# Patient Record
Sex: Female | Born: 1975
Health system: Southern US, Community
[De-identification: ages and names within clinical notes are randomized; demographics above are authoritative.]

## PROBLEM LIST (undated history)

## (undated) ENCOUNTER — Ambulatory Visit: Admission: EM | Payer: POS

## (undated) DIAGNOSIS — N870 Mild cervical dysplasia: Secondary | ICD-10-CM

## (undated) DIAGNOSIS — N051 Unspecified nephritic syndrome with focal and segmental glomerular lesions: Secondary | ICD-10-CM

## (undated) DIAGNOSIS — Z9889 Other specified postprocedural states: Secondary | ICD-10-CM

## (undated) DIAGNOSIS — N2581 Secondary hyperparathyroidism of renal origin: Secondary | ICD-10-CM

## (undated) DIAGNOSIS — N189 Chronic kidney disease, unspecified: Secondary | ICD-10-CM

## (undated) DIAGNOSIS — B20 Human immunodeficiency virus [HIV] disease: Secondary | ICD-10-CM

## (undated) DIAGNOSIS — G500 Trigeminal neuralgia: Secondary | ICD-10-CM

## (undated) DIAGNOSIS — Z21 Asymptomatic human immunodeficiency virus [HIV] infection status: Secondary | ICD-10-CM

## (undated) DIAGNOSIS — D631 Anemia in chronic kidney disease: Secondary | ICD-10-CM

## (undated) DIAGNOSIS — I10 Essential (primary) hypertension: Secondary | ICD-10-CM

## (undated) DIAGNOSIS — N261 Atrophy of kidney (terminal): Secondary | ICD-10-CM

## (undated) DIAGNOSIS — N901 Moderate vulvar dysplasia: Secondary | ICD-10-CM

## (undated) DIAGNOSIS — R002 Palpitations: Secondary | ICD-10-CM

## (undated) DIAGNOSIS — B370 Candidal stomatitis: Secondary | ICD-10-CM

## (undated) DIAGNOSIS — R000 Tachycardia, unspecified: Secondary | ICD-10-CM

## (undated) DIAGNOSIS — N186 End stage renal disease: Secondary | ICD-10-CM

## (undated) DIAGNOSIS — Z87412 Personal history of vulvar dysplasia: Secondary | ICD-10-CM

## (undated) HISTORY — PX: ANKLE FRACTURE SURGERY: SHX122

## (undated) HISTORY — PX: CERVIX SURGERY: SHX593

## (undated) HISTORY — DX: Chronic kidney disease, unspecified: N18.9

## (undated) HISTORY — DX: Trigeminal neuralgia: G50.0

## (undated) HISTORY — DX: Candidal stomatitis: B37.0

---

## 2003-08-07 HISTORY — PX: CERVICAL BIOPSY  W/ LOOP ELECTRODE EXCISION: SUR135

## 2010-08-06 HISTORY — PX: ORIF ANKLE FRACTURE: SUR919

## 2011-08-07 HISTORY — PX: VULVA SURGERY: SHX837

## 2018-09-21 ENCOUNTER — Emergency Department (HOSPITAL_COMMUNITY)
Admission: EM | Admit: 2018-09-21 | Discharge: 2018-09-21 | Disposition: A | Discharge status: Home / Self Care | Payer: Medicare Other | Attending: Emergency Medicine | Admitting: Emergency Medicine

## 2018-09-21 ENCOUNTER — Encounter (HOSPITAL_COMMUNITY): Payer: Self-pay | Admitting: Emergency Medicine

## 2018-09-21 ENCOUNTER — Other Ambulatory Visit: Payer: Self-pay

## 2018-09-21 DIAGNOSIS — G500 Trigeminal neuralgia: Secondary | ICD-10-CM | POA: Insufficient documentation

## 2018-09-21 DIAGNOSIS — R51 Headache: Secondary | ICD-10-CM | POA: Diagnosis present

## 2018-09-21 MED ORDER — CARBAMAZEPINE 200 MG PO TABS
200.0000 mg | ORAL_TABLET | Freq: Once | ORAL | Status: AC
  Administered 2018-09-21: 200 mg via ORAL
  Filled 2018-09-21: qty 1

## 2018-09-21 MED ORDER — CARBAMAZEPINE ER 100 MG PO TB12: 100.0000 mg | ORAL_TABLET | Freq: Two times a day (BID) | ORAL | 0 refills | Status: DC

## 2018-09-21 MED ORDER — OXYCODONE-ACETAMINOPHEN 5-325 MG PO TABS
1.0000 | ORAL_TABLET | Freq: Once | ORAL | Status: AC
  Administered 2018-09-21: 1 via ORAL
  Filled 2018-09-21: qty 1

## 2018-09-21 MED ORDER — IBUPROFEN 800 MG PO TABS: 800.0000 mg | ORAL_TABLET | Freq: Four times a day (QID) | ORAL | 0 refills | Status: DC | PRN

## 2018-09-21 NOTE — ED Provider Notes (Signed)
[facility] EMERGENCY DEPARTMENT Provider Note   CSN: [id number] Arrival date & time: 09/21/18  0131     History   Chief Complaint Chief Complaint  Patient presents with  . Facial Pain  . Otalgia    HPI Brooke Baldwin is a 43 y.o. female.  Patient presents to the emergency department for evaluation of right-sided facial pain.  Patient reports intermittent pain.  She states that she broke her jaw 10 years ago and has had intermittent pains on the right side of her face since then, but over the last couple of days she has been having severe pain.  Pain comes and goes.  When it happens it is like an electric shock that shoots down her face.  When pain is present it is 10 out of 10.  She has not identified anything that causes the pain, although applying Chapstick earlier caused the pain to happen.  Pain goes up into the right ear and down the right side of her face.  She does not have any teeth, no dental problems.  No hearing change.  No visual change.     History reviewed. No pertinent past medical history.  There are no active problems to display for this patient.   History reviewed. No pertinent surgical history.   OB History   No obstetric history on file.      Home Medications    Prior to Admission medications   Medication Sig Start Date End Date Taking? Authorizing Provider  carbamazepine (TEGRETOL-XR) 100 MG 12 hr tablet Take 1 tablet (100 mg total) by mouth 2 (two) times daily. 09/21/18   Orpah Greek, MD  ibuprofen (ADVIL,MOTRIN) 800 MG tablet Take 1 tablet (800 mg total) by mouth every 6 (six) hours as needed for moderate pain. 09/21/18   Orpah Greek, MD    Family History No family history on file.  Social History Social History   Tobacco Use  . Smoking status: Never Smoker  . Smokeless tobacco: Never Used  Substance Use Topics  . Alcohol use: Not Currently  . Drug use: Not Currently     Allergies     Patient has no allergy information on record.   Review of Systems Review of Systems  HENT: Positive for ear pain. Negative for facial swelling.   All other systems reviewed and are negative.    Physical Exam Updated Vital Signs BP (!) 188/112 (BP Location: Left Arm)   Pulse (!) 104   Temp 98.6 F (37 C) (Oral)   Resp 18   SpO2 93%   Physical Exam Vitals signs and nursing note reviewed.  Constitutional:      General: She is not in acute distress.    Appearance: Normal appearance. She is well-developed.  HENT:     Head: Normocephalic and atraumatic.     Right Ear: Hearing, tympanic membrane and ear canal normal.     Left Ear: Hearing normal.     Nose: Nose normal.  Eyes:     Conjunctiva/sclera: Conjunctivae normal.     Pupils: Pupils are equal, round, and reactive to light.  Neck:     Musculoskeletal: Normal range of motion and neck supple.  Cardiovascular:     Rate and Rhythm: Regular rhythm.     Heart sounds: S1 normal and S2 normal. No murmur. No friction rub. No gallop.   Pulmonary:     Effort: Pulmonary effort is normal. No respiratory distress.     Breath sounds: Normal  breath sounds.  Chest:     Chest wall: No tenderness.  Abdominal:     General: Bowel sounds are normal.     Palpations: Abdomen is soft.     Tenderness: There is no abdominal tenderness. There is no guarding or rebound. Negative signs include Murphy's sign and McBurney's sign.     Hernia: No hernia is present.  Musculoskeletal: Normal range of motion.  Skin:    General: Skin is warm and dry.     Findings: No rash.  Neurological:     Mental Status: She is alert and oriented to person, place, and time.     GCS: GCS eye subscore is 4. GCS verbal subscore is 5. GCS motor subscore is 6.     Cranial Nerves: No cranial nerve deficit.     Sensory: No sensory deficit.     Coordination: Coordination normal.  Psychiatric:        Speech: Speech normal.        Behavior: Behavior normal.         Thought Content: Thought content normal.      ED Treatments / Results  Labs (all labs ordered are listed, but only abnormal results are displayed) Labs Reviewed - No data to display  EKG None  Radiology No results found.  Procedures Procedures (including critical care time)  Medications Ordered in ED Medications  carbamazepine (TEGRETOL) tablet 200 mg (has no administration in time range)  oxyCODONE-acetaminophen (PERCOCET/ROXICET) 5-325 MG per tablet 1 tablet (has no administration in time range)     Initial Impression / Assessment and Plan / ED Course  I have reviewed the triage vital signs and the nursing notes.  Pertinent labs & imaging results that were available during my care of the patient were reviewed by me and considered in my medical decision making (see chart for details).     Patient presents to the emergency department for evaluation of facial pain.  Patient is experiencing intermittent sharp, stabbing lancinating pain.  There is no associated facial swelling.  She does not have any overlying skin changes or rash.  She does report ear pain but TM and canal are normal, no vesicles noted on exam.  No evidence of dental etiology.  No lymphadenopathy.  Symptoms are most consistent with a trigeminal neuralgia.  Treat as neuralgia, follow-up with neurology.  Final Clinical Impressions(s) / ED Diagnoses   Final diagnoses:  Trigeminal neuralgia of right side of face    ED Discharge Orders         Ordered    carbamazepine (TEGRETOL-XR) 100 MG 12 hr tablet  2 times daily     09/21/18 0332    ibuprofen (ADVIL,MOTRIN) 800 MG tablet  Every 6 hours PRN     09/21/18 0332           Orpah Greek, MD 09/21/18 [phone]

## 2018-09-21 NOTE — Discharge Instructions (Addendum)
Schedule follow-up with 1 of the listed neurology groups for further testing and treatment.

## 2018-09-21 NOTE — ED Notes (Signed)
Pt is tearful, states she broke her jaw 10 years ago and has had episodes of this pain since that time however never this severe.

## 2018-09-21 NOTE — ED Triage Notes (Signed)
C/o intermittently "shocking pain" to R side of face and R ear x 2-3 hours.

## 2019-04-02 ENCOUNTER — Encounter (HOSPITAL_COMMUNITY): Payer: Self-pay

## 2019-04-02 ENCOUNTER — Ambulatory Visit (HOSPITAL_COMMUNITY)
Admission: EM | Admit: 2019-04-02 | Discharge: 2019-04-02 | Disposition: A | Discharge status: Home / Self Care | Payer: Medicare Other | Attending: Internal Medicine | Admitting: Internal Medicine

## 2019-04-02 ENCOUNTER — Other Ambulatory Visit: Payer: Self-pay

## 2019-04-02 ENCOUNTER — Ambulatory Visit (INDEPENDENT_AMBULATORY_CARE_PROVIDER_SITE_OTHER): Payer: Medicare Other

## 2019-04-02 DIAGNOSIS — R030 Elevated blood-pressure reading, without diagnosis of hypertension: Secondary | ICD-10-CM

## 2019-04-02 DIAGNOSIS — J00 Acute nasopharyngitis [common cold]: Secondary | ICD-10-CM

## 2019-04-02 DIAGNOSIS — Z20828 Contact with and (suspected) exposure to other viral communicable diseases: Secondary | ICD-10-CM | POA: Diagnosis not present

## 2019-04-02 DIAGNOSIS — J209 Acute bronchitis, unspecified: Secondary | ICD-10-CM

## 2019-04-02 MED ORDER — BENZONATATE 100 MG PO CAPS: 100.0000 mg | ORAL_CAPSULE | Freq: Three times a day (TID) | ORAL | 0 refills | Status: DC | PRN

## 2019-04-02 NOTE — ED Triage Notes (Signed)
Pt here for COVID testing, states she had a cold 2 weeks ago and just started a new job, wants to make sure it is not COVID before she starts.

## 2019-04-02 NOTE — ED Provider Notes (Addendum)
[facility]    CSN: [id number] Arrival date & time: 04/02/19  1909      History   Chief Complaint Chief Complaint  Patient presents with  . COVID Test    HPI Brooke Baldwin is a 43 y.o. female with no past medical history comes to urgent care requesting covid 19 testing.  Patient has had runny nose, cough and discolored sputum over the past couple of weeks.  She denies any shortness of breath, wheezing, fever or chills.  No sick contacts.  She has a slight cough.  No nausea or vomiting.  No increasing fatigue, loss of taste or smell.  Patient recently started a new job and wants to be tested.   HPI  History reviewed. No pertinent past medical history.  There are no active problems to display for this patient.   History reviewed. No pertinent surgical history.  OB History   No obstetric history on file.      Home Medications    Prior to Admission medications   Medication Sig Start Date End Date Taking? Authorizing Provider  benzonatate (TESSALON) 100 MG capsule Take 1 capsule (100 mg total) by mouth 3 (three) times daily as needed for cough. 04/02/19   Madelyn Tlatelpa, Myrene Galas, MD  ibuprofen (ADVIL,MOTRIN) 800 MG tablet Take 1 tablet (800 mg total) by mouth every 6 (six) hours as needed for moderate pain. 09/21/18   Orpah Greek, MD  carbamazepine (TEGRETOL-XR) 100 MG 12 hr tablet Take 1 tablet (100 mg total) by mouth 2 (two) times daily. 09/21/18 04/02/19  Orpah Greek, MD    Family History Family History  Problem Relation Age of Onset  . Diabetes Mother   . Healthy Father     Social History Social History   Tobacco Use  . Smoking status: Never Smoker  . Smokeless tobacco: Never Used  Substance Use Topics  . Alcohol use: Not Currently  . Drug use: Not Currently     Allergies   Penicillins   Review of Systems Review of Systems  Constitutional: Negative for activity change, chills, fatigue and fever.  HENT: Negative.    Eyes: Negative.   Respiratory: Positive for cough. Negative for chest tightness, shortness of breath and wheezing.   Gastrointestinal: Negative for abdominal distention, diarrhea, nausea and vomiting.  Endocrine: Negative.   Genitourinary: Negative for dysuria, flank pain and genital sores.  Musculoskeletal: Negative for arthralgias, gait problem and neck stiffness.  Skin: Negative.      Physical Exam Triage Vital Signs ED Triage Vitals  Enc Vitals Group     BP 04/02/19 1939 (!) 151/101     Pulse Rate 04/02/19 1939 91     Resp 04/02/19 1939 17     Temp 04/02/19 1939 98.8 F (37.1 C)     Temp Source 04/02/19 1939 Oral     SpO2 04/02/19 1939 98 %     Weight --      Height --      Head Circumference --      Peak Flow --      Pain Score 04/02/19 1936 0     Pain Loc --      Pain Edu? --      Excl. in [place]? --    No data found.  Updated Vital Signs BP (!) 151/101 (BP Location: Left Arm)   Pulse 91   Temp 98.8 F (37.1 C) (Oral)   Resp 17   LMP 04/02/2015 (Approximate)   SpO2 98%  Visual Acuity Right Eye Distance:   Left Eye Distance:   Bilateral Distance:    Right Eye Near:   Left Eye Near:    Bilateral Near:     Physical Exam Constitutional:      Appearance: She is not ill-appearing or toxic-appearing.  Cardiovascular:     Rate and Rhythm: Normal rate and regular rhythm.     Pulses: Normal pulses.     Heart sounds: Normal heart sounds.  Pulmonary:     Effort: Pulmonary effort is normal. No respiratory distress.     Breath sounds: Rhonchi present. No rales.  Abdominal:     General: Bowel sounds are normal. There is no distension.     Palpations: Abdomen is soft.     Tenderness: There is no abdominal tenderness. There is no rebound.  Musculoskeletal: Normal range of motion.  Skin:    General: Skin is warm.     Capillary Refill: Capillary refill takes less than 2 seconds.     Coloration: Skin is not jaundiced.     Findings: No bruising.  Neurological:      General: No focal deficit present.     Mental Status: She is alert and oriented to person, place, and time.      UC Treatments / Results  Labs (all labs ordered are listed, but only abnormal results are displayed) Labs Reviewed  NOVEL CORONAVIRUS, NAA (HOSP ORDER, SEND-OUT TO REF LAB; TAT 18-24 HRS)    EKG   Radiology No results found.  Procedures Procedures (including critical care time)  Medications Ordered in UC Medications - No data to display  Initial Impression / Assessment and Plan / UC Course  I have reviewed the triage vital signs and the nursing notes.  Pertinent labs & imaging results that were available during my care of the patient were reviewed by me and considered in my medical decision making (see chart for details).     1.  Acute bronchitis likely viral: Chest x-ray is negative for acute lung infiltrate COVID-19 testing Patient advised to self isolate until the COVID-19 test results available. Tessalon Perles as needed for cough If patient symptoms worsens she is advised to come to the urgent care to be reevaluated.  2.  Elevated blood pressure without diagnosis of hypertension: Patient is advised to monitor blood pressure at home on a regular basis. If blood pressure remains elevated over the course of the next several weeks she may benefit from antihypertensive agents.  At this time antihypertensive treatments have been prescribed.  Patient is advised to increase physical activity in trying to lose some weight as well as decreased oral salt intake.  Final Clinical Impressions(s) / UC Diagnoses   Final diagnoses:  Common cold   Discharge Instructions   None    ED Prescriptions    Medication Sig Dispense Auth. Provider   benzonatate (TESSALON) 100 MG capsule Take 1 capsule (100 mg total) by mouth 3 (three) times daily as needed for cough. 40 capsule Jacklynn Dehaas, Myrene Galas, MD     Controlled Substance Prescriptions [place] Controlled Substance  Registry consulted? No   Chase Picket, MD 04/06/19 3254    Chase Picket, MD 04/06/19 [phone]

## 2019-04-04 LAB — NOVEL CORONAVIRUS, NAA (HOSP ORDER, SEND-OUT TO REF LAB; TAT 18-24 HRS): SARS-CoV-2, NAA: NOT DETECTED

## 2019-04-06 ENCOUNTER — Encounter (HOSPITAL_COMMUNITY): Payer: Self-pay

## 2019-10-03 ENCOUNTER — Ambulatory Visit (HOSPITAL_COMMUNITY)
Admission: EM | Admit: 2019-10-03 | Discharge: 2019-10-03 | Disposition: A | Discharge status: Home / Self Care | Payer: Medicare Other | Attending: Physician Assistant | Admitting: Physician Assistant

## 2019-10-03 ENCOUNTER — Other Ambulatory Visit: Payer: Self-pay

## 2019-10-03 ENCOUNTER — Encounter (HOSPITAL_COMMUNITY): Payer: Self-pay

## 2019-10-03 DIAGNOSIS — G500 Trigeminal neuralgia: Secondary | ICD-10-CM

## 2019-10-03 MED ORDER — CARBAMAZEPINE ER 100 MG PO TB12: 100.0000 mg | ORAL_TABLET | Freq: Two times a day (BID) | ORAL | 1 refills | Status: DC

## 2019-10-03 NOTE — Discharge Instructions (Signed)
Send in the prescription for carbamazepine would like you to start taking this 1 tablet 2 times a day.  Lease follow-up with your primary care for refills and continued maintenance.  If pain worsens and you do not have improvement please request earlier follow-up with your primary care or return this clinic

## 2019-10-03 NOTE — ED Provider Notes (Signed)
[facility]    CSN: [id number] Arrival date & time: 10/03/19  1605      History   Chief Complaint Chief Complaint  Patient presents with  . Medication Refill    HPI Brooke Baldwin is a 44 y.o. female.   Patient with a past medical history of trigeminal neuralgia presents to urgent care with acute flare of right-sided facial pain.  She reports being out of her carbamazepine for some time.  She reports this is due to moving from [place] and having to switch primary care providers to local however Covid had prevented her from being able to get seen and refilled.  She reports her symptoms are very consistent with her previous flares.  She reports shooting pain down the right side of her face starting just behind her ear.  She reports the pain is significant enough to cause tears and is worse with certain movements of her jaw and certain words.  Denies numbness or tingling in the face.  She denies other symptoms.     History reviewed. No pertinent past medical history.  There are no problems to display for this patient.   History reviewed. No pertinent surgical history.  OB History   No obstetric history on file.      Home Medications    Prior to Admission medications   Medication Sig Start Date End Date Taking? Authorizing Provider  benzonatate (TESSALON) 100 MG capsule Take 1 capsule (100 mg total) by mouth 3 (three) times daily as needed for cough. 04/02/19   Chase Picket, MD  carbamazepine (TEGRETOL-XR) 100 MG 12 hr tablet Take 1 tablet (100 mg total) by mouth 2 (two) times daily. 10/03/19 12/02/19  Nicholous Girgenti, Marguerita Beards, PA-C  ibuprofen (ADVIL,MOTRIN) 800 MG tablet Take 1 tablet (800 mg total) by mouth every 6 (six) hours as needed for moderate pain. 09/21/18   Orpah Greek, MD    Family History Family History  Problem Relation Age of Onset  . Diabetes Mother   . Healthy Father     Social History Social History   Tobacco Use  . Smoking  status: Never Smoker  . Smokeless tobacco: Never Used  Substance Use Topics  . Alcohol use: Not Currently  . Drug use: Not Currently     Allergies   Penicillins   Review of Systems Review of Systems  Constitutional: Negative for chills and fever.  Eyes: Negative for photophobia, pain and visual disturbance.  Musculoskeletal:       Facial pain  Skin: Negative for color change and rash.  Neurological: Positive for speech difficulty. Negative for dizziness, seizures, facial asymmetry, weakness, numbness and headaches.  All other systems reviewed and are negative.    Physical Exam Triage Vital Signs ED Triage Vitals  Enc Vitals Group     BP 10/03/19 1644 (!) 167/126     Pulse Rate 10/03/19 1644 93     Resp 10/03/19 1644 18     Temp 10/03/19 1644 99 F (37.2 C)     Temp Source 10/03/19 1644 Oral     SpO2 10/03/19 1644 100 %     Weight --      Height --      Head Circumference --      Peak Flow --      Pain Score 10/03/19 1643 8     Pain Loc --      Pain Edu? --      Excl. in [place]? --  No data found.  Updated Vital Signs BP (!) 167/126 (BP Location: Right Arm)   Pulse 93   Temp 99 F (37.2 C) (Oral)   Resp 18   LMP 04/02/2015 (Approximate)   SpO2 100%   Visual Acuity Right Eye Distance:   Left Eye Distance:   Bilateral Distance:    Right Eye Near:   Left Eye Near:    Bilateral Near:     Physical Exam Vitals and nursing note reviewed.  Constitutional:      General: She is not in acute distress.    Appearance: She is well-developed.  HENT:     Head: Normocephalic and atraumatic.     Comments: Patient with tenderness to palpation of the right side of the face.  Patient with pain movement of the muscles of mastication and facial muscles.    Nose: Nose normal.     Mouth/Throat:     Mouth: Mucous membranes are moist.     Pharynx: Oropharynx is clear.  Eyes:     Conjunctiva/sclera: Conjunctivae normal.  Cardiovascular:     Rate and Rhythm: Normal  rate.  Pulmonary:     Effort: Pulmonary effort is normal. No respiratory distress.     Breath sounds: Normal breath sounds.  Musculoskeletal:     Cervical back: Normal range of motion and neck supple.  Lymphadenopathy:     Cervical: No cervical adenopathy.  Skin:    General: Skin is warm and dry.  Neurological:     General: No focal deficit present.     Mental Status: She is alert.     Comments: Her speech is altered due to mentation of movement of the jaw and facial muscles secondary to pain  Psychiatric:        Mood and Affect: Mood normal.        Behavior: Behavior normal.        Thought Content: Thought content normal.        Judgment: Judgment normal.      UC Treatments / Results  Labs (all labs ordered are listed, but only abnormal results are displayed) Labs Reviewed - No data to display  EKG   Radiology No results found.  Procedures Procedures (including critical care time)  Medications Ordered in UC Medications - No data to display  Initial Impression / Assessment and Plan / UC Course  I have reviewed the triage vital signs and the nursing notes.  Pertinent labs & imaging results that were available during my care of the patient were reviewed by me and considered in my medical decision making (see chart for details).  Chart review conducted a note from February 2020 emergency department trip reviewed    #Trigeminal neuralgia Patient is a 45 year old female with past medical history of trigeminal neuralgia presenting today with acute flare.  Patient has been out of her carbamazepine.  She reports this works well for her.  She has a scheduled primary care appointment on March 20 to establish care.  She reports she will be referred to neurology at that point.  Given this is consistent with her previous presentation which was reviewed in the chart, I will prescribe carbamazepine extended release 100 mg twice daily.  She reports this was was prescribed previously  and worked well for instructed that she needs to follow-up with her primary care for continued maintenance therapy. Final Clinical Impressions(s) / UC Diagnoses   Final diagnoses:  Trigeminal neuralgia     Discharge Instructions     Send  in the prescription for carbamazepine would like you to start taking this 1 tablet 2 times a day.  Lease follow-up with your primary care for refills and continued maintenance.  If pain worsens and you do not have improvement please request earlier follow-up with your primary care or return this clinic      ED Prescriptions    Medication Sig Dispense Auth. Provider   carbamazepine (TEGRETOL-XR) 100 MG 12 hr tablet Take 1 tablet (100 mg total) by mouth 2 (two) times daily. 60 tablet Navina Wohlers, Marguerita Beards, PA-C     PDMP not reviewed this encounter.   Purnell Shoemaker, PA-C 10/03/19 2126

## 2019-10-03 NOTE — ED Triage Notes (Signed)
Pt came in to get a refill on her prescription.carbamazepine ER 100mg 

## 2019-11-20 ENCOUNTER — Ambulatory Visit (HOSPITAL_COMMUNITY)
Admission: EM | Admit: 2019-11-20 | Discharge: 2019-11-20 | Disposition: A | Discharge status: Home / Self Care | Payer: Medicare Other

## 2019-11-20 ENCOUNTER — Encounter (HOSPITAL_COMMUNITY): Payer: Self-pay

## 2019-11-20 ENCOUNTER — Other Ambulatory Visit: Payer: Self-pay

## 2019-11-20 DIAGNOSIS — H1032 Unspecified acute conjunctivitis, left eye: Secondary | ICD-10-CM

## 2019-11-20 MED ORDER — POLYMYXIN B-TRIMETHOPRIM 10000-0.1 UNIT/ML-% OP SOLN: 1.0000 [drp] | OPHTHALMIC | 0 refills | Status: DC

## 2019-11-20 MED ORDER — OLOPATADINE HCL 0.1 % OP SOLN: 1.0000 [drp] | Freq: Two times a day (BID) | OPHTHALMIC | 0 refills | Status: DC

## 2019-11-20 NOTE — ED Provider Notes (Signed)
[place]    CSN: [id number] Arrival date & time: 11/20/19  1113      History   Chief Complaint Chief Complaint  Patient presents with  . Conjunctivitis    HPI Brooke Baldwin is a 44 y.o. female no significant past medical history presenting today for evaluation of left eye redness and irritation.  Patient notes that over the past week she has had allergy symptoms in her eyes with itching.  She notes that she has been rubbing her eye a lot.  Over the past couple days she has developed increased redness as well as drainage has turned from watery to a thicker discharge.  She denies photophobia.  Denies discomfort with blinking.  Denies changes in vision.  Denies contact use.  Has had some mild rhinorrhea associated with her allergies, but denies cough sore throat or ear pain.  Denies fevers.  HPI  History reviewed. No pertinent past medical history.  There are no problems to display for this patient.   History reviewed. No pertinent surgical history.  OB History   No obstetric history on file.      Home Medications    Prior to Admission medications   Medication Sig Start Date End Date Taking? Authorizing Provider  diclofenac (VOLTAREN) 75 MG EC tablet Take by mouth. 05/16/19  Yes [provider]  naproxen (NAPROSYN) 500 MG tablet Take by mouth. 08/24/19  Yes [provider]  amLODipine (NORVASC) 10 MG tablet amlodipine 10 mg tablet    [provider]  benzonatate (TESSALON) 100 MG capsule Take 1 capsule (100 mg total) by mouth 3 (three) times daily as needed for cough. 04/02/19   Chase Picket, MD  carbamazepine (TEGRETOL-XR) 100 MG 12 hr tablet Take 1 tablet (100 mg total) by mouth 2 (two) times daily. 10/03/19 12/02/19  Darr, Marguerita Beards, PA-C  clindamycin (CLEOCIN) 300 MG capsule clindamycin HCl 300 mg capsule    [provider]  ergocalciferol (VITAMIN D2) 1.25 MG (50000 UT) capsule ergocalciferol (vitamin D2) 1,250 mcg  (50,000 unit) capsule  TK 1 C PO WEEKLY    [provider]  fluconazole (DIFLUCAN) 150 MG tablet fluconazole 150 mg tablet    [provider]  ibuprofen (ADVIL,MOTRIN) 800 MG tablet Take 1 tablet (800 mg total) by mouth every 6 (six) hours as needed for moderate pain. 09/21/18   Orpah Greek, MD  lopinavir-ritonavir (KALETRA) 200-50 MG tablet Take by mouth.    [provider]  moxifloxacin (VIGAMOX) 0.5 % ophthalmic solution moxifloxacin 0.5 % eye drops    [provider]  nystatin (MYCOSTATIN) 100000 UNIT/ML suspension nystatin 100,000 unit/mL oral suspension    [provider]  olopatadine (PATANOL) 0.1 % ophthalmic solution Place 1 drop into both eyes 2 (two) times daily. 11/20/19   Esmeralda Malay C, PA-C  raltegravir (ISENTRESS) 400 MG tablet Take by mouth.    [provider]  tobramycin (TOBREX) 0.3 % ophthalmic solution tobramycin 0.3 % eye drops    [provider]  trimethoprim-polymyxin b (POLYTRIM) ophthalmic solution Place 1 drop into both eyes every 4 (four) hours. 11/20/19   Emry Barbato, Elesa Hacker, PA-C    Family History Family History  Problem Relation Age of Onset  . Diabetes Mother   . Healthy Father     Social History Social History   Tobacco Use  . Smoking status: Never Smoker  . Smokeless tobacco: Never Used  Substance Use Topics  . Alcohol use: Yes  . Drug  use: Not Currently     Allergies   Penicillins   Review of Systems Review of Systems  Constitutional: Negative for activity change, appetite change, chills, fatigue and fever.  HENT: Positive for rhinorrhea. Negative for congestion, ear pain, sinus pressure, sore throat and trouble swallowing.   Eyes: Positive for discharge, redness and itching. Negative for photophobia, pain and visual disturbance.  Respiratory: Negative for cough, chest tightness and shortness of breath.   Cardiovascular: Negative for chest pain.  Gastrointestinal:  Negative for abdominal pain, diarrhea, nausea and vomiting.  Musculoskeletal: Negative for myalgias.  Skin: Negative for rash.  Neurological: Negative for dizziness, light-headedness and headaches.     Physical Exam Triage Vital Signs ED Triage Vitals  Enc Vitals Group     BP 11/20/19 1149 (!) 155/104     Pulse Rate 11/20/19 1149 89     Resp 11/20/19 1149 19     Temp 11/20/19 1149 98.2 F (36.8 C)     Temp Source 11/20/19 1149 Oral     SpO2 11/20/19 1149 100 %     Weight 11/20/19 1144 233 lb (105.7 kg)     Height --      Head Circumference --      Peak Flow --      Pain Score 11/20/19 1144 0     Pain Loc --      Pain Edu? --      Excl. in [place]? --    No data found.  Updated Vital Signs BP (!) 155/104 (BP Location: Right Arm) Comment: not taken BP meds  Pulse 89   Temp 98.2 F (36.8 C) (Oral)   Resp 19   Wt 233 lb (105.7 kg)   LMP 04/02/2015 (Approximate)   SpO2 100%   Visual Acuity Right Eye Distance:  20/100 Left Eye Distance:  20/30 Bilateral Distance:    Right Eye Near:   Left Eye Near:    Bilateral Near:     Physical Exam Vitals and nursing note reviewed.  Constitutional:      Appearance: She is well-developed.     Comments: No acute distress  HENT:     Head: Normocephalic and atraumatic.     Ears:     Comments: Bilateral ears without tenderness to palpation of external auricle, tragus and mastoid, EAC's without erythema or swelling, TM's with good bony landmarks and cone of light. Non erythematous.     Nose: Nose normal.     Mouth/Throat:     Comments: Oral mucosa pink and moist, no tonsillar enlargement or exudate. Posterior pharynx patent and nonerythematous, no uvula deviation or swelling. Normal phonation. Eyes:     Extraocular Movements: Extraocular movements intact.     Pupils: Pupils are equal, round, and reactive to light.     Comments: Left eye with conjunctival erythema, no photophobia with exam, no discharge noted  Cardiovascular:      Rate and Rhythm: Normal rate.  Pulmonary:     Effort: Pulmonary effort is normal. No respiratory distress.  Abdominal:     General: There is no distension.  Musculoskeletal:        General: Normal range of motion.     Cervical back: Neck supple.  Skin:    General: Skin is warm and dry.  Neurological:     Mental Status: She is alert and oriented to person, place, and time.      UC Treatments / Results  Labs (all labs ordered are listed, but only abnormal results are  displayed) Labs Reviewed - No data to display  EKG   Radiology No results found.  Procedures Procedures (including critical care time)  Medications Ordered in UC Medications - No data to display  Initial Impression / Assessment and Plan / UC Course  I have reviewed the triage vital signs and the nursing notes.  Pertinent labs & imaging results that were available during my care of the patient were reviewed by me and considered in my medical decision making (see chart for details).     Visual acuity intact in affected eye, does have poor visual acuity and normal eye.  Exam suggestive of likely conjunctivitis, possible allergic with secondary bacterial infection from frequent rubbing of eye.  Do not suspect corneal abrasion at this time.  Treating with Polytrim and olopatadine.  Continue to monitor,Discussed strict return precautions. Patient verbalized understanding and is agreeable with plan.   Final Clinical Impressions(s) / UC Diagnoses   Final diagnoses:  Acute conjunctivitis of left eye, unspecified acute conjunctivitis type     Discharge Instructions     Please use olopatadine eyedrops twice daily as needed for itching/allergy symptoms Please use Polytrim eyedrops to treat for infection and I Avoid touching eye, wash hands after touching eye May try warm compresses  Please follow-up if any symptoms not improving or worsening, developing eye pain, light sensitivity, swelling, changes in  vision   ED Prescriptions    Medication Sig Dispense Auth. Provider   trimethoprim-polymyxin b (POLYTRIM) ophthalmic solution Place 1 drop into both eyes every 4 (four) hours. 10 mL Harout Scheurich C, PA-C   olopatadine (PATANOL) 0.1 % ophthalmic solution Place 1 drop into both eyes 2 (two) times daily. 5 mL Amarrah Meinhart, [place] C, PA-C     PDMP not reviewed this encounter.   Janith Lima, PA-C 11/20/19 1318

## 2019-11-20 NOTE — ED Triage Notes (Signed)
Pt is here with an irritated left eye after constantly rubbing it, states she has a hx of allergies too. Pt has not taken anything to relieve discomfort.

## 2019-11-20 NOTE — Discharge Instructions (Signed)
Please use olopatadine eyedrops twice daily as needed for itching/allergy symptoms Please use Polytrim eyedrops to treat for infection and I Avoid touching eye, wash hands after touching eye May try warm compresses  Please follow-up if any symptoms not improving or worsening, developing eye pain, light sensitivity, swelling, changes in vision

## 2019-12-04 ENCOUNTER — Other Ambulatory Visit: Payer: Self-pay

## 2019-12-04 ENCOUNTER — Encounter (HOSPITAL_COMMUNITY): Payer: Self-pay | Admitting: Emergency Medicine

## 2019-12-04 ENCOUNTER — Emergency Department (HOSPITAL_COMMUNITY)
Admission: EM | Admit: 2019-12-04 | Discharge: 2019-12-04 | Disposition: A | Discharge status: Home / Self Care | Payer: Medicare Other | Attending: Emergency Medicine | Admitting: Emergency Medicine

## 2019-12-04 DIAGNOSIS — G500 Trigeminal neuralgia: Secondary | ICD-10-CM | POA: Diagnosis not present

## 2019-12-04 DIAGNOSIS — E519 Thiamine deficiency, unspecified: Secondary | ICD-10-CM | POA: Diagnosis present

## 2019-12-04 DIAGNOSIS — Z79899 Other long term (current) drug therapy: Secondary | ICD-10-CM | POA: Insufficient documentation

## 2019-12-04 MED ORDER — ONDANSETRON 4 MG PO TBDP
4.0000 mg | ORAL_TABLET | Freq: Once | ORAL | Status: AC
  Administered 2019-12-04: 16:00:00 4 mg via ORAL
  Filled 2019-12-04: qty 1

## 2019-12-04 MED ORDER — CELECOXIB 200 MG PO CAPS: 200.0000 mg | ORAL_CAPSULE | Freq: Two times a day (BID) | ORAL | 0 refills | Status: DC

## 2019-12-04 MED ORDER — HYDROCODONE-ACETAMINOPHEN 5-325 MG PO TABS
1.0000 | ORAL_TABLET | Freq: Once | ORAL | Status: AC
  Administered 2019-12-04: 1 via ORAL
  Filled 2019-12-04: qty 1

## 2019-12-04 MED ORDER — BACLOFEN 10 MG PO TABS: 10.0000 mg | ORAL_TABLET | Freq: Three times a day (TID) | ORAL | 0 refills | Status: DC | PRN

## 2019-12-04 NOTE — ED Provider Notes (Signed)
[facility] EMERGENCY DEPARTMENT Provider Note   CSN: [id number] Arrival date & time: 12/04/19  1330     History Chief Complaint  Patient presents with  . Facial Pain    Brooke Baldwin is a 44 y.o. female.  Female who presents the emergency department with complaint of trigeminal neuralgia pain.  She has had greater than 6 weeks of right-sided facial pain which is made worse with swallowing, drinking through a straw, sneezing.  She states that she is in constant agony, feel like she has a taser held to her face.  She is tearful and states that she feels extremely depressed because she feels like she just cannot even do anything because of her pain.  She has been taking carbamazepine without relief.  She tried to make an appointment with neurology who told her she needed another referral.  She denies abnormal facial movement.  HPI     History reviewed. No pertinent past medical history.  There are no problems to display for this patient.   History reviewed. No pertinent surgical history.   OB History   No obstetric history on file.     Family History  Problem Relation Age of Onset  . Diabetes Mother   . Healthy Father     Social History   Tobacco Use  . Smoking status: Never Smoker  . Smokeless tobacco: Never Used  Substance Use Topics  . Alcohol use: Yes  . Drug use: Not Currently    Home Medications Prior to Admission medications   Medication Sig Start Date End Date Taking? Authorizing Provider  amLODipine (NORVASC) 10 MG tablet amlodipine 10 mg tablet    [provider]  benzonatate (TESSALON) 100 MG capsule Take 1 capsule (100 mg total) by mouth 3 (three) times daily as needed for cough. 04/02/19   Chase Picket, MD  carbamazepine (TEGRETOL-XR) 100 MG 12 hr tablet Take 1 tablet (100 mg total) by mouth 2 (two) times daily. 10/03/19 12/02/19  Darr, Marguerita Beards, PA-C  clindamycin (CLEOCIN) 300 MG capsule clindamycin HCl 300 mg  capsule    [provider]  diclofenac (VOLTAREN) 75 MG EC tablet Take by mouth. 05/16/19   [provider]  ergocalciferol (VITAMIN D2) 1.25 MG (50000 UT) capsule ergocalciferol (vitamin D2) 1,250 mcg (50,000 unit) capsule  TK 1 C PO WEEKLY    [provider]  fluconazole (DIFLUCAN) 150 MG tablet fluconazole 150 mg tablet    [provider]  ibuprofen (ADVIL,MOTRIN) 800 MG tablet Take 1 tablet (800 mg total) by mouth every 6 (six) hours as needed for moderate pain. 09/21/18   Orpah Greek, MD  lopinavir-ritonavir (KALETRA) 200-50 MG tablet Take by mouth.    [provider]  moxifloxacin (VIGAMOX) 0.5 % ophthalmic solution moxifloxacin 0.5 % eye drops    [provider]  naproxen (NAPROSYN) 500 MG tablet Take by mouth. 08/24/19   [provider]  nystatin (MYCOSTATIN) 100000 UNIT/ML suspension nystatin 100,000 unit/mL oral suspension    [provider]  olopatadine (PATANOL) 0.1 % ophthalmic solution Place 1 drop into both eyes 2 (two) times daily. 11/20/19   Wieters, Hallie C, PA-C  raltegravir (ISENTRESS) 400 MG tablet Take by mouth.    [provider]  tobramycin (TOBREX) 0.3 % ophthalmic solution tobramycin 0.3 % eye drops    [provider]  trimethoprim-polymyxin b (POLYTRIM) ophthalmic solution Place 1 drop into both eyes every 4 (four) hours. 11/20/19   Wieters, Elesa Hacker,  PA-C    Allergies    Penicillins  Review of Systems   Review of Systems   Ten systems reviewed and are negative for acute change, except as noted in the HPI.   Physical Exam Updated Vital Signs BP (!) 169/105   Pulse (!) 108   Temp 98.6 F (37 C) (Oral)   Resp 18   Ht 5\' 2"  (1.575 m)   Wt 103.4 kg   LMP 04/02/2015 (Approximate)   SpO2 99%   BMI 41.70 kg/m   Physical Exam Vitals and nursing note reviewed.  Constitutional:      General: She is not in acute distress.    Appearance: She is well-developed. She  is not diaphoretic.  HENT:     Head: Normocephalic and atraumatic.  Eyes:     General: No scleral icterus.    Conjunctiva/sclera: Conjunctivae normal.  Cardiovascular:     Rate and Rhythm: Normal rate and regular rhythm.     Heart sounds: Normal heart sounds. No murmur. No friction rub. No gallop.   Pulmonary:     Effort: Pulmonary effort is normal. No respiratory distress.     Breath sounds: Normal breath sounds.  Abdominal:     General: Bowel sounds are normal. There is no distension.     Palpations: Abdomen is soft. There is no mass.     Tenderness: There is no abdominal tenderness. There is no guarding.  Musculoskeletal:     Cervical back: Normal range of motion.  Skin:    General: Skin is warm and dry.  Neurological:     General: No focal deficit present.     Mental Status: She is alert and oriented to person, place, and time.  Psychiatric:        Mood and Affect: Affect is tearful.        Behavior: Behavior normal.     ED Results / Procedures / Treatments   Labs (all labs ordered are listed, but only abnormal results are displayed) Labs Reviewed - No data to display  EKG None  Radiology No results found.  Procedures Procedures (including critical care time)  Medications Ordered in ED Medications - No data to display  ED Course  I have reviewed the triage vital signs and the nursing notes.  Pertinent labs & imaging results that were available during my care of the patient were reviewed by me and considered in my medical decision making (see chart for details).    MDM Rules/Calculators/A&P                      This is a 44 year old female with trigeminal neuralgia.  She has been having severe pain on the right side of her face for greater than 6 weeks and is still having severe discomfort.  I discussed the case with Dr. Lorraine Lax from neurology who suggested adding on baclofen or gabapentin.  I have also ordered NSAID therapy with hopes to give her some relief.   I have given the patient an ambulatory referral to neurology to be seen within the week.  Patient appears otherwise appropriate for discharge at this time.  She has no neurologic deficits, rashes or other abnormalities. Final Clinical Impression(s) / ED Diagnoses Final diagnoses:  None    Rx / DC Orders ED Discharge Orders         Ordered    Ambulatory referral to Neurology    Comments: An appointment is requested in approximately: 1 week   12/04/19  [place], [place], PA-C 12/04/19 [place], Wil[place], DO 12/04/19 1627

## 2019-12-04 NOTE — ED Triage Notes (Signed)
Pt states hx of trigeminal neuralgia last year, reports pain to R side of face that began about 6 weeks ago. States she went to urgent care a few weeks ago and got medications but they are not working. She contacted the neurologist that was listed as a referral on her original paperwork but they stated she had to have a more recent referral. She reports difficulty moving the R side of her face due to pain.

## 2019-12-04 NOTE — Discharge Instructions (Signed)
Contact a health care provider if: Your medicine is not helping your symptoms. You have side effects from the medicine used for treatment. You develop new, unexplained symptoms, such as: Double vision. Facial weakness. Facial numbness. Changes in hearing or balance. You feel depressed. Get help right away if: Your pain is severe and is not getting better. You develop suicidal thoughts.

## 2019-12-08 ENCOUNTER — Ambulatory Visit: Payer: Medicare Other | Admitting: Neurology

## 2019-12-09 ENCOUNTER — Encounter: Payer: Self-pay | Admitting: *Deleted

## 2019-12-09 ENCOUNTER — Other Ambulatory Visit: Payer: Self-pay

## 2019-12-09 ENCOUNTER — Telehealth: Payer: Self-pay | Admitting: Neurology

## 2019-12-09 ENCOUNTER — Encounter: Payer: Self-pay | Admitting: Neurology

## 2019-12-09 ENCOUNTER — Ambulatory Visit (INDEPENDENT_AMBULATORY_CARE_PROVIDER_SITE_OTHER): Payer: Medicare Other | Admitting: Neurology

## 2019-12-09 VITALS — BP 155/99 | HR 102 | Temp 97.2°F | Ht 62.0 in | Wt 225.5 lb

## 2019-12-09 DIAGNOSIS — G500 Trigeminal neuralgia: Secondary | ICD-10-CM

## 2019-12-09 MED ORDER — OXCARBAZEPINE 150 MG PO TABS: 300.0000 mg | ORAL_TABLET | Freq: Two times a day (BID) | ORAL | 6 refills | Status: DC

## 2019-12-09 MED ORDER — GABAPENTIN 300 MG PO CAPS: 600.0000 mg | ORAL_CAPSULE | Freq: Three times a day (TID) | ORAL | 6 refills | Status: DC

## 2019-12-09 NOTE — Progress Notes (Signed)
PATIENT: Brooke Baldwin DOB: 1975/10/16  Chief Complaint  Patient presents with  . Trigeminal Neuralgia    She is here following ED visit for right-sided trigeminal neuralgia pain. She has been taking carbamazepine 100mg  BID for the last 6+ weeks without relieft. She was given celecoxib and baclofen at the ED. She has not tried the NSAID. States baclofen caused intolerable dizziness. Says she was given hydrocodone while at the hospital but it was unhelpful.   Marland Kitchen PCP    No established PCP. Referred from ED.     HISTORICAL  Brooke Baldwin is a 44 year old female, seen in request by emergency room for evaluation of right trigeminal neuralgia, initial evaluation was on Dec 09, 2019.  I have reviewed and summarized the referring note from the referring physician.  She moved from [place] to [place] in November 2019.  Her symptoms started in early 2019, initially she thought she was experienced dental problem, has intermittent radiating pain from right temporal area to right lower jaw, but diagnosis was never made, her symptoms was transient,  Since she moved to [place], she presented to emergency room multiple times for similar complaints, in February 2020, she experienced her first severe episode, daily severe radiating pain to right V3 distribution, she was diagnosed with right trigeminal neuralgia, was given prescription of Tegretol 100 mg twice daily, helped her symptoms within 1 week, only has occasionally recurrent radiating pain while she was taking 100 mg twice daily, eventually disappeared, she stopped taking Tegretol around June 2020.  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  REVIEW OF SYSTEMS: Full 14 system review of systems performed and notable only for as above All other review of systems were negative.  ALLERGIES: Allergies  Allergen Reactions  . Baclofen Other (See Comments)    Caused intolerable dizziness   . Penicillins Other (See Comments)    Unknown childhood reaction     HOME MEDICATIONS: Current Outpatient Medications  Medication Sig Dispense Refill  . carbamazepine (TEGRETOL-XR) 100 MG 12 hr tablet Take 1 tablet (100 mg total) by mouth 2 (two) times daily. 60 tablet 1  . celecoxib (CELEBREX) 200 MG capsule Take 1 capsule (200 mg total) by mouth 2 (two) times daily. 20 capsule 0   No current facility-administered medications for this visit.    PAST MEDICAL HISTORY: Past Medical History:  Diagnosis Date  . Trigeminal neuralgia     PAST SURGICAL HISTORY: Past Surgical History:  Procedure Laterality Date  . ANKLE FRACTURE SURGERY Right   . CERVIX SURGERY     removal of HPV warts    FAMILY HISTORY: Family History  Problem Relation Age of Onset  . Diabetes Mother   . Healthy Father     SOCIAL HISTORY: Social History   Socioeconomic History  . Marital status: Single    Spouse name: Not  on file  . Number of children: 0  . Years of education: college  . Highest education level: Bachelor's degree (e.g., BA, AB, BS)  Occupational History  . Occupation: Administrator, arts  Tobacco Use  . Smoking status: Never Smoker  . Smokeless tobacco: Never Used  Substance and Sexual Activity  . Alcohol use: Yes    Comment: occasional  . Drug use: Not Currently  . Sexual activity: Not Currently     Birth control/protection: None  Other Topics Concern  . Not on file  Social History Narrative   Lives at home with significant other.   Right-handed.   No daily caffeine use.   Social Determinants of Health   Financial Resource Strain:   . Difficulty of Paying Living Expenses:   Food Insecurity:   . Worried About Charity fundraiser in the Last Year:   . Arboriculturist in the Last Year:   Transportation Needs:   . Film/video editor (Medical):   Marland Kitchen Lack of Transportation (Non-Medical):   Physical Activity:   . Days of Exercise per Week:   . Minutes of Exercise per Session:   Stress:   . Feeling of Stress :   Social Connections:   . Frequency of Communication with Friends and Family:   . Frequency of Social Gatherings with Friends and Family:   . Attends Religious Services:   . Active Member of Clubs or Organizations:   . Attends Archivist Meetings:   Marland Kitchen Marital Status:   Intimate Partner Violence:   . Fear of Current or Ex-Partner:   . Emotionally Abused:   Marland Kitchen Physically Abused:   . Sexually Abused:      PHYSICAL EXAM   Vitals:   12/09/19 0816  BP: (!) 155/99  Pulse: (!) 102  Temp: (!) 97.2 F (36.2 C)  Weight: 225 lb 8 oz (102.3 kg)  Height: 5\' 2"  (1.575 m)    Not recorded      Body mass index is 41.24 kg/m.  PHYSICAL EXAMNIATION:  Gen: NAD, conversant, well nourised, well groomed                     Cardiovascular: Regular rate rhythm, no peripheral edema, warm, nontender. Eyes: Conjunctivae clear without exudates or hemorrhage Neck: Supple, no carotid bruits. Pulmonary: Clear to auscultation bilaterally   NEUROLOGICAL EXAM:  MENTAL STATUS: Speech:    Speech is normal; fluent and spontaneous with normal comprehension.  Cognition:     Orientation to time, place and person     Normal recent and remote memory     Normal Attention span and concentration     Normal Language, naming, repeating,spontaneous speech     Fund of  knowledge   CRANIAL NERVES: CN II: Visual fields are full to confrontation. Pupils are round equal and briskly reactive to light. CN III, IV, VI: extraocular movement are normal. No ptosis. CN V: Facial sensation is intact to light touch, bilateral corneal reflex were present and symmetric. CN VII: Face is symmetric with normal eye closure  CN VIII: Hearing is normal to causal conversation. CN IX, X: Phonation is normal.  Patient is hesitated to open her mouth due to right facial pain CN XI: Head turning and shoulder shrug are intact  MOTOR: There is no pronator drift of out-stretched arms. Muscle bulk and tone are normal. Muscle strength is normal.  REFLEXES: Reflexes are 2+ and symmetric at the biceps, triceps, knees, and ankles. Plantar responses  are flexor.  SENSORY: Intact to light touch, pinprick and vibratory sensation are intact in fingers and toes.  COORDINATION: There is no trunk or limb dysmetria noted.  GAIT/STANCE: Posture is normal. Gait is steady with normal steps, base, arm swing, and turning. Heel and toe walking are normal. Tandem gait is normal.  Romberg is absent.   DIAGNOSTIC DATA (LABS, IMAGING, TESTING) - I reviewed patient records, labs, notes, testing and imaging myself where available.   ASSESSMENT AND PLAN  Brooke Baldwin is a 44 y.o. female   Right trigeminal neuralgia  Involving right V3 branch  Laboratory evaluations  MRI of the brain with without contrast to rule out structural lesion  She failed to response to Tegretol 100 mg twice daily, complains of excessive drowsiness with baclofen 10 mg daily  Will try Trileptal 150 mg titrating to 300 mg twice daily  Also add on gabapentin 300 mg, may titrating up to 600 mg 3 times daily  Return to clinic with nurse practitioner Sarah in 1 month   Marcial Pacas, M.D. Ph.D.  [facility] [address] [place], [place] [address] Ph: [phone] Fax: [phone]   CC: Referring Provider

## 2019-12-09 NOTE — Telephone Encounter (Signed)
Patient is scheduled for 12/18/19.

## 2019-12-09 NOTE — Telephone Encounter (Signed)
medicare order faxed to [facility]. they will reach out to the patient to schedule

## 2019-12-10 ENCOUNTER — Telehealth: Payer: Self-pay | Admitting: Neurology

## 2019-12-10 DIAGNOSIS — R899 Unspecified abnormal finding in specimens from other organs, systems and tissues: Secondary | ICD-10-CM

## 2019-12-10 DIAGNOSIS — N289 Disorder of kidney and ureter, unspecified: Secondary | ICD-10-CM

## 2019-12-10 LAB — CBC WITH DIFFERENTIAL
Basophils Absolute: 0 10*3/uL (ref 0.0–0.2)
Basos: 1 %
EOS (ABSOLUTE): 0.2 10*3/uL (ref 0.0–0.4)
Eos: 5 %
Hematocrit: 41.3 % (ref 34.0–46.6)
Hemoglobin: 14.1 g/dL (ref 11.1–15.9)
Immature Grans (Abs): 0 10*3/uL (ref 0.0–0.1)
Immature Granulocytes: 0 %
Lymphocytes Absolute: 1.1 10*3/uL (ref 0.7–3.1)
Lymphs: 26 %
MCH: 29.4 pg (ref 26.6–33.0)
MCHC: 34.1 g/dL (ref 31.5–35.7)
MCV: 86 fL (ref 79–97)
Monocytes Absolute: 0.4 10*3/uL (ref 0.1–0.9)
Monocytes: 10 %
Neutrophils Absolute: 2.5 10*3/uL (ref 1.4–7.0)
Neutrophils: 58 %
RBC: 4.8 x10E6/uL (ref 3.77–5.28)
RDW: 13.7 % (ref 11.7–15.4)
WBC: 4.2 10*3/uL (ref 3.4–10.8)

## 2019-12-10 LAB — COMPREHENSIVE METABOLIC PANEL
ALT: 20 IU/L (ref 0–32)
AST: 30 IU/L (ref 0–40)
Albumin/Globulin Ratio: 1.2 (ref 1.2–2.2)
Albumin: 4.4 g/dL (ref 3.8–4.8)
Alkaline Phosphatase: 125 IU/L — ABNORMAL HIGH (ref 39–117)
BUN/Creatinine Ratio: 11 (ref 9–23)
BUN: 21 mg/dL (ref 6–24)
Bilirubin Total: 0.2 mg/dL (ref 0.0–1.2)
CO2: 20 mmol/L (ref 20–29)
Calcium: 9.9 mg/dL (ref 8.7–10.2)
Chloride: 103 mmol/L (ref 96–106)
Creatinine, Ser: 1.85 mg/dL — ABNORMAL HIGH (ref 0.57–1.00)
GFR calc Af Amer: 37 mL/min/{1.73_m2} — ABNORMAL LOW (ref >59)
GFR calc non Af Amer: 32 mL/min/{1.73_m2} — ABNORMAL LOW (ref >59)
Globulin, Total: 3.8 g/dL (ref 1.5–4.5)
Glucose: 90 mg/dL (ref 65–99)
Potassium: 4.9 mmol/L (ref 3.5–5.2)
Sodium: 139 mmol/L (ref 134–144)
Total Protein: 8.2 g/dL (ref 6.0–8.5)

## 2019-12-10 LAB — TSH: TSH: 0.98 u[IU]/mL (ref 0.450–4.500)

## 2019-12-10 NOTE — Addendum Note (Signed)
Addended by: Noberto Retort C on: 12/10/2019 02:51 PM   Modules accepted: Orders

## 2019-12-10 NOTE — Telephone Encounter (Signed)
Please call patient, laboratory evaluation showed abnormal creatinine, elevated 1.85, with estimated GFR of 37, I will refer her to nephrology for further evaluation  Please advise her to drink a lot of water, come in office for repeat kidney function test  Also check on her response to medication for her facial pain, because of her abnormal kidney function, she tends to suffer side effects from the medications,

## 2019-12-10 NOTE — Telephone Encounter (Addendum)
I spoke to the patient and she verbalized understanding of her lab results. She will also increase her water intake. She plans to come next Monday for the additional lab work. She was in agreement to the nephrology referral (order placed in Epic).  She has not started her medications yet but will keep [place] update on the therapeutic benefit.

## 2019-12-15 ENCOUNTER — Other Ambulatory Visit (INDEPENDENT_AMBULATORY_CARE_PROVIDER_SITE_OTHER): Payer: Self-pay

## 2019-12-15 ENCOUNTER — Other Ambulatory Visit: Payer: Self-pay

## 2019-12-15 DIAGNOSIS — R899 Unspecified abnormal finding in specimens from other organs, systems and tissues: Secondary | ICD-10-CM

## 2019-12-15 DIAGNOSIS — Z0289 Encounter for other administrative examinations: Secondary | ICD-10-CM

## 2019-12-16 ENCOUNTER — Telehealth: Payer: Self-pay | Admitting: Neurology

## 2019-12-16 LAB — BUN+CREAT
BUN/Creatinine Ratio: 10 (ref 9–23)
BUN: 21 mg/dL (ref 6–24)
Creatinine, Ser: 2.06 mg/dL — ABNORMAL HIGH (ref 0.57–1.00)
GFR calc Af Amer: 33 mL/min/{1.73_m2} — ABNORMAL LOW (ref >59)
GFR calc non Af Amer: 28 mL/min/{1.73_m2} — ABNORMAL LOW (ref >59)

## 2019-12-16 NOTE — Telephone Encounter (Signed)
Medicare order faxed to [facility]. they will reach out to the patient to schedule. They will reach out to the patient to schedule.

## 2019-12-16 NOTE — Telephone Encounter (Signed)
Pt called and given results. She verbalized understanding. She sts she has not scheduled ophthalmologist appt yet but will call soon to set up.

## 2019-12-16 NOTE — Addendum Note (Signed)
Addended by: Marcial Pacas on: 12/16/2019 10:43 AM   Modules accepted: Orders

## 2019-12-16 NOTE — Telephone Encounter (Signed)
Please call patient, repeat laboratory evaluation continue to show abnormal kidney function, with estimated GFR of 33,  I have changed his MRI orders without contrast  Previously also referred him to ophthalmologist.

## 2019-12-24 ENCOUNTER — Telehealth: Payer: Self-pay | Admitting: Neurology

## 2019-12-24 NOTE — Telephone Encounter (Signed)
Please call patient, MRI of the brain was normal

## 2019-12-28 NOTE — Addendum Note (Signed)
Addended by: Desmond Lope on: 12/28/2019 09:14 AM   Modules accepted: Orders

## 2019-12-28 NOTE — Telephone Encounter (Signed)
The patient verbalized understanding of her MRI brain results.  She has continued taking Trileptal 150mg , two tablets BID. Gabapentin caused intolerable dizziness, even after trying it just at bedtime rather than throughout the day. She has a pending appt on 12/30/19 w/ Butler Denmark, NP. The patient would like to discuss other medication options at her follow up.

## 2019-12-30 ENCOUNTER — Encounter: Payer: Self-pay | Admitting: Neurology

## 2019-12-30 ENCOUNTER — Other Ambulatory Visit: Payer: Self-pay

## 2019-12-30 ENCOUNTER — Encounter: Payer: Self-pay | Admitting: *Deleted

## 2019-12-30 ENCOUNTER — Ambulatory Visit (INDEPENDENT_AMBULATORY_CARE_PROVIDER_SITE_OTHER): Payer: Medicare Other | Admitting: Neurology

## 2019-12-30 VITALS — BP 143/100 | HR 103 | Ht 62.0 in | Wt 225.0 lb

## 2019-12-30 DIAGNOSIS — G500 Trigeminal neuralgia: Secondary | ICD-10-CM

## 2019-12-30 MED ORDER — PREGABALIN 50 MG PO CAPS: 50.0000 mg | ORAL_CAPSULE | Freq: Two times a day (BID) | ORAL | 5 refills | Status: DC

## 2019-12-30 NOTE — Patient Instructions (Signed)
Try Lyrica 50 mg twice daily for pain  Continue Trileptal  Refer you to nephrology  See you back 4 months

## 2019-12-30 NOTE — Progress Notes (Signed)
PATIENT: Brooke Baldwin DOB: Nov 18, 1975  REASON FOR VISIT: follow up HISTORY FROM: patient  HISTORY OF PRESENT ILLNESS: Today 12/30/19  HISTORY  Brooke Baldwin is a 44 year old female, seen in request by emergency room for evaluation of right trigeminal neuralgia, initial evaluation was on Dec 09, 2019.  I have reviewed and summarized the referring note from the referring physician.  She moved from [place] to [place] in November 2019.  Her symptoms started in early 2019, initially she thought she was experienced dental problem, has intermittent radiating pain from right temporal area to right lower jaw, but diagnosis was never made, her symptoms was transient,  Since she moved to [place], she presented to emergency room multiple times for similar complaints, in February 2020, she experienced her first severe episode, daily severe radiating pain to right V3 distribution, she was diagnosed with right trigeminal neuralgia, was given prescription of Tegretol 100 mg twice daily, helped her symptoms within 1 week, only has occasionally recurrent radiating pain while she was taking 100 mg twice daily, eventually disappeared, she stopped taking Tegretol around June 2020.  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  Update Dec 30, 2019 SS:  Laboratory evaluation showed elevated creatinine 1.85, GFR 37, referred to nephrology.  MRI of the brain was normal.  Gabapentin resulted in intolerable dizziness, she remains on Trileptal 150 mg, 2 tablets twice a day. Has continued right V3 pain, has improved with Trileptal, 40% better.  It worsens with prolonged talking.  She has been very careful with chewing, has to break her food, chew on left side.  She missed work over the weekend, due to side effect of gabapentin, she only took 300 mg at bedtime.  Has not heard back from nephrology.  REVIEW OF SYSTEMS: Out of a complete 14 system review of symptoms, the patient complains only of the following symptoms, and all other reviewed systems are negative.  Facial pain  ALLERGIES: Allergies  Allergen Reactions  . Baclofen Other (See Comments)    Caused intolerable dizziness   . Gabapentin Other (See Comments)    Caused intolerable dizziness  . Penicillins Other (See Comments)    Unknown childhood reaction     HOME MEDICATIONS: Outpatient Medications Prior to Visit  Medication Sig Dispense Refill  . OXcarbazepine (TRILEPTAL) 150 MG tablet Take 2 tablets (300 mg total) by mouth 2 (two) times daily. 120 tablet 6  . carbamazepine (TEGRETOL-XR) 100 MG 12 hr tablet Take 1 tablet (100 mg total) by mouth 2 (two) times daily. 60 tablet 1  . celecoxib (CELEBREX) 200 MG capsule Take 1 capsule (200 mg total) by mouth 2 (two) times daily. 20 capsule 0   No facility-administered medications prior to visit.    PAST MEDICAL HISTORY: Past Medical History:  Diagnosis Date  . Trigeminal neuralgia     PAST SURGICAL  HISTORY: Past Surgical History:  Procedure Laterality Date  . ANKLE FRACTURE SURGERY Right   . CERVIX SURGERY     removal of HPV warts    FAMILY HISTORY: Family History  Problem Relation Age of Onset  . Diabetes Mother   . Healthy Father     SOCIAL HISTORY: Social History   Socioeconomic History  . Marital status: Single     Spouse name: Not on file  . Number of children: 0  . Years of education: college  . Highest education level: Bachelor's degree (e.g., BA, AB, BS)  Occupational History  . Occupation: Administrator, arts  Tobacco Use  . Smoking status: Never Smoker  . Smokeless tobacco: Never Used  Substance and Sexual Activity  . Alcohol use: Yes    Comment: occasional  . Drug use: Not Currently  . Sexual activity: Not Currently    Birth control/protection: None  Other Topics Concern  . Not on file  Social History Narrative   Lives at home with significant other.   Right-handed.   No daily caffeine use.   Social Determinants of Health   Financial Resource Strain:   . Difficulty of Paying Living Expenses:   Food Insecurity:   . Worried About Charity fundraiser in the Last Year:   . Arboriculturist in the Last Year:   Transportation Needs:   . Film/video editor (Medical):   Marland Kitchen Lack of Transportation (Non-Medical):   Physical Activity:   . Days of Exercise per Week:   . Minutes of Exercise per Session:   Stress:   . Feeling of Stress :   Social Connections:   . Frequency of Communication with Friends and Family:   . Frequency of Social Gatherings with Friends and Family:   . Attends Religious Services:   . Active Member of Clubs or Organizations:   . Attends Archivist Meetings:   Marland Kitchen Marital Status:   Intimate Partner Violence:   . Fear of Current or Ex-Partner:   . Emotionally Abused:   Marland Kitchen Physically Abused:   . Sexually Abused:    PHYSICAL EXAM  Vitals:   12/30/19 1229  BP: (!) 143/100  Pulse: (!) 103  Weight: 225 lb (102.1 kg)  Height: 5\' 2"  (1.575 m)   Body mass index is 41.15 kg/m.  Generalized: Well developed, in no acute distress   Neurological examination  Mentation: Alert oriented to time, place, history taking. Follows all commands speech and language fluent Cranial nerve II-XII: Pupils were equal round reactive to light. Extraocular movements were full,  visual field were full on confrontational test. Facial sensation and strength were normal. Head turning and shoulder shrug  were normal and symmetric.  Is hesitant soft touch to right V3 area Motor: The motor testing reveals 5 over 5 strength of all 4 extremities. Good symmetric motor tone is noted throughout.  Sensory: Sensory testing is intact to soft touch on all 4 extremities. No evidence of extinction is noted.  Coordination: Cerebellar testing reveals good finger-nose-finger and heel-to-shin bilaterally.  Gait and station: Gait is normal. Reflexes: Deep tendon reflexes are symmetric and normal bilaterally.   DIAGNOSTIC DATA (LABS, IMAGING, TESTING) - I reviewed patient records, labs, notes, testing and imaging myself where available.  Lab Results  Component Value Date   WBC 4.2 12/09/2019   HGB 14.1 12/09/2019   HCT 41.3 12/09/2019   MCV 86 12/09/2019      Component Value Date/Time   NA 139 12/09/2019 0954  K 4.9 12/09/2019 0954   CL 103 12/09/2019 0954   CO2 20 12/09/2019 0954   GLUCOSE 90 12/09/2019 0954   BUN 21 12/15/2019 0848   CREATININE 2.06 (H) 12/15/2019 0848   CALCIUM 9.9 12/09/2019 0954   PROT 8.2 12/09/2019 0954   ALBUMIN 4.4 12/09/2019 0954   AST 30 12/09/2019 0954   ALT 20 12/09/2019 0954   ALKPHOS 125 (H) 12/09/2019 0954   BILITOT 0.2 12/09/2019 0954   GFRNONAA 28 (L) 12/15/2019 0848   GFRAA 33 (L) 12/15/2019 0848   No results found for: CHOL, HDL, LDLCALC, LDLDIRECT, TRIG, CHOLHDL No results found for: HGBA1C No results found for: VITAMINB12 Lab Results  Component Value Date   TSH 0.980 12/09/2019      ASSESSMENT AND PLAN 44 y.o. year old female  has a past medical history of Trigeminal neuralgia. here with:  1.  Right trigeminal neuralgia -Involving right V3 branch -Laboratory evaluation has shown elevated creatinine 2.06, GFR 33 -MRI of the brain without contrast was normal -Could not tolerate gabapentin, baclofen, Tegretol -Continue  Trileptal 150 mg, 2 tablets twice daily -Try Lyrica 50 mg twice daily  -Given work note for missed work over the weekend -Follow-up with nephrology regarding abnormal kidney function, referral sent to [facility], patient will follow-up with them -Follow-up in 4 months or sooner if needed  I spent 20 minutes of face-to-face and non-face-to-face time with patient.  This included previsit chart review, lab review, study review, order entry, electronic health record documentation, patient education.  Butler Denmark, AGNP-C, DNP 12/30/2019, 1:08 PM [facility] [address] [place], [place] [address] [phone]

## 2020-01-12 ENCOUNTER — Ambulatory Visit: Payer: Medicare Other | Admitting: Neurology

## 2020-02-09 ENCOUNTER — Telehealth: Payer: Self-pay

## 2020-02-09 NOTE — Telephone Encounter (Addendum)
Please call patient, she may stop pregabalin 50 mg twice a day first,  If she remains pain-free, she may consider tapering down Trileptal, she is now taking 150 mg twice a day, she can take half tablets twice a day for 2 weeks then stop

## 2020-02-09 NOTE — Telephone Encounter (Signed)
Left message for patient to return my call.

## 2020-02-09 NOTE — Telephone Encounter (Signed)
Pt called about medication OXcarbazepine (TRILEPTAL) 150 MG tablet 2 times daily And pregabalin (LYRICA) 50 MG 2 times daily     states both medications make her feel bad and would like to know if she can decrease to 1 time daily for both medication

## 2020-02-09 NOTE — Telephone Encounter (Signed)
Patient returning call.

## 2020-02-09 NOTE — Telephone Encounter (Signed)
She was last 12/30/19 for right trigeminal neuralgia. She has been taking oxcarbazepine 150mg  BID and pregabalin 50mg  BID. Since being on these medications, she has struggled with daytime fatigue. Recently, her pain has subsided. She would like to know if she can reduce the amount of medication she is taking. Says she occasionally has to skip doses when she has to drive because the drowsiness is so bad.

## 2020-02-09 NOTE — Telephone Encounter (Signed)
I returned the call to the patient. She verbalized understanding of the directions below. She will stop Lyrica first then taper off Trileptal, if able. She will call us back with any issues.

## 2020-03-22 NOTE — Progress Notes (Signed)
I have reviewed and agreed above plan. 

## 2020-03-29 ENCOUNTER — Other Ambulatory Visit (HOSPITAL_COMMUNITY): Payer: Self-pay | Admitting: Nephrology

## 2020-03-29 DIAGNOSIS — N184 Chronic kidney disease, stage 4 (severe): Secondary | ICD-10-CM

## 2020-04-12 ENCOUNTER — Other Ambulatory Visit: Payer: Self-pay | Admitting: Physician Assistant

## 2020-04-12 ENCOUNTER — Other Ambulatory Visit: Payer: Self-pay | Admitting: Student

## 2020-04-13 ENCOUNTER — Ambulatory Visit (HOSPITAL_COMMUNITY)
Admission: RE | Admit: 2020-04-13 | Discharge: 2020-04-13 | Disposition: A | Discharge status: Home / Self Care | Payer: Medicare Other | Source: Ambulatory Visit | Attending: Nephrology | Admitting: Nephrology

## 2020-04-13 ENCOUNTER — Other Ambulatory Visit: Payer: Self-pay

## 2020-04-13 ENCOUNTER — Other Ambulatory Visit (HOSPITAL_COMMUNITY): Payer: Self-pay | Admitting: Nephrology

## 2020-04-13 ENCOUNTER — Encounter (HOSPITAL_COMMUNITY): Payer: Self-pay

## 2020-04-13 DIAGNOSIS — R319 Hematuria, unspecified: Secondary | ICD-10-CM | POA: Insufficient documentation

## 2020-04-13 DIAGNOSIS — N184 Chronic kidney disease, stage 4 (severe): Secondary | ICD-10-CM

## 2020-04-13 DIAGNOSIS — Z79899 Other long term (current) drug therapy: Secondary | ICD-10-CM | POA: Insufficient documentation

## 2020-04-13 LAB — CBC WITH DIFFERENTIAL/PLATELET
Abs Immature Granulocytes: 0.06 10*3/uL (ref 0.00–0.07)
Basophils Absolute: 0 10*3/uL (ref 0.0–0.1)
Basophils Relative: 0 %
Eosinophils Absolute: 0 10*3/uL (ref 0.0–0.5)
Eosinophils Relative: 0 %
HCT: 39.2 % (ref 36.0–46.0)
Hemoglobin: 12.5 g/dL (ref 12.0–15.0)
Immature Granulocytes: 1 %
Lymphocytes Relative: 11 %
Lymphs Abs: 0.8 10*3/uL (ref 0.7–4.0)
MCH: 28.9 pg (ref 26.0–34.0)
MCHC: 31.9 g/dL (ref 30.0–36.0)
MCV: 90.5 fL (ref 80.0–100.0)
Monocytes Absolute: 0.5 10*3/uL (ref 0.1–1.0)
Monocytes Relative: 7 %
Neutro Abs: 6.4 10*3/uL (ref 1.7–7.7)
Neutrophils Relative %: 81 %
Platelets: 248 10*3/uL (ref 150–400)
RBC: 4.33 MIL/uL (ref 3.87–5.11)
RDW: 15 % (ref 11.5–15.5)
WBC: 7.8 10*3/uL (ref 4.0–10.5)
nRBC: 0 % (ref 0.0–0.2)

## 2020-04-13 LAB — PROTIME-INR
INR: 0.9 (ref 0.8–1.2)
Prothrombin Time: 12.1 seconds (ref 11.4–15.2)

## 2020-04-13 MED ORDER — FENTANYL CITRATE (PF) 100 MCG/2ML IJ SOLN
INTRAMUSCULAR | Status: AC
  Filled 2020-04-13: qty 2

## 2020-04-13 MED ORDER — MIDAZOLAM HCL 2 MG/2ML IJ SOLN
INTRAMUSCULAR | Status: AC
  Filled 2020-04-13: qty 2

## 2020-04-13 MED ORDER — FENTANYL CITRATE (PF) 100 MCG/2ML IJ SOLN
INTRAMUSCULAR | Status: AC | PRN
  Administered 2020-04-13: 25 ug via INTRAVENOUS
  Administered 2020-04-13: 50 ug via INTRAVENOUS

## 2020-04-13 MED ORDER — SODIUM CHLORIDE 0.9 % IV SOLN: INTRAVENOUS | Status: DC

## 2020-04-13 MED ORDER — GELATIN ABSORBABLE 12-7 MM EX MISC
CUTANEOUS | Status: AC
  Filled 2020-04-13: qty 1

## 2020-04-13 MED ORDER — LIDOCAINE HCL (PF) 1 % IJ SOLN
INTRAMUSCULAR | Status: AC
  Filled 2020-04-13: qty 30

## 2020-04-13 MED ORDER — HYDRALAZINE HCL 20 MG/ML IJ SOLN
10.0000 mg | Freq: Once | INTRAMUSCULAR | Status: AC
  Administered 2020-04-13: 10 mg via INTRAVENOUS

## 2020-04-13 MED ORDER — HYDRALAZINE HCL 20 MG/ML IJ SOLN
INTRAMUSCULAR | Status: AC | PRN
  Administered 2020-04-13: 10 mg via INTRAVENOUS

## 2020-04-13 MED ORDER — HYDROCODONE-ACETAMINOPHEN 5-325 MG PO TABS: 1.0000 | ORAL_TABLET | ORAL | Status: DC | PRN

## 2020-04-13 MED ORDER — MIDAZOLAM HCL 2 MG/2ML IJ SOLN
INTRAMUSCULAR | Status: AC | PRN
  Administered 2020-04-13: 1 mg via INTRAVENOUS
  Administered 2020-04-13: 0.5 mg via INTRAVENOUS

## 2020-04-13 MED ORDER — HYDRALAZINE HCL 20 MG/ML IJ SOLN
INTRAMUSCULAR | Status: AC
  Filled 2020-04-13: qty 1

## 2020-04-13 NOTE — H&P (Signed)
Chief Complaint: Patient was seen in consultation today for chronic kidney disease  Referring Physician(s): [place]  Supervising Physician: Jacqulynn Cadet  Patient Status: [facility] - Out-pt  History of Present Illness: Brooke Baldwin is a 44 y.o. female with past medical history of ankle fracture requiring surgery, trigeminal neuralgia, and chronic kidney disease stage IV followed by Dr. Moshe Cipro.  IR consulted for random renal biopsy.   Patient assessed [facility] this AM.  She reports she is in her usual state of health.  She denies history of hypertension but is currently taking steroid to try and "stimulate her kidney function."  She has discuss kidney biopsy with Dr. Moshe Cipro and is agreeable to proceed.  She is otherwise wihtout fever, chills, nausea, vomiting, abdominal pain, shortness of breath, cough, dysuria.   Past Medical History:  Diagnosis Date  . Trigeminal neuralgia     Past Surgical History:  Procedure Laterality Date  . ANKLE FRACTURE SURGERY Right   . CERVIX SURGERY     removal of HPV warts    Allergies: Baclofen, Gabapentin, and Penicillins  Medications: Prior to Admission medications   Medication Sig Start Date End Date Taking? Authorizing Provider  predniSONE (DELTASONE) 20 MG tablet Take 20 mg by mouth 2 (two) times daily.   Yes [provider]  OXcarbazepine (TRILEPTAL) 150 MG tablet Take 2 tablets (300 mg total) by mouth 2 (two) times daily. Patient not taking: Reported on 04/07/2020 12/09/19   Marcial Pacas, MD  pregabalin (LYRICA) 50 MG capsule Take 1 capsule (50 mg total) by mouth 2 (two) times daily. Patient not taking: Reported on 04/07/2020 12/30/19   Suzzanne Cloud, NP     Family History  Problem Relation Age of Onset  . Diabetes Mother   . Healthy Father     Social History   Socioeconomic History  . Marital status: Single    Spouse name: Not on file  . Number of children: 0  . Years of education:  college  . Highest education level: Bachelor's degree (e.g., BA, AB, BS)  Occupational History  . Occupation: Administrator, arts  Tobacco Use  . Smoking status: Never Smoker  . Smokeless tobacco: Never Used  Vaping Use  . Vaping Use: Never used  Substance and Sexual Activity  . Alcohol use: Yes    Comment: occasional  . Drug use: Not Currently  . Sexual activity: Not Currently    Birth control/protection: None  Other Topics Concern  . Not on file  Social History Narrative   Lives at home with significant other.   Right-handed.   No daily caffeine use.   Social Determinants of Health   Financial Resource Strain:   . Difficulty of Paying Living Expenses: Not on file  Food Insecurity:   . Worried About Charity fundraiser in the Last Year: Not on file  . Ran Out of Food in the Last Year: Not on file  Transportation Needs:   . Lack of Transportation (Medical): Not on file  . Lack of Transportation (Non-Medical): Not on file  Physical Activity:   . Days of Exercise per Week: Not on file  . Minutes of Exercise per Session: Not on file  Stress:   . Feeling of Stress : Not on file  Social Connections:   . Frequency of Communication with Friends and Family: Not on file  . Frequency of Social Gatherings with Friends and Family: Not on file  . Attends Religious Services: Not on file  . Active  Member of Clubs or Organizations: Not on file  . Attends Archivist Meetings: Not on file  . Marital Status: Not on file     Review of Systems: A 12 point ROS discussed and pertinent positives are indicated in the HPI above.  All other systems are negative.  Review of Systems  Constitutional: Negative for fatigue and fever.  Respiratory: Negative for cough and shortness of breath.   Cardiovascular: Negative for chest pain.  Gastrointestinal: Negative for abdominal pain, nausea and vomiting.  Musculoskeletal: Negative for back pain.  Psychiatric/Behavioral: Negative for behavioral  problems and confusion.    Vital Signs: BP (!) 165/112   Pulse 71   Temp 98.1 F (36.7 C) (Oral)   Resp 20   Ht 5\' 2"  (1.575 m)   Wt 217 lb (98.4 kg)   LMP 04/02/2015 (Approximate)   SpO2 100%   BMI 39.69 kg/m   Physical Exam Vitals and nursing note reviewed.  Constitutional:      General: She is not in acute distress.    Appearance: Normal appearance. She is not ill-appearing.  HENT:     Mouth/Throat:     Mouth: Mucous membranes are moist.     Pharynx: Oropharynx is clear.  Cardiovascular:     Rate and Rhythm: Normal rate and regular rhythm.  Pulmonary:     Effort: Pulmonary effort is normal. No respiratory distress.     Breath sounds: Normal breath sounds.  Abdominal:     General: Abdomen is flat. There is no distension.     Palpations: Abdomen is soft.  Skin:    General: Skin is warm and dry.  Neurological:     General: No focal deficit present.     Mental Status: She is alert and oriented to person, place, and time. Mental status is at baseline.  Psychiatric:        Mood and Affect: Mood normal.        Behavior: Behavior normal.        Thought Content: Thought content normal.        Judgment: Judgment normal.      MD Evaluation Airway: WNL Heart: WNL Abdomen: WNL Chest/ Lungs: WNL ASA  Classification: 3 Mallampati/Airway Score: Three   Imaging: No results found.  Labs:  CBC: Recent Labs    12/09/19 0954 04/13/20 0620  WBC 4.2 7.8  HGB 14.1 12.5  HCT 41.3 39.2  PLT  --  248    COAGS: Recent Labs    04/13/20 0620  INR 0.9    BMP: Recent Labs    12/09/19 0954 12/15/19 0848  NA 139  --   K 4.9  --   CL 103  --   CO2 20  --   GLUCOSE 90  --   BUN 21 21  CALCIUM 9.9  --   CREATININE 1.85* 2.06*  GFRNONAA 32* 28*  GFRAA 37* 33*    LIVER FUNCTION TESTS: Recent Labs    12/09/19 0954  BILITOT 0.2  AST 30  ALT 20  ALKPHOS 125*  PROT 8.2  ALBUMIN 4.4    TUMOR MARKERS: No results for input(s): AFPTM, CEA, CA199,  CHROMGRNA in the last 8760 hours.  Assessment and Plan: Patient with chronic kidney disease stage IV presents with complaint for random renal biopsy at the request of Dr. Moshe Cipro. Case reviewed by Dr. Laurence Ferrari who approves patient for procedure.  Patient presents today in their usual state of health.  She has been NPO and is  not currently on blood thinners.  She has no documented history of hypertension, however her BP is found to be 165/112 this AM.  On recheck after rest, she is 189/99. MAP remains elevated at 129. Given 10mg  hydralazine IV. Will monitor for improvement prior to proceeding with biopsy.   Risks and benefits of biopsy was discussed with the patient and/or patient's family including, but not limited to bleeding, infection, damage to adjacent structures or low yield requiring additional tests.  All of the questions were answered and there is agreement to proceed.  Consent signed and in chart.   Thank you for this interesting consult.  I greatly enjoyed meeting Shanera Meske and look forward to participating in their care.  A copy of this report was sent to the requesting provider on this date.  Electronically Signed: Docia Barrier, PA 04/13/2020, 7:51 AM   I spent a total of  30 Minutes   in face to face in clinical consultation, greater than 50% of which was counseling/coordinating care for chronic kidney disease.

## 2020-04-13 NOTE — Discharge Instructions (Addendum)
Percutaneous Kidney Biopsy, Care After This sheet gives you information about how to care for yourself after your procedure. Your health care provider may also give you more specific instructions. If you have problems or questions, contact your health care provider. What can I expect after the procedure? After the procedure, it is common to have:  Pain or soreness near the biopsy site.  Pink or cloudy urine for 24 hours after the procedure. Follow these instructions at home: Activity  Return to your normal activities as told by your health care provider. Ask your health care provider what activities are safe for you.  If you were given a sedative during the procedure, it can affect you for several hours. Do not drive or operate machinery until your health care provider says that it is safe.  Do not lift anything that is heavier than 10 lb (4.5 kg), or the limit that you are told, until your health care provider says that it is safe. About 1 week  Avoid activities that take a lot of effort (are strenuous) until your health care provider approves. Most people will have to wait 2 weeks before returning to activities such as exercise or sex. General instructions   Take over-the-counter and prescription medicines only as told by your health care provider.  You may eat and drink after your procedure. Follow instructions from your health care provider about eating or drinking restrictions.  Check your biopsy site every day for signs of infection. Check for: ? More redness, swelling, or pain. ? Fluid or blood. ? Warmth. ? Pus or a bad smell.  Keep all follow-up visits as told by your health care provider. This is important.  Remove bandaid in 24 hours and then you may shower. Contact a health care provider if:  You have more redness, swelling, or pain around your biopsy site.  You have fluid or blood coming from your biopsy site.  Your biopsy site feels warm to the touch.  You have  pus or a bad smell coming from your biopsy site.  You have blood in your urine more than 24 hours after your procedure.  You have a fever. Get help right away if:  Your urine is dark red or brown.  You cannot urinate.  It burns when you urinate.  You feel dizzy or light-headed.  You have severe pain in your abdomen or side. Summary  After the procedure, it is common to have pain or soreness at the biopsy site and pink or cloudy urine for the first 24 hours.  Check your biopsy site each day for signs of infection, such as more redness, swelling, or pain; fluid, blood, pus or a bad smell coming from the biopsy site; or the biopsy site feeling warm to touch.  Return to your normal activities as told by your health care provider. This information is not intended to replace advice given to you by your health care provider. Make sure you discuss any questions you have with your health care provider. Document Revised: 03/26/2019 Document Reviewed: 03/26/2019 Elsevier Patient Education  [place].  Moderate Conscious Sedation, Adult, Care After These instructions provide you with information about caring for yourself after your procedure. Your health care provider may also give you more specific instructions. Your treatment has been planned according to current medical practices, but problems sometimes occur. Call your health care provider if you have any problems or questions after your procedure. What can I expect after the procedure? After your procedure,  it is common:  To feel sleepy for several hours.  To feel clumsy and have poor balance for several hours.  To have poor judgment for several hours.  To vomit if you eat too soon. Follow these instructions at home: For at least 24 hours after the procedure:   Do not: ? Participate in activities where you could fall or become injured. ? Drive. ? Use heavy machinery. ? Drink alcohol. ? Take sleeping pills or  medicines that cause drowsiness. ? Make important decisions or sign legal documents. ? Take care of children on your own.  Rest. Eating and drinking  Follow the diet recommended by your health care provider.  If you vomit: ? Drink water, juice, or soup when you can drink without vomiting. ? Make sure you have little or no nausea before eating solid foods. General instructions  Have a responsible adult stay with you until you are awake and alert.  Take over-the-counter and prescription medicines only as told by your health care provider.  If you smoke, do not smoke without supervision.  Keep all follow-up visits as told by your health care provider. This is important. Contact a health care provider if:  You keep feeling nauseous or you keep vomiting.  You feel light-headed.  You develop a rash.  You have a fever. Get help right away if:  You have trouble breathing. This information is not intended to replace advice given to you by your health care provider. Make sure you discuss any questions you have with your health care provider. Document Revised: 07/05/2017 Document Reviewed: 11/12/2015 Elsevier Patient Education  2020 [employer].

## 2020-04-13 NOTE — Progress Notes (Signed)
Patient voided large amount clear urine.

## 2020-04-13 NOTE — Procedures (Signed)
Interventional Radiology Procedure Note  Procedure: CT random renal biopsy, LEFT  Complications: None  Estimated Blood Loss: None  Recommendations: - Bedrest x 6 hrs - DC home if no evidence of delayed bleeding   Signed,  Criselda Peaches, MD

## 2020-04-26 LAB — SURGICAL PATHOLOGY

## 2020-04-27 ENCOUNTER — Encounter (HOSPITAL_COMMUNITY): Payer: Self-pay | Admitting: Nephrology

## 2020-05-04 ENCOUNTER — Ambulatory Visit: Payer: Medicare Other | Admitting: Neurology

## 2020-05-04 ENCOUNTER — Encounter: Payer: Self-pay | Admitting: Neurology

## 2020-05-04 NOTE — Progress Notes (Deleted)
PATIENT: Brooke Baldwin DOB: 1975-11-21  REASON FOR VISIT: follow up HISTORY FROM: patient  HISTORY OF PRESENT ILLNESS: Today 05/04/20  HISTORY   Brooke Baldwin a 44 year old female,seen in request byemergency room for evaluation of right trigeminal neuralgia, initial evaluation was on Dec 09, 2019.  I have reviewed and summarized the referring note from the referring physician.She moved from [place] to [place] in November 2019. Her symptoms started in early 2019, initially she thought she was experienced dental problem, has intermittent radiating pain from right temporal area to right lower jaw, but diagnosis was never made, her symptoms was transient,  Since she moved to [place], she presented to emergency room multiple times for similar complaints, in February 2020, she experienced her first severe episode, daily severe radiating pain to right V3 distribution, she was diagnosed with right trigeminal neuralgia, was given prescription of Tegretol 100 mg twice daily, helped her symptoms within 1 week, only has occasionally recurrent radiating pain while she was taking 100 mg twice daily, eventually disappeared, she stopped taking Tegretol around June 2020.  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  Update Dec 30, 2019  SS: Laboratory evaluation showed elevated creatinine 1.85, GFR 37, referred to nephrology.  MRI of the brain was normal.  Gabapentin resulted in intolerable dizziness, she remains on Trileptal 150 mg, 2 tablets twice a day. Has continued right V3 pain, has improved with Trileptal, 40% better.  It worsens with prolonged talking.  She has been very careful with chewing, has to break her food, chew on left side.  She missed work over the weekend, due to side effect of gabapentin, she only took 300 mg at bedtime.  Has not heard back from nephrology.  Update May 04, 2020 SS:   REVIEW OF SYSTEMS: Out of a complete 14 system review of symptoms, the patient complains only of the following symptoms, and all other reviewed systems are negative.  ALLERGIES: Allergies  Allergen Reactions  . Baclofen Other (See Comments)    Caused intolerable dizziness   . Gabapentin Other (See Comments)    Caused intolerable dizziness  . Penicillins Other (See Comments)    Unknown childhood reaction     HOME MEDICATIONS: Outpatient Medications Prior to Visit  Medication Sig Dispense Refill  . OXcarbazepine (TRILEPTAL) 150 MG tablet Take 2 tablets (300 mg total) by mouth 2 (two) times daily. (Patient not taking: Reported on 04/07/2020) 120 tablet 6  . predniSONE (DELTASONE) 20 MG tablet Take 20 mg by mouth 2 (two) times daily.    . pregabalin (LYRICA) 50 MG capsule Take 1 capsule (50 mg total) by mouth 2 (two) times daily. (Patient not taking: Reported on 04/07/2020) 60 capsule 5   No facility-administered medications prior to visit.    PAST MEDICAL HISTORY: Past Medical History:  Diagnosis Date  . Trigeminal neuralgia  PAST SURGICAL HISTORY: Past Surgical History:  Procedure Laterality Date  . ANKLE FRACTURE SURGERY Right   . CERVIX SURGERY     removal of HPV warts    FAMILY HISTORY: Family History  Problem Relation Age of Onset  . Diabetes Mother   . Healthy Father     SOCIAL  HISTORY: Social History   Socioeconomic History  . Marital status: Single    Spouse name: Not on file  . Number of children: 0  . Years of education: college  . Highest education level: Bachelor's degree (e.g., BA, AB, BS)  Occupational History  . Occupation: Administrator, arts  Tobacco Use  . Smoking status: Never Smoker  . Smokeless tobacco: Never Used  Vaping Use  . Vaping Use: Never used  Substance and Sexual Activity  . Alcohol use: Yes    Comment: occasional  . Drug use: Not Currently  . Sexual activity: Not Currently    Birth control/protection: None  Other Topics Concern  . Not on file  Social History Narrative   Lives at home with significant other.   Right-handed.   No daily caffeine use.   Social Determinants of Health   Financial Resource Strain:   . Difficulty of Paying Living Expenses: Not on file  Food Insecurity:   . Worried About Charity fundraiser in the Last Year: Not on file  . Ran Out of Food in the Last Year: Not on file  Transportation Needs:   . Lack of Transportation (Medical): Not on file  . Lack of Transportation (Non-Medical): Not on file  Physical Activity:   . Days of Exercise per Week: Not on file  . Minutes of Exercise per Session: Not on file  Stress:   . Feeling of Stress : Not on file  Social Connections:   . Frequency of Communication with Friends and Family: Not on file  . Frequency of Social Gatherings with Friends and Family: Not on file  . Attends Religious Services: Not on file  . Active Member of Clubs or Organizations: Not on file  . Attends Archivist Meetings: Not on file  . Marital Status: Not on file  Intimate Partner Violence:   . Fear of Current or Ex-Partner: Not on file  . Emotionally Abused: Not on file  . Physically Abused: Not on file  . Sexually Abused: Not on file      PHYSICAL EXAM  There were no vitals filed for this visit. There is no height or weight on file to calculate  BMI.  Generalized: Well developed, in no acute distress   Neurological examination  Mentation: Alert oriented to time, place, history taking. Follows all commands speech and language fluent Cranial nerve II-XII: Pupils were equal round reactive to light. Extraocular movements were full, visual field were full on confrontational test. Facial sensation and strength were normal. Uvula tongue midline. Head turning and shoulder shrug  were normal and symmetric. Motor: The motor testing reveals 5 over 5 strength of all 4 extremities. Good symmetric motor tone is noted throughout.  Sensory: Sensory testing is intact to soft touch on all 4 extremities. No evidence of extinction is noted.  Coordination: Cerebellar testing reveals good finger-nose-finger and heel-to-shin bilaterally.  Gait and station: Gait is normal. Tandem gait is normal. Romberg is negative. No drift is seen.  Reflexes: Deep tendon reflexes are symmetric and normal bilaterally.   DIAGNOSTIC DATA (LABS, IMAGING, TESTING) - I reviewed patient records, labs, notes, testing and imaging myself where  available.  Lab Results  Component Value Date   WBC 7.8 04/13/2020   HGB 12.5 04/13/2020   HCT 39.2 04/13/2020   MCV 90.5 04/13/2020   PLT 248 04/13/2020      Component Value Date/Time   NA 139 12/09/2019 0954   K 4.9 12/09/2019 0954   CL 103 12/09/2019 0954   CO2 20 12/09/2019 0954   GLUCOSE 90 12/09/2019 0954   BUN 21 12/15/2019 0848   CREATININE 2.06 (H) 12/15/2019 0848   CALCIUM 9.9 12/09/2019 0954   PROT 8.2 12/09/2019 0954   ALBUMIN 4.4 12/09/2019 0954   AST 30 12/09/2019 0954   ALT 20 12/09/2019 0954   ALKPHOS 125 (H) 12/09/2019 0954   BILITOT 0.2 12/09/2019 0954   GFRNONAA 28 (L) 12/15/2019 0848   GFRAA 33 (L) 12/15/2019 0848   No results found for: CHOL, HDL, LDLCALC, LDLDIRECT, TRIG, CHOLHDL No results found for: HGBA1C No results found for: VITAMINB12 Lab Results  Component Value Date   TSH 0.980 12/09/2019       ASSESSMENT AND PLAN 44 y.o. year old female  has a past medical history of Trigeminal neuralgia. here with:  1. Right trigeminal neuralgia -Involving right V3 branch -Laboratory evaluation has shown elevated creatinine 2.06, MBB40 -MRI of the brain without contrast was normal -Could not tolerate gabapentin, baclofen, Tegretol   I spent 15 minutes with the patient. 50% of this time was spent   Butler Denmark, [place], DNP 05/04/2020, 5:33 AM [facility] [address], [place], [place] [address] [phone]

## 2020-05-06 DIAGNOSIS — Z992 Dependence on renal dialysis: Secondary | ICD-10-CM

## 2020-05-06 DIAGNOSIS — N186 End stage renal disease: Secondary | ICD-10-CM

## 2020-05-06 HISTORY — DX: Dependence on renal dialysis: Z99.2

## 2020-05-06 HISTORY — DX: End stage renal disease: N18.6

## 2020-06-27 ENCOUNTER — Telehealth: Payer: Self-pay | Admitting: *Deleted

## 2020-06-27 NOTE — Telephone Encounter (Signed)
Received referral request form [place], [place], attempting to connect patient to care in [place]. She moved from [place], has been off medication for about 1 year. She has signed a records release at [facility], requesting records from Dr Jolene Provost at [facility] in [place], [place] to assist in transfer. This has been sent twice, no records received yet. RN contacted [facility], left voicemail requesting a call back.  Per patient, she was last seen sometime in 2020. All we would need is proof of positivity to get her into care at [facility].  Let patient know that she can get a positive blood test from PCP or health department. RN will follow up with patient when/if I hear back from [facility]. Phone: [phone]. Landis Gandy, RN

## 2020-06-28 ENCOUNTER — Encounter: Payer: Self-pay | Admitting: Infectious Diseases

## 2020-06-28 ENCOUNTER — Other Ambulatory Visit: Payer: Self-pay | Admitting: Nephrology

## 2020-06-28 ENCOUNTER — Other Ambulatory Visit: Payer: Self-pay

## 2020-06-28 ENCOUNTER — Ambulatory Visit
Admission: RE | Admit: 2020-06-28 | Discharge: 2020-06-28 | Disposition: A | Discharge status: Home / Self Care | Payer: Medicare Other | Source: Ambulatory Visit | Attending: Nephrology | Admitting: Nephrology

## 2020-06-28 DIAGNOSIS — R059 Cough, unspecified: Secondary | ICD-10-CM

## 2020-06-30 ENCOUNTER — Inpatient Hospital Stay (HOSPITAL_COMMUNITY)
Admission: EM | Admit: 2020-06-30 | Discharge: 2020-07-12 | DRG: 673 | Disposition: A | Discharge status: Home / Self Care | Payer: POS | Attending: Internal Medicine | Admitting: Internal Medicine

## 2020-06-30 ENCOUNTER — Inpatient Hospital Stay (HOSPITAL_COMMUNITY): Payer: POS

## 2020-06-30 ENCOUNTER — Encounter (HOSPITAL_COMMUNITY): Payer: Self-pay

## 2020-06-30 ENCOUNTER — Other Ambulatory Visit: Payer: Self-pay

## 2020-06-30 ENCOUNTER — Emergency Department (HOSPITAL_COMMUNITY): Payer: POS

## 2020-06-30 DIAGNOSIS — N185 Chronic kidney disease, stage 5: Secondary | ICD-10-CM

## 2020-06-30 DIAGNOSIS — R197 Diarrhea, unspecified: Secondary | ICD-10-CM | POA: Diagnosis not present

## 2020-06-30 DIAGNOSIS — E872 Acidosis, unspecified: Secondary | ICD-10-CM

## 2020-06-30 DIAGNOSIS — L03114 Cellulitis of left upper limb: Secondary | ICD-10-CM | POA: Diagnosis not present

## 2020-06-30 DIAGNOSIS — E669 Obesity, unspecified: Secondary | ICD-10-CM | POA: Diagnosis present

## 2020-06-30 DIAGNOSIS — Z79899 Other long term (current) drug therapy: Secondary | ICD-10-CM | POA: Diagnosis not present

## 2020-06-30 DIAGNOSIS — N186 End stage renal disease: Secondary | ICD-10-CM | POA: Diagnosis present

## 2020-06-30 DIAGNOSIS — Z7952 Long term (current) use of systemic steroids: Secondary | ICD-10-CM

## 2020-06-30 DIAGNOSIS — Z6837 Body mass index (BMI) 37.0-37.9, adult: Secondary | ICD-10-CM

## 2020-06-30 DIAGNOSIS — G500 Trigeminal neuralgia: Secondary | ICD-10-CM | POA: Diagnosis present

## 2020-06-30 DIAGNOSIS — Z20822 Contact with and (suspected) exposure to covid-19: Secondary | ICD-10-CM | POA: Diagnosis present

## 2020-06-30 DIAGNOSIS — R000 Tachycardia, unspecified: Secondary | ICD-10-CM | POA: Diagnosis present

## 2020-06-30 DIAGNOSIS — B20 Human immunodeficiency virus [HIV] disease: Secondary | ICD-10-CM | POA: Diagnosis present

## 2020-06-30 DIAGNOSIS — Z880 Allergy status to penicillin: Secondary | ICD-10-CM

## 2020-06-30 DIAGNOSIS — Z21 Asymptomatic human immunodeficiency virus [HIV] infection status: Secondary | ICD-10-CM | POA: Diagnosis present

## 2020-06-30 DIAGNOSIS — Z23 Encounter for immunization: Secondary | ICD-10-CM

## 2020-06-30 DIAGNOSIS — A040 Enteropathogenic Escherichia coli infection: Secondary | ICD-10-CM | POA: Insufficient documentation

## 2020-06-30 DIAGNOSIS — D709 Neutropenia, unspecified: Secondary | ICD-10-CM | POA: Diagnosis present

## 2020-06-30 DIAGNOSIS — Z888 Allergy status to other drugs, medicaments and biological substances status: Secondary | ICD-10-CM | POA: Diagnosis not present

## 2020-06-30 DIAGNOSIS — E8809 Other disorders of plasma-protein metabolism, not elsewhere classified: Secondary | ICD-10-CM

## 2020-06-30 DIAGNOSIS — R030 Elevated blood-pressure reading, without diagnosis of hypertension: Secondary | ICD-10-CM | POA: Diagnosis present

## 2020-06-30 DIAGNOSIS — N179 Acute kidney failure, unspecified: Secondary | ICD-10-CM | POA: Diagnosis present

## 2020-06-30 DIAGNOSIS — N2581 Secondary hyperparathyroidism of renal origin: Secondary | ICD-10-CM

## 2020-06-30 DIAGNOSIS — N184 Chronic kidney disease, stage 4 (severe): Secondary | ICD-10-CM | POA: Diagnosis not present

## 2020-06-30 DIAGNOSIS — Z883 Allergy status to other anti-infective agents status: Secondary | ICD-10-CM | POA: Diagnosis not present

## 2020-06-30 DIAGNOSIS — N189 Chronic kidney disease, unspecified: Secondary | ICD-10-CM | POA: Diagnosis not present

## 2020-06-30 DIAGNOSIS — D696 Thrombocytopenia, unspecified: Secondary | ICD-10-CM | POA: Diagnosis present

## 2020-06-30 DIAGNOSIS — E876 Hypokalemia: Secondary | ICD-10-CM | POA: Diagnosis present

## 2020-06-30 DIAGNOSIS — Z9114 Patient's other noncompliance with medication regimen: Secondary | ICD-10-CM

## 2020-06-30 DIAGNOSIS — I120 Hypertensive chronic kidney disease with stage 5 chronic kidney disease or end stage renal disease: Principal | ICD-10-CM | POA: Diagnosis present

## 2020-06-30 HISTORY — DX: Human immunodeficiency virus (HIV) disease: B20

## 2020-06-30 HISTORY — DX: Essential (primary) hypertension: I10

## 2020-06-30 HISTORY — DX: Asymptomatic human immunodeficiency virus (hiv) infection status: Z21

## 2020-06-30 LAB — BASIC METABOLIC PANEL
Anion gap: 11 (ref 5–15)
BUN: 71 mg/dL — ABNORMAL HIGH (ref 6–20)
CO2: 12 mmol/L — ABNORMAL LOW (ref 22–32)
Calcium: 7.3 mg/dL — ABNORMAL LOW (ref 8.9–10.3)
Chloride: 116 mmol/L — ABNORMAL HIGH (ref 98–111)
Creatinine, Ser: 10.02 mg/dL — ABNORMAL HIGH (ref 0.44–1.00)
GFR, Estimated: 4 mL/min — ABNORMAL LOW (ref >60)
Glucose, Bld: 109 mg/dL — ABNORMAL HIGH (ref 70–99)
Potassium: 5.1 mmol/L (ref 3.5–5.1)
Sodium: 139 mmol/L (ref 135–145)

## 2020-06-30 LAB — IRON AND TIBC
Iron: 50 ug/dL (ref 28–170)
Saturation Ratios: 35 % — ABNORMAL HIGH (ref 10.4–31.8)
TIBC: 141 ug/dL — ABNORMAL LOW (ref 250–450)
UIBC: 91 ug/dL

## 2020-06-30 LAB — CBC
HCT: 42.4 % (ref 36.0–46.0)
HCT: 50 % — ABNORMAL HIGH (ref 36.0–46.0)
Hemoglobin: 13.6 g/dL (ref 12.0–15.0)
Hemoglobin: 16 g/dL — ABNORMAL HIGH (ref 12.0–15.0)
MCH: 28.7 pg (ref 26.0–34.0)
MCH: 28.4 pg (ref 26.0–34.0)
MCHC: 32.1 g/dL (ref 30.0–36.0)
MCHC: 32 g/dL (ref 30.0–36.0)
MCV: 89.5 fL (ref 80.0–100.0)
MCV: 88.8 fL (ref 80.0–100.0)
Platelets: 184 10*3/uL (ref 150–400)
Platelets: 123 10*3/uL — ABNORMAL LOW (ref 150–400)
RBC: 4.74 MIL/uL (ref 3.87–5.11)
RBC: 5.63 MIL/uL — ABNORMAL HIGH (ref 3.87–5.11)
RDW: 16.4 % — ABNORMAL HIGH (ref 11.5–15.5)
RDW: 16.4 % — ABNORMAL HIGH (ref 11.5–15.5)
WBC: 3.6 10*3/uL — ABNORMAL LOW (ref 4.0–10.5)
WBC: 2 10*3/uL — ABNORMAL LOW (ref 4.0–10.5)
nRBC: 0 % (ref 0.0–0.2)
nRBC: 0 % (ref 0.0–0.2)

## 2020-06-30 LAB — URINALYSIS, ROUTINE W REFLEX MICROSCOPIC
Bilirubin Urine: NEGATIVE
Glucose, UA: 150 mg/dL — AB
Ketones, ur: NEGATIVE mg/dL
Leukocytes,Ua: NEGATIVE
Nitrite: NEGATIVE
Protein, ur: >=300 mg/dL — AB
Specific Gravity, Urine: 1.018 (ref 1.005–1.030)
pH: 5 (ref 5.0–8.0)

## 2020-06-30 LAB — PHOSPHORUS: Phosphorus: 8.3 mg/dL — ABNORMAL HIGH (ref 2.5–4.6)

## 2020-06-30 LAB — MAGNESIUM: Magnesium: 1.8 mg/dL (ref 1.7–2.4)

## 2020-06-30 LAB — TSH: TSH: 1.379 u[IU]/mL (ref 0.350–4.500)

## 2020-06-30 LAB — RESP PANEL BY RT-PCR (FLU A&B, COVID) ARPGX2
Influenza A by PCR: NEGATIVE
Influenza B by PCR: NEGATIVE
SARS Coronavirus 2 by RT PCR: NEGATIVE

## 2020-06-30 MED ORDER — LACTATED RINGERS IV SOLN: INTRAVENOUS | Status: DC

## 2020-06-30 MED ORDER — ZOLPIDEM TARTRATE 5 MG PO TABS: 5.0000 mg | ORAL_TABLET | Freq: Every evening | ORAL | Status: DC | PRN

## 2020-06-30 MED ORDER — ONDANSETRON HCL 4 MG PO TABS: 4.0000 mg | ORAL_TABLET | Freq: Four times a day (QID) | ORAL | Status: DC | PRN

## 2020-06-30 MED ORDER — DOCUSATE SODIUM 283 MG RE ENEM
1.0000 | ENEMA | RECTAL | Status: DC | PRN
  Filled 2020-06-30: qty 1

## 2020-06-30 MED ORDER — ACETAMINOPHEN 325 MG PO TABS
650.0000 mg | ORAL_TABLET | Freq: Four times a day (QID) | ORAL | Status: DC | PRN
  Administered 2020-07-04 – 2020-07-05 (×2): 650 mg via ORAL
  Filled 2020-06-30 (×2): qty 2

## 2020-06-30 MED ORDER — HYDROXYZINE HCL 25 MG PO TABS: 25.0000 mg | ORAL_TABLET | Freq: Three times a day (TID) | ORAL | Status: DC | PRN

## 2020-06-30 MED ORDER — RENA-VITE PO TABS
1.0000 | ORAL_TABLET | Freq: Every day | ORAL | Status: DC
  Administered 2020-06-30 – 2020-07-11 (×12): 1 via ORAL
  Filled 2020-06-30 (×13): qty 1

## 2020-06-30 MED ORDER — NEPRO/CARBSTEADY PO LIQD
237.0000 mL | Freq: Three times a day (TID) | ORAL | Status: DC | PRN
  Filled 2020-06-30: qty 237

## 2020-06-30 MED ORDER — CAMPHOR-MENTHOL 0.5-0.5 % EX LOTN
1.0000 "application " | TOPICAL_LOTION | Freq: Three times a day (TID) | CUTANEOUS | Status: DC | PRN
  Filled 2020-06-30: qty 222

## 2020-06-30 MED ORDER — SORBITOL 70 % SOLN
30.0000 mL | Status: DC | PRN
  Filled 2020-06-30 (×2): qty 30

## 2020-06-30 MED ORDER — KIDNEY FAILURE BOOK: Freq: Once | Status: DC

## 2020-06-30 MED ORDER — SODIUM CHLORIDE 0.9 % IV BOLUS
500.0000 mL | Freq: Once | INTRAVENOUS | Status: AC
  Administered 2020-06-30: 500 mL via INTRAVENOUS

## 2020-06-30 MED ORDER — CALCIUM CARBONATE ANTACID 1250 MG/5ML PO SUSP
500.0000 mg | Freq: Four times a day (QID) | ORAL | Status: DC | PRN
  Filled 2020-06-30: qty 5

## 2020-06-30 MED ORDER — ACETAMINOPHEN 650 MG RE SUPP: 650.0000 mg | Freq: Four times a day (QID) | RECTAL | Status: DC | PRN

## 2020-06-30 MED ORDER — ONDANSETRON HCL 4 MG/2ML IJ SOLN: 4.0000 mg | Freq: Four times a day (QID) | INTRAMUSCULAR | Status: DC | PRN

## 2020-06-30 MED ORDER — SODIUM BICARBONATE 650 MG PO TABS
1300.0000 mg | ORAL_TABLET | Freq: Three times a day (TID) | ORAL | Status: DC
  Administered 2020-06-30 – 2020-07-10 (×31): 1300 mg via ORAL
  Filled 2020-06-30 (×31): qty 2

## 2020-06-30 MED ORDER — SODIUM CHLORIDE 0.9 % IV BOLUS
1000.0000 mL | Freq: Once | INTRAVENOUS | Status: AC
  Administered 2020-06-30: 1000 mL via INTRAVENOUS

## 2020-06-30 MED ORDER — HEPARIN SODIUM (PORCINE) 5000 UNIT/ML IJ SOLN
5000.0000 [IU] | Freq: Three times a day (TID) | INTRAMUSCULAR | Status: DC
  Administered 2020-06-30 – 2020-07-12 (×34): 5000 [IU] via SUBCUTANEOUS
  Filled 2020-06-30 (×34): qty 1

## 2020-06-30 NOTE — H&P (Addendum)
History and Physical    Brooke Baldwin FVC:[id number] DOB: Dec 27, 1975 DOA: 06/30/2020  PCP: Patient, No Pcp Per    Patient coming from:  home   Chief Complaint:  AKI   HPI: Brooke Baldwin is a 44 y.o. female with medical history significant of HTN , HIV seen in ed for feeling weak and serum creatinine of 10 today. Pt is followed by [facility] for her trigeminal neuralgia. Pt sees Dr. Moshe Cipro for her CKD.  Pt states she has been feeling weak with no energy for past one month.    ED Course:  Vitals:   06/30/20 1132 06/30/20 1215 06/30/20 1245  BP: (!) 144/119 (!) 138/99 (!) 114/101  Pulse: (!) 138 (!) 101 98  Resp: 20 (!) 21 20  Temp: (!) 97.5 F (36.4 C)    TempSrc: Oral    SpO2: 98% 99% 95%  Weight: 92.5 kg    Height: 5\' 2"  (1.575 m)     In ed nephrology has been consulted and labs show elevated creatinine, Lab Results  Component Value Date   CREATININE 10.02 (H) 06/30/2020   CREATININE 2.06 (H) 12/15/2019   CREATININE 1.85 (H) 12/09/2019  cbc shows elevated rdw at 16.4, but no anemia.Home meds reviewed.     Review of Systems:  Review of Systems  Constitutional: Positive for malaise/fatigue.  All other systems reviewed and are negative.    Past Medical History:  Diagnosis Date  . HIV (human immunodeficiency virus infection) ([place])   . Hypertension   . Trigeminal neuralgia     Past Surgical History:  Procedure Laterality Date  . ANKLE FRACTURE SURGERY Right   . CERVIX SURGERY     removal of HPV warts     reports that she has never smoked. She has never used smokeless tobacco. She reports current alcohol use. She reports previous drug use.  Allergies  Allergen Reactions  . Baclofen Other (See Comments)    Caused intolerable dizziness   . Gabapentin Other (See Comments)    Caused intolerable dizziness  . Penicillins Other (See Comments)    Unknown childhood reaction     Family History  Problem Relation Age  of Onset  . Diabetes Mother   . Healthy Father     Prior to Admission medications   Medication Sig Start Date End Date Taking? Authorizing Provider  OXcarbazepine (TRILEPTAL) 150 MG tablet Take 2 tablets (300 mg total) by mouth 2 (two) times daily. Patient not taking: Reported on 04/07/2020 12/09/19   Marcial Pacas, MD  predniSONE (DELTASONE) 20 MG tablet Take 20 mg by mouth 2 (two) times daily.    [provider]  pregabalin (LYRICA) 50 MG capsule Take 1 capsule (50 mg total) by mouth 2 (two) times daily. Patient not taking: Reported on 04/07/2020 12/30/19   Suzzanne Cloud, NP    Physical Exam: Vitals:   06/30/20 1132 06/30/20 1215 06/30/20 1245  BP: (!) 144/119 (!) 138/99 (!) 114/101  Pulse: (!) 138 (!) 101 98  Resp: 20 (!) 21 20  Temp: (!) 97.5 F (36.4 C)    TempSrc: Oral    SpO2: 98% 99% 95%  Weight: 92.5 kg    Height: 5\' 2"  (1.575 m)      Physical Exam Vitals and nursing note reviewed.  Constitutional:      Appearance: She is obese.  HENT:     Head: Normocephalic.     Right Ear: External ear normal.     Left Ear: External  ear normal.     Nose: Nose normal.     Mouth/Throat:     Mouth: Mucous membranes are moist.  Eyes:     Extraocular Movements: Extraocular movements intact.     Pupils: Pupils are equal, round, and reactive to light.  Cardiovascular:     Rate and Rhythm: Normal rate and regular rhythm.     Pulses: Normal pulses.     Heart sounds: Normal heart sounds.  Pulmonary:     Effort: Pulmonary effort is normal.     Breath sounds: Normal breath sounds.  Abdominal:     General: Bowel sounds are normal. There is no distension.     Palpations: Abdomen is soft. There is no mass.     Tenderness: There is no abdominal tenderness. There is no guarding.  Skin:    General: Skin is warm and dry.  Neurological:     General: No focal deficit present.     Mental Status: She is alert and oriented to person, place, and time.  Psychiatric:        Mood and Affect:  Mood normal.        Behavior: Behavior normal.      Labs on Admission: I have personally reviewed following labs and imaging studies Labs  No results for input(s): CKTOTAL, CKMB, TROPONINI in the last 72 hours. Lab Results  Component Value Date   WBC 3.6 (L) 06/30/2020   HGB 13.6 06/30/2020   HCT 42.4 06/30/2020   MCV 89.5 06/30/2020   PLT 184 06/30/2020    Recent Labs  Lab 06/30/20 1133  NA 139  K 5.1  CL 116*  CO2 12*  BUN 71*  CREATININE 10.02*  CALCIUM 7.3*  GLUCOSE 109*   No results found for: CHOL, HDL, LDLCALC, TRIG No results found for: DDIMER Invalid input(s): POCBNP  Urinalysis    Component Value Date/Time   COLORURINE YELLOW 06/30/2020 1245   APPEARANCEUR HAZY (A) 06/30/2020 1245   LABSPEC 1.018 06/30/2020 1245   PHURINE 5.0 06/30/2020 1245   GLUCOSEU 150 (A) 06/30/2020 1245   HGBUR SMALL (A) 06/30/2020 1245   BILIRUBINUR NEGATIVE 06/30/2020 [place] 06/30/2020 1245   PROTEINUR >=300 (A) 06/30/2020 1245   NITRITE NEGATIVE 06/30/2020 1245   LEUKOCYTESUR NEGATIVE 06/30/2020 1245    COVID-19 Labs  No results for input(s): DDIMER, FERRITIN, LDH, CRP in the last 72 hours.  Lab Results  Component Value Date   SARSCOV2NAA NOT DETECTED 04/02/2019    Radiological Exams on Admission: DG Chest Port 1 View  Result Date: 06/30/2020 CLINICAL DATA:  Evaluate for volume overload. EXAM: PORTABLE CHEST 1 VIEW COMPARISON:  06/28/2020 FINDINGS: The heart size and mediastinal contours are within normal limits. Both lungs are clear. The visualized skeletal structures are unremarkable. IMPRESSION: No active disease. Electronically Signed   By: Kerby Moors M.D.   On: 06/30/2020 13:21   Cxr pending.  EKG: Independently reviewed.  Pending.     Assessment/Plan Principal Problem:   Acute kidney injury superimposed on CKD ([place]) Active Problems:   HIV (human immunodeficiency virus infection) ([place])   Right trigeminal neuralgia   Elevated  blood pressure reading without diagnosis of hypertension  AKI on CKD : Lab Results  Component Value Date   CREATININE 10.02 (H) 06/30/2020   CREATININE 2.06 (H) 12/15/2019   CREATININE 1.85 (H) 12/09/2019  Pt denies any NSAIDS or supplements, only takes tylenol as needed.  Started on LR at 40cc/hour x 1 days/ echo.  Avoid  nephrotoxic agents contrasts and meds and renally dose meds.   Nephrology Consulted by edmd-Dr.Singh  HIV: No antiretroviral on  meds in chart review. Pt needs ID to establish care, no MD on call to call consult plz request in am.  Trigeminal neuralgia: Stable.  Elevated bp without htn diagnosis:  we will start pt on hydralazine and monitor bp if persistently elevated then we will start BP meds.     DVT prophylaxis:  Heparin  Code Status:  Full code  Family Communication:  None at bedside   Disposition Plan:  Home   Consults called:  Nephrology.  Admission status: Status is: Inpatient  Remains inpatient appropriate because:Ongoing diagnostic testing needed not appropriate for outpatient work up   Dispo: The patient is from: Home              Anticipated d/c is to: Home              Anticipated d/c date is: 3 days              Patient currently is not medically stable to d/c.  Para Skeans MD [facility] [phone] How to contact the [facility] Attending or Consulting provider [place] or covering provider during after hours [employer], for this patient?    1. Check the care team in [facility] and look for a) attending/consulting [facility] provider listed and b) the [facility] team listed 2. Log into [email or web address] and use [place]'s universal password to access. If you do not have the password, please contact the hospital operator. 3. Locate the [facility] provider you are looking for under [facility] and page to a number that you can be directly reached. 4. If you still have difficulty reaching the provider, please page the [facility] (Director on Call) for the  Hospitalists listed on amion for assistance. [email or web address] Password [email or web address] 06/30/2020, 1:26 PM

## 2020-06-30 NOTE — ED Notes (Signed)
Pt to ED 28.

## 2020-06-30 NOTE — Discharge Instructions (Signed)
Acute Kidney Injury, Adult  Acute kidney injury is a sudden worsening of kidney function. The kidneys are organs that have several jobs. They filter the blood to remove waste products and extra fluid. They also maintain a healthy balance of minerals and hormones in the body, which helps control blood pressure and keep bones strong. With this condition, your kidneys do not do their jobs as well as they should. This condition ranges from mild to severe. Over time it may develop into long-lasting (chronic) kidney disease. Early detection and treatment may prevent acute kidney injury from developing into a chronic condition. What are the causes? Common causes of this condition include:  A problem with blood flow to the kidneys. This may be caused by: ? Low blood pressure (hypotension) or shock. ? Blood loss. ? Heart and blood vessel (cardiovascular) disease. ? Severe burns. ? Liver disease.  Direct damage to the kidneys. This may be caused by: ? Certain medicines. ? A kidney infection. ? Poisoning. ? Being around or in contact with toxic substances. ? A surgical wound. ? A hard, direct hit to the kidney area.  A sudden blockage of urine flow. This may be caused by: ? Cancer. ? Kidney stones. ? An enlarged prostate in males. What are the signs or symptoms? Symptoms of this condition may not be obvious until the condition becomes severe. Symptoms of this condition can include:  Tiredness (lethargy), or difficulty staying awake.  Nausea or vomiting.  Swelling (edema) of the face, legs, ankles, or feet.  Problems with urination, such as: ? Abdominal pain, or pain along the side of your stomach (flank). ? Decreased urine production. ? Decrease in the force of urine flow.  Muscle twitches and cramps, especially in the legs.  Confusion or trouble concentrating.  Loss of appetite.  Fever. How is this diagnosed? This condition may be diagnosed with tests, including:  Blood  tests.  Urine tests.  Imaging tests.  A test in which a sample of tissue is removed from the kidneys to be examined under a microscope (kidney biopsy). How is this treated? Treatment for this condition depends on the cause and how severe the condition is. In mild cases, treatment may not be needed. The kidneys may heal on their own. In more severe cases, treatment will involve:  Treating the cause of the kidney injury. This may involve changing any medicines you are taking or adjusting your dosage.  Fluids. You may need specialized IV fluids to balance your body's needs.  Having a catheter placed to drain urine and prevent blockages.  Preventing problems from occurring. This may mean avoiding certain medicines or procedures that can cause further injury to the kidneys. In some cases treatment may also require:  A procedure to remove toxic wastes from the body (dialysis or continuous renal replacement therapy - CRRT).  Surgery. This may be done to repair a torn kidney, or to remove the blockage from the urinary system. Follow these instructions at home: Medicines  Take over-the-counter and prescription medicines only as told by your health care provider.  Do not take any new medicines without your health care provider's approval. Many medicines can worsen your kidney damage.  Do not take any vitamin and mineral supplements without your health care provider's approval. Many nutritional supplements can worsen your kidney damage. Lifestyle  If your health care provider prescribed changes to your diet, follow them. You may need to decrease the amount of protein you eat.  Achieve and maintain a healthy   weight. If you need help with this, ask your health care provider.  Start or continue an exercise plan. Try to exercise at least 30 minutes a day, 5 days a week.  Do not use any tobacco products, such as cigarettes, chewing tobacco, and e-cigarettes. If you need help quitting, ask your  health care provider. General instructions  Keep track of your blood pressure. Report changes in your blood pressure as told by your health care provider.  Stay up to date with immunizations. Ask your health care provider which immunizations you need.  Keep all follow-up visits as told by your health care provider. This is important. Where to find more information  American Association of Kidney Patients: BombTimer.gl  National Kidney Foundation: www.kidney.[place]: https://mathis.com/  Life Options Rehabilitation Program: ? www.lifeoptions.org ? www.kidneyschool.org Contact a health care provider if:  Your symptoms get worse.  You develop new symptoms. Get help right away if:  You develop symptoms of worsening kidney disease, which include: ? Headaches. ? Abnormally dark or light skin. ? Easy bruising. ? Frequent hiccups. ? Chest pain. ? Shortness of breath. ? End of menstruation in women. ? Seizures. ? Confusion or altered mental status. ? Abdominal or back pain. ? Itchiness.  You have a fever.  Your body is producing less urine.  You have pain or bleeding when you urinate. Summary  Acute kidney injury is a sudden worsening of kidney function.  Acute kidney injury can be caused by problems with blood flow to the kidneys, direct damage to the kidneys, and sudden blockage of urine flow.  Symptoms of this condition may not be obvious until it becomes severe. Symptoms may include edema, lethargy, confusion, nausea or vomiting, and problems passing urine.  This condition can usually be diagnosed with blood tests, urine tests, and imaging tests. Sometimes a kidney biopsy is done to diagnose this condition.  Treatment for this condition often involves treating the underlying cause. It is treated with fluids, medicines, dialysis, diet changes, or surgery. This information is not intended to replace advice given to you by your health care provider. Make  sure you discuss any questions you have with your health care provider. Document Revised: 07/05/2017 Document Reviewed: 07/13/2016 Elsevier Patient Education  [place].   Chronic Kidney Disease, Adult Chronic kidney disease (CKD) occurs when the kidneys become damaged slowly over a long period of time. The kidneys are a pair of organs that do many important jobs in the body, including:  Removing waste and extra fluid from the blood to make urine.  Making hormones that maintain the amount of fluid in tissues and blood vessels.  Maintaining the right amount of fluids and chemicals in the body. A small amount of kidney damage may not cause problems, but a large amount of damage may make it hard or impossible for the kidneys to work the way they should. If steps are not taken to slow down kidney damage or to stop it from getting worse, the kidneys may stop working permanently (end-stage renal disease or ESRD). Most of the time, CKD does not go away, but it can often be controlled. People who have CKD are usually able to live normal lives. What are the causes? The most common causes of this condition are diabetes and high blood pressure (hypertension). Other causes include:  Heart and blood vessel (cardiovascular) disease.  Kidney diseases, such as: ? Glomerulonephritis. ? Interstitial nephritis. ? Polycystic kidney disease. ? Renal vascular disease.  Diseases that  affect the immune system.  Genetic diseases.  Medicines that damage the kidneys, such as anti-inflammatory medicines.  Being around or being in contact with poisonous (toxic) substances.  A kidney or urinary infection that occurs again and again (recurs).  Vasculitis. This is swelling or inflammation of the blood vessels.  A problem with urine flow that may be caused by: ? Cancer. ? Having kidney stones more than one time. ? An enlarged prostate, in males. What increases the risk? You are more likely to  develop this condition if you:  Are older than age 24.  Are female.  Are African-American, Hispanic, Asian, [place], or American [place].  Are a current or former smoker.  Are obese.  Have a family history of kidney disease or failure.  Often take medicines that are damaging to the kidneys. What are the signs or symptoms? Symptoms of this condition include:  Swelling (edema) of the face, legs, ankles, or feet.  Tiredness (lethargy) and having less energy.  Nausea or vomiting.  Confusion or trouble concentrating.  Problems with urination, such as: ? Painful or burning feeling during urination. ? Decreased urine production. ? Frequent urination, especially at night. ? Bloody urine.  Muscle twitches and cramps, especially in the legs.  Shortness of breath.  Weakness.  Loss of appetite.  Metallic taste in the mouth.  Trouble sleeping.  Dry, itchy skin.  A low blood count (anemia).  Pale lining of the eyelids and surface of the eye (conjunctiva). Symptoms develop slowly and may not be obvious until the kidney damage becomes severe. It is possible to have kidney disease for years without having any symptoms. How is this diagnosed? This condition may be diagnosed based on:  Blood tests.  Urine tests.  Imaging tests, such as an ultrasound or CT scan.  A test in which a sample of tissue is removed from the kidneys to be examined under a microscope (kidney biopsy). These test results will help your health care provider determine how serious the CKD is. How is this treated? There is no cure for most cases of this condition, but treatment usually relieves symptoms and prevents or slows the progression of the disease. Treatment may include:  Making diet changes, which may require you to avoid alcohol, salty foods (sodium), and foods that are high in potassium, calcium, and protein.  Medicines: ? To lower blood pressure. ? To control blood glucose. ? To  relieve anemia. ? To relieve swelling. ? To protect your bones. ? To improve the balance of electrolytes in your blood.  Removing toxic waste from the body through types of dialysis, if the kidneys can no longer do their job (kidney failure).  Managing any other conditions that are causing your CKD or making it worse. Follow these instructions at home: Medicines  Take over-the-counter and prescription medicines only as told by your health care provider. The dose of some medicines that you take may need to be adjusted.  Do not take any new medicines unless approved by your health care provider. Many medicines can worsen your kidney damage.  Do not take any vitamin and mineral supplements unless approved by your health care provider. Many nutritional supplements can worsen your kidney damage. General instructions  Follow your prescribed diet as told by your health care provider.  Do not use any products that contain nicotine or tobacco, such as cigarettes and e-cigarettes. If you need help quitting, ask your health care provider.  Monitor and track your blood pressure at home.  Report changes in your blood pressure as told by your health care provider.  If you are being treated for diabetes, monitor and track your blood sugar (blood glucose) levels as told by your health care provider.  Maintain a healthy weight. If you need help with this, ask your health care provider.  Start or continue an exercise plan. Exercise at least 30 minutes a day, 5 days a week.  Keep your immunizations up to date as told by your health care provider.  Keep all follow-up visits as told by your health care provider. This is important. Where to find more information  American Association of Kidney Patients: BombTimer.gl  National Kidney Foundation: www.kidney.[place]: https://mathis.com/  Life Options Rehabilitation Program: www.lifeoptions.org and www.kidneyschool.org Contact a health  care provider if:  Your symptoms get worse.  You develop new symptoms. Get help right away if:  You develop symptoms of ESRD, which include: ? Headaches. ? Numbness in the hands or feet. ? Easy bruising. ? Frequent hiccups. ? Chest pain. ? Shortness of breath. ? Lack of menstruation, in women.  You have a fever.  You have decreased urine production.  You have pain or bleeding when you urinate. Summary  Chronic kidney disease (CKD) occurs when the kidneys become damaged slowly over a long period of time.  The most common causes of this condition are diabetes and high blood pressure (hypertension).  There is no cure for most cases of this condition, but treatment usually relieves symptoms and prevents or slows the progression of the disease. Treatment may include a combination of medicines and lifestyle changes. This information is not intended to replace advice given to you by your health care provider. Make sure you discuss any questions you have with your health care provider. Document Revised: 07/05/2017 Document Reviewed: 08/30/2016 Elsevier Patient Education  2020 [place] for Chronic Kidney Disease When your kidneys are not working well, they cannot remove waste and excess substances from your blood as effectively as they did before. This can lead to a buildup and imbalance of these substances, which can worsen kidney damage and affect how your body functions. Certain foods lead to a buildup of these substances in the body. By changing your diet as recommended by your diet and nutrition specialist (dietitian) or health care provider, you could help prevent further kidney damage and delay or prevent the need for dialysis. What are tips for following this plan? General instructions   Work with your health care provider and dietitian to develop a meal plan that is right for you. Foods you can eat, limit, or avoid will be different for each person depending  on the stage of kidney disease and any other existing health conditions.  Talk with your health care provider about whether you should take a vitamin and mineral supplement.  Use standard measuring cups and spoons to measure servings of foods. Use a kitchen scale to measure portions of protein foods.  If directed by your health care provider, avoid drinking too much fluid. Measure and count all liquids, including water, ice, soups, flavored gelatin, and frozen desserts such as popsicles or ice cream. Reading food labels  Check the amount of sodium in foods. Choose foods that have less than 300 milligrams (mg) per serving.  Check the ingredient list for phosphorus or potassium-based additives or preservatives.  Check the amount of saturated and trans fat. Limit or avoid these fats as told by your dietitian. Shopping  Avoid  buying foods that are: ? Processed, frozen, or prepackaged. ? Calcium-enriched or fortified.  Do not buy foods that have salt or sodium listed among the first five ingredients.  Do not buy canned vegetables. Cooking  Replace animal proteins, such as meat, fish, eggs, or dairy, with plant proteins from beans, nuts, and soy. ? Use soy milk instead of cow's milk. ? Add beans or tofu to soups, casseroles, or pasta dishes instead of meat.  Soak vegetables, such as potatoes, before cooking to reduce potassium. To do this: ? Peel and cut into small pieces. ? Soak in warm water for at least 2 hours. For every 1 cup of vegetables, use 10 cups of water. ? Drain and rinse with warm water. ? Boil for at least 5 minutes. Meal planning  Limit the amount of protein from plant and animal sources you eat each day.  Do not add salt to food when cooking or before eating.  Eat meals and snacks at around the same time each day. If you have diabetes:  If you have diabetes (diabetes mellitus) and chronic kidney disease, it is important to keep your blood glucose in the target  range recommended by your health care provider. Follow your diabetes management plan. This may include: ? Checking your blood glucose regularly. ? Taking oral medicines, insulin, or both. ? Exercising for at least 30 minutes on 5 or more days each week, or as told by your health care provider. ? Tracking how many servings of carbohydrates you eat at each meal.  You may be given specific guidelines on how much of certain foods and nutrients you may eat, depending on your stage of kidney disease and whether you have high blood pressure (hypertension). Follow your meal plan as told by your dietitian. What nutrients should be limited? The items listed are not a complete list. Talk with your dietitian about what dietary choices are best for you. Potassium Potassium affects how steadily your heart beats. If too much potassium builds up in your blood, it can cause an irregular heartbeat or even a heart attack. You may need to eat less potassium, depending on your blood potassium levels and the stage of kidney disease. Talk to your dietitian about how much potassium you may have each day. You may need to limit or avoid foods that are high in potassium, such as:  Milk and soy milk.  Fruits, such as bananas, papaya, apricots, nectarines, melon, prunes, raisins, kiwi, and oranges.  Vegetables, such as potatoes, sweet potatoes, yams, tomatoes, leafy greens, beets, okra, avocado, pumpkin, and winter squash.  White and lima beans. Phosphorus Phosphorus is a mineral found in your bones. A balance between calcium and phosphorous is needed to build and maintain healthy bones. Too much phosphorus pulls calcium from your bones. This can make your bones weak and more likely to break. Too much phosphorus can also make your skin itch. You may need to eat less phosphorus depending on your blood phosphorus levels and the stage of kidney disease. Talk to your dietitian about how much potassium you may have each day.  You may need to take medicine to lower your blood phosphorus levels if diet changes do not help. You may need to limit or avoid foods that are high in phosphorus, such as:  Milk and dairy products.  Dried beans and peas.  Tofu, soy milk, and other soy-based meat replacements.  [place].  Nuts and peanut butter.  Meat, poultry, and fish.  Bran cereals and oatmeals.  Protein Protein helps you to make and keep muscle. It also helps in the repair of your body's cells and tissues. One of the natural breakdown products of protein is a waste product called urea. When your kidneys are not working properly, they cannot remove wastes, such as urea, like they did before you developed chronic kidney disease. Reducing how much protein you eat can help prevent a buildup of urea in your blood. Depending on your stage of kidney disease, you may need to limit foods that are high in protein. Sources of animal protein include:  Meat (all types).  Fish and seafood.  Poultry.  Eggs.  Dairy. Other protein foods include:  Beans and legumes.  Nuts and nut butter.  Soy and tofu. Sodium Sodium, which is found in salt, helps maintain a healthy balance of fluids in your body. Too much sodium can increase your blood pressure and have a negative effect on the function of your heart and lungs. Too much sodium can also cause your body to retain too much fluid, making your kidneys work harder. Most people should have less than 2,300 milligrams (mg) of sodium each day. If you have hypertension, you may need to limit your sodium to 1,500 mg each day. Talk to your dietitian about how much sodium you may have each day. You may need to limit or avoid foods that are high in sodium, such as:  Salt seasonings.  Soy sauce.  Cured and processed meats.  Salted crackers and snack foods.  Fast food.  Canned soups and most canned foods.  Pickled foods.  Vegetable juice.  Boxed mixes or ready-to-eat boxed meals  and side dishes.  Bottled dressings, sauces, and marinades. Summary  Chronic kidney disease can lead to a buildup and imbalance of waste and excess substances in the body. Certain foods lead to a buildup of these substances. By adjusting your intake of these foods, you could help prevent more kidney damage and delay or prevent the need for dialysis.  Food adjustments are different for each person with chronic kidney disease. Work with a dietitian to set up nutrient goals and a meal plan that is right for you.  If you have diabetes and chronic kidney disease, it is important to keep your blood glucose in the target range recommended by your health care provider. This information is not intended to replace advice given to you by your health care provider. Make sure you discuss any questions you have with your health care provider. Document Revised: 11/13/2018 Document Reviewed: 07/18/2016 Elsevier Patient Education  2020 [employer].

## 2020-06-30 NOTE — ED Provider Notes (Addendum)
[facility] EMERGENCY DEPARTMENT Provider Note   CSN: [id number] Arrival date & time: 06/30/20  1126     History Chief Complaint  Patient presents with  . Abnormal Lab    Brooke Baldwin is a 44 y.o. female.  HPI     43 year old female comes in a chief complaint of abnormal labs and persistent weakness.  Patient has been diagnosed with CKD and has been seen by nephrology team for it..  She has been having increased weakness and was seen by nephrology for repeat labs recently.  She received a call from them stating that her kidney function has worsened dramatically and that if she continues to feel bad that she should come to the ER.  Past Medical History:  Diagnosis Date  . HIV (human immunodeficiency virus infection) ([place])   . Hypertension   . Trigeminal neuralgia     Patient Active Problem List   Diagnosis Date Noted  . HIV (human immunodeficiency virus infection) ([place]) 06/30/2020  . Acute kidney injury superimposed on CKD ([place]) 06/30/2020  . Right trigeminal neuralgia 12/09/2019    Past Surgical History:  Procedure Laterality Date  . ANKLE FRACTURE SURGERY Right   . CERVIX SURGERY     removal of HPV warts     OB History   No obstetric history on file.     Family History  Problem Relation Age of Onset  . Diabetes Mother   . Healthy Father     Social History   Tobacco Use  . Smoking status: Never Smoker  . Smokeless tobacco: Never Used  Vaping Use  . Vaping Use: Never used  Substance Use Topics  . Alcohol use: Yes    Comment: occasional  . Drug use: Not Currently    Home Medications Prior to Admission medications   Not on File    Allergies    Baclofen, Gabapentin, and Penicillins  Review of Systems   Review of Systems  Constitutional: Positive for activity change and fatigue.  Respiratory: Negative for shortness of breath.   Cardiovascular: Negative for chest pain.  Gastrointestinal: Negative for nausea and  vomiting.  Musculoskeletal: Positive for myalgias.  All other systems reviewed and are negative.   Physical Exam Updated Vital Signs BP (!) 144/119   Pulse (!) 138   Temp (!) 97.5 F (36.4 C) (Oral)   Resp 20   Ht 5\' 2"  (1.575 m)   Wt 92.5 kg   LMP 04/02/2015 (Approximate)   SpO2 98%   BMI 37.31 kg/m   Physical Exam Vitals and nursing note reviewed.  Constitutional:      Appearance: She is well-developed.  HENT:     Head: Normocephalic and atraumatic.  Cardiovascular:     Rate and Rhythm: Tachycardia present.  Pulmonary:     Effort: Pulmonary effort is normal.  Abdominal:     General: Bowel sounds are normal.  Musculoskeletal:     Cervical back: Normal range of motion and neck supple.  Skin:    General: Skin is warm and dry.  Neurological:     Mental Status: She is alert and oriented to person, place, and time.     ED Results / Procedures / Treatments   Labs (all labs ordered are listed, but only abnormal results are displayed) Labs Reviewed  CBC - Abnormal; Notable for the following components:      Result Value   WBC 3.6 (*)    RDW 16.4 (*)    All other components within  normal limits  BASIC METABOLIC PANEL - Abnormal; Notable for the following components:   Chloride 116 (*)    CO2 12 (*)    Glucose, Bld 109 (*)    BUN 71 (*)    Creatinine, Ser 10.02 (*)    Calcium 7.3 (*)    GFR, Estimated 4 (*)    All other components within normal limits  PHOSPHORUS - Abnormal; Notable for the following components:   Phosphorus 8.3 (*)    All other components within normal limits  RESP PANEL BY RT-PCR (FLU A&B, COVID) ARPGX2  MAGNESIUM  URINALYSIS, ROUTINE W REFLEX MICROSCOPIC  TSH    EKG EKG Interpretation  Date/Time:  Thursday June 30 2020 11:29:33 EST Ventricular Rate:  132 PR Interval:  116 QRS Duration: 66 QT Interval:  302 QTC Calculation: 447 R Axis:   -4 Text Interpretation: Sinus tachycardia Minimal voltage criteria for LVH, may be normal  variant ( R in aVL ) Inferior infarct , age undetermined Anterior infarct , age undetermined Abnormal ECG No acute changes Confirmed by Varney Biles [phone]) on 06/30/2020 12:40:06 PM    EKG Interpretation  Date/Time:  Thursday June 30 2020 13:05:19 EST Ventricular Rate:  100 PR Interval:  116 QRS Duration: 82 QT Interval:  347 QTC Calculation: 448 R Axis:   1 Text Interpretation: Sinus tachycardia Low voltage, precordial leads Abnormal R-wave progression, early transition Left ventricular hypertrophy Borderline T abnormalities, diffuse leads No acute changes Confirmed by Varney Biles [phone]) on 06/30/2020 3:04:54 PM        Radiology No results found.  Procedures Procedures (including critical care time)  Medications Ordered in ED Medications  sodium chloride 0.9 % bolus 500 mL (500 mLs Intravenous New Bag/Given 06/30/20 1241)  sodium chloride 0.9 % bolus 500 mL (500 mLs Intravenous New Bag/Given 06/30/20 1253)    ED Course  I have reviewed the triage vital signs and the nursing notes.  Pertinent labs & imaging results that were available during my care of the patient were reviewed by me and considered in my medical decision making (see chart for details).    MDM Rules/Calculators/A&P                          44 year old female comes in a chief complaint of weakness and worsening renal labs.  She was seen recently at nephrology clinic and was informed that her kidney function has worsened.  It was suspect that her worsening weakness was likely because of her worsening renal function and she was advised to come to the ER if her symptoms progressed.  Patient denies nausea, vomiting.  She has slightly reduced p.o. intake and nonspecific myalgias.  No chest pain, shortness of breath.  She has been taking medication as prescribed.  She has been taken off of prednisone since the last 2 months.  Patient is noted to be tachycardic.  We will repeat EKG.  History is not  suggestive of infection. Pt has no hx of PE, DVT and denies any exogenous hormone (testosterone / estrogen) use, long distance travels or surgery in the past 6 weeks, active cancer, recent immobilization.  3:08 PM I discussed case with Dr. Candiss Norse, nephrology.  He was going to put recommendations.  1 L of normal saline ordered, but my suspicion is that the etiology is unlikely to be prerenal. Medicine will admit. 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, Maanya Hippert, MD 06/30/20 [phone]

## 2020-06-30 NOTE — Plan of Care (Signed)

## 2020-06-30 NOTE — Plan of Care (Signed)
  Problem: Education: Goal: Knowledge of disease and its progression will improve 06/30/2020 1636 by Arlina Robes, RN Outcome: Progressing 06/30/2020 1635 by Arlina Robes, RN Outcome: Progressing Goal: Individualized Educational Video(s) 06/30/2020 1636 by Arlina Robes, RN Outcome: Progressing 06/30/2020 1635 by Arlina Robes, RN Outcome: Progressing   Problem: Fluid Volume: Goal: Compliance with measures to maintain balanced fluid volume will improve 06/30/2020 1636 by Arlina Robes, RN Outcome: Progressing 06/30/2020 1635 by Arlina Robes, RN Outcome: Progressing   Problem: Health Behavior/Discharge Planning: Goal: Ability to manage health-related needs will improve 06/30/2020 1636 by Arlina Robes, RN Outcome: Progressing 06/30/2020 1635 by Arlina Robes, RN Outcome: Progressing   Problem: Nutritional: Goal: Ability to make healthy dietary choices will improve 06/30/2020 1636 by Arlina Robes, RN Outcome: Progressing 06/30/2020 1635 by Arlina Robes, RN Outcome: Progressing   Problem: Clinical Measurements: Goal: Complications related to the disease process, condition or treatment will be avoided or minimized 06/30/2020 1636 by Arlina Robes, RN Outcome: Progressing 06/30/2020 1635 by Arlina Robes, RN Outcome: Progressing   Problem: Education: Goal: Knowledge of General Education information will improve Description: Including pain rating scale, medication(s)/side effects and non-pharmacologic comfort measures Outcome: Progressing   Problem: Health Behavior/Discharge Planning: Goal: Ability to manage health-related needs will improve Outcome: Progressing   Problem: Clinical Measurements: Goal: Ability to maintain clinical measurements within normal limits will improve Outcome: Progressing Goal: Will remain free from infection Outcome: Progressing Goal: Diagnostic test results will  improve Outcome: Progressing Goal: Respiratory complications will improve Outcome: Progressing Goal: Cardiovascular complication will be avoided Outcome: Progressing   Problem: Activity: Goal: Risk for activity intolerance will decrease Outcome: Progressing   Problem: Nutrition: Goal: Adequate nutrition will be maintained Outcome: Progressing   Problem: Coping: Goal: Level of anxiety will decrease Outcome: Progressing   Problem: Elimination: Goal: Will not experience complications related to bowel motility Outcome: Progressing Goal: Will not experience complications related to urinary retention Outcome: Progressing   Problem: Pain Managment: Goal: General experience of comfort will improve Outcome: Progressing   Problem: Safety: Goal: Ability to remain free from injury will improve Outcome: Progressing   Problem: Skin Integrity: Goal: Risk for impaired skin integrity will decrease Outcome: Progressing

## 2020-06-30 NOTE — Consult Note (Addendum)
Nephrology Consult  [facility]  Requesting provider: Para Skeans, MD  Assessment/Recommendations: Brooke Baldwin is a/an 44 y.o. 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 Worsening kidney function may be secondary to progression of disease. Had labs done on 11/23 which revealed a Cr of 9.1 (previously 2.6 back in August) -s/p renal biopsy 04/15/20: FSGS, negative IF, no IC deposits on EM, mild glomerulomegaly, moderate arteriosclerosis with diffuse mild to moderate tubulointerstitial scarring, no evidence of GN -patient had taken her self off losartan for quite some time -did not tolerate prednisone in the past, do not believe that there is any utility for steroids at this time -UA w/ microscopy -fluids: 1L of NS now, followed by an increase in mIVF to 100cc/hr (suspecting she is relatively volume down, hgb higher than before) -if no improvement, will need to start dialysis while she's here. Will tentatively keep her NPO for a possible catheter if there is no improvement with fluids. Discussed dialysis with her and she agrees to start renal replacement therapy in-house -renal ultrasound -restrict K in diet -Continue to monitor daily Cr, Dose meds for GFR<15 -Monitor Daily I/Os, Daily weight  -Maintain MAP>65 for optimal renal perfusion.  -Agree with holding ACE-I, avoid further nephrotoxins including NSAIDS, Morphine.  Unless absolutely necessary, avoid CT with contrast and/or MRI with gadolinium.     Metabolic Acidosis -check VBG -starting nahco3 1300mg  TID  Hypertension: -patient had taken herself off ARB in the past but was normotensive at her last office visit. Would recommend starting norvasc  HIV -please consult ID, needs to establish care. Has been  trying to get in with ID as an outpatient. Abs CD4 checked at the office and was 5.  Hypoalbuminemia -check albumin here (need to correct AG and calcium levels as well). Last alb 1.8 on 11/23. Push protein   Secondary Hyperparathyroidism -monitor phos and cal. Restrict phos in diet.  Leukopenia -check differential, possibly secondary to HIV/bone marrow suppression? Mgmt per primary service  Thrombocytopenia -monitor, no sequelae of bleeding. 2/2 HIV/marrow suppression?   [facility] 06/30/2020 3:31 PM   _____________________________________________________________________________________   History of Present Illness: Brooke Baldwin is a 44 y.o. 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  Found to have worsening kidney function (Cr 10). Follows with Dr. Moshe Cipro ([facility]), last seen a couple of days ago and found to have worsening kidney function at that time as well (Cr 9.1) along with hypoalbuminemia (1.8). Previously was on low dose losartan but had self-discontinued it about 2 weeks ago. Was also on prednisone prior to that however she was not able to tolerate it for a full course due to facial edema and anxiety. She did report really good appetite while she was on prednisone however since being off prednisone she has been struggling to eat 3 meals a day.  Since she learned about her low albumin levels she is now pushing herself to eat 3 meals at least per day.  She feels like she is relatively dehydrated. Other than  generalized weakness she denies any other complaints. Denies fevers, chills increased WOB/SOB, chest pain, dizziness, dysgeusia, brain fog, hiccups, pruritus, nausea/vomiting.   Medications:   Current Facility-Administered Medications  Medication Dose Route Frequency Provider Last Rate Last Admin  . acetaminophen (TYLENOL) tablet 650 mg  650 mg Oral Q6H PRN Para Skeans, MD       Or  . acetaminophen (TYLENOL) suppository 650 mg  650 mg Rectal Q6H PRN Para Skeans, MD      . calcium carbonate (dosed in mg elemental calcium) suspension 500 mg of elemental calcium  500 mg of elemental calcium Oral Q6H PRN Para Skeans, MD      . camphor-menthol [facility]) lotion 1 application  1 application Topical J0D PRN Para Skeans, MD       And  . hydrOXYzine (ATARAX/VISTARIL) tablet 25 mg  25 mg Oral Q8H PRN Para Skeans, MD      . docusate sodium (ENEMEEZ) enema 283 mg  1 enema Rectal PRN Para Skeans, MD      . feeding supplement (NEPRO CARB STEADY) liquid 237 mL  237 mL Oral TID PRN Para Skeans, MD      . heparin injection 5,000 Units  5,000 Units Subcutaneous Q8H Para Skeans, MD   5,000 Units at 06/30/20 1446  . kidney failure book   Does not apply Once Para Skeans, MD      . lactated ringers infusion   Intravenous Continuous Para Skeans, MD      . multivitamin (RENA-VIT) tablet 1 tablet  1 tablet Oral QHS Florina Ou V, MD      . ondansetron (ZOFRAN) tablet 4 mg  4 mg Oral Q6H PRN Para Skeans, MD       Or  . ondansetron (ZOFRAN) injection 4 mg  4 mg Intravenous Q6H PRN Para Skeans, MD      . sorbitol 70 % solution 30 mL  30 mL Oral PRN Para Skeans, MD      . zolpidem (AMBIEN) tablet 5 mg  5 mg Oral QHS PRN Para Skeans, MD       No current outpatient medications on file.     ALLERGIES Baclofen, Gabapentin, and Penicillins  MEDICAL HISTORY Past Medical History:  Diagnosis Date  . HIV (human immunodeficiency virus infection) ([place])   . Hypertension   . Trigeminal neuralgia      SOCIAL HISTORY Social History   Socioeconomic History  . Marital status: Single    Spouse name: Not on file  . Number of children: 0  . Years of education: college  . Highest  education level: Bachelor's degree (e.g., BA, AB, BS)  Occupational History  . Occupation: Administrator, arts  Tobacco Use  . Smoking status: Never Smoker  . Smokeless tobacco: Never Used  Vaping Use  . Vaping Use: Never used  Substance and Sexual Activity  . Alcohol use: Yes    Comment: occasional  . Drug use: Not Currently  . Sexual activity: Not Currently    Birth control/protection: None  Other Topics Concern  . Not on file  Social History Narrative   Lives at home with significant other.   Right-handed.   No daily caffeine use.   Social Determinants of Health   Financial Resource Strain:   . Difficulty of Paying Living Expenses: Not on file  Food Insecurity:   . Worried About Charity fundraiser in the Last Year: Not on file  .  Ran Out of Food in the Last Year: Not on file  Transportation Needs:   . Lack of Transportation (Medical): Not on file  . Lack of Transportation (Non-Medical): Not on file  Physical Activity:   . Days of Exercise per Week: Not on file  . Minutes of Exercise per Session: Not on file  Stress:   . Feeling of Stress : Not on file  Social Connections:   . Frequency of Communication with Friends and Family: Not on file  . Frequency of Social Gatherings with Friends and Family: Not on file  . Attends Religious Services: Not on file  . Active Member of Clubs or Organizations: Not on file  . Attends Archivist Meetings: Not on file  . Marital Status: Not on file  Intimate Partner Violence:   . Fear of Current or Ex-Partner: Not on file  . Emotionally Abused: Not on file  . Physically Abused: Not on file  . Sexually Abused: Not on file     FAMILY HISTORY Family History  Problem Relation Age of Onset  . Diabetes Mother   . Healthy Father      Review of Systems: 12 systems reviewed Otherwise as per HPI, all other systems reviewed and negative  Physical Exam: Vitals:   06/30/20 1245 06/30/20 1345  BP: (!) 114/101 (!) 112/99  Pulse:  98 96  Resp: 20 20  Temp:    SpO2: 95% 99%   No intake/output data recorded. No intake or output data in the 24 hours ending 06/30/20 1531 General: well-appearing, no acute distress, sitting up in bed HEENT: anicteric sclera, oropharynx clear without lesions CV: regular rate, normal rhythm, +systolic murmur, no gallops, no rubs Lungs: clear to auscultation bilaterally, normal work of breathing, bilateral chest expansion Abd: soft, non-tender, non-distended Skin: no visible lesions or rashes Psych: alert, engaged, appropriate mood and affect Musculoskeletal: no obvious deformities Neuro: normal speech, no gross focal deficits, no asterixis  Test Results Reviewed Lab Results  Component Value Date   NA 139 06/30/2020   K 5.1 06/30/2020   CL 116 (H) 06/30/2020   CO2 12 (L) 06/30/2020   BUN 71 (H) 06/30/2020   CREATININE 10.02 (H) 06/30/2020   CALCIUM 7.3 (L) 06/30/2020   ALBUMIN 4.4 12/09/2019   PHOS 8.3 (H) 06/30/2020     I have reviewed all relevant outside healthcare records related to the patient's kidney injury.

## 2020-06-30 NOTE — ED Triage Notes (Signed)
Pt arrives to ED w/ c/o increased lethargy. Pt diagnosed w/ CKD and was called by PCP and told kidney labs have continued to worsen. PCP told pt that if she was not feeling well to come to ED. Pt denies increased edema and SOB. Pt has hx of HTN, HIV.

## 2020-07-01 DIAGNOSIS — E872 Acidosis, unspecified: Secondary | ICD-10-CM

## 2020-07-01 DIAGNOSIS — N2581 Secondary hyperparathyroidism of renal origin: Secondary | ICD-10-CM

## 2020-07-01 DIAGNOSIS — D709 Neutropenia, unspecified: Secondary | ICD-10-CM

## 2020-07-01 DIAGNOSIS — N179 Acute kidney failure, unspecified: Secondary | ICD-10-CM | POA: Diagnosis not present

## 2020-07-01 DIAGNOSIS — B20 Human immunodeficiency virus [HIV] disease: Secondary | ICD-10-CM | POA: Diagnosis not present

## 2020-07-01 DIAGNOSIS — E8809 Other disorders of plasma-protein metabolism, not elsewhere classified: Secondary | ICD-10-CM

## 2020-07-01 DIAGNOSIS — D696 Thrombocytopenia, unspecified: Secondary | ICD-10-CM

## 2020-07-01 HISTORY — DX: Thrombocytopenia, unspecified: D69.6

## 2020-07-01 LAB — PTH, INTACT AND CALCIUM: Calcium, Total (PTH): 7 mg/dL — ABNORMAL LOW (ref 8.7–10.2)

## 2020-07-01 LAB — RENAL FUNCTION PANEL
Albumin: 1 g/dL — ABNORMAL LOW (ref 3.5–5.0)
Anion gap: 12 (ref 5–15)
BUN: 63 mg/dL — ABNORMAL HIGH (ref 6–20)
CO2: 10 mmol/L — ABNORMAL LOW (ref 22–32)
Calcium: 7 mg/dL — ABNORMAL LOW (ref 8.9–10.3)
Chloride: 118 mmol/L — ABNORMAL HIGH (ref 98–111)
Creatinine, Ser: 9.24 mg/dL — ABNORMAL HIGH (ref 0.44–1.00)
GFR, Estimated: 5 mL/min — ABNORMAL LOW (ref >60)
Glucose, Bld: 85 mg/dL (ref 70–99)
Phosphorus: 8 mg/dL — ABNORMAL HIGH (ref 2.5–4.6)
Potassium: 4.1 mmol/L (ref 3.5–5.1)
Sodium: 140 mmol/L (ref 135–145)

## 2020-07-01 LAB — BASIC METABOLIC PANEL
Anion gap: 10 (ref 5–15)
BUN: 68 mg/dL — ABNORMAL HIGH (ref 6–20)
CO2: 13 mmol/L — ABNORMAL LOW (ref 22–32)
Calcium: 6.9 mg/dL — ABNORMAL LOW (ref 8.9–10.3)
Chloride: 118 mmol/L — ABNORMAL HIGH (ref 98–111)
Creatinine, Ser: 9.44 mg/dL — ABNORMAL HIGH (ref 0.44–1.00)
GFR, Estimated: 5 mL/min — ABNORMAL LOW (ref >60)
Glucose, Bld: 87 mg/dL (ref 70–99)
Potassium: 4.2 mmol/L (ref 3.5–5.1)
Sodium: 141 mmol/L (ref 135–145)

## 2020-07-01 LAB — CBC
HCT: 34.5 % — ABNORMAL LOW (ref 36.0–46.0)
Hemoglobin: 11.5 g/dL — ABNORMAL LOW (ref 12.0–15.0)
MCH: 28.9 pg (ref 26.0–34.0)
MCHC: 33.3 g/dL (ref 30.0–36.0)
MCV: 86.7 fL (ref 80.0–100.0)
Platelets: 158 10*3/uL (ref 150–400)
RBC: 3.98 MIL/uL (ref 3.87–5.11)
RDW: 15.9 % — ABNORMAL HIGH (ref 11.5–15.5)
WBC: 3 10*3/uL — ABNORMAL LOW (ref 4.0–10.5)
nRBC: 0 % (ref 0.0–0.2)

## 2020-07-01 LAB — T-HELPER CELLS (CD4) COUNT (NOT AT ARMC)
CD4 % Helper T Cell: 2 % — ABNORMAL LOW (ref 33–65)
CD4 T Cell Abs: <35 /uL — ABNORMAL LOW (ref 400–1790)

## 2020-07-01 LAB — HIV ANTIBODY (ROUTINE TESTING W REFLEX): HIV Screen 4th Generation wRfx: REACTIVE — AB

## 2020-07-01 MED ORDER — SEVELAMER CARBONATE 800 MG PO TABS
800.0000 mg | ORAL_TABLET | Freq: Three times a day (TID) | ORAL | Status: DC
  Administered 2020-07-01 – 2020-07-11 (×31): 800 mg via ORAL
  Filled 2020-07-01 (×32): qty 1

## 2020-07-01 MED ORDER — ALBUMIN HUMAN 25 % IV SOLN
25.0000 g | Freq: Two times a day (BID) | INTRAVENOUS | Status: DC
  Administered 2020-07-01 – 2020-07-02 (×4): 25 g via INTRAVENOUS
  Filled 2020-07-01 (×5): qty 100

## 2020-07-01 MED ORDER — GUAIFENESIN-DM 100-10 MG/5ML PO SYRP
10.0000 mL | ORAL_SOLUTION | Freq: Four times a day (QID) | ORAL | Status: DC | PRN
  Administered 2020-07-01 – 2020-07-03 (×5): 10 mL via ORAL
  Filled 2020-07-01 (×5): qty 10

## 2020-07-01 MED ORDER — SODIUM BICARBONATE 8.4 % IV SOLN
INTRAVENOUS | Status: DC
  Filled 2020-07-01 (×5): qty 850

## 2020-07-01 NOTE — Assessment & Plan Note (Addendum)
-  No prior thrombocytopenia noted on previous labs that are available for review.  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

## 2020-07-01 NOTE — Hospital Course (Addendum)
Brooke Baldwin is a 44 yo female with PMH CKD (progressive decline, currently stage V), HIV (from [place], no prior records), HTN, TGN who presented with worsening renal function.  She has been followed closely outpatient with nephrology and found to have deteriorating renal function.  She has undergone work-up in the past including renal biopsy.  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   She was admitted for fluids and close monitoring in case of need for starting on dialysis inpatient.  She underwent repeat HIV labs outpatient with nephrology, however records unable to be viewed in epic.  ID clinic has also not restarted on treatment due to awaiting records from [place].  Labs have again been ordered during this hospitalization to further confirm and guide treatment.  She states that she has been living here for approximately 2 years and has been out of treatment meds for approximately 1 year.  Previously was on Tivicay and another.  Per nephro notes, Cd4 was 5 on recent office check.   HIV labs returned and were consistent with confirming diagnosis as well as significantly low CD4 count (less than 35).  She was evaluated by infectious disease and started on Biktarvy.  Atovaquone was started for PJP prophylaxis.  She was started on IV fluids and a bicarb drip on admission. Renal response/improvement was minimal. Due to her rapid progression of renal decline, nephrology recommended patient undergo VVS consultation with plans for initiation of HD in the next several days.  She underwent placement of left forearm Gore-Tex AV graft on 07/04/2020.

## 2020-07-01 NOTE — Progress Notes (Signed)
PROGRESS NOTE    Brooke Baldwin   XKG:[id number]  DOB: 03-06-1976  DOA: 06/30/2020     1  PCP: Patient, No Pcp Per  CC: worsening kidney function  [facility] Course: Brooke Baldwin is a 44 yo female with PMH CKD (progressive decline, currently stage V), HIV (from [place], no prior records), HTN, TGN who presented with worsening renal function.  She has been followed closely outpatient with nephrology and found to have deteriorating renal function.  She has undergone work-up in the past including renal biopsy.  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   She was admitted for fluids and close monitoring in case of need for starting on dialysis inpatient.  She underwent repeat HIV labs outpatient with nephrology, however records unable to be viewed in epic.  ID clinic has also not restarted on treatment due to awaiting records from [place].  Labs have again been ordered during this hospitalization to further confirm and guide treatment.  She states that she has been living here for approximately 2 years and has been out of treatment meds for approximately 1 year.  Previously was on Tivicay and another.  Per nephro notes, Cd4 was 5 on recent office check.   She was started on IV fluids and a bicarb drip on admission.   Interval History:  No events overnight.  Resting in bed comfortable this morning.  We reviewed her renal history as well as HIV history.  Moved here from [place] approximately 2 years ago and has been out of HIV meds for approximately 1 year. She would want dialysis if absolutely needed.  Denies any significant shortness of breath or chest pain.  Denies cough, fevers, chills, sweats.  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 - creat 2 in May and is 10 on admission - agree with minimal sxms/criteria and some improvement with IVF; so plan is to continue IVF until threshold to dialyze reached -Patient understands and is amenable to dialysis - continue IVF - BMP daily  Metabolic acidosis - due to renal failure - currently on bicarb drip and oral bicarb tabs - follow BMP  HIV (human immunodeficiency virus infection) ([place]) - there has been difficulty confirming and obtaining CD4 count and VL - last CD4 is 5 per nephrology notes from check in office outpatient; unable to view records and ID clinic has not received records from [facility] - checking HIV, VL, and CD4 now - she also states having been on Bactrim and Azithro in the past, likely for ppx purposes based on CD4; await results - will bring ID on board or confirm close outpatient followup prior to discharge; patient has been off HAART for approx 1 year   Thrombocytopenia ([place]) -No prior thrombocytopenia noted on previous labs that are available for review.  Platelets were briefly low on admission, 123 and are improved today despite having received fluids on admission -Continue monitoring platelet count and if further work-up needed will pursue  Neutropenia ([facility]) -Nontoxic-appearing and low suspicion for infection at this time.  Possibly due to immunosuppression from HIV -Will obtain differential in a.m. -Continue monitoring otherwise  Secondary  hyperparathyroidism ([place]) -Continue sevelamer per nephrology -Continue renal diet  Hypoalbuminemia - Level of 1 on admission with goal of 2.5 per nephrology - currently on albumin 25 g BID until goal reached  Elevated blood pressure reading without diagnosis of hypertension - no meds neded at this time  Right  trigeminal neuralgia -No home meds anymore for this? -will clarify again with patient    Antimicrobials: None  DVT prophylaxis: HSQ Code Status: Full Family Communication: None present Disposition Plan: Status is: Inpatient  Remains inpatient appropriate because:Ongoing diagnostic testing needed not appropriate for outpatient work up, Unsafe d/c plan, IV treatments appropriate due to intensity of illness or inability to take PO and Inpatient level of care appropriate due to severity of illness   Dispo: The patient is from: Home              Anticipated d/c is to: Home              Anticipated d/c date is: > 3 days              Patient currently is not medically stable to d/c.       Objective: Blood pressure (!) 139/99, pulse 94, temperature 97.7 F (36.5 C), temperature source Axillary, resp. rate 18, height 5\' 2"  (1.575 m), weight 95.8 kg, last menstrual period 04/02/2015, SpO2 100 %.  Examination: General appearance: alert, cooperative and no distress Head: Normocephalic, without obvious abnormality, atraumatic Eyes: EOMI Lungs: clear to auscultation bilaterally Heart: regular rate and rhythm and S1, S2 normal Abdomen: obese, soft, NT, ND, BS present Extremities: trace to 1+ LE edema in RLE Skin: mobility and turgor normal Neurologic: Grossly normal  Consultants:   Nephrology  Procedures:   none  Data Reviewed: I have personally reviewed following labs and imaging studies Results for orders placed or performed during the Baldwin encounter of 06/30/20 (from the past 24 hour(s))  CBC     Status: Abnormal   Collection Time: 06/30/20  2:59 PM  Result Value Ref Range   WBC 2.0 (L) 4.0 - 10.5 K/uL   RBC 5.63 (H) 3.87 - 5.11 MIL/uL   Hemoglobin 16.0 (H) 12.0 - 15.0 g/dL   HCT 50.0 (H) 36 - 46 %   MCV 88.8 80.0 - 100.0 fL   MCH 28.4 26.0 - 34.0 pg   MCHC 32.0 30.0 - 36.0 g/dL   RDW 16.4 (H) 11.5 - 15.5 %   Platelets 123 (L) 150 - 400 K/uL   nRBC 0.0 0.0 -  0.2 %  Iron and TIBC     Status: Abnormal   Collection Time: 06/30/20  2:59 PM  Result Value Ref Range   Iron 50 28 - 170 ug/dL   TIBC 141 (L) 250 - 450 ug/dL   Saturation Ratios 35 (H) 10.4 - 31.8 %   UIBC 91 ug/dL  Renal function panel     Status: Abnormal   Collection Time: 07/01/20  8:08 AM  Result Value Ref Range   Sodium 140 135 - 145 mmol/L   Potassium 4.1 3.5 - 5.1 mmol/L   Chloride 118 (H) 98 - 111 mmol/L   CO2 10 (L) 22 - 32 mmol/L   Glucose, Bld 85 70 - 99 mg/dL   BUN 63 (H) 6 - 20 mg/dL   Creatinine, Ser 9.24 (H) 0.44 - 1.00 mg/dL   Calcium 7.0 (L) 8.9 - 10.3 mg/dL   Phosphorus 8.0 (H) 2.5 - 4.6 mg/dL   Albumin 1.0 (L) 3.5 - 5.0  g/dL   GFR, Estimated 5 (L) >60 mL/min   Anion gap 12 5 - 15  CBC     Status: Abnormal   Collection Time: 07/01/20  8:08 AM  Result Value Ref Range   WBC 3.0 (L) 4.0 - 10.5 K/uL   RBC 3.98 3.87 - 5.11 MIL/uL   Hemoglobin 11.5 (L) 12.0 - 15.0 g/dL   HCT 34.5 (L) 36 - 46 %   MCV 86.7 80.0 - 100.0 fL   MCH 28.9 26.0 - 34.0 pg   MCHC 33.3 30.0 - 36.0 g/dL   RDW 15.9 (H) 11.5 - 15.5 %   Platelets 158 150 - 400 K/uL   nRBC 0.0 0.0 - 0.2 %    Recent Results (from the past 240 hour(s))  Resp Panel by RT-PCR (Flu A&B, Covid) Nasopharyngeal Swab     Status: None   Collection Time: 06/30/20 12:34 PM   Specimen: Nasopharyngeal Swab; Nasopharyngeal(NP) swabs in vial transport medium  Result Value Ref Range Status   SARS Coronavirus 2 by RT PCR NEGATIVE NEGATIVE Final    Comment: (NOTE) SARS-CoV-2 target nucleic acids are NOT DETECTED.  The SARS-CoV-2 RNA is generally detectable in upper respiratory specimens during the acute phase of infection. The lowest concentration of SARS-CoV-2 viral copies this assay can detect is 138 copies/mL. A negative result does not preclude SARS-Cov-2 infection and should not be used as the sole basis for treatment or other patient management decisions. A negative result may occur with  improper specimen  collection/handling, submission of specimen other than nasopharyngeal swab, presence of viral mutation(s) within the areas targeted by this assay, and inadequate number of viral copies(<138 copies/mL). A negative result must be combined with clinical observations, patient history, and epidemiological information. The expected result is Negative.  Fact Sheet for Patients:  EntrepreneurPulse.com.au  Fact Sheet for Healthcare Providers:  IncredibleEmployment.be  This test is no t yet approved or cleared by the [place] FDA and  has been authorized for detection and/or diagnosis of SARS-CoV-2 by FDA under an Emergency Use Authorization (EUA). This EUA will remain  in effect (meaning this test can be used) for the duration of the COVID-19 declaration under Section 564(b)(1) of the Act, 21 U.S.C.section 360bbb-3(b)(1), unless the authorization is terminated  or revoked sooner.       Influenza A by PCR NEGATIVE NEGATIVE Final   Influenza B by PCR NEGATIVE NEGATIVE Final    Comment: (NOTE) The Xpert Xpress SARS-CoV-2/FLU/RSV plus assay is intended as an aid in the diagnosis of influenza from Nasopharyngeal swab specimens and should not be used as a sole basis for treatment. Nasal washings and aspirates are unacceptable for Xpert Xpress SARS-CoV-2/FLU/RSV testing.  Fact Sheet for Patients: EntrepreneurPulse.com.au  Fact Sheet for Healthcare Providers: IncredibleEmployment.be  This test is not yet approved or cleared by the [place] FDA and has been authorized for detection and/or diagnosis of SARS-CoV-2 by FDA under an Emergency Use Authorization (EUA). This EUA will remain in effect (meaning this test can be used) for the duration of the COVID-19 declaration under Section 564(b)(1) of the Act, 21 U.S.C. section 360bbb-3(b)(1), unless the authorization is terminated or revoked.  Performed at [facility], [place] [address], [place], [place] [address]      Radiology Studies: US RENAL  Result Date: 06/30/2020 CLINICAL DATA:  44 year old female with acute kidney injury EXAM: RENAL / URINARY TRACT ULTRASOUND COMPLETE COMPARISON:  03/14/2020 and CT renal biopsy from 04/13/2020 FINDINGS: Right Kidney: Renal measurements:  7.8 x 5.1 x 4.8 = volume: 100 mL. Poorly visualized with suggestion of diffuse atrophy and increased echogenicity. No mass or hydronephrosis visualized. Left Kidney: Renal measurements: 8.6 x 4.2 x 4.7 = volume: 88 mL. Poorly visualized with suggestion of diffuse atrophy and increased echogenicity. No mass or hydronephrosis visualized. Bladder: Appears normal for degree of bladder distention. Other: None. IMPRESSION: Atrophic kidneys bilaterally with increased echogenicity. No hydronephrosis. Poorly visualized on this study due to body habitus and overlying bowel gas. Electronically Signed   By: Ruthann Cancer MD   On: 06/30/2020 14:45   DG Chest Port 1 View  Result Date: 06/30/2020 CLINICAL DATA:  Evaluate for volume overload. EXAM: PORTABLE CHEST 1 VIEW COMPARISON:  06/28/2020 FINDINGS: The heart size and mediastinal contours are within normal limits. Both lungs are clear. The visualized skeletal structures are unremarkable. IMPRESSION: No active disease. Electronically Signed   By: Kerby Moors M.D.   On: 06/30/2020 13:21   US RENAL  Final Result    DG Chest Port 1 View  Final Result      Scheduled Meds: . heparin  5,000 Units Subcutaneous Q8H  . kidney failure book   Does not apply Once  . multivitamin  1 tablet Oral QHS  . sevelamer carbonate  800 mg Oral TID WC  . sodium bicarbonate  1,300 mg Oral TID   PRN Meds: acetaminophen **OR** acetaminophen, calcium carbonate (dosed in mg elemental calcium), camphor-menthol **AND** hydrOXYzine, docusate sodium, feeding supplement (NEPRO CARB STEADY), guaiFENesin-dextromethorphan, ondansetron **OR** ondansetron  (ZOFRAN) IV, sorbitol, zolpidem Continuous Infusions: . albumin human 25 g (07/01/20 1216)  . sodium bicarbonate (isotonic) 150 mEq in D5W 1000 mL infusion 100 mL/hr at 07/01/20 1213     LOS: 1 day  Time spent: Greater than 50% of the 35 minute visit was spent in counseling/coordination of care for the patient as laid out in the A&P.   Dwyane Dee, MD [facility] 07/01/2020, 1:36 PM

## 2020-07-01 NOTE — Assessment & Plan Note (Addendum)
-  Has been stable without medications but starting to trend up -If remains still elevated tomorrow or requires PRN meds, may try to initiate low-dose med -For now, use PRN labetalol or hydralazine

## 2020-07-01 NOTE — Assessment & Plan Note (Addendum)
-   there has been difficulty confirming and obtaining CD4 count and VL outpatient - last CD4 is 5 per nephrology notes from check in office outpatient; unable to view records and ID clinic has not received records from [facility] - checking HIV (reactive), VL (in process), and CD4 (<35) - ID consulted, appreciate assistance - patient started on Biktarvy while inpatient - starting atovaquone for PJP ppx (started on 11/27) - further treatment per ID

## 2020-07-01 NOTE — Assessment & Plan Note (Signed)
-  No home meds anymore for this? -will clarify again with patient

## 2020-07-01 NOTE — Assessment & Plan Note (Addendum)
-   due to renal failure - s/p bicarb drip initially on admission; now stopped and CO2 normal - continue oral bicarb as per nephrology  - follow BMP

## 2020-07-01 NOTE — Assessment & Plan Note (Addendum)
-  Nontoxic-appearing and low suspicion for infection at this time.  Possibly due to immunosuppression from HIV and/or GI illness -Continue monitoring otherwise

## 2020-07-01 NOTE — Plan of Care (Signed)
  Problem: Activity: Goal: Risk for activity intolerance will decrease Outcome: Completed/Met   Problem: Nutrition: Goal: Adequate nutrition will be maintained Outcome: Completed/Met   Problem: Coping: Goal: Level of anxiety will decrease Outcome: Completed/Met   Problem: Pain Managment: Goal: General experience of comfort will improve Outcome: Completed/Met   Problem: Safety: Goal: Ability to remain free from injury will improve Outcome: Completed/Met   Problem: Skin Integrity: Goal: Risk for impaired skin integrity will decrease Outcome: Completed/Met

## 2020-07-01 NOTE — Assessment & Plan Note (Addendum)
-   Level of 1 on admission with goal of 2.5 per nephrology - currently on albumin 25 g BID until goal reached

## 2020-07-01 NOTE — Assessment & Plan Note (Addendum)
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

## 2020-07-01 NOTE — Progress Notes (Signed)
   07/01/20 1100  Clinical Encounter Type  Visited With Patient  Visit Type Spiritual support  Referral From Patient  Consult/Referral To Chaplain  Spiritual Encounters  Spiritual Needs Prayer;Emotional  The chaplain visited with the patient. The chaplain spoke to the patient about her changing health condition. The patient's mother is driving to [place] from [place]. The patient is aware of her condition and needs a different diet etc. The patient asked the chaplain to pray. The chaplain prayed and will follow up as needed.

## 2020-07-01 NOTE — Assessment & Plan Note (Signed)
-  Continue sevelamer per nephrology -Continue renal diet

## 2020-07-01 NOTE — Progress Notes (Signed)
Nephrology Follow-Up Consult note   Assessment/Recommendations: Brooke Baldwin is a/an 44 y.o. female with a past medical history significant for HIV, CKD, FSGS, admitted for AKI.     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  Didn't tolerate prednisone in the past. Baseline creatinine around 2.  Now with creatinine to 10.  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 or some component of dehydration -UA w/ proteinuria and UA w/o obstruction -Crt mildly improved with hydration -Hemoconcentration on presentation suggests some level of dehydration -Given minimal symptoms and mild improvement in Crt will continue hydration and CTM crt trend -Acidosis as below -If she fails to improve may need to start HD while here; discussed with patient -restrict K in diet -Likely nephrotic syndrome based on serum albumin; hold off on anticoagulation -Could consider re-biopsy and possible immunosuppression based on result but unlikely to benefit from aggressive treatment -Continue to monitor daily Cr, Dose meds for GFR<15 -Monitor Daily I/Os, Daily weight  -Maintain MAP>65 for optimal renal perfusion.  -Hold ARB, avoid NSAIDs, and contrast if abl  Metabolic Acidosis: Non-anion gap most likely related to AKI and CKD. Switch LR to Nabicarb 170meq 100cc/hr and continue Nabicarb 1300mg  TID.  Hypertension: -BP acceptable at this time CTM.  HIV -Recommend ID consult. Abs CD4 checked at the office and was 5. Appreciate help  Hypoalbuminemia -Albumin 1 on labs likely 2/2 nephrotic syndrome -IV albumin given concern for decreased renal perfusion -25g BID w/ goal of serum alb of 2.5   Secondary Hyperparathyroidism -Phos 8. Start sevelamer 800mg  TID. Restrict phos in diet.  Leukopenia -possibly secondary to  HIV/bone marrow suppression? Mgmt per primary service  Thrombocytopenia -monitor, no sequelae of bleeding. 2/2 HIV/marrow suppression?   Recommendations conveyed to primary service.    [facility] 07/01/2020 9:56 AM  ___________________________________________________________  CC: AKI  Interval History/Subjective: Patient states she feels okay today.  She urinated without significant issues.  She mainly has just felt tired.  She denies nausea and vomiting.   Medications:  Current Facility-Administered Medications  Medication Dose Route Frequency Provider Last Rate Last Admin  . acetaminophen (TYLENOL) tablet 650 mg  650 mg Oral Q6H PRN Para Skeans, MD       Or  . acetaminophen (TYLENOL) suppository 650 mg  650 mg Rectal Q6H PRN Para Skeans, MD      . calcium carbonate (dosed in mg elemental calcium) suspension 500 mg of elemental calcium  500 mg of elemental calcium Oral Q6H PRN Para Skeans, MD      . camphor-menthol [facility]) lotion 1 application  1 application Topical M0Q PRN Para Skeans, MD       And  . hydrOXYzine (ATARAX/VISTARIL) tablet 25 mg  25 mg Oral Q8H PRN Para Skeans, MD      . docusate sodium (ENEMEEZ) enema 283 mg  1 enema Rectal PRN Para Skeans, MD      . feeding supplement (NEPRO CARB STEADY) liquid 237 mL  237 mL Oral TID PRN Para Skeans, MD      . guaiFENesin-dextromethorphan (ROBITUSSIN DM) 100-10 MG/5ML syrup 10 mL  10 mL Oral Q6H PRN Chotiner, [name], MD   10 mL at 07/01/20 0400  . heparin injection 5,000 Units  5,000 Units Subcutaneous Q8H Patel,  Gretta Cool, MD   5,000 Units at 07/01/20 3704  . kidney failure book   Does not apply Once Para Skeans, MD      . lactated ringers infusion   Intravenous Continuous Gean Quint, MD 100 mL/hr at 07/01/20 0342 New Bag at 07/01/20 0342  . multivitamin (RENA-VIT) tablet 1 tablet  1 tablet Oral QHS Para Skeans, MD   1 tablet at 06/30/20 2241  . ondansetron (ZOFRAN)  tablet 4 mg  4 mg Oral Q6H PRN Para Skeans, MD       Or  . ondansetron [facility]) injection 4 mg  4 mg Intravenous Q6H PRN Florina Ou V, MD      . sodium bicarbonate tablet 1,300 mg  1,300 mg Oral TID Gean Quint, MD   1,300 mg at 07/01/20 0906  . sorbitol 70 % solution 30 mL  30 mL Oral PRN Para Skeans, MD      . zolpidem (AMBIEN) tablet 5 mg  5 mg Oral QHS PRN Para Skeans, MD          Review of Systems: 10 systems reviewed and negative except per interval history/subjective  Physical Exam: Vitals:   07/01/20 0508 07/01/20 0749  BP: (!) 153/94 (!) 134/100  Pulse: 97 97  Resp: 20 18  Temp: 98.9 F (37.2 C) 98.8 F (37.1 C)  SpO2: 99% 100%   No intake/output data recorded.  Intake/Output Summary (Last 24 hours) at 07/01/2020 0956 Last data filed at 07/01/2020 0600 Gross per 24 hour  Intake 2627.4 ml  Output 900 ml  Net 1727.4 ml   Constitutional: well-appearing, no acute distress ENMT: ears and nose without scars or lesions, MMM CV: normal rate, murmur present no edema Respiratory: clear to auscultation, normal work of breathing Gastrointestinal: soft, non-tender, no palpable masses or hernias Skin: no visible lesions or rashes Psych: alert, judgement/insight appropriate, appropriate mood and affect   Test Results I personally reviewed new and old clinical labs and radiology tests Lab Results  Component Value Date   NA 140 07/01/2020   K 4.1 07/01/2020   CL 118 (H) 07/01/2020   CO2 10 (L) 07/01/2020   BUN 63 (H) 07/01/2020   CREATININE 9.24 (H) 07/01/2020   CALCIUM 7.0 (L) 07/01/2020   ALBUMIN 1.0 (L) 07/01/2020   PHOS 8.0 (H) 07/01/2020

## 2020-07-02 DIAGNOSIS — R197 Diarrhea, unspecified: Secondary | ICD-10-CM | POA: Diagnosis not present

## 2020-07-02 DIAGNOSIS — B20 Human immunodeficiency virus [HIV] disease: Secondary | ICD-10-CM | POA: Diagnosis not present

## 2020-07-02 DIAGNOSIS — N179 Acute kidney failure, unspecified: Secondary | ICD-10-CM | POA: Diagnosis not present

## 2020-07-02 DIAGNOSIS — N185 Chronic kidney disease, stage 5: Secondary | ICD-10-CM | POA: Diagnosis not present

## 2020-07-02 DIAGNOSIS — A040 Enteropathogenic Escherichia coli infection: Secondary | ICD-10-CM | POA: Insufficient documentation

## 2020-07-02 LAB — CBC WITH DIFFERENTIAL/PLATELET
Abs Immature Granulocytes: 0.01 10*3/uL (ref 0.00–0.07)
Basophils Absolute: 0 10*3/uL (ref 0.0–0.1)
Basophils Relative: 0 %
Eosinophils Absolute: 0.2 10*3/uL (ref 0.0–0.5)
Eosinophils Relative: 8 %
HCT: 28.3 % — ABNORMAL LOW (ref 36.0–46.0)
Hemoglobin: 9.7 g/dL — ABNORMAL LOW (ref 12.0–15.0)
Immature Granulocytes: 0 %
Lymphocytes Relative: 24 %
Lymphs Abs: 0.6 10*3/uL — ABNORMAL LOW (ref 0.7–4.0)
MCH: 28.8 pg (ref 26.0–34.0)
MCHC: 34.3 g/dL (ref 30.0–36.0)
MCV: 84 fL (ref 80.0–100.0)
Monocytes Absolute: 0.3 10*3/uL (ref 0.1–1.0)
Monocytes Relative: 13 %
Neutro Abs: 1.3 10*3/uL — ABNORMAL LOW (ref 1.7–7.7)
Neutrophils Relative %: 55 %
Platelets: 146 10*3/uL — ABNORMAL LOW (ref 150–400)
RBC: 3.37 MIL/uL — ABNORMAL LOW (ref 3.87–5.11)
RDW: 15.3 % (ref 11.5–15.5)
WBC: 2.3 10*3/uL — ABNORMAL LOW (ref 4.0–10.5)
nRBC: 0 % (ref 0.0–0.2)

## 2020-07-02 LAB — RENAL FUNCTION PANEL
Albumin: 1.7 g/dL — ABNORMAL LOW (ref 3.5–5.0)
Anion gap: 13 (ref 5–15)
BUN: 68 mg/dL — ABNORMAL HIGH (ref 6–20)
CO2: 16 mmol/L — ABNORMAL LOW (ref 22–32)
Calcium: 6.9 mg/dL — ABNORMAL LOW (ref 8.9–10.3)
Chloride: 113 mmol/L — ABNORMAL HIGH (ref 98–111)
Creatinine, Ser: 9.42 mg/dL — ABNORMAL HIGH (ref 0.44–1.00)
GFR, Estimated: 5 mL/min — ABNORMAL LOW (ref >60)
Glucose, Bld: 88 mg/dL (ref 70–99)
Phosphorus: 7.2 mg/dL — ABNORMAL HIGH (ref 2.5–4.6)
Potassium: 3.3 mmol/L — ABNORMAL LOW (ref 3.5–5.1)
Sodium: 142 mmol/L (ref 135–145)

## 2020-07-02 LAB — MAGNESIUM: Magnesium: 1.4 mg/dL — ABNORMAL LOW (ref 1.7–2.4)

## 2020-07-02 LAB — PROTEIN / CREATININE RATIO, URINE
Creatinine, Urine: 36.88 mg/dL
Protein Creatinine Ratio: 26.08 mg/mg{Cre} — ABNORMAL HIGH (ref 0.00–0.15)
Total Protein, Urine: 962 mg/dL

## 2020-07-02 MED ORDER — POTASSIUM CHLORIDE CRYS ER 20 MEQ PO TBCR
20.0000 meq | EXTENDED_RELEASE_TABLET | Freq: Once | ORAL | Status: AC
  Administered 2020-07-02: 20 meq via ORAL
  Filled 2020-07-02: qty 1

## 2020-07-02 MED ORDER — MAGNESIUM OXIDE 400 (241.3 MG) MG PO TABS
800.0000 mg | ORAL_TABLET | Freq: Once | ORAL | Status: AC
  Administered 2020-07-02: 800 mg via ORAL
  Filled 2020-07-02: qty 2

## 2020-07-02 MED ORDER — ATOVAQUONE 750 MG/5ML PO SUSP
1500.0000 mg | Freq: Every day | ORAL | Status: DC
  Administered 2020-07-02 – 2020-07-11 (×9): 1500 mg via ORAL
  Filled 2020-07-02 (×13): qty 10

## 2020-07-02 MED ORDER — LOPERAMIDE HCL 2 MG PO CAPS
2.0000 mg | ORAL_CAPSULE | Freq: Four times a day (QID) | ORAL | Status: DC | PRN
  Administered 2020-07-03: 2 mg via ORAL
  Filled 2020-07-02 (×2): qty 1

## 2020-07-02 NOTE — Progress Notes (Signed)
Nephrology Follow-Up Consult note   Assessment/Recommendations: Brooke Baldwin is a/an 44 y.o. female with a past medical history significant for HIV, CKD, FSGS, admitted for AKI.     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  Didn't tolerate prednisone in the past. Baseline creatinine around 2 but now w/ significant rise minimally improved with fluids. 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 -UA w/ proteinuria and UA w/o obstruction, atrophic kidneys -Crt stable, no improvement -Given her trajectory will plan for initiation of dialysis early next week -VVS consult for access placement; if able to do early next week also can place [facility], if procedure delayed can have IR help with [facility]DC -Minimal uremic symptoms; no urgent HD need -Likely nephrotic syndrome based on serum albumin; hold off on anticoagulation -Consider re-biopsy and possible immunosuppression but likely not efficacious given GFR -Continue to monitor daily Cr, Dose meds for GFR -Monitor Daily I/Os, Daily weight  -Maintain MAP>65 for optimal renal perfusion.  -Hold ARB, avoid NSAIDs, and contrast if able  Metabolic Acidosis: Non-anion gap most likely related to AKI, CKD, diarrhea. Improving. Nabicarb 157meq 100cc/hr and continue Nabicarb 1300mg  TID.  Hypertension: -BP acceptable at this time CTM.  HIV -Recommend ID consult. Poorly controlled  Hypoalbuminemia -Albumin 1 on labs likely 2/2 nephrotic syndrome. Improving. -IV albumin given concern for decreased renal perfusion -25g BID w/ goal of serum alb of 2.5   Secondary Hyperparathyroidism -Phos improved. Continue sevelamer 800mg  TID. Restrict phos in diet.  Leukopenia -possibly secondary to HIV/bone marrow suppression? Mgmt per primary service   Recommendations conveyed to primary service.     [facility] 07/02/2020 9:41 AM  ___________________________________________________________  CC: AKI  Interval History/Subjective: Patient feeling okay today. Having diarrhea. Denies SOB or chest pain. Urinating okay. We discussed dialysis yesterday and she understands she will likely need to start.   Medications:  Current Facility-Administered Medications  Medication Dose Route Frequency Provider Last Rate Last Admin  . acetaminophen (TYLENOL) tablet 650 mg  650 mg Oral Q6H PRN Para Skeans, MD       Or  . acetaminophen (TYLENOL) suppository 650 mg  650 mg Rectal Q6H PRN Para Skeans, MD      . albumin human 25 % solution 25 g  25 g Intravenous Q12H Reesa Chew, MD 60 mL/hr at 07/02/20 0823 25 g at 07/02/20 0823  . atovaquone (MEPRON) 750 MG/5ML suspension 1,500 mg  1,500 mg Oral Q breakfast Dwyane Dee, MD      . calcium carbonate (dosed in mg elemental calcium) suspension 500 mg of elemental calcium  500 mg of elemental calcium Oral Q6H PRN Para Skeans, MD      . camphor-menthol [facility]) lotion 1 application  1 application Topical I5O PRN Para Skeans, MD       And  . hydrOXYzine (ATARAX/VISTARIL) tablet 25 mg  25 mg Oral Q8H PRN Para Skeans, MD      . docusate sodium (ENEMEEZ) enema 283 mg  1 enema Rectal PRN Para Skeans, MD      . feeding supplement (NEPRO CARB STEADY) liquid 237 mL  237 mL Oral TID PRN Para Skeans, MD      . guaiFENesin-dextromethorphan (ROBITUSSIN DM) 100-10 MG/5ML syrup 10 mL  10 mL Oral Q6H PRN Chotiner, [name], MD   10  mL at 07/01/20 2117  . heparin injection 5,000 Units  5,000 Units Subcutaneous Q8H Para Skeans, MD   5,000 Units at 07/02/20 0630  . kidney failure book   Does not apply Once Para Skeans, MD      . multivitamin (RENA-VIT) tablet 1 tablet  1 tablet Oral QHS Para Skeans, MD   1 tablet at 07/01/20 2113  . ondansetron (ZOFRAN) tablet 4 mg  4 mg Oral Q6H PRN Para Skeans, MD       Or   . ondansetron (ZOFRAN) injection 4 mg  4 mg Intravenous Q6H PRN Para Skeans, MD      . sevelamer carbonate (RENVELA) tablet 800 mg  800 mg Oral TID WC Reesa Chew, MD   800 mg at 07/02/20 0815  . sodium bicarbonate 150 mEq in dextrose 5 % 1,000 mL infusion   Intravenous Continuous Reesa Chew, MD 100 mL/hr at 07/01/20 2302 New Bag at 07/01/20 2302  . sodium bicarbonate tablet 1,300 mg  1,300 mg Oral TID Gean Quint, MD   1,300 mg at 07/02/20 0815  . sorbitol 70 % solution 30 mL  30 mL Oral PRN Para Skeans, MD      . zolpidem (AMBIEN) tablet 5 mg  5 mg Oral QHS PRN Para Skeans, MD          Review of Systems: 10 systems reviewed and negative except per interval history/subjective  Physical Exam: Vitals:   07/01/20 2356 07/02/20 0649  BP: 134/85 130/85  Pulse: 87 83  Resp: 16 18  Temp: 98.2 F (36.8 C) 98.7 F (37.1 C)  SpO2: 100% 100%   No intake/output data recorded.  Intake/Output Summary (Last 24 hours) at 07/02/2020 0941 Last data filed at 07/02/2020 1601 Gross per 24 hour  Intake 2213.5 ml  Output 1000 ml  Net 1213.5 ml   Constitutional: well-appearing, no acute distress ENMT: ears and nose without scars or lesions, MMM CV: normal rate, murmur present, 1+ pitting edema in ble Respiratory: clear to auscultation, normal work of breathing Gastrointestinal: soft, non-tender, no palpable masses or hernias Skin: no visible lesions or rashes Psych: alert, judgement/insight appropriate, appropriate mood and affect   Test Results I personally reviewed new and old clinical labs and radiology tests Lab Results  Component Value Date   NA 142 07/02/2020   K 3.3 (L) 07/02/2020   CL 113 (H) 07/02/2020   CO2 16 (L) 07/02/2020   BUN 68 (H) 07/02/2020   CREATININE 9.42 (H) 07/02/2020   CALCIUM 6.9 (L) 07/02/2020   ALBUMIN 1.7 (L) 07/02/2020   PHOS 7.2 (H) 07/02/2020

## 2020-07-02 NOTE — Consult Note (Signed)
ASSESSMENT & PLAN:  44 y.o. 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   I counseled the patient about details of hemodialysis access.  I counseled her about the risk of steal syndrome.  I have ordered vein mapping.  We will plan to do left upper forearm loop extremity AV graft with immediate access graft given her exaggerated risk of catheter infection with AIDS.   CHIEF COMPLAINT:   Worsening kidney disease  HISTORY:  HISTORY OF PRESENT ILLNESS: Brooke Baldwin is a 44 y.o. 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  She is right-handed.  She denies any history of trauma or surgery to the upper extremities.  Past Medical History:  Diagnosis Date  . HIV (human immunodeficiency virus infection) ([employer])   . Hypertension   . Trigeminal neuralgia     Past Surgical History:  Procedure Laterality Date  . ANKLE FRACTURE SURGERY Right   . CERVIX SURGERY     removal of HPV warts    Family History  Problem Relation Age of Onset  . Diabetes Mother   . Healthy Father     Social History   Socioeconomic History  . Marital status: Single    Spouse name: Not on file  . Number of children: 0  . Years of education: college  . Highest education level: Bachelor's degree (e.g., BA, AB, BS)  Occupational History  . Occupation: Administrator, arts  Tobacco Use  . Smoking status: Never Smoker  . Smokeless tobacco: Never Used  Vaping Use  . Vaping Use: Never used  Substance and Sexual Activity  . Alcohol use: Yes    Comment: occasional  . Drug use: Not Currently  . Sexual activity: Not Currently    Birth control/protection: None  Other Topics Concern  . Not on file  Social History Narrative   Lives at home with significant other.   Right-handed.   No daily caffeine use.   Social Determinants of Health   Financial Resource  Strain:   . Difficulty of Paying Living Expenses: Not on file  Food Insecurity:   . Worried About Charity fundraiser in the Last Year: Not on file  . Ran Out of Food in the Last Year: Not on file  Transportation Needs:   . Lack of Transportation (Medical): Not on file  . Lack of Transportation (Non-Medical): Not on file  Physical Activity:   . Days of Exercise per Week: Not on file  . Minutes of Exercise per Session: Not on file  Stress:   . Feeling of Stress : Not on file  Social Connections:   . Frequency of Communication with Friends and Family: Not on file  . Frequency of Social Gatherings with Friends and Family: Not on file  . Attends Religious Services: Not on file  . Active Member of Clubs or Organizations: Not on file  . Attends Archivist Meetings: Not on file  . Marital Status: Not on file  Intimate Partner Violence:   . Fear of Current or Ex-Partner: Not on file  . Emotionally Abused: Not on file  . Physically Abused: Not on file  . Sexually Abused: Not on file    Allergies  Allergen Reactions  . Baclofen Other (See Comments)    Caused intolerable dizziness   . Gabapentin Other (See Comments)    Caused intolerable dizziness  .  Penicillins Other (See Comments)    Unknown childhood reaction     Current Facility-Administered Medications  Medication Dose Route Frequency Provider Last Rate Last Admin  . acetaminophen (TYLENOL) tablet 650 mg  650 mg Oral Q6H PRN Para Skeans, MD       Or  . acetaminophen (TYLENOL) suppository 650 mg  650 mg Rectal Q6H PRN Para Skeans, MD      . albumin human 25 % solution 25 g  25 g Intravenous Q12H Reesa Chew, MD 60 mL/hr at 07/02/20 0823 25 g at 07/02/20 0823  . atovaquone (MEPRON) 750 MG/5ML suspension 1,500 mg  1,500 mg Oral Q breakfast Dwyane Dee, MD      . calcium carbonate (dosed in mg elemental calcium) suspension 500 mg of elemental calcium  500 mg of elemental calcium Oral Q6H PRN Para Skeans, MD       . camphor-menthol [facility]) lotion 1 application  1 application Topical T0V PRN Para Skeans, MD       And  . hydrOXYzine (ATARAX/VISTARIL) tablet 25 mg  25 mg Oral Q8H PRN Para Skeans, MD      . docusate sodium (ENEMEEZ) enema 283 mg  1 enema Rectal PRN Para Skeans, MD      . feeding supplement (NEPRO CARB STEADY) liquid 237 mL  237 mL Oral TID PRN Para Skeans, MD      . guaiFENesin-dextromethorphan (ROBITUSSIN DM) 100-10 MG/5ML syrup 10 mL  10 mL Oral Q6H PRN Chotiner, [name], MD   10 mL at 07/02/20 1016  . heparin injection 5,000 Units  5,000 Units Subcutaneous Q8H Para Skeans, MD   5,000 Units at 07/02/20 0608  . kidney failure book   Does not apply Once Para Skeans, MD      . multivitamin (RENA-VIT) tablet 1 tablet  1 tablet Oral QHS Para Skeans, MD   1 tablet at 07/01/20 2113  . ondansetron (ZOFRAN) tablet 4 mg  4 mg Oral Q6H PRN Para Skeans, MD       Or  . ondansetron (ZOFRAN) injection 4 mg  4 mg Intravenous Q6H PRN Para Skeans, MD      . sevelamer carbonate (RENVELA) tablet 800 mg  800 mg Oral TID WC Reesa Chew, MD   800 mg at 07/02/20 0815  . sodium bicarbonate 150 mEq in dextrose 5 % 1,000 mL infusion   Intravenous Continuous Reesa Chew, MD 100 mL/hr at 07/02/20 1012 New Bag at 07/02/20 1012  . sodium bicarbonate tablet 1,300 mg  1,300 mg Oral TID Gean Quint, MD   1,300 mg at 07/02/20 0815  . sorbitol 70 % solution 30 mL  30 mL Oral PRN Para Skeans, MD      . zolpidem (AMBIEN) tablet 5 mg  5 mg Oral QHS PRN Para Skeans, MD        REVIEW OF SYSTEMS:  [X]  denotes positive finding, [ ]  denotes negative finding Cardiac  Comments:  Chest pain or chest pressure:    Shortness of breath upon exertion:    Short of breath when lying flat:    Irregular heart rhythm:        Vascular    Pain in calf, thigh, or hip brought on by ambulation:    Pain in feet at night that wakes you up from your sleep:     Blood clot in your veins:    Leg swelling:  Pulmonary    Oxygen at home:    Productive cough:     Wheezing:         Neurologic    Sudden weakness in arms or legs:     Sudden numbness in arms or legs:     Sudden onset of difficulty speaking or slurred speech:    Temporary loss of vision in one eye:     Problems with dizziness:         Gastrointestinal    Blood in stool:     Vomited blood:         Genitourinary    Burning when urinating:     Blood in urine:        Psychiatric    Major depression:         Hematologic    Bleeding problems:    Problems with blood clotting too easily:        Skin    Rashes or ulcers:        Constitutional    Fever or chills:     PHYSICAL EXAM:   Vitals:   07/01/20 2356 07/02/20 0452 07/02/20 0649 07/02/20 0959  BP: 134/85  130/85 133/86  Pulse: 87  83 87  Resp: 16  18 16   Temp: 98.2 F (36.8 C)  98.7 F (37.1 C) 98.3 F (36.8 C)  TempSrc: Oral  Oral Oral  SpO2: 100%  100% 97%  Weight:  96.8 kg    Height:        Constitutional: Well appearing in no distress. Appears well nourished.  Neurologic: Normal gait and station. CN intact. No weakness. No sensory loss. Psychiatric: Mood and affect symmetric and appropriate. Eyes: No icterus. No conjunctival pallor. Ears, nose, throat: mucous membranes moist. Midline trachea. No carotid bruit. Cardiac: regular rate and rhythm.  Respiratory: unlabored. Abdominal: soft, non-tender, non-distended. No palpable pulsatile abdominal mass. Peripheral vascular:  Brachial pulse: L 2+ / R 2+  Radial pulse: L 2+ / R 2+ Extremity: No edema. No cyanosis. No pallor.  Skin: No gangrene. No ulceration.  Lymphatic: No Stemmer's sign. No palpable lymphadenopathy.   DATA REVIEW:    Most recent CBC CBC Latest Ref Rng & Units 07/02/2020 07/01/2020 06/30/2020  WBC 4.0 - 10.5 K/uL 2.3(L) 3.0(L) 2.0(L)  Hemoglobin 12.0 - 15.0 g/dL 9.7(L) 11.5(L) 16.0(H)  Hematocrit 36 - 46 % 28.3(L) 34.5(L) 50.0(H)  Platelets 150 - 400 K/uL 146(L) 158  123(L)     Most recent CMP CMP Latest Ref Rng & Units 07/02/2020 07/01/2020 07/01/2020  Glucose 70 - 99 mg/dL 88 87 85  BUN 6 - 20 mg/dL 68(H) 68(H) 63(H)  Creatinine 0.44 - 1.00 mg/dL 9.42(H) 9.44(H) 9.24(H)  Sodium 135 - 145 mmol/L 142 141 140  Potassium 3.5 - 5.1 mmol/L 3.3(L) 4.2 4.1  Chloride 98 - 111 mmol/L 113(H) 118(H) 118(H)  CO2 22 - 32 mmol/L 16(L) 13(L) 10(L)  Calcium 8.9 - 10.3 mg/dL 6.9(L) 6.9(L) 7.0(L)  Total Protein 6.0 - 8.5 g/dL - - -  Total Bilirubin 0.0 - 1.2 mg/dL - - -  Alkaline Phos 39 - 117 IU/L - - -  AST 0 - 40 IU/L - - -  ALT 0 - 32 IU/L - - -    Renal function Estimated Creatinine Clearance: 8.2 mL/min (A) (by C-G formula based on SCr of 9.42 mg/dL (H)).  No results found for: HGBA1C  No results found for: LDLCALC, LDLC, HIRISKLDL, POCLDL, LDLDIRECT, REALLDLC, TOTLDLC   Vascular Imaging: None yet  [name]. Stanford Breed, MD [facility] Phone Number: [phone] 07/02/2020 10:32 AM

## 2020-07-02 NOTE — Assessment & Plan Note (Addendum)
-  Patient started developing diarrhea on admission, was not having any prior to admission -GI pathogen panel notable for EPEC.  Typically self resolving however in setting of her immunocompromised state, will initiate on 3-day course of azithromycin - PRN imodium

## 2020-07-02 NOTE — Progress Notes (Signed)
PROGRESS NOTE    Prisma Decarlo   TKW:[id number]  DOB: [birth year]  DOA: 06/30/2020     2  PCP: Patient, No Pcp Per  CC: worsening kidney function  Hospital Course: Ms. Killion is a 44 yo female with PMH CKD (progressive decline, currently stage V), HIV (from [place], no prior records), HTN, TGN who presented with worsening renal function.  She has been followed closely outpatient with nephrology and found to have deteriorating renal function.  She has undergone work-up in the past including renal biopsy.  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   She was admitted for fluids and close monitoring in case of need for starting on dialysis inpatient.  She underwent repeat HIV labs outpatient with nephrology, however records unable to be viewed in epic.  ID clinic has also not restarted on treatment due to awaiting records from [place].  Labs have again been ordered during this hospitalization to further confirm and guide treatment.  She states that she has been living here for approximately 2 years and has been out of treatment meds for approximately 1 year.  Previously was on Tivicay and another.  Per nephro notes, Cd4 was 5 on recent office check.   She was started on IV fluids and a bicarb drip on admission. Renal response/improvement was minimal. Due to her rapid progression of renal decline, nephrology recommended patient undergo VVS consultation with plans for initiation of HD in the next several days.    Interval History:  No events overnight.  She did however start to develop some diarrhea overnight and has had several movements today.  No known contacts with similar symptoms prior to admission. She was also evaluated by vascular surgery with plans for graft placement on Tuesday followed by possible dialysis soon after.  She is amenable and understands plan.  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 - creat 2 in May and is 10 on admission - minimal response to IVF - VVS consulted; plan is for graft placement on Tuesday with ability to use for immediate HD if/when needed -Patient understands and is amenable to dialysis - continue IVF (bicarb drip) unless starts becoming overloaded - BMP daily  Metabolic acidosis - due to renal failure - currently on bicarb drip and oral bicarb tabs - follow BMP  HIV (human immunodeficiency virus infection) ([place]) - there has been difficulty confirming and obtaining CD4 count and VL - last CD4 is 5 per nephrology notes from check in office outpatient; unable to view records and ID clinic has not received records from [facility] - checking HIV (reactive), VL (in process), and CD4 (<35) - ID consulted - starting atovaquone for PJP ppx - further treatment per ID   Diarrhea -Patient started developing diarrhea on admission, was not having any prior to admission -Send for GI pathogen panel -Okay for some Imodium for now  Thrombocytopenia ([place]) -No prior thrombocytopenia noted on previous labs that are available for review.  Platelets were briefly low on admission, 123 and are improved today despite having received fluids on admission -Continue monitoring platelet count and if further work-up  needed will pursue  Neutropenia ([place]) -Nontoxic-appearing and low suspicion for infection at this time.  Possibly due to immunosuppression from HIV - no concern for infection at this time  -Continue monitoring otherwise  Secondary hyperparathyroidism ([place]) -Continue  sevelamer per nephrology -Continue renal diet  Hypoalbuminemia - Level of 1 on admission with goal of 2.5 per nephrology - currently on albumin 25 g BID until goal reached  Elevated blood pressure reading without diagnosis of hypertension - no meds neded at this time  Right trigeminal neuralgia -No home meds anymore for this? -will clarify again with patient   Antimicrobials: None  DVT prophylaxis: HSQ Code Status: Full Family Communication: None present Disposition Plan: Status is: Inpatient  Remains inpatient appropriate because:Ongoing diagnostic testing needed not appropriate for outpatient work up, Unsafe d/c plan, IV treatments appropriate due to intensity of illness or inability to take PO and Inpatient level of care appropriate due to severity of illness   Dispo: The patient is from: Home              Anticipated d/c is to: Home              Anticipated d/c date is: > 3 days              Patient currently is not medically stable to d/c.  Objective: Blood pressure 133/86, pulse 87, temperature 98.3 F (36.8 C), temperature source Oral, resp. rate 16, height 5\' 2"  (1.575 m), weight 96.8 kg, last menstrual period 04/02/2015, SpO2 97 %.  Examination: General appearance: alert, cooperative and no distress Head: Normocephalic, without obvious abnormality, atraumatic Eyes: EOMI Lungs: clear to auscultation bilaterally Heart: regular rate and rhythm and S1, S2 normal Abdomen: obese, soft, NT, ND, BS present Extremities: trace to 1+ LE edema in RLE Skin: mobility and turgor normal Neurologic: Grossly normal  Consultants:   Nephrology  Vascular surgery  Infectious disease  Procedures:   none  Data Reviewed: I have personally reviewed following labs and imaging studies Results for orders placed or performed during the hospital encounter of 06/30/20 (from the past 24 hour(s))  Basic metabolic panel     Status: Abnormal   Collection Time: 07/01/20  3:11 PM   Result Value Ref Range   Sodium 141 135 - 145 mmol/L   Potassium 4.2 3.5 - 5.1 mmol/L   Chloride 118 (H) 98 - 111 mmol/L   CO2 13 (L) 22 - 32 mmol/L   Glucose, Bld 87 70 - 99 mg/dL   BUN 68 (H) 6 - 20 mg/dL   Creatinine, Ser 9.44 (H) 0.44 - 1.00 mg/dL   Calcium 6.9 (L) 8.9 - 10.3 mg/dL   GFR, Estimated 5 (L) >60 mL/min   Anion gap 10 5 - 15  Renal function panel     Status: Abnormal   Collection Time: 07/02/20  3:06 AM  Result Value Ref Range   Sodium 142 135 - 145 mmol/L   Potassium 3.3 (L) 3.5 - 5.1 mmol/L   Chloride 113 (H) 98 - 111 mmol/L   CO2 16 (L) 22 - 32 mmol/L   Glucose, Bld 88 70 - 99 mg/dL   BUN 68 (H) 6 - 20 mg/dL   Creatinine, Ser 9.42 (H) 0.44 - 1.00 mg/dL   Calcium 6.9 (L) 8.9 - 10.3 mg/dL   Phosphorus 7.2 (H) 2.5 - 4.6 mg/dL   Albumin 1.7 (L) 3.5 - 5.0 g/dL   GFR, Estimated 5 (L) >60 mL/min   Anion gap 13 5 -  15  CBC with Differential/Platelet     Status: Abnormal   Collection Time: 07/02/20  3:06 AM  Result Value Ref Range   WBC 2.3 (L) 4.0 - 10.5 K/uL   RBC 3.37 (L) 3.87 - 5.11 MIL/uL   Hemoglobin 9.7 (L) 12.0 - 15.0 g/dL   HCT 28.3 (L) 36 - 46 %   MCV 84.0 80.0 - 100.0 fL   MCH 28.8 26.0 - 34.0 pg   MCHC 34.3 30.0 - 36.0 g/dL   RDW 15.3 11.5 - 15.5 %   Platelets 146 (L) 150 - 400 K/uL   nRBC 0.0 0.0 - 0.2 %   Neutrophils Relative % 55 %   Neutro Abs 1.3 (L) 1.7 - 7.7 K/uL   Lymphocytes Relative 24 %   Lymphs Abs 0.6 (L) 0.7 - 4.0 K/uL   Monocytes Relative 13 %   Monocytes Absolute 0.3 0.1 - 1.0 K/uL   Eosinophils Relative 8 %   Eosinophils Absolute 0.2 0.0 - 0.5 K/uL   Basophils Relative 0 %   Basophils Absolute 0.0 0.0 - 0.1 K/uL   Immature Granulocytes 0 %   Abs Immature Granulocytes 0.01 0.00 - 0.07 K/uL  Magnesium     Status: Abnormal   Collection Time: 07/02/20  3:06 AM  Result Value Ref Range   Magnesium 1.4 (L) 1.7 - 2.4 mg/dL  Protein / creatinine ratio, urine     Status: Abnormal   Collection Time: 07/02/20 11:20 AM  Result Value  Ref Range   Creatinine, Urine 36.88 mg/dL   Total Protein, Urine 962 mg/dL   Protein Creatinine Ratio 26.08 (H) 0.00 - 0.15 mg/mg[Cre]    Recent Results (from the past 240 hour(s))  Resp Panel by RT-PCR (Flu A&B, Covid) Nasopharyngeal Swab     Status: None   Collection Time: 06/30/20 12:34 PM   Specimen: Nasopharyngeal Swab; Nasopharyngeal(NP) swabs in vial transport medium  Result Value Ref Range Status   SARS Coronavirus 2 by RT PCR NEGATIVE NEGATIVE Final    Comment: (NOTE) SARS-CoV-2 target nucleic acids are NOT DETECTED.  The SARS-CoV-2 RNA is generally detectable in upper respiratory specimens during the acute phase of infection. The lowest concentration of SARS-CoV-2 viral copies this assay can detect is 138 copies/mL. A negative result does not preclude SARS-Cov-2 infection and should not be used as the sole basis for treatment or other patient management decisions. A negative result may occur with  improper specimen collection/handling, submission of specimen other than nasopharyngeal swab, presence of viral mutation(s) within the areas targeted by this assay, and inadequate number of viral copies(<138 copies/mL). A negative result must be combined with clinical observations, patient history, and epidemiological information. The expected result is Negative.  Fact Sheet for Patients:  EntrepreneurPulse.com.au  Fact Sheet for Healthcare Providers:  IncredibleEmployment.be  This test is no t yet approved or cleared by the [place] FDA and  has been authorized for detection and/or diagnosis of SARS-CoV-2 by FDA under an Emergency Use Authorization (EUA). This EUA will remain  in effect (meaning this test can be used) for the duration of the COVID-19 declaration under Section 564(b)(1) of the Act, 21 U.S.C.section 360bbb-3(b)(1), unless the authorization is terminated  or revoked sooner.       Influenza A by PCR NEGATIVE NEGATIVE  Final   Influenza B by PCR NEGATIVE NEGATIVE Final    Comment: (NOTE) The Xpert Xpress SARS-CoV-2/FLU/RSV plus assay is intended as an aid in the diagnosis of influenza from Nasopharyngeal swab specimens and  should not be used as a sole basis for treatment. Nasal washings and aspirates are unacceptable for Xpert Xpress SARS-CoV-2/FLU/RSV testing.  Fact Sheet for Patients: EntrepreneurPulse.com.au  Fact Sheet for Healthcare Providers: IncredibleEmployment.be  This test is not yet approved or cleared by the [place] FDA and has been authorized for detection and/or diagnosis of SARS-CoV-2 by FDA under an Emergency Use Authorization (EUA). This EUA will remain in effect (meaning this test can be used) for the duration of the COVID-19 declaration under Section 564(b)(1) of the Act, 21 U.S.C. section 360bbb-3(b)(1), unless the authorization is terminated or revoked.  Performed at [facility], [place] [address], [place], [place] [address]      Radiology Studies: No results found. US RENAL  Final Result    DG Chest Port 1 View  Final Result    VAS [place] UPPER EXT VEIN MAPPING (PRE-OP AVF)    (Results Pending)    Scheduled Meds:  atovaquone  1,500 mg Oral Q breakfast   heparin  5,000 Units Subcutaneous Q8H   kidney failure book   Does not apply Once   multivitamin  1 tablet Oral QHS   sevelamer carbonate  800 mg Oral TID WC   sodium bicarbonate  1,300 mg Oral TID   PRN Meds: acetaminophen **OR** acetaminophen, calcium carbonate (dosed in mg elemental calcium), camphor-menthol **AND** hydrOXYzine, docusate sodium, feeding supplement (NEPRO CARB STEADY), guaiFENesin-dextromethorphan, loperamide, ondansetron **OR** ondansetron (ZOFRAN) IV, sorbitol, zolpidem Continuous Infusions:  albumin human 25 g (07/02/20 0823)   sodium bicarbonate (isotonic) 150 mEq in D5W 1000 mL infusion 100 mL/hr at 07/02/20 1012     LOS: 2 days  Time  spent: Greater than 50% of the 35 minute visit was spent in counseling/coordination of care for the patient as laid out in the A&P.   Dwyane Dee, MD [facility] 07/02/2020, 3:03 PM

## 2020-07-03 ENCOUNTER — Inpatient Hospital Stay (HOSPITAL_COMMUNITY): Payer: POS

## 2020-07-03 DIAGNOSIS — D61818 Other pancytopenia: Secondary | ICD-10-CM | POA: Diagnosis not present

## 2020-07-03 DIAGNOSIS — B20 Human immunodeficiency virus [HIV] disease: Secondary | ICD-10-CM | POA: Diagnosis not present

## 2020-07-03 DIAGNOSIS — N179 Acute kidney failure, unspecified: Secondary | ICD-10-CM

## 2020-07-03 DIAGNOSIS — R197 Diarrhea, unspecified: Secondary | ICD-10-CM | POA: Diagnosis not present

## 2020-07-03 DIAGNOSIS — R030 Elevated blood-pressure reading, without diagnosis of hypertension: Secondary | ICD-10-CM

## 2020-07-03 DIAGNOSIS — N186 End stage renal disease: Secondary | ICD-10-CM

## 2020-07-03 DIAGNOSIS — N184 Chronic kidney disease, stage 4 (severe): Secondary | ICD-10-CM

## 2020-07-03 LAB — CBC WITH DIFFERENTIAL/PLATELET
Abs Immature Granulocytes: 0.01 10*3/uL (ref 0.00–0.07)
Basophils Absolute: 0 10*3/uL (ref 0.0–0.1)
Basophils Relative: 0 %
Eosinophils Absolute: 0.2 10*3/uL (ref 0.0–0.5)
Eosinophils Relative: 10 %
HCT: 28.3 % — ABNORMAL LOW (ref 36.0–46.0)
Hemoglobin: 9.7 g/dL — ABNORMAL LOW (ref 12.0–15.0)
Immature Granulocytes: 1 %
Lymphocytes Relative: 24 %
Lymphs Abs: 0.5 10*3/uL — ABNORMAL LOW (ref 0.7–4.0)
MCH: 29 pg (ref 26.0–34.0)
MCHC: 34.3 g/dL (ref 30.0–36.0)
MCV: 84.5 fL (ref 80.0–100.0)
Monocytes Absolute: 0.3 10*3/uL (ref 0.1–1.0)
Monocytes Relative: 14 %
Neutro Abs: 1.1 10*3/uL — ABNORMAL LOW (ref 1.7–7.7)
Neutrophils Relative %: 51 %
Platelets: 137 10*3/uL — ABNORMAL LOW (ref 150–400)
RBC: 3.35 MIL/uL — ABNORMAL LOW (ref 3.87–5.11)
RDW: 15 % (ref 11.5–15.5)
WBC: 2.2 10*3/uL — ABNORMAL LOW (ref 4.0–10.5)
nRBC: 0 % (ref 0.0–0.2)

## 2020-07-03 LAB — GASTROINTESTINAL PANEL BY PCR, STOOL (REPLACES STOOL CULTURE)

## 2020-07-03 LAB — RENAL FUNCTION PANEL
Albumin: 2 g/dL — ABNORMAL LOW (ref 3.5–5.0)
Anion gap: 12 (ref 5–15)
BUN: 59 mg/dL — ABNORMAL HIGH (ref 6–20)
CO2: 25 mmol/L (ref 22–32)
Calcium: 6.6 mg/dL — ABNORMAL LOW (ref 8.9–10.3)
Chloride: 108 mmol/L (ref 98–111)
Creatinine, Ser: 9.16 mg/dL — ABNORMAL HIGH (ref 0.44–1.00)
GFR, Estimated: 5 mL/min — ABNORMAL LOW (ref >60)
Glucose, Bld: 94 mg/dL (ref 70–99)
Phosphorus: 5.6 mg/dL — ABNORMAL HIGH (ref 2.5–4.6)
Potassium: 2.9 mmol/L — ABNORMAL LOW (ref 3.5–5.1)
Sodium: 145 mmol/L (ref 135–145)

## 2020-07-03 LAB — C DIFFICILE QUICK SCREEN W PCR REFLEX
C Diff antigen: NEGATIVE
C Diff interpretation: NOT DETECTED
C Diff toxin: NEGATIVE

## 2020-07-03 LAB — SURGICAL PCR SCREEN
MRSA, PCR: NEGATIVE
Staphylococcus aureus: NEGATIVE

## 2020-07-03 LAB — HEPATITIS B CORE ANTIBODY, TOTAL: Hep B Core Total Ab: NONREACTIVE

## 2020-07-03 LAB — HEPATITIS B SURFACE ANTIGEN: Hepatitis B Surface Ag: NONREACTIVE

## 2020-07-03 LAB — MAGNESIUM: Magnesium: 1.3 mg/dL — ABNORMAL LOW (ref 1.7–2.4)

## 2020-07-03 LAB — HEPATITIS C ANTIBODY: HCV Ab: NONREACTIVE

## 2020-07-03 LAB — RPR: RPR Ser Ql: NONREACTIVE

## 2020-07-03 LAB — HEPATITIS A ANTIBODY, TOTAL: hep A Total Ab: NONREACTIVE

## 2020-07-03 MED ORDER — LABETALOL HCL 5 MG/ML IV SOLN: 10.0000 mg | INTRAVENOUS | Status: DC | PRN

## 2020-07-03 MED ORDER — HYDRALAZINE HCL 25 MG PO TABS: 25.0000 mg | ORAL_TABLET | ORAL | Status: DC | PRN

## 2020-07-03 MED ORDER — POTASSIUM CHLORIDE CRYS ER 20 MEQ PO TBCR
40.0000 meq | EXTENDED_RELEASE_TABLET | Freq: Four times a day (QID) | ORAL | Status: AC
  Administered 2020-07-03 (×3): 40 meq via ORAL
  Filled 2020-07-03 (×3): qty 2

## 2020-07-03 MED ORDER — AZITHROMYCIN 500 MG PO TABS
500.0000 mg | ORAL_TABLET | Freq: Every day | ORAL | Status: AC
  Administered 2020-07-03 – 2020-07-05 (×2): 500 mg via ORAL
  Filled 2020-07-03 (×2): qty 1

## 2020-07-03 MED ORDER — DEXTROMETHORPHAN POLISTIREX ER 30 MG/5ML PO SUER
30.0000 mg | Freq: Four times a day (QID) | ORAL | Status: DC | PRN
  Administered 2020-07-03: 30 mg via ORAL
  Filled 2020-07-03 (×2): qty 5

## 2020-07-03 MED ORDER — BICTEGRAVIR-EMTRICITAB-TENOFOV 50-200-25 MG PO TABS
1.0000 | ORAL_TABLET | Freq: Every day | ORAL | Status: DC
  Administered 2020-07-03 – 2020-07-12 (×10): 1 via ORAL
  Filled 2020-07-03 (×11): qty 1

## 2020-07-03 MED ORDER — CALCIUM GLUCONATE-NACL 2-0.675 GM/100ML-% IV SOLN
2.0000 g | Freq: Once | INTRAVENOUS | Status: AC
  Administered 2020-07-03: 2000 mg via INTRAVENOUS
  Filled 2020-07-03: qty 100

## 2020-07-03 MED ORDER — VANCOMYCIN HCL IN DEXTROSE 1-5 GM/200ML-% IV SOLN
1000.0000 mg | INTRAVENOUS | Status: AC
  Administered 2020-07-04: 1000 mg via INTRAVENOUS
  Filled 2020-07-03 (×2): qty 200

## 2020-07-03 MED ORDER — MAGNESIUM SULFATE 2 GM/50ML IV SOLN
2.0000 g | Freq: Once | INTRAVENOUS | Status: AC
  Administered 2020-07-03: 2 g via INTRAVENOUS
  Filled 2020-07-03: qty 50

## 2020-07-03 MED ORDER — MENTHOL 3 MG MT LOZG
1.0000 | LOZENGE | OROMUCOSAL | Status: DC | PRN
  Filled 2020-07-03: qty 9

## 2020-07-03 NOTE — Consult Note (Signed)
[facility] for Infectious Disease    Date of Admission:  06/30/2020     Current antibiotics: Day 2 atovaquone  Reason for Consult: HIV management     Referring Physician: Dr Sabino Gasser  ASSESSMENT:    # HIV/AIDS # Acute on CKD4 # FSGS, HIVAN # Pancytopenia  PLAN:    -- I think she would benefit from restarting her ART.  I discussed with patient and she is interested in doing so and having close follow up at [facility] -- will start [place] -- continue PJP ppx -- monitor closely for IRIS -- will send other HIV screening labs  MEDICATIONS:    Scheduled Meds: . atovaquone  1,500 mg Oral Q breakfast  . heparin  5,000 Units Subcutaneous Q8H  . kidney failure book   Does not apply Once  . multivitamin  1 tablet Oral QHS  . potassium chloride  40 mEq Oral Q6H  . sevelamer carbonate  800 mg Oral TID WC  . sodium bicarbonate  1,300 mg Oral TID    Continuous Infusions: . calcium gluconate    . magnesium sulfate bolus IVPB      PRN Meds: acetaminophen **OR** acetaminophen, calcium carbonate (dosed in mg elemental calcium), camphor-menthol **AND** hydrOXYzine, docusate sodium, feeding supplement (NEPRO CARB STEADY), loperamide, ondansetron **OR** ondansetron (ZOFRAN) IV, sorbitol, zolpidem  HPI:    Brooke Baldwin is a 44 y.o. 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  She was found to have worsening kidney function with a creatinine of about 10.  She follows typically with Dr. Moshe Cipro as her primary nephrologist last seen in the clinic a few days prior to admission and found to have worsening kidney function at that time as well.  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  She has been unable to tolerate prednisone in the past and has a baseline creatinine of around 2.  As noted her admission creatinine is 10 and AKI was thought to be secondary to progression of collapsing FSGS most likely high van or some component of dehydration.  Her UA had evidence of proteinuria and creatinine was mildly improved with hydration.  Regarding her HIV,  She was diagnosed about 20 years ago via routine screening.  She was previously in care in [place].  Prior regimen that she recalls is Atripla and now most recently Tivicay and Descovy.  She states she was suppressed on medications but also has been on Bactrim previously for OI prevention.  She stopped her meds a year ago when she was unable to get seen for follow up.  She is unaware of any resistance mutations and states her prior provider said she could "take anything."  Her most recent CD4 count per nephrology notes was 5 when checked in the office.  She previously received care in [place] but these records are unavailable.  During this admission her HIV screen was reactive, CD4 count is less than 35.  She was started on atovaquone yesterday for P JP prophylaxis.   Past Medical History:  Diagnosis Date  . HIV (human immunodeficiency virus infection) ([place])   . Hypertension   .  Trigeminal neuralgia     Social History   Tobacco Use  . Smoking status: Never Smoker  . Smokeless tobacco: Never Used  Vaping Use  . Vaping Use: Never used  Substance Use Topics  . Alcohol use: Yes    Comment: occasional  . Drug use: Not Currently    Family History  Problem Relation Age of Onset  . Diabetes Mother   . Healthy Father     Allergies  Allergen Reactions  . Baclofen Other (See Comments)    Caused intolerable dizziness   . Gabapentin Other (See Comments)    Caused intolerable dizziness  . Penicillins Other (See Comments)    Unknown childhood reaction      Review of Systems  Constitutional: Negative for chills and fever.  HENT: Negative.   Eyes: Negative.   Respiratory: Positive for cough. Negative for shortness of breath.   Cardiovascular: Negative.   Gastrointestinal: Positive for diarrhea. Negative for abdominal pain.  Genitourinary: Negative.   Musculoskeletal: Negative.   Skin: Negative.   Neurological: Negative for weakness and headaches.  Endo/Heme/Allergies: Negative.   Psychiatric/Behavioral: Negative.     All other systems reviewed and are negative.  OBJECTIVE:   Blood pressure (!) 145/95, pulse 88, temperature 98.9 F (37.2 C), temperature source Oral, resp. rate 17, height 5\' 2"  (1.575 m), weight 97.9 kg, last menstrual period 04/02/2015, SpO2 95 %. Body mass index is 39.47 kg/m.  Physical Exam Constitutional:      General: She is not in acute distress.    Appearance: Normal appearance.  HENT:     Head: Normocephalic and atraumatic.     Nose: Nose normal.  Eyes:     Extraocular Movements: Extraocular movements intact.     Conjunctiva/sclera: Conjunctivae normal.  Pulmonary:     Effort: Pulmonary effort is normal. No respiratory distress.  Musculoskeletal:        General: Normal range of motion.     Cervical back: Normal range of motion and neck supple.  Skin:    General: Skin is warm and dry.  Neurological:     General: No focal deficit present.     Mental Status: She is alert and oriented to person, place, and time.  Psychiatric:        Mood and Affect: Mood normal.        Behavior: Behavior normal.        Lab Results & Microbiology Lab Results  Component Value Date   WBC 2.2 (L) 07/03/2020   HGB 9.7 (L) 07/03/2020   HCT 28.3 (L) 07/03/2020   MCV 84.5 07/03/2020   PLT 137 (L) 07/03/2020    Lab Results  Component Value Date   NA 145 07/03/2020   K 2.9 (L) 07/03/2020   CO2 25 07/03/2020   GLUCOSE 94 07/03/2020   BUN 59 (H) 07/03/2020   CREATININE 9.16 (H) 07/03/2020   CALCIUM 6.6  (L) 07/03/2020   GFRNONAA 5 (L) 07/03/2020   GFRAA 33 (L) 12/15/2019    Lab Results  Component Value Date   ALT 20 12/09/2019   AST 30 12/09/2019   ALKPHOS 125 (H) 12/09/2019   BILITOT 0.2 12/09/2019    C-Reactive Protein  No results found for: CRP  Erythrocyte Sedimentation Rate  No results found for: ESRSEDRATE    I have reviewed the micro and lab results in Epic.    Raynelle Highland for Infectious Disease [facility] [phone] pager 07/03/2020, 8:18 AM

## 2020-07-03 NOTE — Progress Notes (Signed)
GI panel came back, informed MD about results.

## 2020-07-03 NOTE — Progress Notes (Signed)
PROGRESS NOTE    Brooke Baldwin   WYO:[id number]  DOB: [birth year]  DOA: 06/30/2020     3  PCP: Patient, No Pcp Per  CC: worsening kidney function  Hospital Course: Brooke Baldwin is a 44 yo female with PMH CKD (progressive decline, currently stage V), HIV (from [place], no prior records), HTN, TGN who presented with worsening renal function.  She has been followed closely outpatient with nephrology and found to have deteriorating renal function.  She has undergone work-up in the past including renal biopsy.  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   She was admitted for fluids and close monitoring in case of need for starting on dialysis inpatient.  She underwent repeat HIV labs outpatient with nephrology, however records unable to be viewed in epic.  ID clinic has also not restarted on treatment due to awaiting records from [place].  Labs have again been ordered during this hospitalization to further confirm and guide treatment.  She states that she has been living here for approximately 2 years and has been out of treatment meds for approximately 1 year.  Previously was on Tivicay and another.  Per nephro notes, Cd4 was 5 on recent office check.   HIV labs returned and were consistent with confirming diagnosis as well as significantly low CD4 count (less than 35).  She was evaluated by infectious disease and started on Biktarvy.  Atovaquone was started for PJP prophylaxis.  She was started on IV fluids and a bicarb drip on admission. Renal response/improvement was minimal. Due to her rapid progression of renal decline, nephrology recommended patient undergo VVS consultation with plans for initiation of HD in the next several days.    Interval History:  Sitting in bed this morning feeling overall better.  States she is still voiding and denies feeling any shortness of breath or chest pain. No questions this morning.   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 - creat 2 in May and is 10 on admission - minimal response to IVF - VVS consulted; plan is for graft placement on Tuesday with ability to use for immediate HD if/when needed -Patient understands and is amenable to dialysis - bicarb drip stopped 11/28; remains on oral bicarb (CO2 has normalized) - BMP daily  Metabolic acidosis-resolved as of 07/03/2020 - due to renal failure - s/p bicarb drip initially on admission; now stopped and CO2 normal - continue oral bicarb as per nephrology  - follow BMP  Elevated blood pressure reading without diagnosis of hypertension -Has been stable without medications but starting to trend up -If remains still elevated tomorrow or requires PRN meds, may try to initiate low-dose med -For now, use PRN labetalol or hydralazine  HIV (human immunodeficiency virus infection) ([place]) - there has been difficulty confirming and obtaining CD4 count and VL outpatient - last CD4 is 5 per nephrology notes from check in office outpatient; unable to view records and ID clinic has not received records from [facility] - checking HIV (reactive), VL (in process), and CD4 (<35) - ID consulted, appreciate assistance - patient started on Biktarvy while inpatient -  starting atovaquone for PJP ppx (started on 11/27) - further treatment per ID   Diarrhea -Patient started developing diarrhea on admission, was not having any prior to admission -Diarrhea has since improving today, 07/03/2020 -Follow-up GI pathogen panel which is currently in  process - PRN imodium  Thrombocytopenia (HCC) -No prior thrombocytopenia noted on previous labs that are available for review.  Platelets were briefly low on admission, 123 and are improved today despite having received fluids on admission -Continue monitoring platelet count and if further work-up needed will pursue  Neutropenia (HCC) -Nontoxic-appearing and low suspicion for infection at this time.  Possibly due to immunosuppression from HIV - no concern for infection at this time  -Continue monitoring otherwise  Secondary hyperparathyroidism ([place]) -Continue sevelamer per nephrology -Continue renal diet  Hypoalbuminemia - Level of 1 on admission with goal of 2.5 per nephrology - currently on albumin 25 g BID until goal reached  Right trigeminal neuralgia -No home meds anymore for this? -will clarify again with patient   Antimicrobials: None  DVT prophylaxis: HSQ Code Status: Full Family Communication: None present Disposition Plan: Status is: Inpatient  Remains inpatient appropriate because:Ongoing diagnostic testing needed not appropriate for outpatient work up, Unsafe d/c plan, IV treatments appropriate due to intensity of illness or inability to take PO and Inpatient level of care appropriate due to severity of illness   Dispo: The patient is from: Home              Anticipated d/c is to: Home              Anticipated d/c date is: > 3 days              Patient currently is not medically stable to d/c.  Objective: Blood pressure (!) 145/100, pulse 81, temperature 98.3 F (36.8 C), temperature source Oral, resp. rate 18, height 5\' 2"  (1.575 m), weight 97.9 kg, last menstrual period 04/02/2015, SpO2 100 %.  Examination: General appearance: alert, cooperative and no distress Head: Normocephalic, without obvious abnormality, atraumatic Eyes: EOMI Lungs: clear to auscultation bilaterally Heart: regular rate and rhythm and S1, S2 normal Abdomen: obese, soft, NT, ND, BS  present Extremities: trace to 1+ LE edema Skin: mobility and turgor normal Neurologic: Grossly normal  Consultants:   Nephrology  Vascular surgery  Infectious disease  Procedures:   none  Data Reviewed: I have personally reviewed following labs and imaging studies Results for orders placed or performed during the hospital encounter of 06/30/20 (from the past 24 hour(s))  Renal function panel     Status: Abnormal   Collection Time: 07/03/20  4:02 AM  Result Value Ref Range   Sodium 145 135 - 145 mmol/L   Potassium 2.9 (L) 3.5 - 5.1 mmol/L   Chloride 108 98 - 111 mmol/L   CO2 25 22 - 32 mmol/L   Glucose, Bld 94 70 - 99 mg/dL   BUN 59 (H) 6 - 20 mg/dL   Creatinine, Ser 9.16 (H) 0.44 - 1.00 mg/dL   Calcium 6.6 (L) 8.9 - 10.3 mg/dL   Phosphorus 5.6 (H) 2.5 - 4.6 mg/dL   Albumin 2.0 (L) 3.5 - 5.0 g/dL   GFR, Estimated 5 (L) >60 mL/min   Anion gap 12 5 - 15  CBC with Differential/Platelet     Status: Abnormal   Collection Time: 07/03/20  4:02 AM  Result Value Ref Range   WBC 2.2 (L) 4.0 - 10.5 K/uL   RBC 3.35 (L) 3.87 - 5.11  MIL/uL   Hemoglobin 9.7 (L) 12.0 - 15.0 g/dL   HCT 28.3 (L) 36 - 46 %   MCV 84.5 80.0 - 100.0 fL   MCH 29.0 26.0 - 34.0 pg   MCHC 34.3 30.0 - 36.0 g/dL   RDW 15.0 11.5 - 15.5 %   Platelets 137 (L) 150 - 400 K/uL   nRBC 0.0 0.0 - 0.2 %   Neutrophils Relative % 51 %   Neutro Abs 1.1 (L) 1.7 - 7.7 K/uL   Lymphocytes Relative 24 %   Lymphs Abs 0.5 (L) 0.7 - 4.0 K/uL   Monocytes Relative 14 %   Monocytes Absolute 0.3 0.1 - 1.0 K/uL   Eosinophils Relative 10 %   Eosinophils Absolute 0.2 0.0 - 0.5 K/uL   Basophils Relative 0 %   Basophils Absolute 0.0 0.0 - 0.1 K/uL   Immature Granulocytes 1 %   Abs Immature Granulocytes 0.01 0.00 - 0.07 K/uL  Magnesium     Status: Abnormal   Collection Time: 07/03/20  4:02 AM  Result Value Ref Range   Magnesium 1.3 (L) 1.7 - 2.4 mg/dL  Hepatitis C antibody     Status: None   Collection Time: 07/03/20 11:07 AM   Result Value Ref Range   HCV Ab NON REACTIVE NON REACTIVE  Hepatitis A antibody, total     Status: None   Collection Time: 07/03/20 11:07 AM  Result Value Ref Range   hep A Total Ab NON REACTIVE NON REACTIVE  Hepatitis B core antibody, total     Status: None   Collection Time: 07/03/20 11:07 AM  Result Value Ref Range   Hep B Core Total Ab NON REACTIVE NON REACTIVE  Hepatitis B surface antigen     Status: None   Collection Time: 07/03/20 11:07 AM  Result Value Ref Range   Hepatitis B Surface Ag NON REACTIVE NON REACTIVE    Recent Results (from the past 240 hour(s))  Resp Panel by RT-PCR (Flu A&B, Covid) Nasopharyngeal Swab     Status: None   Collection Time: 06/30/20 12:34 PM   Specimen: Nasopharyngeal Swab; Nasopharyngeal(NP) swabs in vial transport medium  Result Value Ref Range Status   SARS Coronavirus 2 by RT PCR NEGATIVE NEGATIVE Final    Comment: (NOTE) SARS-CoV-2 target nucleic acids are NOT DETECTED.  The SARS-CoV-2 RNA is generally detectable in upper respiratory specimens during the acute phase of infection. The lowest concentration of SARS-CoV-2 viral copies this assay can detect is 138 copies/mL. A negative result does not preclude SARS-Cov-2 infection and should not be used as the sole basis for treatment or other patient management decisions. A negative result may occur with  improper specimen collection/handling, submission of specimen other than nasopharyngeal swab, presence of viral mutation(s) within the areas targeted by this assay, and inadequate number of viral copies(<138 copies/mL). A negative result must be combined with clinical observations, patient history, and epidemiological information. The expected result is Negative.  Fact Sheet for Patients:  EntrepreneurPulse.com.au  Fact Sheet for Healthcare Providers:  IncredibleEmployment.be  This test is no t yet approved or cleared by the [place] FDA and   has been authorized for detection and/or diagnosis of SARS-CoV-2 by FDA under an Emergency Use Authorization (EUA). This EUA will remain  in effect (meaning this test can be used) for the duration of the COVID-19 declaration under Section 564(b)(1) of the Act, 21 U.S.C.section 360bbb-3(b)(1), unless the authorization is terminated  or revoked sooner.  Influenza A by PCR NEGATIVE NEGATIVE Final   Influenza B by PCR NEGATIVE NEGATIVE Final    Comment: (NOTE) The Xpert Xpress SARS-CoV-2/FLU/RSV plus assay is intended as an aid in the diagnosis of influenza from Nasopharyngeal swab specimens and should not be used as a sole basis for treatment. Nasal washings and aspirates are unacceptable for Xpert Xpress SARS-CoV-2/FLU/RSV testing.  Fact Sheet for Patients: EntrepreneurPulse.com.au  Fact Sheet for Healthcare Providers: IncredibleEmployment.be  This test is not yet approved or cleared by the [place] FDA and has been authorized for detection and/or diagnosis of SARS-CoV-2 by FDA under an Emergency Use Authorization (EUA). This EUA will remain in effect (meaning this test can be used) for the duration of the COVID-19 declaration under Section 564(b)(1) of the Act, 21 U.S.C. section 360bbb-3(b)(1), unless the authorization is terminated or revoked.  Performed at [facility], [place] [address], [place], [place] [address]      Radiology Studies: No results found. US RENAL  Final Result    DG Chest Port 1 View  Final Result    VAS [place] UPPER EXT VEIN MAPPING (PRE-OP AVF)    (Results Pending)    Scheduled Meds: . atovaquone  1,500 mg Oral Q breakfast  . bictegravir-emtricitabine-tenofovir AF  1 tablet Oral Daily  . heparin  5,000 Units Subcutaneous Q8H  . kidney failure book   Does not apply Once  . multivitamin  1 tablet Oral QHS  . potassium chloride  40 mEq Oral Q6H  . sevelamer carbonate  800 mg Oral TID WC  . sodium  bicarbonate  1,300 mg Oral TID   PRN Meds: acetaminophen **OR** acetaminophen, calcium carbonate (dosed in mg elemental calcium), camphor-menthol **AND** hydrOXYzine, docusate sodium, feeding supplement (NEPRO CARB STEADY), hydrALAZINE, labetalol, loperamide, ondansetron **OR** ondansetron (ZOFRAN) IV, sorbitol, zolpidem Continuous Infusions: . [START ON 07/04/2020] vancomycin       LOS: 3 days  Time spent: Greater than 50% of the 35 minute visit was spent in counseling/coordination of care for the patient as laid out in the A&P.   Dwyane Dee, MD [facility] 07/03/2020, 1:57 PM

## 2020-07-03 NOTE — Progress Notes (Signed)
   ASSESSMENT & PLAN:  Brooke Baldwin is a 44 y.o. 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   Plan early access forearm loop AVG tomorrow to avoid catheterization in severely immunocompromised pt.   NPO after midnight.  SUBJECTIVE:  No complaints.   OBJECTIVE:  BP (!) 145/95 (BP Location: Right Arm)   Pulse 88   Temp 98.9 F (37.2 C) (Oral)   Resp 17   Ht 5\' 2"  (1.575 m)   Wt 97.9 kg   LMP 04/02/2015 (Approximate)   SpO2 95%   BMI 39.47 kg/m   Intake/Output Summary (Last 24 hours) at 07/03/2020 0838 Last data filed at 07/02/2020 1831 Gross per 24 hour  Intake 360 ml  Output 1700 ml  Net -1340 ml   Constitutional: well appearing. no acute distress. Cardiac: RRR. Vascular: 2+ L radial pulse.   CBC Latest Ref Rng & Units 07/03/2020 07/02/2020 07/01/2020  WBC 4.0 - 10.5 K/uL 2.2(L) 2.3(L) 3.0(L)  Hemoglobin 12.0 - 15.0 g/dL 9.7(L) 9.7(L) 11.5(L)  Hematocrit 36 - 46 % 28.3(L) 28.3(L) 34.5(L)  Platelets 150 - 400 K/uL 137(L) 146(L) 158     CMP Latest Ref Rng & Units 07/03/2020 07/02/2020 07/01/2020  Glucose 70 - 99 mg/dL 94 88 87  BUN 6 - 20 mg/dL 59(H) 68(H) 68(H)  Creatinine 0.44 - 1.00 mg/dL 9.16(H) 9.42(H) 9.44(H)  Sodium 135 - 145 mmol/L 145 142 141  Potassium 3.5 - 5.1 mmol/L 2.9(L) 3.3(L) 4.2  Chloride 98 - 111 mmol/L 108 113(H) 118(H)  CO2 22 - 32 mmol/L 25 16(L) 13(L)  Calcium 8.9 - 10.3 mg/dL 6.6(L) 6.9(L) 6.9(L)  Total Protein 6.0 - 8.5 g/dL - - -  Total Bilirubin 0.0 - 1.2 mg/dL - - -  Alkaline Phos 39 - 117 IU/L - - -  AST 0 - 40 IU/L - - -  ALT 0 - 32 IU/L - - -    Estimated Creatinine Clearance: 8.5 mL/min (A) (by C-G formula based on SCr of 9.16 mg/dL (H)).  Brooke Baldwin. Stanford Breed, MD [facility] Phone Number: [phone] 07/03/2020 8:38 AM

## 2020-07-03 NOTE — Progress Notes (Addendum)
Nephrology Follow-Up Consult note   Assessment/Recommendations: Brooke Baldwin is a/an 44 y.o. female with a past medical history significant for HIV, CKD, FSGS, admitted for AKI.     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  Didn't tolerate prednisone in the past. Baseline creatinine around 2 but now w/ significant rise now w/ no again improvement. 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  -Hold ARB, avoid NSAIDs, and contrast if able  Metabolic Acidosis: Secondary to AKI and diarrhea.  Now significantly improved.  Stop IV sodium bicarb.  Continue bicarb tablets  Hypertension: -BP acceptable at this time CTM.  HIV -Recommend ID consult. Poorly controlled.  They have not seen her yet, likely awaiting outside labs.  Hypoalbuminemia -Albumin has improved with supplementation.  Stop IV albumin   Secondary Hyperparathyroidism - Continue sevelamer 800mg  TID. Restrict phos in diet. -Follow-up PTH  Leukopenia -possibly secondary to HIV/bone marrow suppression? Mgmt per primary service  Hypomagnesemia/hypokalemia: IV magnesium and oral potassium today.  Continue to monitor   Recommendations conveyed to primary service.    [facility] 07/03/2020 10:00 AM  ___________________________________________________________  CC: AKI  Interval History/Subjective: Patient has persistent dry cough.  Otherwise denies any issues.  Diarrhea has resolved   Medications:  Current Facility-Administered Medications  Medication Dose Route Frequency Provider Last Rate Last Admin  . acetaminophen (TYLENOL) tablet 650 mg  650 mg Oral Q6H PRN Para Skeans, MD       Or  . acetaminophen (TYLENOL) suppository 650 mg  650 mg Rectal Q6H PRN Para Skeans, MD      . atovaquone (MEPRON) 750 MG/5ML suspension 1,500 mg  1,500 mg Oral Q breakfast Dwyane Dee, MD   1,500 mg at 07/03/20 0645  . bictegravir-emtricitabine-tenofovir AF (BIKTARVY) 50-200-25 MG per tablet 1 tablet  1 tablet Oral Daily Jule Ser N, DO      . calcium carbonate (dosed in mg elemental calcium) suspension 500 mg of elemental calcium  500 mg of elemental calcium Oral Q6H PRN Para Skeans, MD      . calcium gluconate 2 g/ 100 mL sodium chloride IVPB  2 g Intravenous Once Dwyane Dee, MD      . camphor-menthol [facility]) lotion 1 application  1 application Topical F2T PRN Para Skeans, MD       And  . hydrOXYzine (ATARAX/VISTARIL) tablet 25 mg  25 mg Oral Q8H PRN Para Skeans, MD      . docusate sodium (ENEMEEZ) enema 283 mg  1 enema Rectal PRN Para Skeans, MD      . feeding supplement (NEPRO CARB STEADY) liquid 237 mL  237 mL Oral TID PRN Para Skeans, MD      . heparin injection 5,000 Units  5,000 Units Subcutaneous Q8H Para Skeans, MD   5,000 Units at 07/03/20 [phone]  .  kidney failure book   Does not apply Once Para Skeans, MD      . loperamide (IMODIUM) capsule 2 mg  2 mg Oral Q6H PRN Dwyane Dee, MD      . multivitamin (RENA-VIT) tablet 1 tablet  1 tablet Oral QHS Para Skeans, MD   1 tablet at 07/02/20 2213  . ondansetron (ZOFRAN) tablet 4 mg  4 mg Oral Q6H PRN Para Skeans, MD       Or  . ondansetron (ZOFRAN) injection 4 mg  4 mg Intravenous  Q6H PRN Para Skeans, MD      . potassium chloride SA (KLOR-CON) CR tablet 40 mEq  40 mEq Oral Q6H Reesa Chew, MD   40 mEq at 07/03/20 0841  . sevelamer carbonate (RENVELA) tablet 800 mg  800 mg Oral TID WC Reesa Chew, MD   800 mg at 07/03/20 0646  . sodium bicarbonate tablet 1,300 mg  1,300 mg Oral TID Gean Quint, MD   1,300 mg at 07/03/20 0841  . sorbitol 70 % solution 30 mL  30 mL Oral PRN Para Skeans, MD      . Derrill Memo ON 07/04/2020] vancomycin (VANCOCIN) IVPB 1000 mg/200 mL premix  1,000 mg Intravenous To OR Theda Sers, Emma M, PA-C      . zolpidem (AMBIEN) tablet 5 mg  5 mg Oral QHS PRN Para Skeans, MD          Review of Systems: 10 systems reviewed and negative except per interval history/subjective  Physical Exam: Vitals:   07/03/20 0610 07/03/20 0817  BP: (!) 136/96 (!) 145/95  Pulse: 88 88  Resp: 18 17  Temp: 98.4 F (36.9 C) 98.9 F (37.2 C)  SpO2: 100% 95%   Total I/O In: 220 [P.O.:120; IV Piggyback:100] Out: 250 [Urine:250]  Intake/Output Summary (Last 24 hours) at 07/03/2020 1000 Last data filed at 07/03/2020 6759 Gross per 24 hour  Intake 580 ml  Output 1950 ml  Net -1370 ml   Constitutional: well-appearing, no acute distress ENMT: ears and nose without scars or lesions, MMM CV: normal rate, murmur present, 1+ pitting edema in ble Respiratory: clear to auscultation, normal work of breathing Gastrointestinal: soft, non-tender, no palpable masses or hernias Skin: no visible lesions or rashes Psych: alert, judgement/insight appropriate, appropriate mood and affect   Test Results I personally reviewed new and old clinical labs and radiology tests Lab Results  Component Value Date   NA 145 07/03/2020   K 2.9 (L) 07/03/2020   CL 108 07/03/2020   CO2 25 07/03/2020   BUN 59 (H) 07/03/2020   CREATININE 9.16 (H) 07/03/2020   CALCIUM 6.6 (L) 07/03/2020   ALBUMIN 2.0 (L) 07/03/2020   PHOS 5.6 (H) 07/03/2020

## 2020-07-03 NOTE — Anesthesia Preprocedure Evaluation (Addendum)
Anesthesia Evaluation  Patient identified by MRN, date of birth, ID band Patient awake    Reviewed: Allergy & Precautions, NPO status , Patient's Chart, lab work & pertinent test results  Airway Mallampati: II  TM Distance: >3 FB Neck ROM: Full    Dental no notable dental hx. (+) Teeth Intact, Dental Advisory Given   Pulmonary neg pulmonary ROS,    Pulmonary exam normal breath sounds clear to auscultation       Cardiovascular hypertension, Pt. on medications Normal cardiovascular exam Rhythm:Regular Rate:Normal  06/30/20 EKG - ST R 100   Neuro/Psych  Neuromuscular disease negative psych ROS   GI/Hepatic negative GI ROS, Neg liver ROS,   Endo/Other    Renal/GU ARF and CRFRenal diseaseStage V K+ 2.9 Cr 9.16     Musculoskeletal negative musculoskeletal ROS (+)   Abdominal (+) + obese,   Peds  Hematology  (+) Blood dyscrasia, anemia , HIV, Thrombocytopenia Lab Results      Component                Value               Date                      WBC                      2.2 (L)             07/03/2020                HGB                      9.7 (L)             07/03/2020                HCT                      28.3 (L)            07/03/2020                MCV                      84.5                07/03/2020                PLT                      137 (L)             07/03/2020              Anesthesia Other Findings ALL: Baclofen, Baclofen, PCN  Reproductive/Obstetrics                            Anesthesia Physical Anesthesia Plan  ASA: IV  Anesthesia Plan: MAC and Regional   Post-op Pain Management:    Induction:   PONV Risk Score and Plan: Treatment may vary due to age or medical condition  Airway Management Planned: Nasal Cannula and Natural Airway  Additional Equipment: None  Intra-op Plan:   Post-operative Plan:   Informed Consent: I have reviewed the patients History and  Physical, chart, labs and discussed the procedure including the risks, benefits and alternatives for the  proposed anesthesia with the patient or authorized representative who has indicated his/her understanding and acceptance.     Dental advisory given  Plan Discussed with: CRNA and Anesthesiologist  Anesthesia Plan Comments: (+L supraclavicular)      Anesthesia Quick Evaluation

## 2020-07-03 NOTE — Progress Notes (Signed)
VASCULAR LAB    Upper extremity vein mapping has been performed.  See CV proc for preliminary results.   Laurita Peron, RVT 07/03/2020, 4:38 PM

## 2020-07-04 ENCOUNTER — Inpatient Hospital Stay (HOSPITAL_COMMUNITY): Payer: POS | Admitting: Anesthesiology

## 2020-07-04 ENCOUNTER — Encounter (HOSPITAL_COMMUNITY)
Admission: EM | Disposition: A | Discharge status: Home / Self Care | Payer: Self-pay | Source: Home / Self Care | Attending: Internal Medicine

## 2020-07-04 ENCOUNTER — Encounter (HOSPITAL_COMMUNITY): Payer: Self-pay | Admitting: Internal Medicine

## 2020-07-04 DIAGNOSIS — N185 Chronic kidney disease, stage 5: Secondary | ICD-10-CM

## 2020-07-04 DIAGNOSIS — A040 Enteropathogenic Escherichia coli infection: Secondary | ICD-10-CM | POA: Diagnosis not present

## 2020-07-04 DIAGNOSIS — N179 Acute kidney failure, unspecified: Secondary | ICD-10-CM | POA: Diagnosis not present

## 2020-07-04 DIAGNOSIS — B20 Human immunodeficiency virus [HIV] disease: Secondary | ICD-10-CM | POA: Diagnosis not present

## 2020-07-04 DIAGNOSIS — R000 Tachycardia, unspecified: Secondary | ICD-10-CM | POA: Diagnosis not present

## 2020-07-04 DIAGNOSIS — I120 Hypertensive chronic kidney disease with stage 5 chronic kidney disease or end stage renal disease: Secondary | ICD-10-CM | POA: Diagnosis not present

## 2020-07-04 DIAGNOSIS — N184 Chronic kidney disease, stage 4 (severe): Secondary | ICD-10-CM

## 2020-07-04 HISTORY — PX: AV FISTULA PLACEMENT: SHX1204

## 2020-07-04 LAB — HIV-1/2 AB - DIFFERENTIATION
HIV 1 Ab: POSITIVE — AB
HIV 2 Ab: NEGATIVE

## 2020-07-04 LAB — HEPATIC FUNCTION PANEL
ALT: 42 U/L (ref 0–44)
AST: 82 U/L — ABNORMAL HIGH (ref 15–41)
Albumin: 1.6 g/dL — ABNORMAL LOW (ref 3.5–5.0)
Alkaline Phosphatase: 67 U/L (ref 38–126)
Bilirubin, Direct: <0.1 mg/dL (ref 0.0–0.2)
Total Bilirubin: 0.8 mg/dL (ref 0.3–1.2)
Total Protein: 4 g/dL — ABNORMAL LOW (ref 6.5–8.1)

## 2020-07-04 LAB — CBC WITH DIFFERENTIAL/PLATELET
Abs Immature Granulocytes: 0.02 10*3/uL (ref 0.00–0.07)
Basophils Absolute: 0 10*3/uL (ref 0.0–0.1)
Basophils Relative: 0 %
Eosinophils Absolute: 0.2 10*3/uL (ref 0.0–0.5)
Eosinophils Relative: 10 %
HCT: 33.8 % — ABNORMAL LOW (ref 36.0–46.0)
Hemoglobin: 11.3 g/dL — ABNORMAL LOW (ref 12.0–15.0)
Immature Granulocytes: 1 %
Lymphocytes Relative: 27 %
Lymphs Abs: 0.6 10*3/uL — ABNORMAL LOW (ref 0.7–4.0)
MCH: 28.6 pg (ref 26.0–34.0)
MCHC: 33.4 g/dL (ref 30.0–36.0)
MCV: 85.6 fL (ref 80.0–100.0)
Monocytes Absolute: 0.2 10*3/uL (ref 0.1–1.0)
Monocytes Relative: 11 %
Neutro Abs: 1.1 10*3/uL — ABNORMAL LOW (ref 1.7–7.7)
Neutrophils Relative %: 51 %
Platelets: 124 10*3/uL — ABNORMAL LOW (ref 150–400)
RBC: 3.95 MIL/uL (ref 3.87–5.11)
RDW: 15 % (ref 11.5–15.5)
WBC: 2.1 10*3/uL — ABNORMAL LOW (ref 4.0–10.5)
nRBC: 0 % (ref 0.0–0.2)

## 2020-07-04 LAB — RENAL FUNCTION PANEL
Albumin: 1.6 g/dL — ABNORMAL LOW (ref 3.5–5.0)
Anion gap: 12 (ref 5–15)
BUN: 55 mg/dL — ABNORMAL HIGH (ref 6–20)
CO2: 22 mmol/L (ref 22–32)
Calcium: 7.2 mg/dL — ABNORMAL LOW (ref 8.9–10.3)
Chloride: 112 mmol/L — ABNORMAL HIGH (ref 98–111)
Creatinine, Ser: 9.82 mg/dL — ABNORMAL HIGH (ref 0.44–1.00)
GFR, Estimated: 5 mL/min — ABNORMAL LOW (ref >60)
Glucose, Bld: 84 mg/dL (ref 70–99)
Phosphorus: 5.9 mg/dL — ABNORMAL HIGH (ref 2.5–4.6)
Potassium: 4 mmol/L (ref 3.5–5.1)
Sodium: 146 mmol/L — ABNORMAL HIGH (ref 135–145)

## 2020-07-04 LAB — HEPATITIS B SURFACE ANTIBODY, QUANTITATIVE: Hep B S AB Quant (Post): <3.1 m[IU]/mL — ABNORMAL LOW (ref >9.9)

## 2020-07-04 LAB — HIV-1 RNA QUANT-NO REFLEX-BLD
HIV 1 RNA Quant: 2420000 copies/mL
LOG10 HIV-1 RNA: 6.384 log10copy/mL

## 2020-07-04 LAB — MAGNESIUM: Magnesium: 1.8 mg/dL (ref 1.7–2.4)

## 2020-07-04 LAB — PARATHYROID HORMONE, INTACT (NO CA): PTH: 506 pg/mL — ABNORMAL HIGH (ref 15–65)

## 2020-07-04 SURGERY — INSERTION OF ARTERIOVENOUS (AV) GORE-TEX GRAFT ARM
Anesthesia: General | Site: Arm Lower | Laterality: Left

## 2020-07-04 MED ORDER — PROPOFOL 500 MG/50ML IV EMUL
INTRAVENOUS | Status: DC | PRN
  Administered 2020-07-04: 50 ug/kg/min via INTRAVENOUS

## 2020-07-04 MED ORDER — MIDAZOLAM HCL 5 MG/5ML IJ SOLN
INTRAMUSCULAR | Status: DC | PRN
  Administered 2020-07-04: 1 mg via INTRAVENOUS

## 2020-07-04 MED ORDER — SODIUM CHLORIDE 0.9 % IV SOLN
INTRAVENOUS | Status: AC
  Filled 2020-07-04: qty 1.2

## 2020-07-04 MED ORDER — HEMOSTATIC AGENTS (NO CHARGE) OPTIME
TOPICAL | Status: DC | PRN
  Administered 2020-07-04: 1 via TOPICAL

## 2020-07-04 MED ORDER — LIDOCAINE HCL (CARDIAC) PF 100 MG/5ML IV SOSY
PREFILLED_SYRINGE | INTRAVENOUS | Status: DC | PRN
  Administered 2020-07-04: 60 mg via INTRAVENOUS

## 2020-07-04 MED ORDER — SODIUM CHLORIDE 0.9 % IV SOLN: INTRAVENOUS | Status: DC | PRN

## 2020-07-04 MED ORDER — MIDAZOLAM HCL 2 MG/2ML IJ SOLN
INTRAMUSCULAR | Status: AC
  Filled 2020-07-04: qty 2

## 2020-07-04 MED ORDER — FENTANYL CITRATE (PF) 250 MCG/5ML IJ SOLN
INTRAMUSCULAR | Status: AC
  Filled 2020-07-04: qty 5

## 2020-07-04 MED ORDER — FENTANYL CITRATE (PF) 100 MCG/2ML IJ SOLN
INTRAMUSCULAR | Status: DC | PRN
  Administered 2020-07-04: 50 ug via INTRAVENOUS

## 2020-07-04 MED ORDER — AMISULPRIDE (ANTIEMETIC) 5 MG/2ML IV SOLN: 10.0000 mg | Freq: Once | INTRAVENOUS | Status: DC | PRN

## 2020-07-04 MED ORDER — HEPARIN SODIUM (PORCINE) 1000 UNIT/ML IJ SOLN
INTRAMUSCULAR | Status: DC | PRN
  Administered 2020-07-04: 5000 [IU] via INTRAVENOUS

## 2020-07-04 MED ORDER — PHENYLEPHRINE HCL-NACL 10-0.9 MG/250ML-% IV SOLN
INTRAVENOUS | Status: DC | PRN
  Administered 2020-07-04: 50 ug/min via INTRAVENOUS

## 2020-07-04 MED ORDER — 0.9 % SODIUM CHLORIDE (POUR BTL) OPTIME
TOPICAL | Status: DC | PRN
  Administered 2020-07-04: 1000 mL

## 2020-07-04 MED ORDER — ONDANSETRON HCL 4 MG/2ML IJ SOLN: 4.0000 mg | Freq: Once | INTRAMUSCULAR | Status: DC | PRN

## 2020-07-04 MED ORDER — ROPIVACAINE HCL 7.5 MG/ML IJ SOLN
INTRAMUSCULAR | Status: DC | PRN
  Administered 2020-07-04: 20 mL via PERINEURAL

## 2020-07-04 MED ORDER — LIDOCAINE-EPINEPHRINE (PF) 1 %-1:200000 IJ SOLN
INTRAMUSCULAR | Status: AC
  Filled 2020-07-04: qty 30

## 2020-07-04 MED ORDER — ACETAMINOPHEN 10 MG/ML IV SOLN: 1000.0000 mg | Freq: Once | INTRAVENOUS | Status: DC | PRN

## 2020-07-04 MED ORDER — PHENYLEPHRINE 40 MCG/ML (10ML) SYRINGE FOR IV PUSH (FOR BLOOD PRESSURE SUPPORT)
PREFILLED_SYRINGE | INTRAVENOUS | Status: DC | PRN
  Administered 2020-07-04 (×3): 80 ug via INTRAVENOUS

## 2020-07-04 MED ORDER — HYDROMORPHONE HCL 1 MG/ML IJ SOLN: 0.2500 mg | INTRAMUSCULAR | Status: DC | PRN

## 2020-07-04 MED ORDER — LIDOCAINE HCL (PF) 1 % IJ SOLN
INTRAMUSCULAR | Status: AC
  Filled 2020-07-04: qty 30

## 2020-07-04 MED ORDER — CLONIDINE HCL (ANALGESIA) 100 MCG/ML EP SOLN
EPIDURAL | Status: DC | PRN
  Administered 2020-07-04: 100 ug

## 2020-07-04 MED ORDER — PROPOFOL 10 MG/ML IV BOLUS
INTRAVENOUS | Status: DC | PRN
  Administered 2020-07-04: 20 mg via INTRAVENOUS

## 2020-07-04 MED ORDER — OXYCODONE HCL 5 MG PO TABS: 5.0000 mg | ORAL_TABLET | Freq: Once | ORAL | Status: DC | PRN

## 2020-07-04 MED ORDER — OXYCODONE HCL 5 MG/5ML PO SOLN: 5.0000 mg | Freq: Once | ORAL | Status: DC | PRN

## 2020-07-04 SURGICAL SUPPLY — 49 items
ADH SKN CLS APL DERMABOND .7 (GAUZE/BANDAGES/DRESSINGS) ×1
APL PRP STRL LF DISP 70% ISPRP (MISCELLANEOUS) ×1
APL SKNCLS STERI-STRIP NONHPOA (GAUZE/BANDAGES/DRESSINGS) ×1
ARMBAND PINK RESTRICT EXTREMIT (MISCELLANEOUS) ×2 IMPLANT
BENZOIN TINCTURE PRP APPL 2/3 (GAUZE/BANDAGES/DRESSINGS) ×2 IMPLANT
CANISTER SUCT 3000ML PPV (MISCELLANEOUS) ×2 IMPLANT
CANNULA VESSEL 3MM 2 BLNT TIP (CANNULA) ×2 IMPLANT
CHLORAPREP W/TINT 26 (MISCELLANEOUS) ×2 IMPLANT
CLIP VESOCCLUDE MED 6/CT (CLIP) ×2 IMPLANT
CLIP VESOCCLUDE SM WIDE 6/CT (CLIP) ×4 IMPLANT
CLSR STERI-STRIP ANTIMIC 1/2X4 (GAUZE/BANDAGES/DRESSINGS) ×2 IMPLANT
COVER MAYO STAND STRL (DRAPES) ×2 IMPLANT
COVER PROBE W GEL 5X96 (DRAPES) ×2 IMPLANT
COVER WAND RF STERILE (DRAPES) IMPLANT
DERMABOND ADVANCED (GAUZE/BANDAGES/DRESSINGS) ×1
DERMABOND ADVANCED .7 DNX12 (GAUZE/BANDAGES/DRESSINGS) ×1 IMPLANT
ELECT REM PT RETURN 9FT ADLT (ELECTROSURGICAL) ×2
ELECTRODE REM PT RTRN 9FT ADLT (ELECTROSURGICAL) ×1 IMPLANT
GLOVE BIO SURGEON STRL SZ 6.5 (GLOVE) ×2 IMPLANT
GLOVE BIO SURGEON STRL SZ8 (GLOVE) ×2 IMPLANT
GLOVE BIOGEL PI IND STRL 7.5 (GLOVE) ×1 IMPLANT
GLOVE BIOGEL PI INDICATOR 7.5 (GLOVE) ×1
GLOVE ECLIPSE 7.0 STRL STRAW (GLOVE) ×2 IMPLANT
GLOVE SURG SS PI 6.5 STRL IVOR (GLOVE) ×2 IMPLANT
GLOVE SURG UNDER POLY LF SZ7 (GLOVE) ×2 IMPLANT
GOWN STRL REUS W/ TWL LRG LVL3 (GOWN DISPOSABLE) ×3 IMPLANT
GOWN STRL REUS W/ TWL XL LVL3 (GOWN DISPOSABLE) ×1 IMPLANT
GOWN STRL REUS W/TWL LRG LVL3 (GOWN DISPOSABLE) ×6
GOWN STRL REUS W/TWL XL LVL3 (GOWN DISPOSABLE) ×2
GRAFT VASC ACUSEAL 4-7X45 (Vascular Products) ×2 IMPLANT
HEMOSTAT SNOW SURGICEL 2X4 (HEMOSTASIS) IMPLANT
HEMOSTAT SURGICEL 2X14 (HEMOSTASIS) ×2 IMPLANT
INSERT FOGARTY SM (MISCELLANEOUS) ×2 IMPLANT
KIT BASIN OR (CUSTOM PROCEDURE TRAY) ×2 IMPLANT
KIT TURNOVER KIT B (KITS) ×2 IMPLANT
LOOP VESSEL MINI RED (MISCELLANEOUS) ×2 IMPLANT
NS IRRIG 1000ML POUR BTL (IV SOLUTION) ×2 IMPLANT
PACK CV ACCESS (CUSTOM PROCEDURE TRAY) ×2 IMPLANT
PAD ARMBOARD 7.5X6 YLW CONV (MISCELLANEOUS) ×4 IMPLANT
SUT GORETEX 6.0 TT9 (SUTURE) ×4 IMPLANT
SUT MNCRL AB 4-0 PS2 18 (SUTURE) ×2 IMPLANT
SUT PROLENE 6 0 BV (SUTURE) ×4 IMPLANT
SUT SILK 2 0 SH (SUTURE) ×6 IMPLANT
SUT VIC AB 3-0 SH 27 (SUTURE) ×4
SUT VIC AB 3-0 SH 27X BRD (SUTURE) ×2 IMPLANT
SYR TOOMEY 50ML (SYRINGE) IMPLANT
TOWEL GREEN STERILE (TOWEL DISPOSABLE) ×2 IMPLANT
UNDERPAD 30X36 HEAVY ABSORB (UNDERPADS AND DIAPERS) ×2 IMPLANT
WATER STERILE IRR 1000ML POUR (IV SOLUTION) ×2 IMPLANT

## 2020-07-04 NOTE — Anesthesia Postprocedure Evaluation (Signed)
Anesthesia Post Note  Patient: Brooke Baldwin  Procedure(s) Performed: INSERTION OF LEFT ARTERIOVENOUS (AV) GORE-TEX GRAFT ARM (ACUSEAL) (Left Arm Lower)     Patient location during evaluation: PACU Anesthesia Type: MAC and Regional Level of consciousness: awake and alert Pain management: pain level controlled Vital Signs Assessment: post-procedure vital signs reviewed and stable Respiratory status: spontaneous breathing, nonlabored ventilation, respiratory function stable and patient connected to nasal cannula oxygen Cardiovascular status: stable and blood pressure returned to baseline Postop Assessment: no apparent nausea or vomiting Anesthetic complications: no   No complications documented.  Last Vitals:  Vitals:   07/04/20 1009 07/04/20 1022  BP: 96/75 102/74  Pulse: 91 86  Resp: 19 17  Temp: 36.7 C   SpO2: 96% 95%    Last Pain:  Vitals:   07/04/20 1022  TempSrc:   PainSc: 0-No pain                 Barnet Glasgow

## 2020-07-04 NOTE — Transfer of Care (Signed)
Immediate Anesthesia Transfer of Care Note  Patient: Denaja Verhoeven  Procedure(s) Performed: INSERTION OF LEFT ARTERIOVENOUS (AV) GORE-TEX GRAFT ARM (ACUSEAL) (Left Arm Lower)  Patient Location: PACU  Anesthesia Type:MAC and Regional  Level of Consciousness: awake and alert   Airway & Oxygen Therapy: Patient Spontanous Breathing and Patient connected to face mask oxygen  Post-op Assessment: Report given to RN and Post -op Vital signs reviewed and stable  Post vital signs: Reviewed and stable  Last Vitals:  Vitals Value Taken Time  BP    Temp    Pulse 90 07/04/20 1007  Resp 25 07/04/20 1007  SpO2 97 % 07/04/20 1007  Vitals shown include unvalidated device data.  Last Pain:  Vitals:   07/04/20 0443  TempSrc: Oral  PainSc: 0-No pain         Complications: No complications documented.

## 2020-07-04 NOTE — Progress Notes (Signed)
PROGRESS NOTE    Brooke Baldwin   JXB:[id number]  DOB: [birth year]  DOA: 06/30/2020     4  PCP: Patient, No Pcp Per  CC: worsening kidney function  Hospital Course: Ms. Tall is a 44 yo female with PMH CKD (progressive decline, currently stage V), HIV (from [place], no prior records), HTN, TGN who presented with worsening renal function.  She has been followed closely outpatient with nephrology and found to have deteriorating renal function.  She has undergone work-up in the past including renal biopsy.  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   She was admitted for fluids and close monitoring in case of need for starting on dialysis inpatient.  She underwent repeat HIV labs outpatient with nephrology, however records unable to be viewed in epic.  ID clinic has also not restarted on treatment due to awaiting records from [place].  Labs have again been ordered during this hospitalization to further confirm and guide treatment.  She states that she has been living here for approximately 2 years and has been out of treatment meds for approximately 1 year.  Previously was on T[place]ay and another.  Per nephro notes, Cd4 was 5 on recent office check.   HIV labs returned and were consistent with confirming diagnosis as well as significantly low CD4 count (less than 35).  She was evaluated by infectious disease and started on Biktarvy.  Atovaquone was started for PJP prophylaxis.  She was started on IV fluids and a bicarb drip on admission. Renal response/improvement was minimal. Due to her rapid progression of renal decline, nephrology recommended patient undergo VVS consultation with plans for initiation of HD in the next several days.  She underwent placement of left forearm Gore-Tex AV graft on 07/04/2020.   Interval History:  Seen in her room after surgery.  Left arm still numb from nerve block.  Family member bedside.  She otherwise understands ongoing  plan for initiating dialysis in the next coming days per nephrology.  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 - creat 2 in May and is 10 on admission - minimal response to IVF - VVS consulted; patient underwent left AV graft placement on 07/04/2020 -Patient understands and is amenable to dialysis - bicarb drip stopped 11/28; remains on oral bicarb (CO2 has normalized) - BMP daily  Metabolic acidosis-resolved as of 07/03/2020 - due to renal failure - s/p bicarb drip initially on admission; now stopped and CO2 normal - continue oral bicarb as per nephrology  - follow BMP  Elevated blood pressure reading without diagnosis of hypertension -Has been stable without medications but starting to trend up -If remains still elevated tomorrow or requires PRN meds, may try to initiate low-dose med -For now, use PRN labetalol or hydralazine  HIV (human immunodeficiency virus infection) ([place]) - there has been difficulty confirming and obtaining CD4 count and VL outpatient - last CD4 is 5 per nephrology notes from check in office outpatient; unable to view records and ID clinic has not received records from [facility] - checking HIV (reactive), VL (in process), and CD4 (<35) - ID consulted, appreciate  assistance - patient started on Biktarvy while inpatient - starting atovaquone for PJP ppx (started on 11/27) - further treatment per ID   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  Typically self resolving however in setting of her immunocompromised state, will initiate on 3-day course of azithromycin - PRN imodium  Thrombocytopenia ([place]) -No prior thrombocytopenia noted on previous labs that are available for review.  Platelets were briefly low on admission, 123 and have been up and down - possibly some contribution from EPEC infection as well - continue trending PLTC  Neutropenia ([facility]) -Nontoxic-appearing and low suspicion for infection at this time.  Possibly due to immunosuppression from HIV and/or GI illness -Continue monitoring otherwise  Secondary hyperparathyroidism ([place]) -Continue sevelamer per nephrology -Continue renal diet  Hypoalbuminemia - Level of 1 on admission with goal of 2.5 per nephrology - currently on albumin 25 g BID until goal reached  Right trigeminal neuralgia -No home meds anymore for this? -will clarify again with patient   Antimicrobials: None  DVT prophylaxis: HSQ Code Status: Full Family Communication: None present Disposition Plan: Status is: Inpatient  Remains inpatient appropriate because:Ongoing diagnostic testing needed not appropriate for outpatient work up, Unsafe d/c plan, IV treatments appropriate due to intensity of illness or inability to take PO and Inpatient level of care appropriate due to severity of illness   Dispo: The patient is from: Home              Anticipated d/c is to: Home              Anticipated d/c date is: > 3 days              Patient currently is not medically stable to d/c.  Objective: Blood pressure 101/80, pulse 88, temperature 98.1 F (36.7 C), temperature source Oral, resp. rate 18, height 5\' 2"  (1.575 m), weight 96.6 kg, last menstrual period 04/02/2015, SpO2 95 %.  Examination: General appearance: alert, cooperative and no distress Head: Normocephalic, without obvious abnormality, atraumatic Eyes: EOMI Lungs: clear to  auscultation bilaterally Heart: regular rate and rhythm and S1, S2 normal Abdomen: obese, soft, NT, ND, BS present Extremities: trace to 1+ LE edema; left forearm noted with surgical sites Skin: mobility and turgor normal Neurologic: Grossly normal  Consultants:   Nephrology  Vascular surgery  Infectious disease  Procedures:   none  Data Reviewed: I have personally reviewed following labs and imaging studies Results for orders placed or performed during the hospital encounter of 06/30/20 (from the past 24 hour(s))  Renal function panel     Status: Abnormal   Collection Time: 07/04/20  2:33 AM  Result Value Ref Range   Sodium 146 (H) 135 - 145 mmol/L   Potassium 4.0 3.5 - 5.1 mmol/L   Chloride 112 (H) 98 - 111 mmol/L   CO2 22 22 - 32 mmol/L   Glucose, Bld 84 70 - 99 mg/dL   BUN 55 (H) 6 - 20 mg/dL   Creatinine, Ser 9.82 (H) 0.44 - 1.00 mg/dL   Calcium 7.2 (L) 8.9 - 10.3 mg/dL   Phosphorus 5.9 (H) 2.5 - 4.6 mg/dL   Albumin 1.6 (L) 3.5 - 5.0 g/dL   GFR, Estimated 5 (L) >60 mL/min   Anion gap 12 5 - 15  CBC with Differential/Platelet     Status: Abnormal   Collection Time: 07/04/20  2:33 AM  Result Value Ref Range   WBC 2.1 (L) 4.0 - 10.5 K/uL   RBC 3.95 3.87 - 5.11 MIL/uL   Hemoglobin 11.3 (L) 12.0 - 15.0 g/dL   HCT 33.8 (L) 36 - 46 %   MCV 85.6 80.0 - 100.0 fL   MCH 28.6 26.0 - 34.0 pg   MCHC 33.4 30.0 - 36.0 g/dL   RDW 15.0 11.5 - 15.5 %   Platelets 124 (L) 150 - 400 K/uL   nRBC 0.0 0.0 - 0.2 %   Neutrophils Relative % 51 %   Neutro Abs 1.1 (L) 1.7 - 7.7 K/uL   Lymphocytes Relative 27 %   Lymphs Abs 0.6 (L) 0.7 - 4.0 K/uL   Monocytes Relative 11 %   Monocytes Absolute 0.2 0.1 - 1.0 K/uL   Eosinophils Relative 10 %   Eosinophils Absolute 0.2 0.0 - 0.5 K/uL   Basophils Relative 0 %   Basophils Absolute 0.0 0.0 - 0.1 K/uL   Immature Granulocytes 1 %   Abs Immature Granulocytes 0.02 0.00 - 0.07 K/uL  Magnesium     Status: None   Collection Time: 07/04/20  2:33  AM  Result Value Ref Range   Magnesium 1.8 1.7 - 2.4 mg/dL  Hepatic function panel     Status: Abnormal   Collection Time: 07/04/20  2:33 AM  Result Value Ref Range   Total Protein 4.0 (L) 6.5 - 8.1 g/dL   Albumin 1.6 (L) 3.5 - 5.0 g/dL   AST 82 (H) 15 - 41 U/L   ALT 42 0 - 44 U/L   Alkaline Phosphatase 67 38 - 126 U/L   Total Bilirubin 0.8 0.3 - 1.2 mg/dL   Bilirubin, Direct <0.1 0.0 - 0.2 mg/dL   Indirect Bilirubin NOT CALCULATED 0.3 - 0.9 mg/dL    Recent Results (from the past 240 hour(s))  Resp Panel by RT-PCR (Flu A&B, Covid) Nasopharyngeal Swab     Status: None   Collection Time: 06/30/20 12:34 PM   Specimen: Nasopharyngeal Swab; Nasopharyngeal(NP) swabs in vial transport medium  Result Value Ref Range Status   SARS Coronavirus 2 by RT PCR NEGATIVE NEGATIVE Final    Comment: (NOTE) SARS-CoV-2 target nucleic acids are NOT DETECTED.  The SARS-CoV-2 RNA is generally detectable in upper respiratory specimens during the acute phase of infection. The lowest concentration of SARS-CoV-2 viral copies this assay can detect is 138 copies/mL. A negative result does not preclude SARS-Cov-2 infection and should not be used as the sole basis for treatment or other patient management decisions. A negative result may occur with  improper specimen collection/handling, submission of specimen other than nasopharyngeal swab, presence of viral mutation(s) within the areas targeted by this assay, and inadequate number of viral copies(<138 copies/mL). A negative result must be combined with clinical observations, patient history, and epidemiological information. The expected result is Negative.  Fact Sheet for Patients:  EntrepreneurPulse.com.au  Fact Sheet for Healthcare Providers:  IncredibleEmployment.be  This test is no t yet approved or cleared by the [place] FDA and  has been authorized for detection and/or diagnosis of SARS-CoV-2 by FDA under  an Emergency Use Authorization (EUA). This EUA will remain  in effect (meaning this test can be used) for the duration of the COVID-19 declaration under Section 564(b)(1) of the Act, 21 U.S.C.section 360bbb-3(b)(1), unless the authorization is terminated  or revoked sooner.       Influenza A by PCR NEGATIVE NEGATIVE Final   Influenza B by PCR NEGATIVE NEGATIVE Final  Comment: (NOTE) The Xpert Xpress SARS-CoV-2/FLU/RSV plus assay is intended as an aid in the diagnosis of influenza from Nasopharyngeal swab specimens and should not be used as a sole basis for treatment. Nasal washings and aspirates are unacceptable for Xpert Xpress SARS-CoV-2/FLU/RSV testing.  Fact Sheet for Patients: EntrepreneurPulse.com.au  Fact Sheet for Healthcare Providers: IncredibleEmployment.be  This test is not yet approved or cleared by the [place] FDA and has been authorized for detection and/or diagnosis of SARS-CoV-2 by FDA under an Emergency Use Authorization (EUA). This EUA will remain in effect (meaning this test can be used) for the duration of the COVID-19 declaration under Section 564(b)(1) of the Act, 21 U.S.C. section 360bbb-3(b)(1), unless the authorization is terminated or revoked.  Performed at [facility], [place] [address] [place], [place] [address]   C Difficile Quick Screen w PCR reflex     Status: None   Collection Time: 07/02/20  3:00 PM   Specimen: STOOL  Result Value Ref Range Status   C Diff antigen NEGATIVE NEGATIVE Final   C Diff toxin NEGATIVE NEGATIVE Final   C Diff interpretation No C. difficile detected.  Final    Comment: Performed at [facility], [place] [address] [place], [place] [address]  Gastrointestinal Panel by PCR , Stool     Status: Abnormal   Collection Time: 07/02/20  3:00 PM   Specimen: Stool  Result Value Ref Range Status   Campylobacter species NOT DETECTED NOT DETECTED Final   Plesimonas  shigelloides NOT DETECTED NOT DETECTED Final   Salmonella species NOT DETECTED NOT DETECTED Final   Yersinia enterocolitica NOT DETECTED NOT DETECTED Final   Vibrio species NOT DETECTED NOT DETECTED Final   Vibrio cholerae NOT DETECTED NOT DETECTED Final   Enteroaggregative E coli (EAEC) NOT DETECTED NOT DETECTED Final   Enteropathogenic E coli (EPEC) DETECTED (A) NOT DETECTED Final    Comment: RESULT CALLED TO, READ BACK BY AND VERIFIED WITH: JASMINA CVIJETIC AT 1413 07/03/20.PMF    Enterotoxigenic E coli (ETEC) NOT DETECTED NOT DETECTED Final   Shiga like toxin producing E coli (STEC) NOT DETECTED NOT DETECTED Final   Shigella/Enteroinvasive E coli (EIEC) NOT DETECTED NOT DETECTED Final   Cryptosporidium NOT DETECTED NOT DETECTED Final   Cyclospora cayetanensis NOT DETECTED NOT DETECTED Final   Entamoeba histolytica NOT DETECTED NOT DETECTED Final   Giardia lamblia NOT DETECTED NOT DETECTED Final   Adenovirus F40/41 NOT DETECTED NOT DETECTED Final   Astrovirus NOT DETECTED NOT DETECTED Final   Norovirus GI/GII NOT DETECTED NOT DETECTED Final   Rotavirus A NOT DETECTED NOT DETECTED Final   Sapovirus (I, II, IV, and V) NOT DETECTED NOT DETECTED Final    Comment: Performed at [facility], [address], [place], [place] [address]  Surgical pcr screen     Status: None   Collection Time: 07/03/20 11:25 AM   Specimen: Nasal Mucosa; Nasal Swab  Result Value Ref Range Status   MRSA, PCR NEGATIVE NEGATIVE Final   Staphylococcus aureus NEGATIVE NEGATIVE Final    Comment: (NOTE) The Xpert SA Assay (FDA approved for NASAL specimens in patients 27 years of age and older), is one component of a comprehensive surveillance program. It is not intended to diagnose infection nor to guide or monitor treatment. Performed at [facility], [place] [address] [place], [place] [address]      Radiology Studies: VAS [place] UPPER EXT VEIN MAPPING (PRE-OP AVF)  Result Date:  07/04/2020 UPPER EXTREMITY  VEIN MAPPING  Indications: Pre-access. History: ESRD.  Limitations: IV, bandages, skin injury from tape, body habitus Comparison Study: No prior study on file Performing Technologist: Sharion Dove RVS  Examination Guidelines: A complete evaluation includes B-mode imaging, spectral Doppler, color Doppler, and power Doppler as needed of all accessible portions of each vessel. Bilateral testing is considered an integral part of a complete examination. Limited examinations for reoccurring indications may be performed as noted. +-----------------+-------------+----------+--------------+ Right Cephalic   Diameter (cm)Depth (cm)   Findings    +-----------------+-------------+----------+--------------+ Prox upper arm                          not visualized +-----------------+-------------+----------+--------------+ Mid upper arm                           not visualized +-----------------+-------------+----------+--------------+ Dist upper arm                          not visualized +-----------------+-------------+----------+--------------+ Antecubital fossa    0.19        0.33                  +-----------------+-------------+----------+--------------+ Prox forearm         0.16        0.46                  +-----------------+-------------+----------+--------------+ Mid forearm          0.22        0.17                  +-----------------+-------------+----------+--------------+ Wrist                0.13        0.17                  +-----------------+-------------+----------+--------------+ Patient could not tolerate probe touching medial arm. +-----------------+-------------+----------+--------------+ Left Cephalic    Diameter (cm)Depth (cm)   Findings    +-----------------+-------------+----------+--------------+ Prox upper arm       0.33        0.88                  +-----------------+-------------+----------+--------------+ Mid upper arm         0.32        0.70                  +-----------------+-------------+----------+--------------+ Dist upper arm       0.29        0.86                  +-----------------+-------------+----------+--------------+ Antecubital fossa    0.40        0.57                  +-----------------+-------------+----------+--------------+ Prox forearm         0.32        0.44                  +-----------------+-------------+----------+--------------+ Mid forearm          0.28        0.74                  +-----------------+-------------+----------+--------------+ Dist forearm         0.12        0.92                  +-----------------+-------------+----------+--------------+  Wrist                                   not visualized +-----------------+-------------+----------+--------------+ +-----------------+-------------+----------+---------+ Left Basilic     Diameter (cm)Depth (cm)Findings  +-----------------+-------------+----------+---------+ Prox upper arm       0.34        1.64    origin   +-----------------+-------------+----------+---------+ Mid upper arm        0.38        1.19             +-----------------+-------------+----------+---------+ Dist upper arm       0.35        0.96             +-----------------+-------------+----------+---------+ Antecubital fossa    0.29        0.68             +-----------------+-------------+----------+---------+ Prox forearm         0.26        0.42             +-----------------+-------------+----------+---------+ Mid forearm          0.30        0.35             +-----------------+-------------+----------+---------+ Distal forearm       0.16        0.31   branching +-----------------+-------------+----------+---------+ Wrist                0.09        0.17             +-----------------+-------------+----------+---------+ *See table(s) above for measurements and observations.  Diagnosing physician:  Jamelle Haring Electronically signed by Jamelle Haring on 07/04/2020 at 12:35:53 PM.    Final    VAS [place] UPPER EXT VEIN MAPPING (PRE-OP AVF)  Final Result    US RENAL  Final Result    DG Chest [facility] 1 View  Final Result      Scheduled Meds: . atovaquone  1,500 mg Oral Q breakfast  . azithromycin  500 mg Oral Daily  . bictegravir-emtricitabine-tenofovir AF  1 tablet Oral Daily  . heparin  5,000 Units Subcutaneous Q8H  . kidney failure book   Does not apply Once  . multivitamin  1 tablet Oral QHS  . sevelamer carbonate  800 mg Oral TID WC  . sodium bicarbonate  1,300 mg Oral TID   PRN Meds: acetaminophen **OR** acetaminophen, calcium carbonate (dosed in mg elemental calcium), camphor-menthol **AND** hydrOXYzine, dextromethorphan, docusate sodium, feeding supplement (NEPRO CARB STEADY), hydrALAZINE, labetalol, loperamide, menthol-cetylpyridinium, ondansetron **OR** ondansetron (ZOFRAN) IV, sorbitol, zolpidem Continuous Infusions:    LOS: 4 days  Time spent: Greater than 50% of the 35 minute visit was spent in counseling/coordination of care for the patient as laid out in the A&P.   Dwyane Dee, MD [facility] 07/04/2020, 1:41 PM

## 2020-07-04 NOTE — Progress Notes (Signed)
Patient ID: Brooke Baldwin, female   DOB: 10-Aug-1975, 44 y.o.   MRN: [id number] S: Seen after AVG placement and tolerated it well.  No new complaints. O:BP 101/80 (BP Location: Right Arm)   Pulse 88   Temp 98.1 F (36.7 C) (Oral)   Resp 18   Ht 5\' 2"  (1.575 m)   Wt 96.6 kg   LMP 04/02/2015 (Approximate)   SpO2 95%   BMI 38.96 kg/m   Intake/Output Summary (Last 24 hours) at 07/04/2020 1535 Last data filed at 07/04/2020 0916 Gross per 24 hour  Intake 295 ml  Output 5 ml  Net 290 ml   Intake/Output: I/O last 3 completed shifts: In: 440 [P.O.:240; IV Piggyback:200] Out: 250 [Urine:250]  Intake/Output this shift:  Total I/O In: 175 [I.V.:175] Out: 5 [Blood:5] Weight change: -1.27 kg Gen: NAD CVS: RRR, no rub Resp: cta Abd: +BS, soft, Nt/ND Ext: no edema, Left forearm AVG +T/B  Recent Labs  Lab 06/30/20 1133 06/30/20 1459 07/01/20 0808 07/01/20 1511 07/02/20 0306 07/03/20 0402 07/04/20 0233  NA 139  --  140 141 142 145 146*  K 5.1  --  4.1 4.2 3.3* 2.9* 4.0  CL 116*  --  118* 118* 113* 108 112*  CO2 12*  --  10* 13* 16* 25 22  GLUCOSE 109*  --  85 87 88 94 84  BUN 71*  --  63* 68* 68* 59* 55*  CREATININE 10.02*  --  9.24* 9.44* 9.42* 9.16* 9.82*  ALBUMIN  --   --  1.0*  --  1.7* 2.0* 1.6*  1.6*  CALCIUM 7.3* 7.0* 7.0* 6.9* 6.9* 6.6* 7.2*  PHOS 8.3*  --  8.0*  --  7.2* 5.6* 5.9*  AST  --   --   --   --   --   --  82*  ALT  --   --   --   --   --   --  42   Liver Function Tests: Recent Labs  Lab 07/02/20 0306 07/03/20 0402 07/04/20 0233  AST  --   --  82*  ALT  --   --  42  ALKPHOS  --   --  67  BILITOT  --   --  0.8  PROT  --   --  4.0*  ALBUMIN 1.7* 2.0* 1.6*  1.6*   No results for input(s): LIPASE, AMYLASE in the last 168 hours. No results for input(s): AMMONIA in the last 168 hours. CBC: Recent Labs  Lab 06/30/20 1459 06/30/20 1459 07/01/20 0808 07/01/20 0808 07/02/20 0306 07/03/20 0402 07/04/20 0233  WBC 2.0*   < > 3.0*   < > 2.3*  2.2* 2.1*  NEUTROABS  --   --   --   --  1.3* 1.1* 1.1*  HGB 16.0*   < > 11.5*   < > 9.7* 9.7* 11.3*  HCT 50.0*   < > 34.5*   < > 28.3* 28.3* 33.8*  MCV 88.8  --  86.7  --  84.0 84.5 85.6  PLT 123*   < > 158   < > 146* 137* 124*   < > = values in this interval not displayed.   Cardiac Enzymes: No results for input(s): CKTOTAL, CKMB, CKMBINDEX, TROPONINI in the last 168 hours. CBG: No results for input(s): GLUCAP in the last 168 hours.  Iron Studies: No results for input(s): IRON, TIBC, TRANSFERRIN, FERRITIN in the last 72 hours. Studies/Results: VAS [place] UPPER EXT VEIN MAPPING (  PRE-OP AVF)  Result Date: 07/04/2020 UPPER EXTREMITY VEIN MAPPING  Indications: Pre-access. History: ESRD.  Limitations: IV, bandages, skin injury from tape, body habitus Comparison Study: No prior study on file Performing Technologist: Sharion Dove RVS  Examination Guidelines: A complete evaluation includes B-mode imaging, spectral Doppler, color Doppler, and power Doppler as needed of all accessible portions of each vessel. Bilateral testing is considered an integral part of a complete examination. Limited examinations for reoccurring indications may be performed as noted. +-----------------+-------------+----------+--------------+ Right Cephalic   Diameter (cm)Depth (cm)   Findings    +-----------------+-------------+----------+--------------+ Prox upper arm                          not visualized +-----------------+-------------+----------+--------------+ Mid upper arm                           not visualized +-----------------+-------------+----------+--------------+ Dist upper arm                          not visualized +-----------------+-------------+----------+--------------+ Antecubital fossa    0.19        0.33                  +-----------------+-------------+----------+--------------+ Prox forearm         0.16        0.46                   +-----------------+-------------+----------+--------------+ Mid forearm          0.22        0.17                  +-----------------+-------------+----------+--------------+ Wrist                0.13        0.17                  +-----------------+-------------+----------+--------------+ Patient could not tolerate probe touching medial arm. +-----------------+-------------+----------+--------------+ Left Cephalic    Diameter (cm)Depth (cm)   Findings    +-----------------+-------------+----------+--------------+ Prox upper arm       0.33        0.88                  +-----------------+-------------+----------+--------------+ Mid upper arm        0.32        0.70                  +-----------------+-------------+----------+--------------+ Dist upper arm       0.29        0.86                  +-----------------+-------------+----------+--------------+ Antecubital fossa    0.40        0.57                  +-----------------+-------------+----------+--------------+ Prox forearm         0.32        0.44                  +-----------------+-------------+----------+--------------+ Mid forearm          0.28        0.74                  +-----------------+-------------+----------+--------------+ Dist forearm         0.12  0.92                  +-----------------+-------------+----------+--------------+ Wrist                                   not visualized +-----------------+-------------+----------+--------------+ +-----------------+-------------+----------+---------+ Left Basilic     Diameter (cm)Depth (cm)Findings  +-----------------+-------------+----------+---------+ Prox upper arm       0.34        1.64    origin   +-----------------+-------------+----------+---------+ Mid upper arm        0.38        1.19             +-----------------+-------------+----------+---------+ Dist upper arm       0.35        0.96              +-----------------+-------------+----------+---------+ Antecubital fossa    0.29        0.68             +-----------------+-------------+----------+---------+ Prox forearm         0.26        0.42             +-----------------+-------------+----------+---------+ Mid forearm          0.30        0.35             +-----------------+-------------+----------+---------+ Distal forearm       0.16        0.31   branching +-----------------+-------------+----------+---------+ Wrist                0.09        0.17             +-----------------+-------------+----------+---------+ *See table(s) above for measurements and observations.  Diagnosing physician: Jamelle Haring Electronically signed by Jamelle Haring on 07/04/2020 at 12:35:53 PM.    Final    . atovaquone  1,500 mg Oral Q breakfast  . azithromycin  500 mg Oral Daily  . bictegravir-emtricitabine-tenofovir AF  1 tablet Oral Daily  . heparin  5,000 Units Subcutaneous Q8H  . kidney failure book   Does not apply Once  . multivitamin  1 tablet Oral QHS  . sevelamer carbonate  800 mg Oral TID WC  . sodium bicarbonate  1,300 mg Oral TID    BMET    Component Value Date/Time   NA 146 (H) 07/04/2020 0233   NA 139 12/09/2019 0954   K 4.0 07/04/2020 0233   CL 112 (H) 07/04/2020 0233   CO2 22 07/04/2020 0233   GLUCOSE 84 07/04/2020 0233   BUN 55 (H) 07/04/2020 0233   BUN 21 12/15/2019 0848   CREATININE 9.82 (H) 07/04/2020 0233   CALCIUM 7.2 (L) 07/04/2020 0233   CALCIUM 7.0 (L) 06/30/2020 1459   GFRNONAA 5 (L) 07/04/2020 0233   GFRAA 33 (L) 12/15/2019 0848   CBC    Component Value Date/Time   WBC 2.1 (L) 07/04/2020 0233   RBC 3.95 07/04/2020 0233   HGB 11.3 (L) 07/04/2020 0233   HGB 14.1 12/09/2019 0954   HCT 33.8 (L) 07/04/2020 0233   HCT 41.3 12/09/2019 0954   PLT 124 (L) 07/04/2020 0233   MCV 85.6 07/04/2020 0233   MCV 86 12/09/2019 0954   MCH 28.6 07/04/2020 0233   MCHC 33.4 07/04/2020 0233   RDW 15.0  07/04/2020 0233   RDW 13.7 12/09/2019 0954   LYMPHSABS  0.6 (L) 07/04/2020 0233   LYMPHSABS 1.1 12/09/2019 0954   MONOABS 0.2 07/04/2020 0233   EOSABS 0.2 07/04/2020 0233   EOSABS 0.2 12/09/2019 0954   BASOSABS 0.0 07/04/2020 0233   BASOSABS 0.0 12/09/2019 0954     Assessment/Plan:  1. 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  S/p acuseal LAVG and can be used in the next 24-48 hours. 1. No emergent need for dialysis so will try to wait at least 48 hours before cannulating the Acuseal AVG unless she develops an emergent indication for dialysis.  2. HIV/AIDS- has not been well controlled and not on ART for over a year.  Will start [place].  ID consulted and now managing her medications. 3.  Metabolic acidosis - due to CKD, resolved with oral bicarb 4. HTN- stable 5. Enteropathogenic E. Coli- on 3 day course of azithromycin per primary svc 6. Pancytopenia- per ID. 7. SHPTH- on binders and will follow. 8. Vascular access- s/p Left forearm Acuseal AVG placed 07/04/20.  Can be used in the next 24 hours, 17 gauge needles, BFR <250 for first 2 post-operative weeks, and low dose heparin should be used.  10-15 minutes of pressure after decannulating.  Donetta Potts, MD [employer] [phone]

## 2020-07-04 NOTE — Significant Event (Signed)
Rapid Response Event Note   Reason for Call :  Asked to evaluate newly placed L AVG site. Per RN, pt can't feel or move hand, it is cool to touch and has a weak pulse.  Initial Focused Assessment:  Pt laying in bed in no distress, alert and oriented. She is c/o pain in her L arm/shoulder/neck and says she can't move or feel her L hand. Her arm is very swollen, cool to touch(R hand is cool as well but warmer than L hand). L hand cap refill is >3 secs. Unable to palpate radial pulse, however, can doppler pulse. During exam, pt's L hand began to move spontaneously however she says she can't feel it moving.  Interventions:  Keep NPO  Plan of Care:  Keep NPO. Continue to monitor. Call MD/RRT if symptoms worsen.   Event Summary:   MD Notified: Dr. Oneida Alar notified at 2124 Call Time: 2055 Arrival Time: 2106 End Time: 2126  Dillard Essex, RN

## 2020-07-04 NOTE — Progress Notes (Signed)
[facility]  Date of Admission:  06/30/2020           Current antibiotics: Day 3 atovaquone Day 2 Biktarvy  Reason for visit: Follow up on HIV management  ASSESSMENT:    # HIV/AIDS # Acute on CKD4 --> ESRD # FSGS, HIVAN   PLAN:    -- continue Biktarvy.  Will ensure 30-day supply prior to DC and clinic follow up with me on 12/22 at 1:45pm -- continue Atovaquone -- monitor closely for IRIS -- will sign off for now, please call as needed  MEDICATIONS:    Scheduled Meds: . atovaquone  1,500 mg Oral Q breakfast  . azithromycin  500 mg Oral Daily  . bictegravir-emtricitabine-tenofovir AF  1 tablet Oral Daily  . heparin  5,000 Units Subcutaneous Q8H  . kidney failure book   Does not apply Once  . multivitamin  1 tablet Oral QHS  . sevelamer carbonate  800 mg Oral TID WC  . sodium bicarbonate  1,300 mg Oral TID    Continuous Infusions:   PRN Meds: acetaminophen **OR** acetaminophen, calcium carbonate (dosed in mg elemental calcium), camphor-menthol **AND** hydrOXYzine, dextromethorphan, docusate sodium, feeding supplement (NEPRO CARB STEADY), hydrALAZINE, labetalol, loperamide, menthol-cetylpyridinium, ondansetron **OR** ondansetron (ZOFRAN) IV, sorbitol, zolpidem  SUBJECTIVE:   Seen this afternoon after her surgery.  Reports left arm is still numb.  Family member/friend at bedside.  She tolerated Biktarvy yesterday.  Otherwise no new complaints.  She wants to follow up in our clinic   Review of Systems: As noted above.  All other systems reviewed and are negative.   OBJECTIVE:   Allergies  Allergen Reactions  . Baclofen Other (See Comments)    Caused intolerable dizziness   . Gabapentin Other (See Comments)    Caused intolerable dizziness  . Penicillins Other (See Comments)    Unknown childhood reaction     Blood pressure 101/80, pulse 88, temperature 98.1 F (36.7 C), temperature source Oral, resp. rate 18, height 5\' 2"   (1.575 m), weight 96.6 kg, last menstrual period 04/02/2015, SpO2 95 %. Body mass index is 38.96 kg/m.  Physical Exam Constitutional:      General: She is not in acute distress.    Appearance: Normal appearance.  HENT:     Head: Normocephalic and atraumatic.  Eyes:     Extraocular Movements: Extraocular movements intact.     Conjunctiva/sclera: Conjunctivae normal.  Pulmonary:     Effort: Pulmonary effort is normal. No respiratory distress.  Neurological:     General: No focal deficit present.     Mental Status: She is alert and oriented to person, place, and time.  Psychiatric:        Mood and Affect: Mood normal.        Behavior: Behavior normal.       Lab Results & Microbiology Lab Results  Component Value Date   WBC 2.1 (L) 07/04/2020   HGB 11.3 (L) 07/04/2020   HCT 33.8 (L) 07/04/2020   MCV 85.6 07/04/2020   PLT 124 (L) 07/04/2020    Lab Results  Component Value Date   NA 146 (H) 07/04/2020   K 4.0 07/04/2020   CO2 22 07/04/2020   GLUCOSE 84 07/04/2020   BUN 55 (H) 07/04/2020   CREATININE 9.82 (H) 07/04/2020   CALCIUM 7.2 (L) 07/04/2020   GFRNONAA 5 (L) 07/04/2020   GFRAA 33 (L) 12/15/2019    Lab Results  Component Value Date   ALT 42  07/04/2020   AST 82 (H) 07/04/2020   ALKPHOS 67 07/04/2020   BILITOT 0.8 07/04/2020     I have reviewed the micro and lab results in Epic.  Imaging VAS [place] UPPER EXT VEIN MAPPING (PRE-OP AVF)  Result Date: 07/04/2020 UPPER EXTREMITY VEIN MAPPING  Indications: Pre-access. History: ESRD.  Limitations: IV, bandages, skin injury from tape, body habitus Comparison Study: No prior study on file Performing Technologist: Sharion Dove RVS  Examination Guidelines: A complete evaluation includes B-mode imaging, spectral Doppler, color Doppler, and power Doppler as needed of all accessible portions of each vessel. Bilateral testing is considered an integral part of a complete examination. Limited examinations for reoccurring  indications may be performed as noted. +-----------------+-------------+----------+--------------+ Right Cephalic   Diameter (cm)Depth (cm)   Findings    +-----------------+-------------+----------+--------------+ Prox upper arm                          not visualized +-----------------+-------------+----------+--------------+ Mid upper arm                           not visualized +-----------------+-------------+----------+--------------+ Dist upper arm                          not visualized +-----------------+-------------+----------+--------------+ Antecubital fossa    0.19        0.33                  +-----------------+-------------+----------+--------------+ Prox forearm         0.16        0.46                  +-----------------+-------------+----------+--------------+ Mid forearm          0.22        0.17                  +-----------------+-------------+----------+--------------+ Wrist                0.13        0.17                  +-----------------+-------------+----------+--------------+ Patient could not tolerate probe touching medial arm. +-----------------+-------------+----------+--------------+ Left Cephalic    Diameter (cm)Depth (cm)   Findings    +-----------------+-------------+----------+--------------+ Prox upper arm       0.33        0.88                  +-----------------+-------------+----------+--------------+ Mid upper arm        0.32        0.70                  +-----------------+-------------+----------+--------------+ Dist upper arm       0.29        0.86                  +-----------------+-------------+----------+--------------+ Antecubital fossa    0.40        0.57                  +-----------------+-------------+----------+--------------+ Prox forearm         0.32        0.44                  +-----------------+-------------+----------+--------------+ Mid forearm          0.28  0.74                   +-----------------+-------------+----------+--------------+ Dist forearm         0.12        0.92                  +-----------------+-------------+----------+--------------+ Wrist                                   not visualized +-----------------+-------------+----------+--------------+ +-----------------+-------------+----------+---------+ Left Basilic     Diameter (cm)Depth (cm)Findings  +-----------------+-------------+----------+---------+ Prox upper arm       0.34        1.64    origin   +-----------------+-------------+----------+---------+ Mid upper arm        0.38        1.19             +-----------------+-------------+----------+---------+ Dist upper arm       0.35        0.96             +-----------------+-------------+----------+---------+ Antecubital fossa    0.29        0.68             +-----------------+-------------+----------+---------+ Prox forearm         0.26        0.42             +-----------------+-------------+----------+---------+ Mid forearm          0.30        0.35             +-----------------+-------------+----------+---------+ Distal forearm       0.16        0.31   branching +-----------------+-------------+----------+---------+ Wrist                0.09        0.17             +-----------------+-------------+----------+---------+ *See table(s) above for measurements and observations.  Diagnosing physician: Jamelle Haring Electronically signed by Jamelle Haring on 07/04/2020 at 12:35:53 PM.    Final        [facility] [place] [phone] pager 07/04/2020, 4:16 PM

## 2020-07-04 NOTE — Anesthesia Procedure Notes (Addendum)
Anesthesia Regional Block: Supraclavicular block   Pre-Anesthetic Checklist: ,, timeout performed, Correct Patient, Correct Site, Correct Laterality, Correct Procedure, Correct Position, site marked, Risks and benefits discussed,  Surgical consent,  Pre-op evaluation,  At surgeon's request and post-op pain management  Laterality: Left  Prep: chloraprep       Needles:  Injection technique: Single-shot  Needle Type: Echogenic Needle     Needle Length: 5cm  Needle Gauge: 21     Additional Needles:   Procedures:,,,, ultrasound used (permanent image in chart),,,,  Narrative:  Start time: 07/04/2020 7:03 AM End time: 07/04/2020 7:10 AM Injection made incrementally with aspirations every 5 mL.  Performed by: Personally  Anesthesiologist: Barnet Glasgow, MD

## 2020-07-04 NOTE — Progress Notes (Signed)
   ASSESSMENT & PLAN:  Brooke Baldwin is a 44 y.o. 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   Plan early access forearm loop AVG to avoid catheterization in severely immunocompromised pt.  SUBJECTIVE:  No complaints.   OBJECTIVE:  BP (!) 146/95 (BP Location: Right Arm)   Pulse 96   Temp 99.4 F (37.4 C) (Oral)   Resp 17   Ht 5\' 2"  (1.575 m)   Wt 96.6 kg   LMP 04/02/2015 (Approximate)   SpO2 96%   BMI 38.96 kg/m   Intake/Output Summary (Last 24 hours) at 07/04/2020 0728 Last data filed at 07/03/2020 1838 Gross per 24 hour  Intake 440 ml  Output 250 ml  Net 190 ml   Constitutional: well appearing. no acute distress. Cardiac: RRR. Vascular: 2+ L radial pulse.   CBC Latest Ref Rng & Units 07/04/2020 07/03/2020 07/02/2020  WBC 4.0 - 10.5 K/uL 2.1(L) 2.2(L) 2.3(L)  Hemoglobin 12.0 - 15.0 g/dL 11.3(L) 9.7(L) 9.7(L)  Hematocrit 36 - 46 % 33.8(L) 28.3(L) 28.3(L)  Platelets 150 - 400 K/uL 124(L) 137(L) 146(L)     CMP Latest Ref Rng & Units 07/04/2020 07/03/2020 07/02/2020  Glucose 70 - 99 mg/dL 84 94 88  BUN 6 - 20 mg/dL 55(H) 59(H) 68(H)  Creatinine 0.44 - 1.00 mg/dL 9.82(H) 9.16(H) 9.42(H)  Sodium 135 - 145 mmol/L 146(H) 145 142  Potassium 3.5 - 5.1 mmol/L 4.0 2.9(L) 3.3(L)  Chloride 98 - 111 mmol/L 112(H) 108 113(H)  CO2 22 - 32 mmol/L 22 25 16(L)  Calcium 8.9 - 10.3 mg/dL 7.2(L) 6.6(L) 6.9(L)  Total Protein 6.5 - 8.1 g/dL 4.0(L) - -  Total Bilirubin 0.3 - 1.2 mg/dL 0.8 - -  Alkaline Phos 38 - 126 U/L 67 - -  AST 15 - 41 U/L 82(H) - -  ALT 0 - 44 U/L 42 - -    Estimated Creatinine Clearance: 7.8 mL/min (A) (by C-G formula based on SCr of 9.82 mg/dL (H)).  Brooke Baldwin. Brooke Breed, MD [facility] Phone Number: [phone] 07/04/2020 7:28 AM

## 2020-07-04 NOTE — Op Note (Addendum)
DATE OF SERVICE: 07/04/2020  PATIENT:  Brooke Baldwin  44 y.o. female  PRE-OPERATIVE DIAGNOSIS: CKD 4 approaching end-stage renal disease  POST-OPERATIVE DIAGNOSIS: Same  PROCEDURE:   Left upper extremity forearm loop brachial - cephalic AVG (6-2XB Acuseal)  SURGEON:  Surgeon(s) and Role:    * Cherre Robins, MD - Primary  ASSISTANT: Gerri Lins, PA-C  An assistant was required to facilitate exposure and expedite the case.  ANESTHESIA:   regional and IV sedation  EBL: min  BLOOD ADMINISTERED:none  DRAINS: none   LOCAL MEDICATIONS USED:  NONE  SPECIMEN:  none  COUNTS: confirmed correct.  TOURNIQUET:  * No tourniquets in log *  PATIENT DISPOSITION:  PACU - hemodynamically stable.   Delay start of Pharmacological VTE agent (>24hrs) due to surgical blood loss or risk of bleeding: no  INDICATION FOR PROCEDURE: Brooke Baldwin is a 44 y.o. female with rapidly deteriorating CKD in need of HD access. She has AIDS with a CD4 count <35. After careful discussion of risks, benefits, and alternatives the patient was offered left upper extremity early access AVG to avoid dialysis catheter. We specifically discussed risk of steal syndrome and risk of infection. The patient understood and wished to proceed.  OPERATIVE FINDINGS:  Brachial bifurcation diminutive but acceptable for HD access. Brachial artery only suitable donor vessel at 1mm. Radial and ulnar arteries <9mm.  Cephalic vein exposed in typical location in antecubitum. The antecubital vein joined the cephalic vein mid incision. I anastomosed the graft just distal to this confluence to encourage arterialization of the cephalic vein for future autologous access. No vein branches were sacrificed.  Good doppler thrill in the cephalic vein on completion. Biphasic radial signal which augmented with occlusion of the graft. Monitor for steal symptoms.  Please note that the graft can be used as early as 11/30 for HD.  If cannulating the graft in the first two postoperative weeks, 17GA needles should be used. A lower flow rate should be used (<250cc/h). Low dose heparin should be used. 10-15 minutes of pressure after decannulating should be used.      DESCRIPTION OF PROCEDURE: After identification of the patient in the pre-operative holding area, the patient was transferred to the operating room. The patient was positioned supine on the operating room table. Anesthesia was induced. The left arm was prepped and draped in standard fashion. A surgical pause was performed confirming correct patient, procedure, and operative location.  Using intraoperative ultrasound the course of the left upper extremity vasculature was mapped.  I ensured there is no high bifurcation of the brachial artery.  The cephalic vein appeared appropriate as a target vein.  A transverse incision just distal to the antecubital fossa crease was made.  Incision was carried down through subcutaneous tissue with Bovie electrocautery until the cephalic vein was identified.  This was skeletonized.  The aponeurosis of the biceps was divided and the brachial artery identified and usual position.  We encircled the brachial artery, radial artery, ulnar artery with Silastic Vesseloops.  This anatomy was better defined in the antecubital fossa and a generous antecubital branch was noted to be entering the cephalic vein.  Silastic Vesseloops were placed around the antecubital vein as well as the proximal and distal cephalic vein.  Using a sheathed tunneling device a 4-7 mm AcuSeal Gore-Tex graft was tunneled subcutaneously in the forearm in a loop configuration.  Counter incision was used in the mid volar forearm. the arterial end is medial in the arm and  the venous end is lateral in the arm.  The graft is positioned so that flow is counterclockwise.  See picture above.  Patient was heparinized with 5000 units of IV heparin.  ACT monitoring was not used.   The brachial, radial, ulnar arteries were clamped.  An anterior arteriotomy was made with 11 blade and extended with small Potts scissors.  The 4 mm end of the graft was spatulated and anastomosed end to side to the brachial artery using continuous running suture of 6-0 Gore-Tex suture material.  After completion, the clamps were released and hemostasis achieved at the anastomosis. Good flow was noted through the graft.  The graft was clamped with a Fogarty hydrogrip clamp.  The 7 mm end of the graft was then spatulated.  An anterior venotomy in the cephalic vein was made with 11 blade.  This was extended with Potts scissors.  The 7 mm into the graft was then asked most end-to-side to the cephalic vein using continuous running suture of 6-0 Gore-Tex suture.  Medially prior to completion anastomosis clamps were released and the anastomosis flushed and de-aired.  The anastomosis was then completed.  Upon completion, a Doppler bruit could be heard in cephalic vein central to the anastomosis.  There is a biphasic Doppler signal over the radial artery which augmented with occlusion of the graft.  The wounds were copiously irrigated.  The wounds were closed in layers using 2 layers of 3-0 Vicryl at the antecubital fossa followed by 4-0 Monocryl.  Counterincision was closed with 3-0 Vicryl and 4-0 Monocryl.  Bandages were applied.  The patient was allowed to emerge from anesthesia.  Upon completion of the case instrument and sharps counts were confirmed correct. The patient was transferred to the PACU in good condition. I was present for all portions of the procedure.  [name]. Stanford Breed, MD [facility] Phone Number: [phone] 07/04/2020 10:12 AM

## 2020-07-04 NOTE — Progress Notes (Signed)
During assessment, pt stated that she can't feel anything on her left hand/ s/p AVG placed at forearm. Radial pulse barely felt. Cool to touch and swollen. Also pt has shoulder pain. Moreover pt can't move her left hand/fingers too.   Rapid Response RN  called for further  assessment. See note.  Spoke with Dr Oneida Alar, ordered  NPO for further Evaluation AT AM.   2200 reassessment done, pt able to move fingers, able to feel the pinch on fingertips only but none above it yet. Will monitor. Positive radial  Pulse on doppler. Marked site.

## 2020-07-05 ENCOUNTER — Other Ambulatory Visit (HOSPITAL_COMMUNITY): Payer: Self-pay | Admitting: Internal Medicine

## 2020-07-05 ENCOUNTER — Encounter (HOSPITAL_COMMUNITY): Payer: Self-pay | Admitting: Internal Medicine

## 2020-07-05 DIAGNOSIS — N185 Chronic kidney disease, stage 5: Secondary | ICD-10-CM | POA: Diagnosis not present

## 2020-07-05 DIAGNOSIS — N179 Acute kidney failure, unspecified: Secondary | ICD-10-CM | POA: Diagnosis not present

## 2020-07-05 LAB — CBC WITH DIFFERENTIAL/PLATELET
Abs Immature Granulocytes: 0.01 10*3/uL (ref 0.00–0.07)
Basophils Absolute: 0 10*3/uL (ref 0.0–0.1)
Basophils Relative: 0 %
Eosinophils Absolute: 0.3 10*3/uL (ref 0.0–0.5)
Eosinophils Relative: 9 %
HCT: 32.7 % — ABNORMAL LOW (ref 36.0–46.0)
Hemoglobin: 10.5 g/dL — ABNORMAL LOW (ref 12.0–15.0)
Immature Granulocytes: 0 %
Lymphocytes Relative: 29 %
Lymphs Abs: 0.8 10*3/uL (ref 0.7–4.0)
MCH: 28.5 pg (ref 26.0–34.0)
MCHC: 32.1 g/dL (ref 30.0–36.0)
MCV: 88.9 fL (ref 80.0–100.0)
Monocytes Absolute: 0.2 10*3/uL (ref 0.1–1.0)
Monocytes Relative: 8 %
Neutro Abs: 1.5 10*3/uL — ABNORMAL LOW (ref 1.7–7.7)
Neutrophils Relative %: 54 %
Platelets: 104 10*3/uL — ABNORMAL LOW (ref 150–400)
RBC: 3.68 MIL/uL — ABNORMAL LOW (ref 3.87–5.11)
RDW: 15.4 % (ref 11.5–15.5)
WBC: 2.9 10*3/uL — ABNORMAL LOW (ref 4.0–10.5)
nRBC: 0 % (ref 0.0–0.2)

## 2020-07-05 LAB — RENAL FUNCTION PANEL
Albumin: 1.4 g/dL — ABNORMAL LOW (ref 3.5–5.0)
Albumin: 1.6 g/dL — ABNORMAL LOW (ref 3.5–5.0)
Anion gap: 14 (ref 5–15)
Anion gap: 9 (ref 5–15)
BUN: 60 mg/dL — ABNORMAL HIGH (ref 6–20)
BUN: 59 mg/dL — ABNORMAL HIGH (ref 6–20)
CO2: 21 mmol/L — ABNORMAL LOW (ref 22–32)
CO2: 23 mmol/L (ref 22–32)
Calcium: 7.5 mg/dL — ABNORMAL LOW (ref 8.9–10.3)
Calcium: 7.4 mg/dL — ABNORMAL LOW (ref 8.9–10.3)
Chloride: 107 mmol/L (ref 98–111)
Chloride: 106 mmol/L (ref 98–111)
Creatinine, Ser: 10.51 mg/dL — ABNORMAL HIGH (ref 0.44–1.00)
Creatinine, Ser: 10.48 mg/dL — ABNORMAL HIGH (ref 0.44–1.00)
GFR, Estimated: 4 mL/min — ABNORMAL LOW (ref >60)
GFR, Estimated: 4 mL/min — ABNORMAL LOW (ref >60)
Glucose, Bld: 86 mg/dL (ref 70–99)
Glucose, Bld: 119 mg/dL — ABNORMAL HIGH (ref 70–99)
Phosphorus: 6.2 mg/dL — ABNORMAL HIGH (ref 2.5–4.6)
Phosphorus: 5.8 mg/dL — ABNORMAL HIGH (ref 2.5–4.6)
Potassium: 4.2 mmol/L (ref 3.5–5.1)
Potassium: 4.5 mmol/L (ref 3.5–5.1)
Sodium: 142 mmol/L (ref 135–145)
Sodium: 138 mmol/L (ref 135–145)

## 2020-07-05 LAB — MAGNESIUM: Magnesium: 1.7 mg/dL (ref 1.7–2.4)

## 2020-07-05 MED ORDER — HYDROMORPHONE HCL 1 MG/ML IJ SOLN
1.0000 mg | Freq: Once | INTRAMUSCULAR | Status: AC
  Administered 2020-07-05: 1 mg via INTRAVENOUS
  Filled 2020-07-05: qty 1

## 2020-07-05 MED ORDER — TRAMADOL HCL 50 MG PO TABS
50.0000 mg | ORAL_TABLET | Freq: Four times a day (QID) | ORAL | Status: AC | PRN
  Administered 2020-07-05 (×2): 50 mg via ORAL
  Filled 2020-07-05 (×2): qty 1

## 2020-07-05 MED ORDER — BIKTARVY 50-200-25 MG PO TABS: 1.0000 | ORAL_TABLET | Freq: Every day | ORAL | 5 refills | Status: DC

## 2020-07-05 MED ORDER — INFLUENZA VAC SPLIT QUAD 0.5 ML IM SUSY
0.5000 mL | PREFILLED_SYRINGE | INTRAMUSCULAR | Status: AC
  Administered 2020-07-06: 0.5 mL via INTRAMUSCULAR
  Filled 2020-07-05: qty 0.5

## 2020-07-05 MED ORDER — MORPHINE SULFATE (PF) 4 MG/ML IV SOLN
4.0000 mg | INTRAVENOUS | Status: DC | PRN
  Administered 2020-07-05 (×2): 4 mg via INTRAVENOUS
  Filled 2020-07-05 (×2): qty 1

## 2020-07-05 MED ORDER — OXYCODONE HCL 5 MG PO TABS
5.0000 mg | ORAL_TABLET | ORAL | Status: DC | PRN
  Administered 2020-07-05: 5 mg via ORAL
  Administered 2020-07-05 – 2020-07-06 (×2): 10 mg via ORAL
  Administered 2020-07-06 (×2): 5 mg via ORAL
  Administered 2020-07-06 – 2020-07-12 (×4): 10 mg via ORAL
  Filled 2020-07-05 (×2): qty 1
  Filled 2020-07-05: qty 2
  Filled 2020-07-05: qty 1
  Filled 2020-07-05 (×3): qty 2
  Filled 2020-07-05: qty 1
  Filled 2020-07-05 (×2): qty 2

## 2020-07-05 MED ORDER — ACETAMINOPHEN 325 MG PO TABS
650.0000 mg | ORAL_TABLET | Freq: Four times a day (QID) | ORAL | Status: DC
  Administered 2020-07-05 – 2020-07-12 (×26): 650 mg via ORAL
  Filled 2020-07-05 (×27): qty 2

## 2020-07-05 MED FILL — BIKTARVY 50-200-25 MG TABS: 50-200-25 | 30 days supply | Qty: 30 | Fill #0

## 2020-07-05 NOTE — Progress Notes (Signed)
  Progress Note    07/05/2020 6:23 PM 1 Day Post-Op  Subjective:  States left arm is somewhat better however she did just receive pain medication per RN   Vitals:   07/05/20 1002 07/05/20 1751  BP: (!) 128/91 (!) 159/99  Pulse: 94 (!) 102  Resp: 18 18  Temp: 98 F (36.7 C) 97.8 F (36.6 C)  SpO2: 100% 97%   Physical Exam: Incisions:  Left proximal forearm incision with bloody oozing otherwise incisions are intact Extremities:  Left fingers cool, hand warm, left forearm very tender to palpation and mildly edematous. Doppler radial signal in left hand. Motor and sensation intact  Assessment/Plan:  44 y.o. female is s/p LUE early access forearm loop AV graft 1 Day Post-Op. Presently stable pain controlled with po pain medication. Left hand with good doppler radial signal. Sensation and motor remains intact. Will continue to monitor overnight   Karoline Caldwell, PA-C [facility] [phone] 07/05/2020 6:23 PM

## 2020-07-05 NOTE — Progress Notes (Signed)
   ASSESSMENT & PLAN:  Brooke Baldwin is a 44 y.o. 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   S/P LUE early access forearm loop AVG. Coolness and mild sensory change of the left hand. No weakness or ischemic rest pain in the left hand. Monitor. Encourage hand exercise with an exercise ball. Do not elevate the arm.   SUBJECTIVE:  No complaints.   OBJECTIVE:  BP (!) 141/95 (BP Location: Right Arm)   Pulse 80   Temp 97.8 F (36.6 C) (Oral)   Resp 17   Ht 5\' 2"  (1.575 m)   Wt 97.3 kg   LMP 04/02/2015 (Approximate)   SpO2 95%   BMI 39.25 kg/m   Intake/Output Summary (Last 24 hours) at 07/05/2020 0756 Last data filed at 07/04/2020 1840 Gross per 24 hour  Intake 640 ml  Output 5 ml  Net 635 ml   Constitutional: well appearing. no acute distress. Cardiac: RRR. Vascular: 2+ L radial pulse.   CBC Latest Ref Rng & Units 07/05/2020 07/04/2020 07/03/2020  WBC 4.0 - 10.5 K/uL 2.9(L) 2.1(L) 2.2(L)  Hemoglobin 12.0 - 15.0 g/dL 10.5(L) 11.3(L) 9.7(L)  Hematocrit 36 - 46 % 32.7(L) 33.8(L) 28.3(L)  Platelets 150 - 400 K/uL 104(L) 124(L) 137(L)     CMP Latest Ref Rng & Units 07/05/2020 07/04/2020 07/03/2020  Glucose 70 - 99 mg/dL 86 84 94  BUN 6 - 20 mg/dL 60(H) 55(H) 59(H)  Creatinine 0.44 - 1.00 mg/dL 10.51(H) 9.82(H) 9.16(H)  Sodium 135 - 145 mmol/L 142 146(H) 145  Potassium 3.5 - 5.1 mmol/L 4.2 4.0 2.9(L)  Chloride 98 - 111 mmol/L 107 112(H) 108  CO2 22 - 32 mmol/L 21(L) 22 25  Calcium 8.9 - 10.3 mg/dL 7.5(L) 7.2(L) 6.6(L)  Total Protein 6.5 - 8.1 g/dL - 4.0(L) -  Total Bilirubin 0.3 - 1.2 mg/dL - 0.8 -  Alkaline Phos 38 - 126 U/L - 67 -  AST 15 - 41 U/L - 82(H) -  ALT 0 - 44 U/L - 42 -    Estimated Creatinine Clearance: 7.4 mL/min (A) (by C-G formula based on SCr of 10.51 mg/dL (H)).  Brooke Baldwin. Stanford Breed, MD [facility] Phone Number: [phone] 07/05/2020 7:56  AM

## 2020-07-05 NOTE — Progress Notes (Signed)
2330 pt started to feel pain on left forearm. Tylenol given.  0030 Pt on sever pain, unrelieved by tylenol, paged DR Chotiner. Tramadol ordered, was given.  0330 pt called due to pain. Page DR Tonie Griffith.Dilaudid once ordered.

## 2020-07-05 NOTE — Progress Notes (Signed)
PROGRESS NOTE  Brooke Baldwin  DOB: 07/01/1976  PCP: Patient, No Pcp Per ERD:[id number]  DOA: 06/30/2020  LOS: 5 days   Chief Complaint  Patient presents with  . Abnormal Lab    Brief narrative: Brooke Baldwin is a 44 yo female with PMH CKD (progressive decline, currently stage V), HIV (from [place], no prior records), HTN, trigeminal neuralgia. On 11/25, patient presented to the ED with worsening renal function. She has been followed closely outpatient with nephrology and found to have deteriorating renal function. She has undergone work-up in the past including renal biopsy.  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   She was admitted for fluids and close monitoring in case of need for starting on dialysis inpatient.  She underwent repeat HIV labs outpatient with nephrology, however records unable to be viewed in epic.  ID clinic has also not restarted on treatment due to awaiting records from [place].  Labs or again been ordered during this hospitalization to further confirm and guide treatment.  She states that she has been living here for approximately 2 years and has been out of treatment meds for approximately 1 year.  Previously was on Tivicay and another.  Per nephro notes, Cd4 was 5 on recent office check.   HIV labs returned and were consistent with confirming diagnosis as well as significantly low CD4 count (less than 35).  She was evaluated by infectious disease and started on Biktarvy.  Atovaquone was started for PJP prophylaxis.  She was started on IV fluids and a bicarb drip on admission. Renal response/improvement was minimal. Due to her rapid progression of renal decline, nephrology recommended patient undergo VVS consultation with plans for initiation of HD in the next several days.  She underwent placement of left forearm Gore-Tex AV graft on 07/04/2020.  Subjective: Patient was seen and examined this morning. Complain of pain at  the site of AV graft.  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 -Minimal response to IV fluid. -VVS consulted; patient underwent left AV graft placement on 07/04/2020 -Patient understands and is amenable to dialysis  Elevated blood pressure  -Intermittently elevated blood pressure with no history of hypertension.  -Continue to monitor.   -For now, use PRN labetalol or hydralazine  HIV (human immunodeficiency virus infection) ([place]) - there has been difficulty confirming and obtaining CD4 count and VL outpatient - last CD4 is 5 per nephrology notes from check in office outpatient; unable to view records and ID clinic has not received records from [place] consult appreciated. 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  GI pathogen panel noted enteropathogenic E. coli.   -3-day course of azithromycin because of immunocompromised status. -PRN imodium  Neutropenia  -Nontoxic-appearing and low suspicion for infection at this time.  Possibly due to immunosuppression from HIV and/or GI illness -Continue monitoring otherwise Recent Labs  Lab 07/01/20 0808 07/02/20 0306 07/03/20 0402 07/04/20 0233 07/05/20 0426  WBC 3.0* 2.3* 2.2* 2.1* 2.9*   Secondary hyperparathyroidism -Continue sevelamer per nephrology -Continue renal diet  Hypoalbuminemia -Level of 1 on admission with goal of 2.5 per nephrology -currently on albumin 25 g BID until goal reached -Albumin is 1.6 on blood work today  11/30.  Right trigeminal neuralgia -Not on meds  Mobility: Encourage ambulation Code Status:   Code Status: Full Code  Nutritional status: Body mass index is 39.25 kg/m.     Diet Order             Diet renal with fluid restriction Fluid restriction: 1200 mL Fluid; Room service appropriate? Yes; Fluid consistency: Thin  Diet effective now                 DVT prophylaxis: heparin injection 5,000 Units Start: 06/30/20 1400 SCDs Start: 06/30/20 1314   Antimicrobials:  HIV meds Fluid: No fluid Consultants: Nephrology, vascular surgery Family Communication:  Friend at bedside  Status is: Inpatient  Remains inpatient appropriate because: New dialysis initiated  Dispo: The patient is from: Home              Anticipated d/c is to: Home              Anticipated d/c date is: 2 to 3 days              Patient currently is not medically stable to d/c.       Infusions:    Scheduled Meds: . acetaminophen  650 mg Oral Q6H  . atovaquone  1,500 mg Oral Q breakfast  . azithromycin  500 mg Oral Daily  . bictegravir-emtricitabine-tenofovir AF  1 tablet Oral Daily  . heparin  5,000 Units Subcutaneous Q8H  . [START ON 07/06/2020] influenza vac split quadrivalent PF  0.5 mL Intramuscular Tomorrow-1000  . kidney failure book   Does not apply Once  . multivitamin  1 tablet Oral QHS  . sevelamer carbonate  800 mg Oral TID WC  . sodium bicarbonate  1,300 mg Oral TID    Antimicrobials: Anti-infectives (From admission, onward)   Start     Dose/Rate Route Frequency Ordered Stop   07/05/20 0000  bictegravir-emtricitabine-tenofovir AF (BIKTARVY) 50-200-25 MG TABS tablet        1 tablet Oral Daily 07/05/20 1409     07/04/20 0000  vancomycin (VANCOCIN) IVPB 1000 mg/200 mL premix        1,000 mg 200 mL/hr over 60 Minutes Intravenous To Surgery 07/03/20 0854 07/04/20 0755   07/03/20 1530  azithromycin (ZITHROMAX) tablet 500 mg        500 mg Oral Daily 07/03/20 1435 07/06/20 0959   07/03/20 1015  bictegravir-emtricitabine-tenofovir AF (BIKTARVY) 50-200-25 MG per tablet 1 tablet        1 tablet Oral Daily 07/03/20 0949     07/02/20 1000  atovaquone (MEPRON) 750 MG/5ML suspension  1,500 mg        1,500 mg Oral Daily with breakfast 07/02/20 0901        PRN meds: calcium carbonate (dosed in mg elemental calcium), camphor-menthol **AND** hydrOXYzine, dextromethorphan, docusate sodium, feeding supplement (NEPRO CARB STEADY), hydrALAZINE, labetalol, loperamide, menthol-cetylpyridinium, morphine injection, ondansetron **OR** ondansetron (ZOFRAN) IV, oxyCODONE, sorbitol, zolpidem   Objective: Vitals:   07/05/20 0452 07/05/20 1002  BP: (!) 141/95 (!) 128/91  Pulse: 80 94  Resp: 17 18  Temp: 97.8 F (36.6 C) 98 F (36.7 C)  SpO2: 95% 100%    Intake/Output Summary (Last 24 hours) at 07/05/2020 1539 Last data filed at 07/05/2020 0900 Gross per 24 hour  Intake 490 ml  Output --  Net 490 ml   Filed Weights   07/03/20 0610 07/04/20 0445 07/05/20 0452  Weight: 97.9 kg 96.6 kg 97.3 kg   Weight change: 0.726 kg  Body mass index is 39.25 kg/m.   Physical Exam: General exam: Not in physical distress Skin: No rashes, lesions or ulcers. HEENT: Atraumatic, normocephalic, no obvious bleeding Lungs: Clear to auscultation bilaterally CVS: Regular rate and rhythm, no murmur GI/Abd soft, nontender, nondistended, bowel sound present CNS: Alert, awake, oriented x3 Psychiatry: Mood appropriate Extremities: Left forearm with AV graft site, tender and swollen.  Data Review: I have personally reviewed the laboratory data and studies available.  Recent Labs  Lab 07/01/20 0808 07/02/20 0306 07/03/20 0402 07/04/20 0233 07/05/20 0426  WBC 3.0* 2.3* 2.2* 2.1* 2.9*  NEUTROABS  --  1.3* 1.1* 1.1* 1.5*  HGB 11.5* 9.7* 9.7* 11.3* 10.5*  HCT 34.5* 28.3* 28.3* 33.8* 32.7*  MCV 86.7 84.0 84.5 85.6 88.9  PLT 158 146* 137* 124* 104*   Recent Labs  Lab 06/30/20 1133 06/30/20 1459 07/02/20 0306 07/03/20 0402 07/04/20 0233 07/05/20 0426 07/05/20 0855  NA 139   < > 142 145 146* 142 138  K 5.1   < > 3.3* 2.9* 4.0 4.2 4.5  CL 116*   < > 113* 108 112* 107 106  CO2 12*   < >  16* 25 22 21* 23  GLUCOSE 109*   < > 88 94 84 86 119*  BUN 71*   < > 68* 59* 55* 60* 59*  CREATININE 10.02*   < > 9.42* 9.16* 9.82* 10.51* 10.48*  CALCIUM 7.3*   < > 6.9* 6.6* 7.2* 7.5* 7.4*  MG 1.8  --  1.4* 1.3* 1.8 1.7  --   PHOS 8.3*   < > 7.2* 5.6* 5.9* 6.2* 5.8*   < > = values in this interval not displayed.    F/u labs ordered  Signed, Terrilee Croak, MD [facility] 07/05/2020

## 2020-07-05 NOTE — Progress Notes (Signed)
  Progress Note    07/05/2020 12:46 PM 1 Day Post-Op  Subjective:  Left forearm intermittent sharp pains and tightness and also soreness around graft site. Says she feels that it is getting worse. She says she actually has some improved sensation in left hand. Coolness unchanged   Vitals:   07/05/20 0452 07/05/20 1002  BP: (!) 141/95 (!) 128/91  Pulse: 80 94  Resp: 17 18  Temp: 97.8 F (36.6 C) 98 F (36.7 C)  SpO2: 95% 100%   Physical Exam: Cardiac: regular Lungs: non labored Incisions: left forearm incisions intact. Some bloody oozing from proximal forearm incision. Steri strips present Extremities: left forearm tenderness over loop. Palpable thrill in AVG. Left forearm mildly edematous as well as hand. Left fingers cool. 2/5 grip strength but some of this secondary to discomfort. Biphasic Doppler Radial signal. Unable to get ulnar or palmar signals. Motor and sensation intact Neurologic: alert and oriented  CBC    Component Value Date/Time   WBC 2.9 (L) 07/05/2020 0426   RBC 3.68 (L) 07/05/2020 0426   HGB 10.5 (L) 07/05/2020 0426   HGB 14.1 12/09/2019 0954   HCT 32.7 (L) 07/05/2020 0426   HCT 41.3 12/09/2019 0954   PLT 104 (L) 07/05/2020 0426   MCV 88.9 07/05/2020 0426   MCV 86 12/09/2019 0954   MCH 28.5 07/05/2020 0426   MCHC 32.1 07/05/2020 0426   RDW 15.4 07/05/2020 0426   RDW 13.7 12/09/2019 0954   LYMPHSABS 0.8 07/05/2020 0426   LYMPHSABS 1.1 12/09/2019 0954   MONOABS 0.2 07/05/2020 0426   EOSABS 0.3 07/05/2020 0426   EOSABS 0.2 12/09/2019 0954   BASOSABS 0.0 07/05/2020 0426   BASOSABS 0.0 12/09/2019 0954    BMET    Component Value Date/Time   NA 138 07/05/2020 0855   NA 139 12/09/2019 0954   K 4.5 07/05/2020 0855   CL 106 07/05/2020 0855   CO2 23 07/05/2020 0855   GLUCOSE 119 (H) 07/05/2020 0855   BUN 59 (H) 07/05/2020 0855   BUN 21 12/15/2019 0848   CREATININE 10.48 (H) 07/05/2020 0855   CALCIUM 7.4 (L) 07/05/2020 0855   CALCIUM 7.0 (L)  06/30/2020 1459   GFRNONAA 4 (L) 07/05/2020 0855   GFRAA 33 (L) 12/15/2019 0848    INR    Component Value Date/Time   INR 0.9 04/13/2020 0620     Intake/Output Summary (Last 24 hours) at 07/05/2020 1246 Last data filed at 07/05/2020 0900 Gross per 24 hour  Intake 490 ml  Output --  Net 490 ml     Assessment/Plan:  44 y.o. female is s/p LUE early access forearm loop AV Graft 1 Day Post-Op. She is having some worsening left forearm pain. Continues to have coolness which she feels is unchanged. Presently pain is not rest pain. Intermittent pains and surgical site tenderness in area of tunneling. Good Thrill in AFG. Continues to have biphasic radial signal. Discussed with patient and friend in room that if pain progresses or worsening steal symptoms may need to have graft removed/ ligated. Pain control and continue to monitor    Karoline Caldwell, PA-C [facility] [phone] 07/05/2020 12:46 PM

## 2020-07-05 NOTE — TOC Benefit Eligibility Note (Signed)
Patient Advocate Encounter  I was successful in securing patient a $ 7,500.00 grant from [employer] to provide copayment coverage for Biktarvy. The patient's out of pocket cost will be $0.00 monthly.      Member ID: [id number] Group ID: [id number] RxBin: [id number] PCN: [id number] Dates of Eligibility: 07/05/2020 through 08/05/2020      Lyndel Safe, [place] Certified Pharmacy Technician- Transitions of [place] [employer] Stewardship Team Direct Number: [phone]  Fax: [phone]

## 2020-07-05 NOTE — Progress Notes (Signed)
Pt states her hand is hurting, cool to touch, pulse checked using doppler. Tylenol given Vascular MD made aware. PA to come see pt. Pt informed

## 2020-07-05 NOTE — Progress Notes (Signed)
Patient ID: Brooke Baldwin, female   DOB: May 29, 1976, 44 y.o.   MRN: [id number] S: Feels tired and c/o left arm pain at site of surgery. O:BP (!) 128/91 (BP Location: Right Arm)   Pulse 94   Temp 98 F (36.7 C) (Oral)   Resp 18   Ht 5\' 2"  (1.575 m)   Wt 97.3 kg   LMP 04/02/2015 (Approximate)   SpO2 100%   BMI 39.25 kg/m   Intake/Output Summary (Last 24 hours) at 07/05/2020 1114 Last data filed at 07/05/2020 0900 Gross per 24 hour  Intake 730 ml  Output --  Net 730 ml   Intake/Output: I/O last 3 completed shifts: In: 39 [P.O.:490; I.V.:175] Out: 5 [Blood:5]  Intake/Output this shift:  Total I/O In: 240 [P.O.:240] Out: -  Weight change: 0.726 kg Gen: NAD CVS: RRR, no rub Resp: cta Abd: benign Ext: no edema, L forearm AVG +T/B, with some edema and tenderness to palpation.  Recent Labs  Lab 06/30/20 1133 06/30/20 1459 07/01/20 0808 07/01/20 1511 07/02/20 0306 07/03/20 0402 07/04/20 0233 07/05/20 0426 07/05/20 0855  NA 139   < > 140 141 142 145 146* 142 138  K 5.1   < > 4.1 4.2 3.3* 2.9* 4.0 4.2 4.5  CL 116*   < > 118* 118* 113* 108 112* 107 106  CO2 12*   < > 10* 13* 16* 25 22 21* 23  GLUCOSE 109*   < > 85 87 88 94 84 86 119*  BUN 71*   < > 63* 68* 68* 59* 55* 60* 59*  CREATININE 10.02*   < > 9.24* 9.44* 9.42* 9.16* 9.82* 10.51* 10.48*  ALBUMIN  --   --  1.0*  --  1.7* 2.0* 1.6*  1.6* 1.4* 1.6*  CALCIUM 7.3*   < > 7.0* 6.9* 6.9* 6.6* 7.2* 7.5* 7.4*  PHOS 8.3*  --  8.0*  --  7.2* 5.6* 5.9* 6.2* 5.8*  AST  --   --   --   --   --   --  82*  --   --   ALT  --   --   --   --   --   --  42  --   --    < > = values in this interval not displayed.   Liver Function Tests: Recent Labs  Lab 07/04/20 0233 07/05/20 0426 07/05/20 0855  AST 82*  --   --   ALT 42  --   --   ALKPHOS 67  --   --   BILITOT 0.8  --   --   PROT 4.0*  --   --   ALBUMIN 1.6*  1.6* 1.4* 1.6*   No results for input(s): LIPASE, AMYLASE in the last 168 hours. No results for  input(s): AMMONIA in the last 168 hours. CBC: Recent Labs  Lab 07/01/20 0808 07/01/20 0808 07/02/20 0306 07/02/20 0306 07/03/20 0402 07/04/20 0233 07/05/20 0426  WBC 3.0*   < > 2.3*   < > 2.2* 2.1* 2.9*  NEUTROABS  --   --  1.3*   < > 1.1* 1.1* 1.5*  HGB 11.5*   < > 9.7*   < > 9.7* 11.3* 10.5*  HCT 34.5*   < > 28.3*   < > 28.3* 33.8* 32.7*  MCV 86.7  --  84.0  --  84.5 85.6 88.9  PLT 158   < > 146*   < > 137* 124* 104*   < > =  values in this interval not displayed.   Cardiac Enzymes: No results for input(s): CKTOTAL, CKMB, CKMBINDEX, TROPONINI in the last 168 hours. CBG: No results for input(s): GLUCAP in the last 168 hours.  Iron Studies: No results for input(s): IRON, TIBC, TRANSFERRIN, FERRITIN in the last 72 hours. Studies/Results: VAS [place] UPPER EXT VEIN MAPPING (PRE-OP AVF)  Result Date: 07/04/2020 UPPER EXTREMITY VEIN MAPPING  Indications: Pre-access. History: ESRD.  Limitations: IV, bandages, skin injury from tape, body habitus Comparison Study: No prior study on file Performing Technologist: Sharion Dove RVS  Examination Guidelines: A complete evaluation includes B-mode imaging, spectral Doppler, color Doppler, and power Doppler as needed of all accessible portions of each vessel. Bilateral testing is considered an integral part of a complete examination. Limited examinations for reoccurring indications may be performed as noted. +-----------------+-------------+----------+--------------+ Right Cephalic   Diameter (cm)Depth (cm)   Findings    +-----------------+-------------+----------+--------------+ Prox upper arm                          not visualized +-----------------+-------------+----------+--------------+ Mid upper arm                           not visualized +-----------------+-------------+----------+--------------+ Dist upper arm                          not visualized +-----------------+-------------+----------+--------------+ Antecubital fossa     0.19        0.33                  +-----------------+-------------+----------+--------------+ Prox forearm         0.16        0.46                  +-----------------+-------------+----------+--------------+ Mid forearm          0.22        0.17                  +-----------------+-------------+----------+--------------+ Wrist                0.13        0.17                  +-----------------+-------------+----------+--------------+ Patient could not tolerate probe touching medial arm. +-----------------+-------------+----------+--------------+ Left Cephalic    Diameter (cm)Depth (cm)   Findings    +-----------------+-------------+----------+--------------+ Prox upper arm       0.33        0.88                  +-----------------+-------------+----------+--------------+ Mid upper arm        0.32        0.70                  +-----------------+-------------+----------+--------------+ Dist upper arm       0.29        0.86                  +-----------------+-------------+----------+--------------+ Antecubital fossa    0.40        0.57                  +-----------------+-------------+----------+--------------+ Prox forearm         0.32        0.44                  +-----------------+-------------+----------+--------------+  Mid forearm          0.28        0.74                  +-----------------+-------------+----------+--------------+ Dist forearm         0.12        0.92                  +-----------------+-------------+----------+--------------+ Wrist                                   not visualized +-----------------+-------------+----------+--------------+ +-----------------+-------------+----------+---------+ Left Basilic     Diameter (cm)Depth (cm)Findings  +-----------------+-------------+----------+---------+ Prox upper arm       0.34        1.64    origin   +-----------------+-------------+----------+---------+ Mid upper arm         0.38        1.19             +-----------------+-------------+----------+---------+ Dist upper arm       0.35        0.96             +-----------------+-------------+----------+---------+ Antecubital fossa    0.29        0.68             +-----------------+-------------+----------+---------+ Prox forearm         0.26        0.42             +-----------------+-------------+----------+---------+ Mid forearm          0.30        0.35             +-----------------+-------------+----------+---------+ Distal forearm       0.16        0.31   branching +-----------------+-------------+----------+---------+ Wrist                0.09        0.17             +-----------------+-------------+----------+---------+ *See table(s) above for measurements and observations.  Diagnosing physician: Jamelle Haring Electronically signed by Jamelle Haring on 07/04/2020 at 12:35:53 PM.    Final    . atovaquone  1,500 mg Oral Q breakfast  . azithromycin  500 mg Oral Daily  . bictegravir-emtricitabine-tenofovir AF  1 tablet Oral Daily  . heparin  5,000 Units Subcutaneous Q8H  . [START ON 07/06/2020] influenza vac split quadrivalent PF  0.5 mL Intramuscular Tomorrow-1000  . kidney failure book   Does not apply Once  . multivitamin  1 tablet Oral QHS  . sevelamer carbonate  800 mg Oral TID WC  . sodium bicarbonate  1,300 mg Oral TID    BMET    Component Value Date/Time   NA 138 07/05/2020 0855   NA 139 12/09/2019 0954   K 4.5 07/05/2020 0855   CL 106 07/05/2020 0855   CO2 23 07/05/2020 0855   GLUCOSE 119 (H) 07/05/2020 0855   BUN 59 (H) 07/05/2020 0855   BUN 21 12/15/2019 0848   CREATININE 10.48 (H) 07/05/2020 0855   CALCIUM 7.4 (L) 07/05/2020 0855   CALCIUM 7.0 (L) 06/30/2020 1459   GFRNONAA 4 (L) 07/05/2020 0855   GFRAA 33 (L) 12/15/2019 0848   CBC    Component Value Date/Time   WBC 2.9 (L) 07/05/2020 0426   RBC 3.68 (L) 07/05/2020 8182  HGB 10.5 (L) 07/05/2020 0426   HGB  14.1 12/09/2019 0954   HCT 32.7 (L) 07/05/2020 0426   HCT 41.3 12/09/2019 0954   PLT 104 (L) 07/05/2020 0426   MCV 88.9 07/05/2020 0426   MCV 86 12/09/2019 0954   MCH 28.5 07/05/2020 0426   MCHC 32.1 07/05/2020 0426   RDW 15.4 07/05/2020 0426   RDW 13.7 12/09/2019 0954   LYMPHSABS 0.8 07/05/2020 0426   LYMPHSABS 1.1 12/09/2019 0954   MONOABS 0.2 07/05/2020 0426   EOSABS 0.3 07/05/2020 0426   EOSABS 0.2 12/09/2019 0954   BASOSABS 0.0 07/05/2020 0426   BASOSABS 0.0 12/09/2019 0954     Assessment/Plan:  1. 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  S/p acuseal LAVG and can be used in the next 24-48 hours. 1. No emergent need for dialysis so will try to wait as long as possible before cannulating the Acuseal AVG.   2. Will likely to start dialysis in the next 24-48 hours if her BUN/Cr continue to climb.  2. HIV/AIDS- has not been well controlled and not on ART for over a year.  Will start [place].  ID consulted and now managing her medications. 3.  Metabolic acidosis - due to CKD, resolved with oral bicarb 4. HTN- stable 5. Enteropathogenic E. Coli- on 3 day course of azithromycin per primary svc 6. Pancytopenia- per ID. 7. SHPTH- on binders and will follow. 8. Vascular access- s/p Left forearm Acuseal AVG placed 07/04/20.  Can be used in the next 24 hours, 17 gauge needles, BFR <250 for first 2 post-operative weeks, and low dose heparin should be used.  10-15 minutes of pressure after decannulating.  Donetta Potts, MD [employer] [phone]

## 2020-07-05 NOTE — Care Management Important Message (Signed)
Important Message  Patient Details  Name: Brooke Baldwin MRN: [id number] Date of Birth: 10-19-1975   Medicare Important Message Given:  Yes     [name] 07/05/2020, 10:28 AM

## 2020-07-06 DIAGNOSIS — N179 Acute kidney failure, unspecified: Secondary | ICD-10-CM | POA: Diagnosis not present

## 2020-07-06 DIAGNOSIS — N185 Chronic kidney disease, stage 5: Secondary | ICD-10-CM | POA: Diagnosis not present

## 2020-07-06 LAB — CBC WITH DIFFERENTIAL/PLATELET
Abs Immature Granulocytes: 0.02 10*3/uL (ref 0.00–0.07)
Basophils Absolute: 0 10*3/uL (ref 0.0–0.1)
Basophils Relative: 0 %
Eosinophils Absolute: 0.3 10*3/uL (ref 0.0–0.5)
Eosinophils Relative: 8 %
HCT: 36.2 % (ref 36.0–46.0)
Hemoglobin: 11.4 g/dL — ABNORMAL LOW (ref 12.0–15.0)
Immature Granulocytes: 1 %
Lymphocytes Relative: 24 %
Lymphs Abs: 1 10*3/uL (ref 0.7–4.0)
MCH: 28.5 pg (ref 26.0–34.0)
MCHC: 31.5 g/dL (ref 30.0–36.0)
MCV: 90.5 fL (ref 80.0–100.0)
Monocytes Absolute: 0.4 10*3/uL (ref 0.1–1.0)
Monocytes Relative: 9 %
Neutro Abs: 2.2 10*3/uL (ref 1.7–7.7)
Neutrophils Relative %: 58 %
Platelets: 122 10*3/uL — ABNORMAL LOW (ref 150–400)
RBC: 4 MIL/uL (ref 3.87–5.11)
RDW: 15.4 % (ref 11.5–15.5)
WBC: 3.9 10*3/uL — ABNORMAL LOW (ref 4.0–10.5)
nRBC: 0 % (ref 0.0–0.2)

## 2020-07-06 LAB — RENAL FUNCTION PANEL
Albumin: 1.5 g/dL — ABNORMAL LOW (ref 3.5–5.0)
Anion gap: 13 (ref 5–15)
BUN: 61 mg/dL — ABNORMAL HIGH (ref 6–20)
CO2: 23 mmol/L (ref 22–32)
Calcium: 7.7 mg/dL — ABNORMAL LOW (ref 8.9–10.3)
Chloride: 105 mmol/L (ref 98–111)
Creatinine, Ser: 10.99 mg/dL — ABNORMAL HIGH (ref 0.44–1.00)
GFR, Estimated: 4 mL/min — ABNORMAL LOW (ref >60)
Glucose, Bld: 89 mg/dL (ref 70–99)
Phosphorus: 7.3 mg/dL — ABNORMAL HIGH (ref 2.5–4.6)
Potassium: 4.4 mmol/L (ref 3.5–5.1)
Sodium: 141 mmol/L (ref 135–145)

## 2020-07-06 LAB — MAGNESIUM: Magnesium: 1.7 mg/dL (ref 1.7–2.4)

## 2020-07-06 NOTE — Progress Notes (Addendum)
  Postoperative hemodialysis access     Date of Surgery:  07/04/2020 Surgeon: Stanford Breed  Subjective:  Says the feeling in the hand is a little bit better.  Still having pain in the forearm.  Says fingers are cold.   PHYSICAL EXAMINATION:  Vitals:   07/06/20 0606 07/06/20 0734  BP: (!) 123/93 (!) 152/103  Pulse: 100 97  Resp: 18   Temp: 98.3 F (36.8 C) 98 F (36.7 C)  SpO2: 98% 98%    Incisions are clean and dry with steri strips in place Sensation in digits is intact;  Difficult to palpate graft due to tenderness & swelling around the graft but sounds good with doppler + monophasic doppler signals left ulnar>radial.    ASSESSMENT/PLAN:  Brooke Baldwin is a 44 y.o. year old female who is s/p Left FA loop graft on 07/04/2020 by Dr. Stanford Breed.  -graft is patent -pt has + monophasic doppler signals left ulnar>radial.  Her left hand grip is weak and it is difficult to determine if this is due to pain or steal.  The sensation in her fingers is slightly better than yesterday. Will continue to monitor.  I discussed with pt should the pain in her left hand or numbness get worse, we need to be contacted.  -will continue to monitor   Leontine Locket, PA-C [facility] [phone]  VASCULAR STAFF ADDENDUM: I have independently interviewed and examined the patient. I agree with the above.  Suspect she has mild steal, but this is not threatening the hand at the moment. I think most of her discomfort is from tunneling. OK to use graft any time for HD. She is quite tender about the graft, so would benefit from topical analgesia before cannulation. Will continue to monitor the hand.  [name]. Stanford Breed, MD [facility] Phone Number: [phone] 07/06/2020 1:43 PM

## 2020-07-06 NOTE — Progress Notes (Signed)
Patient ID: Brooke Baldwin, female   DOB: [birth year], 44 y.o.   MRN: [id number] S: Feels tired and c/o left arm discomfort. O:BP (!) 144/99 (BP Location: Right Arm)   Pulse 95   Temp 97.9 F (36.6 C) (Oral)   Resp 18   Ht 5\' 2"  (1.575 m)   Wt 98.4 kg   LMP 04/02/2015 (Approximate)   SpO2 99%   BMI 39.68 kg/m   Intake/Output Summary (Last 24 hours) at 07/06/2020 1226 Last data filed at 07/06/2020 1100 Gross per 24 hour  Intake 840 ml  Output 200 ml  Net 640 ml   Intake/Output: I/O last 3 completed shifts: In: 840 [P.O.:840] Out: 200 [Urine:200]  Intake/Output this shift:  Total I/O In: 240 [P.O.:240] Out: -  Weight change: 1.058 kg Gen: NAD CVS: RRR, no rub Resp: cta Abd: +BS, soft, NT/ND Ext: no edema of lower ext, Left forearm avg +T/B, +edema and tenderness at site.  Left hand cooler than right but nontender.  Recent Labs  Lab 07/01/20 0808 07/01/20 0808 07/01/20 1511 07/02/20 0306 07/03/20 0402 07/04/20 0233 07/05/20 0426 07/05/20 0855 07/06/20 0040  NA 140   < > 141 142 145 146* 142 138 141  K 4.1   < > 4.2 3.3* 2.9* 4.0 4.2 4.5 4.4  CL 118*   < > 118* 113* 108 112* 107 106 105  CO2 10*   < > 13* 16* 25 22 21* 23 23  GLUCOSE 85   < > 87 88 94 84 86 119* 89  BUN 63*   < > 68* 68* 59* 55* 60* 59* 61*  CREATININE 9.24*   < > 9.44* 9.42* 9.16* 9.82* 10.51* 10.48* 10.99*  ALBUMIN 1.0*  --   --  1.7* 2.0* 1.6*  1.6* 1.4* 1.6* 1.5*  CALCIUM 7.0*   < > 6.9* 6.9* 6.6* 7.2* 7.5* 7.4* 7.7*  PHOS 8.0*  --   --  7.2* 5.6* 5.9* 6.2* 5.8* 7.3*  AST  --   --   --   --   --  82*  --   --   --   ALT  --   --   --   --   --  42  --   --   --    < > = values in this interval not displayed.   Liver Function Tests: Recent Labs  Lab 07/04/20 0233 07/04/20 0233 07/05/20 0426 07/05/20 0855 07/06/20 0040  AST 82*  --   --   --   --   ALT 42  --   --   --   --   ALKPHOS 67  --   --   --   --   BILITOT 0.8  --   --   --   --   PROT 4.0*  --   --   --   --    ALBUMIN 1.6*  1.6*   < > 1.4* 1.6* 1.5*   < > = values in this interval not displayed.   No results for input(s): LIPASE, AMYLASE in the last 168 hours. No results for input(s): AMMONIA in the last 168 hours. CBC: Recent Labs  Lab 07/02/20 0306 07/02/20 0306 07/03/20 0402 07/03/20 0402 07/04/20 0233 07/05/20 0426 07/06/20 0040  WBC 2.3*   < > 2.2*   < > 2.1* 2.9* 3.9*  NEUTROABS 1.3*   < > 1.1*   < > 1.1* 1.5* 2.2  HGB 9.7*   < >  9.7*   < > 11.3* 10.5* 11.4*  HCT 28.3*   < > 28.3*   < > 33.8* 32.7* 36.2  MCV 84.0  --  84.5  --  85.6 88.9 90.5  PLT 146*   < > 137*   < > 124* 104* 122*   < > = values in this interval not displayed.   Cardiac Enzymes: No results for input(s): CKTOTAL, CKMB, CKMBINDEX, TROPONINI in the last 168 hours. CBG: No results for input(s): GLUCAP in the last 168 hours.  Iron Studies: No results for input(s): IRON, TIBC, TRANSFERRIN, FERRITIN in the last 72 hours. Studies/Results: No results found. Mar[place] Kitchen acetaminophen  650 mg Oral Q6H  . atovaquone  1,500 mg Oral Q breakfast  . bictegravir-emtricitabine-tenofovir AF  1 tablet Oral Daily  . heparin  5,000 Units Subcutaneous Q8H  . kidney failure book   Does not apply Once  . multivitamin  1 tablet Oral QHS  . sevelamer carbonate  800 mg Oral TID WC  . sodium bicarbonate  1,300 mg Oral TID    BMET    Component Value Date/Time   NA 141 07/06/2020 0040   NA 139 12/09/2019 0954   K 4.4 07/06/2020 0040   CL 105 07/06/2020 0040   CO2 23 07/06/2020 0040   GLUCOSE 89 07/06/2020 0040   BUN 61 (H) 07/06/2020 0040   BUN 21 12/15/2019 0848   CREATININE 10.99 (H) 07/06/2020 0040   CALCIUM 7.7 (L) 07/06/2020 0040   CALCIUM 7.0 (L) 06/30/2020 1459   GFRNONAA 4 (L) 07/06/2020 0040   GFRAA 33 (L) 12/15/2019 0848   CBC    Component Value Date/Time   WBC 3.9 (L) 07/06/2020 0040   RBC 4.00 07/06/2020 0040   HGB 11.4 (L) 07/06/2020 0040   HGB 14.1 12/09/2019 0954   HCT 36.2 07/06/2020 0040   HCT 41.3  12/09/2019 0954   PLT 122 (L) 07/06/2020 0040   MCV 90.5 07/06/2020 0040   MCV 86 12/09/2019 0954   MCH 28.5 07/06/2020 0040   MCHC 31.5 07/06/2020 0040   RDW 15.4 07/06/2020 0040   RDW 13.7 12/09/2019 0954   LYMPHSABS 1.0 07/06/2020 0040   LYMPHSABS 1.1 12/09/2019 0954   MONOABS 0.4 07/06/2020 0040   EOSABS 0.3 07/06/2020 0040   EOSABS 0.2 12/09/2019 0954   BASOSABS 0.0 07/06/2020 0040   BASOSABS 0.0 12/09/2019 0954    ssessment/Plan:  1. 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 S/p acuseal LAVG and can be used in the next 24-48 hours. 1. No emergent need for dialysis so will try to wait as long as possible before cannulating the Acuseal AVG which unfortunately is causing some discomfort.   2. VVS evaluating for possible steal syndrome but appears to be slowly improving. 3. Will likely to start dialysis this week if her BUN/Cr continue to climb but waiting due to discomfort of AVG.  2. HIV/AIDS- has not been well controlled and not on ART for over a year. Will start [place]. ID consulted and now managing her medications. 3. Metabolic acidosis - due to CKD, resolved with oral bicarb 4. HTN- stable 5. Enteropathogenic E. Coli- on 3 day course of azithromycin per primary svc 6. Pancytopenia- per ID. 7. SHPTH- on binders and will follow. 8. Vascular access- s/p Left forearm Acuseal AVG placed 07/04/20. Can be used in the next 24 hours, 17 gauge needles, BFR <250 for first 2 post-operative weeks, and low dose heparin should be used.  10-15 minutes of pressure after decannulating.  Donetta Potts, MD [employer] [phone]

## 2020-07-06 NOTE — Progress Notes (Signed)
PROGRESS NOTE  Brooke Baldwin  DOB: Jul 04, 1976  PCP: Patient, No Pcp Per [id number]  DOA: 06/30/2020  LOS: 6 days   Chief Complaint  Patient presents with  . Abnormal Lab    Brief narrative: Brooke Baldwin is a 44 yo female with PMH CKD (progressive decline, currently stage V), HIV (from [place], no prior records), HTN, trigeminal neuralgia. On 11/25, patient presented to the ED with worsening renal function. She has been followed closely outpatient with nephrology and found to have deteriorating renal function. She has undergone work-up in the past including renal biopsy.  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   She was admitted for fluids and close monitoring in case of need for starting on dialysis inpatient.  She underwent repeat HIV labs outpatient with nephrology, however records unable to be viewed in epic.  ID clinic has also not restarted on treatment due to awaiting records from [place].  Labs or again been ordered during this hospitalization to further confirm and guide treatment.  She states that she has been living here for approximately 2 years and has been out of treatment meds for approximately 1 year.  Previously was on Tivicay and another.  Per nephro notes, Cd4 was 5 on recent office check.   HIV labs returned and were consistent with confirming diagnosis as well as significantly low CD4 count (less than 35).  She was evaluated by infectious disease and started on Biktarvy.  Atovaquone was started for PJP prophylaxis.  She was started on IV fluids and a bicarb drip on admission. Renal response/improvement was minimal. Due to her rapid progression of renal decline, nephrology recommended patient undergo VVS consultation with plans for initiation of HD in the next several days.  She underwent placement of left forearm Gore-Tex AV graft on 07/04/2020.  Subjective: Patient was seen and examined this morning. Lying on bed.  Not in  distress.  Left forearm pain gradually improving.  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 -Minimal response to IV fluid. -VVS consulted; patient underwent left AV graft placement on 07/04/2020.  Vascular surgery follow-up appreciated. -Patient understands and is amenable to dialysis  Elevated blood pressure  -Intermittently elevated blood pressure with no history of hypertension.   -Continue to monitor.   -For now, use PRN labetalol or hydralazine  HIV (human immunodeficiency virus infection) ([place]) - there has been difficulty confirming and obtaining CD4 count and VL outpatient - last CD4 is 5 per nephrology notes from check in office outpatient; unable to view records and ID clinic has not received records from [place] consult appreciated. 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  GI pathogen panel noted enteropathogenic E. coli.   -3-day course of azithromycin completed today 12/1 -PRN imodium  Neutropenia  -Nontoxic-appearing and low suspicion for infection at this time.  Possibly due to immunosuppression from HIV and/or GI illness -Continue monitoring otherwise Recent Labs  Lab 07/02/20 0306 07/03/20 0402 07/04/20 0233 07/05/20 0426 07/06/20 0040  WBC 2.3* 2.2* 2.1* 2.9* 3.9*   Secondary hyperparathyroidism -Continue sevelamer per nephrology -Continue renal diet  Hypoalbuminemia -Level of 1 on admission with goal of 2.5 per nephrology -currently on albumin 25 g BID until  goal reached -Albumin is 1.6 on blood work today 11/30.  Right trigeminal neuralgia -Not on meds  Mobility: Encourage ambulation Code Status:   Code Status: Full Code  Nutritional  status: Body mass index is 39.68 kg/m.     Diet Order            Diet renal with fluid restriction Fluid restriction: 1200 mL Fluid; Room service appropriate? Yes; Fluid consistency: Thin  Diet effective now                 DVT prophylaxis: heparin injection 5,000 Units Start: 06/30/20 1400 SCDs Start: 06/30/20 1314   Antimicrobials:  HIV meds Fluid: No fluid Consultants: Nephrology, vascular surgery Family Communication:  Family not at bedside  Status is: Inpatient  Remains inpatient appropriate because: New dialysis initiated  Dispo: The patient is from: Home              Anticipated d/c is to: Home              Anticipated d/c date is: 2 to 3 days              Patient currently is not medically stable to d/c.       Infusions:    Scheduled Meds: . acetaminophen  650 mg Oral Q6H  . atovaquone  1,500 mg Oral Q breakfast  . bictegravir-emtricitabine-tenofovir AF  1 tablet Oral Daily  . heparin  5,000 Units Subcutaneous Q8H  . kidney failure book   Does not apply Once  . multivitamin  1 tablet Oral QHS  . sevelamer carbonate  800 mg Oral TID WC  . sodium bicarbonate  1,300 mg Oral TID    Antimicrobials: Anti-infectives (From admission, onward)   Start     Dose/Rate Route Frequency Ordered Stop   07/05/20 0000  bictegravir-emtricitabine-tenofovir AF (BIKTARVY) 50-200-25 MG TABS tablet        1 tablet Oral Daily 07/05/20 1409     07/04/20 0000  vancomycin (VANCOCIN) IVPB 1000 mg/200 mL premix        1,000 mg 200 mL/hr over 60 Minutes Intravenous To Surgery 07/03/20 0854 07/04/20 0755   07/03/20 1530  azithromycin (ZITHROMAX) tablet 500 mg        500 mg Oral Daily 07/03/20 1435 07/06/20 0959   07/03/20 1015  bictegravir-emtricitabine-tenofovir AF (BIKTARVY) 50-200-25 MG per tablet 1 tablet        1 tablet Oral Daily 07/03/20 0949     07/02/20 1000  atovaquone (MEPRON) 750 MG/5ML suspension 1,500 mg        1,500 mg Oral Daily with breakfast 07/02/20 0901         PRN meds: calcium carbonate (dosed in mg elemental calcium), camphor-menthol **AND** hydrOXYzine, dextromethorphan, docusate sodium, feeding supplement (NEPRO CARB STEADY), hydrALAZINE, labetalol, loperamide, menthol-cetylpyridinium, morphine injection, ondansetron **OR** ondansetron (ZOFRAN) IV, oxyCODONE, sorbitol, zolpidem   Objective: Vitals:   07/06/20 0734 07/06/20 1134  BP: (!) 152/103 (!) 144/99  Pulse: 97 95  Resp:  18  Temp: 98 F (36.7 C) 97.9 F (36.6 C)  SpO2: 98% 99%    Intake/Output Summary (Last 24 hours) at 07/06/2020 1205 Last data filed at 07/06/2020 1100 Gross per 24 hour  Intake 840 ml  Output 200 ml  Net 640 ml   Filed Weights   07/04/20 0445 07/05/20 0452 07/06/20 0606  Weight: 96.6 kg 97.3 kg 98.4 kg   Weight change: 1.058 kg Body mass index is 39.68 kg/m.   Physical Exam: General exam:  Not in physical distress Skin: No rashes, lesions or ulcers. HEENT: Atraumatic, normocephalic, no obvious bleeding Lungs: Clear to auscultation bilaterally CVS: Regular rate and rhythm, no murmur GI/Abd soft, nontender, nondistended, bowel sound present CNS: Alert, awake, oriented x3 Psychiatry: Mood appropriate Extremities: Left forearm with AV graft site, swelling and tenderness improving  Data Review: I have personally reviewed the laboratory data and studies available.  Recent Labs  Lab 07/02/20 0306 07/03/20 0402 07/04/20 0233 07/05/20 0426 07/06/20 0040  WBC 2.3* 2.2* 2.1* 2.9* 3.9*  NEUTROABS 1.3* 1.1* 1.1* 1.5* 2.2  HGB 9.7* 9.7* 11.3* 10.5* 11.4*  HCT 28.3* 28.3* 33.8* 32.7* 36.2  MCV 84.0 84.5 85.6 88.9 90.5  PLT 146* 137* 124* 104* 122*   Recent Labs  Lab 07/02/20 0306 07/02/20 0306 07/03/20 0402 07/04/20 0233 07/05/20 0426 07/05/20 0855 07/06/20 0040  NA 142   < > 145 146* 142 138 141  K 3.3*   < > 2.9* 4.0 4.2 4.5 4.4  CL 113*   < > 108 112* 107 106 105  CO2 16*   < > 25 22 21* 23 23  GLUCOSE 88   < > 94 84 86 119* 89  BUN  68*   < > 59* 55* 60* 59* 61*  CREATININE 9.42*   < > 9.16* 9.82* 10.51* 10.48* 10.99*  CALCIUM 6.9*   < > 6.6* 7.2* 7.5* 7.4* 7.7*  MG 1.4*  --  1.3* 1.8 1.7  --  1.7  PHOS 7.2*   < > 5.6* 5.9* 6.2* 5.8* 7.3*   < > = values in this interval not displayed.    F/u labs ordered  Signed, Terrilee Croak, MD [facility] 07/06/2020

## 2020-07-07 DIAGNOSIS — N185 Chronic kidney disease, stage 5: Secondary | ICD-10-CM | POA: Diagnosis not present

## 2020-07-07 DIAGNOSIS — N179 Acute kidney failure, unspecified: Secondary | ICD-10-CM | POA: Diagnosis not present

## 2020-07-07 LAB — RENAL FUNCTION PANEL
Albumin: 1.4 g/dL — ABNORMAL LOW (ref 3.5–5.0)
Anion gap: 13 (ref 5–15)
BUN: 66 mg/dL — ABNORMAL HIGH (ref 6–20)
CO2: 21 mmol/L — ABNORMAL LOW (ref 22–32)
Calcium: 7.8 mg/dL — ABNORMAL LOW (ref 8.9–10.3)
Chloride: 104 mmol/L (ref 98–111)
Creatinine, Ser: 11.8 mg/dL — ABNORMAL HIGH (ref 0.44–1.00)
GFR, Estimated: 4 mL/min — ABNORMAL LOW (ref >60)
Glucose, Bld: 78 mg/dL (ref 70–99)
Phosphorus: 8.8 mg/dL — ABNORMAL HIGH (ref 2.5–4.6)
Potassium: 4.6 mmol/L (ref 3.5–5.1)
Sodium: 138 mmol/L (ref 135–145)

## 2020-07-07 LAB — HLA B*5701: HLA B 5701: NEGATIVE

## 2020-07-07 NOTE — Progress Notes (Signed)
Two small blistering areas to mid R forearm noted.  Denies any new pain to the area, circulation assessment remains from earlier in shift.  Provider notified.  Patient denies need for additional pain medication at this time.

## 2020-07-07 NOTE — Progress Notes (Addendum)
  Postoperative hemodialysis access     Date of Surgery:  07/04/2020 Surgeon: Stanford Breed   Subjective:  Says her left hand is still numb but no pain in her hand.  says her hand and arm feel heavy.  Continues to have pain around the graft.   PHYSICAL EXAMINATION:  Vitals:   07/07/20 0108 07/07/20 0433  BP: 127/86 125/86  Pulse: 91 97  Resp: 16 18  Temp: 98 F (36.7 C) 98.2 F (36.8 C)  SpO2: 98% 98%    Incisions are intact Sensation in digits is decreased There is bruit. The graft is difficult to palpate due to pain  -radial and ulnar doppler signals are a little better today.  There is a faint palmar arch doppler signal   ASSESSMENT/PLAN:  Brooke Baldwin is a 44 y.o. year old female who is s/p left FA acuseal loop graft.  -graft is patent -pt has better radial and ulnar doppler signals today.  She continues to have left hand numbness and decreased hand grip.  Will need to continue to closely monitor her left hand.  -continues to have tenderness around the graft, which is most likely due to the tunneling.     Leontine Locket, PA-C [facility] [phone]  VASCULAR STAFF ADDENDUM: I have independently interviewed and examined the patient. I agree with the above.  Overall improving. Mild steal seems to be resolving. I expect this to continue to improve. Still quite tender about the graft. OK to cannulate for HD at any time, EMLA topical anesthetic may be helpful for her first several cannulations. Following.  [name]. Stanford Breed, MD [facility] Phone Number: [phone] 07/07/2020 10:03 AM

## 2020-07-07 NOTE — Progress Notes (Signed)
PROGRESS NOTE  Brooke Baldwin  DOB: [birth year]  PCP: Patient, No Pcp Per [id number]  DOA: 06/30/2020  LOS: 7 days   Chief Complaint  Patient presents with  . Abnormal Lab    Brief narrative: [name] is a 44 yo female with PMH CKD (progressive decline, currently stage V), HIV (from [place], no prior records), HTN, trigeminal neuralgia. On 11/25, patient presented to the ED with worsening renal function. She has been followed closely outpatient with nephrology and found to have deteriorating renal function. She has undergone work-up in the past including renal biopsy.  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   She was admitted for fluids and close monitoring in case of need for starting on dialysis inpatient.  She underwent repeat HIV labs outpatient with nephrology, however records unable to be viewed in epic.  ID clinic has also not restarted on treatment due to awaiting records from [place].  Labs were again ordered during this hospitalization to further confirm and guide treatment.  She states that she has been living here for approximately 2 years and has been out of treatment meds for approximately 1 year.  Previously was on Tivicay and another.  Per nephro notes, Cd4 was 5 on recent office check.   HIV labs returned and were consistent with confirming diagnosis as well as significantly low CD4 count (less than 35).  She was evaluated by infectious disease and started on Biktarvy.  Atovaquone was started for PJP prophylaxis.  She was started on IV fluids and a bicarb drip on admission. Renal response/improvement was minimal. Due to her rapid progression of renal decline, nephrology recommended patient undergo VVS consultation with plans for initiation of HD in the next several days.  She underwent placement of left forearm Gore-Tex AV graft on 07/04/2020.  Subjective: Patient was seen and examined this morning. Continues to have pain on  the left forearm. She has 2 new blisters in the left forearm. Lying on bed.  Not in distress.  Left forearm pain gradually improving.  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 -Minimal response to IV fluid. -VVS consulted; patient underwent left AV graft placement on 07/04/2020. Patient has discomfort at the site of AV graft placement. Vascular surgery is evaluating for possible steal syndrome. Patient now also has significant duration blisters on the forearm. Per nephrology, will hold off on cannulating the graft and apply ice to forearm. She does not have any emergent need of dialysis at this time.  Elevated blood pressure  -Intermittently elevated blood pressure with no history of hypertension.   -Continue to monitor.   -For now, use PRN labetalol or hydralazine  HIV (human immunodeficiency virus infection) ([name]) - there has been difficulty confirming and obtaining CD4 count and VL outpatient - last CD4 is 5 per nephrology notes from check in office outpatient; unable to view records and ID clinic has not received records from [place] consult appreciated. 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  GI pathogen panel noted enteropathogenic E. coli.   -3-day course of azithromycin completed today 12/1 -PRN imodium  Neutropenia  -Nontoxic-appearing and low suspicion for infection at this time.  Possibly due to immunosuppression from HIV  and/or GI illness -Continue monitoring otherwise Recent Labs  Lab 07/02/20 0306 07/03/20 0402 07/04/20 0233 07/05/20 0426 07/06/20 0040  WBC 2.3* 2.2* 2.1* 2.9* 3.9*   Secondary hyperparathyroidism -Continue sevelamer per nephrology -Continue  renal diet  Hypoalbuminemia -Level of 1 on admission with goal of 2.5 per nephrology -currently on albumin 25 g BID until goal reached -Albumin is 1.6 on blood work today 11/30.  Right trigeminal neuralgia -Not on meds  Mobility: Encourage ambulation Code Status:   Code Status: Full Code  Nutritional status: Body mass index is 41.88 kg/m.     Diet Order            Diet renal with fluid restriction Fluid restriction: 1200 mL Fluid; Room service appropriate? Yes; Fluid consistency: Thin  Diet effective now                 DVT prophylaxis: heparin injection 5,000 Units Start: 06/30/20 1400 SCDs Start: 06/30/20 1314   Antimicrobials:  HIV meds Fluid: No fluid Consultants: Nephrology, vascular surgery Family Communication:  Friend at bedside  Status is: Inpatient  Remains inpatient appropriate because: Waiting for initiation of new dialysis  Dispo: The patient is from: Home              Anticipated d/c is to: Home              Anticipated d/c date is: More than 3 days              Patient currently is not medically stable to d/c.   Infusions:    Scheduled Meds: . acetaminophen  650 mg Oral Q6H  . atovaquone  1,500 mg Oral Q breakfast  . bictegravir-emtricitabine-tenofovir AF  1 tablet Oral Daily  . heparin  5,000 Units Subcutaneous Q8H  . kidney failure book   Does not apply Once  . multivitamin  1 tablet Oral QHS  . sevelamer carbonate  800 mg Oral TID WC  . sodium bicarbonate  1,300 mg Oral TID    Antimicrobials: Anti-infectives (From admission, onward)   Start     Dose/Rate Route Frequency Ordered Stop   07/05/20 0000  bictegravir-emtricitabine-tenofovir AF (BIKTARVY) 50-200-25 MG TABS tablet        1 tablet Oral Daily 07/05/20 1409     07/04/20 0000  vancomycin (VANCOCIN) IVPB 1000 mg/200 mL premix        1,000 mg 200 mL/hr over 60 Minutes Intravenous To Surgery 07/03/20 0854 07/04/20 0755   07/03/20 1530  azithromycin (ZITHROMAX) tablet 500 mg         500 mg Oral Daily 07/03/20 1435 07/06/20 0959   07/03/20 1015  bictegravir-emtricitabine-tenofovir AF (BIKTARVY) 50-200-25 MG per tablet 1 tablet        1 tablet Oral Daily 07/03/20 0949     07/02/20 1000  atovaquone (MEPRON) 750 MG/5ML suspension 1,500 mg        1,500 mg Oral Daily with breakfast 07/02/20 0901        PRN meds: calcium carbonate (dosed in mg elemental calcium), camphor-menthol **AND** hydrOXYzine, dextromethorphan, docusate sodium, feeding supplement (NEPRO CARB STEADY), hydrALAZINE, labetalol, loperamide, menthol-cetylpyridinium, morphine injection, ondansetron **OR** ondansetron (ZOFRAN) IV, oxyCODONE, sorbitol, zolpidem   Objective: Vitals:   07/07/20 0433 07/07/20 0922  BP: 125/86 (!) 142/91  Pulse: 97 (!) 101  Resp: 18 20  Temp: 98.2 F (36.8 C) (!) 97.5 F (36.4 C)  SpO2: 98% 97%    Intake/Output Summary (Last 24 hours) at 07/07/2020 1707 Last data filed  at 07/07/2020 0900 Gross per 24 hour  Intake 720 ml  Output 300 ml  Net 420 ml   Filed Weights   07/05/20 0452 07/06/20 0606 07/07/20 0433  Weight: 97.3 kg 98.4 kg 103.9 kg   Weight change: 5.474 kg Body mass index is 41.88 kg/m.   Physical Exam: General exam: Not in physical distress Skin: No rashes, lesions or ulcers. HEENT: Atraumatic, normocephalic, no obvious bleeding Lungs: Clear to auscultation bilaterally CVS: Regular rate and rhythm, no murmur GI/Abd soft, nontender, nondistended, bowel sound present CNS: Alert, awake monitor x3 Psychiatry: Mood appropriate Extremities: Left forearm AV graft site is inflamed, tender and has 2 blisters.  Data Review: I have personally reviewed the laboratory data and studies available.  Recent Labs  Lab 07/02/20 0306 07/03/20 0402 07/04/20 0233 07/05/20 0426 07/06/20 0040  WBC 2.3* 2.2* 2.1* 2.9* 3.9*  NEUTROABS 1.3* 1.1* 1.1* 1.5* 2.2  HGB 9.7* 9.7* 11.3* 10.5* 11.4*  HCT 28.3* 28.3* 33.8* 32.7* 36.2  MCV 84.0 84.5 85.6 88.9 90.5  PLT  146* 137* 124* 104* 122*   Recent Labs  Lab 07/02/20 0306 07/02/20 0306 07/03/20 0402 07/03/20 0402 07/04/20 0233 07/05/20 0426 07/05/20 0855 07/06/20 0040 07/07/20 0439  NA 142   < > 145   < > 146* 142 138 141 138  K 3.3*   < > 2.9*   < > 4.0 4.2 4.5 4.4 4.6  CL 113*   < > 108   < > 112* 107 106 105 104  CO2 16*   < > 25   < > 22 21* 23 23 21*  GLUCOSE 88   < > 94   < > 84 86 119* 89 78  BUN 68*   < > 59*   < > 55* 60* 59* 61* 66*  CREATININE 9.42*   < > 9.16*   < > 9.82* 10.51* 10.48* 10.99* 11.80*  CALCIUM 6.9*   < > 6.6*   < > 7.2* 7.5* 7.4* 7.7* 7.8*  MG 1.4*  --  1.3*  --  1.8 1.7  --  1.7  --   PHOS 7.2*   < > 5.6*   < > 5.9* 6.2* 5.8* 7.3* 8.8*   < > = values in this interval not displayed.    F/u labs ordered  Signed, Terrilee Croak, MD [facility] 07/07/2020

## 2020-07-07 NOTE — Progress Notes (Signed)
Patient ID: Brooke Baldwin, female   DOB: 09-06-1975, 44 y.o.   MRN: [id number] S: Still complaining of forearm pain and noticed 2 small blisters this afternoon. O:BP (!) 142/91 (BP Location: Right Arm)   Pulse (!) 101   Temp (!) 97.5 F (36.4 C) (Oral)   Resp 20   Ht 5\' 2"  (1.575 m)   Wt 103.9 kg   LMP 04/02/2015 (Approximate)   SpO2 97%   BMI 41.88 kg/m   Intake/Output Summary (Last 24 hours) at 07/07/2020 1246 Last data filed at 07/07/2020 0900 Gross per 24 hour  Intake 960 ml  Output 600 ml  Net 360 ml   Intake/Output: I/O last 3 completed shifts: In: 1320 [P.O.:1320] Out: 600 [Urine:600]  Intake/Output this shift:  Total I/O In: 480 [P.O.:480] Out: -  Weight change: 5.474 kg Gen: NAD CVS: tachy at 101 Resp: CTA Abd: benign Ext: trace pretibial edema, L forearm AVG + induration, tenderness, 2 small vesscles on lateral surface of forearm, +T/B  Recent Labs  Lab 07/02/20 0306 07/03/20 0402 07/04/20 0233 07/05/20 0426 07/05/20 0855 07/06/20 0040 07/07/20 0439  NA 142 145 146* 142 138 141 138  K 3.3* 2.9* 4.0 4.2 4.5 4.4 4.6  CL 113* 108 112* 107 106 105 104  CO2 16* 25 22 21* 23 23 21*  GLUCOSE 88 94 84 86 119* 89 78  BUN 68* 59* 55* 60* 59* 61* 66*  CREATININE 9.42* 9.16* 9.82* 10.51* 10.48* 10.99* 11.80*  ALBUMIN 1.7* 2.0* 1.6*  1.6* 1.4* 1.6* 1.5* 1.4*  CALCIUM 6.9* 6.6* 7.2* 7.5* 7.4* 7.7* 7.8*  PHOS 7.2* 5.6* 5.9* 6.2* 5.8* 7.3* 8.8*  AST  --   --  82*  --   --   --   --   ALT  --   --  42  --   --   --   --    Liver Function Tests: Recent Labs  Lab 07/04/20 0233 07/05/20 0426 07/05/20 0855 07/06/20 0040 07/07/20 0439  AST 82*  --   --   --   --   ALT 42  --   --   --   --   ALKPHOS 67  --   --   --   --   BILITOT 0.8  --   --   --   --   PROT 4.0*  --   --   --   --   ALBUMIN 1.6*  1.6*   < > 1.6* 1.5* 1.4*   < > = values in this interval not displayed.   No results for input(s): LIPASE, AMYLASE in the last 168 hours. No results  for input(s): AMMONIA in the last 168 hours. CBC: Recent Labs  Lab 07/02/20 0306 07/02/20 0306 07/03/20 0402 07/03/20 0402 07/04/20 0233 07/05/20 0426 07/06/20 0040  WBC 2.3*   < > 2.2*   < > 2.1* 2.9* 3.9*  NEUTROABS 1.3*   < > 1.1*   < > 1.1* 1.5* 2.2  HGB 9.7*   < > 9.7*   < > 11.3* 10.5* 11.4*  HCT 28.3*   < > 28.3*   < > 33.8* 32.7* 36.2  MCV 84.0  --  84.5  --  85.6 88.9 90.5  PLT 146*   < > 137*   < > 124* 104* 122*   < > = values in this interval not displayed.   Cardiac Enzymes: No results for input(s): CKTOTAL, CKMB, CKMBINDEX, TROPONINI in the last 168  hours. CBG: No results for input(s): GLUCAP in the last 168 hours.  Iron Studies: No results for input(s): IRON, TIBC, TRANSFERRIN, FERRITIN in the last 72 hours. Studies/Results: No results found. Marland Kitchen acetaminophen  650 mg Oral Q6H  . atovaquone  1,500 mg Oral Q breakfast  . bictegravir-emtricitabine-tenofovir AF  1 tablet Oral Daily  . heparin  5,000 Units Subcutaneous Q8H  . kidney failure book   Does not apply Once  . multivitamin  1 tablet Oral QHS  . sevelamer carbonate  800 mg Oral TID WC  . sodium bicarbonate  1,300 mg Oral TID    BMET    Component Value Date/Time   NA 138 07/07/2020 0439   NA 139 12/09/2019 0954   K 4.6 07/07/2020 0439   CL 104 07/07/2020 0439   CO2 21 (L) 07/07/2020 0439   GLUCOSE 78 07/07/2020 0439   BUN 66 (H) 07/07/2020 0439   BUN 21 12/15/2019 0848   CREATININE 11.80 (H) 07/07/2020 0439   CALCIUM 7.8 (L) 07/07/2020 0439   CALCIUM 7.0 (L) 06/30/2020 1459   GFRNONAA 4 (L) 07/07/2020 0439   GFRAA 33 (L) 12/15/2019 0848   CBC    Component Value Date/Time   WBC 3.9 (L) 07/06/2020 0040   RBC 4.00 07/06/2020 0040   HGB 11.4 (L) 07/06/2020 0040   HGB 14.1 12/09/2019 0954   HCT 36.2 07/06/2020 0040   HCT 41.3 12/09/2019 0954   PLT 122 (L) 07/06/2020 0040   MCV 90.5 07/06/2020 0040   MCV 86 12/09/2019 0954   MCH 28.5 07/06/2020 0040   MCHC 31.5 07/06/2020 0040   RDW 15.4  07/06/2020 0040   RDW 13.7 12/09/2019 0954   LYMPHSABS 1.0 07/06/2020 0040   LYMPHSABS 1.1 12/09/2019 0954   MONOABS 0.4 07/06/2020 0040   EOSABS 0.3 07/06/2020 0040   EOSABS 0.2 12/09/2019 0954   BASOSABS 0.0 07/06/2020 0040   BASOSABS 0.0 12/09/2019 0954    Assessment/Plan:  1. 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 S/p acuseal LAVG and can be used in the next 24-48 hours. 1. No emergent need for dialysis so will try to waitas long as possiblebefore cannulating the Acuseal AVG which unfortunately is causing some discomfort.   2. VVS evaluating for possible steal syndrome but appears to be slowly improving. 3. Now with induration and blisters on forearm, will hold off on cannulating AVG for now and apply ice to forearm.  VVS following.  4. Will likely to start dialysis this week if her BUN/Cr continue to climb but waiting due to discomfort of AVG. 2. HIV/AIDS- has not been well controlled and not on ART for over a year. Will start [place]. ID consulted and now managing her medications. 3. Metabolic acidosis - due to CKD, resolved with oral bicarb 4. HTN- stable 5. Enteropathogenic E. Coli- on 3 day course of azithromycin per primary svc 6. Pancytopenia- per ID. 7. SHPTH- on binders and will follow. 8. Vascular access- s/p Left forearm Acuseal AVG placed 07/04/20. Can be used in the next 24 hours, 17 gauge needles, BFR <250 for first 2 post-operative weeks, and low dose heparin should be used. 10-15 minutes of pressure after decannulating.  Donetta Potts, MD [employer] [phone]

## 2020-07-08 DIAGNOSIS — N179 Acute kidney failure, unspecified: Secondary | ICD-10-CM | POA: Diagnosis not present

## 2020-07-08 DIAGNOSIS — N185 Chronic kidney disease, stage 5: Secondary | ICD-10-CM | POA: Diagnosis not present

## 2020-07-08 LAB — RENAL FUNCTION PANEL
Albumin: 1.3 g/dL — ABNORMAL LOW (ref 3.5–5.0)
Anion gap: 15 (ref 5–15)
BUN: 68 mg/dL — ABNORMAL HIGH (ref 6–20)
CO2: 21 mmol/L — ABNORMAL LOW (ref 22–32)
Calcium: 7.6 mg/dL — ABNORMAL LOW (ref 8.9–10.3)
Chloride: 102 mmol/L (ref 98–111)
Creatinine, Ser: 12.32 mg/dL — ABNORMAL HIGH (ref 0.44–1.00)
GFR, Estimated: 3 mL/min — ABNORMAL LOW (ref >60)
Glucose, Bld: 81 mg/dL (ref 70–99)
Phosphorus: 8.5 mg/dL — ABNORMAL HIGH (ref 2.5–4.6)
Potassium: 4.4 mmol/L (ref 3.5–5.1)
Sodium: 138 mmol/L (ref 135–145)

## 2020-07-08 MED ORDER — CHLORHEXIDINE GLUCONATE CLOTH 2 % EX PADS
6.0000 | MEDICATED_PAD | Freq: Every day | CUTANEOUS | Status: DC
  Administered 2020-07-09 – 2020-07-12 (×3): 6 via TOPICAL

## 2020-07-08 NOTE — Progress Notes (Signed)
Patient ID: Brooke Baldwin, female   DOB: [birth year], 44 y.o.   MRN: [id number] S: No new complaints still with some left forearm tenderness and edema.  O:BP 116/83 (BP Location: Right Arm)   Pulse 99   Temp 98.5 F (36.9 C) (Oral)   Resp 16   Ht 5\' 2"  (1.575 m)   Wt 99.2 kg   LMP 04/02/2015 (Approximate)   SpO2 97%   BMI 40.02 kg/m   Intake/Output Summary (Last 24 hours) at 07/08/2020 1422 Last data filed at 07/08/2020 0856 Gross per 24 hour  Intake 840 ml  Output 700 ml  Net 140 ml   Intake/Output: I/O last 3 completed shifts: In: 1440 [P.O.:1440] Out: 700 [Urine:700]  Intake/Output this shift:  Total I/O In: 120 [P.O.:120] Out: 300 [Urine:300] Weight change: -4.627 kg Gen: NAD CVS: no rub Resp: CTA Abd: +BS, soft, NT/ND Ext: left forearm AVG +T/B, + edema and small blisters on both venous and arterial portion of avg  Recent Labs  Lab 07/03/20 0402 07/04/20 0233 07/05/20 0426 07/05/20 0855 07/06/20 0040 07/07/20 0439 07/08/20 0321  NA 145 146* 142 138 141 138 138  K 2.9* 4.0 4.2 4.5 4.4 4.6 4.4  CL 108 112* 107 106 105 104 102  CO2 25 22 21* 23 23 21* 21*  GLUCOSE 94 84 86 119* 89 78 81  BUN 59* 55* 60* 59* 61* 66* 68*  CREATININE 9.16* 9.82* 10.51* 10.48* 10.99* 11.80* 12.32*  ALBUMIN 2.0* 1.6*  1.6* 1.4* 1.6* 1.5* 1.4* 1.3*  CALCIUM 6.6* 7.2* 7.5* 7.4* 7.7* 7.8* 7.6*  PHOS 5.6* 5.9* 6.2* 5.8* 7.3* 8.8* 8.5*  AST  --  82*  --   --   --   --   --   ALT  --  42  --   --   --   --   --    Liver Function Tests: Recent Labs  Lab 07/04/20 0233 07/05/20 0426 07/06/20 0040 07/07/20 0439 07/08/20 0321  AST 82*  --   --   --   --   ALT 42  --   --   --   --   ALKPHOS 67  --   --   --   --   BILITOT 0.8  --   --   --   --   PROT 4.0*  --   --   --   --   ALBUMIN 1.6*  1.6*   < > 1.5* 1.4* 1.3*   < > = values in this interval not displayed.   No results for input(s): LIPASE, AMYLASE in the last 168 hours. No results for input(s): AMMONIA in the  last 168 hours. CBC: Recent Labs  Lab 07/02/20 0306 07/02/20 0306 07/03/20 0402 07/03/20 0402 07/04/20 0233 07/05/20 0426 07/06/20 0040  WBC 2.3*   < > 2.2*   < > 2.1* 2.9* 3.9*  NEUTROABS 1.3*   < > 1.1*   < > 1.1* 1.5* 2.2  HGB 9.7*   < > 9.7*   < > 11.3* 10.5* 11.4*  HCT 28.3*   < > 28.3*   < > 33.8* 32.7* 36.2  MCV 84.0  --  84.5  --  85.6 88.9 90.5  PLT 146*   < > 137*   < > 124* 104* 122*   < > = values in this interval not displayed.   Cardiac Enzymes: No results for input(s): CKTOTAL, CKMB, CKMBINDEX, TROPONINI in the last 168 hours. CBG: No  results for input(s): GLUCAP in the last 168 hours.  Iron Studies: No results for input(s): IRON, TIBC, TRANSFERRIN, FERRITIN in the last 72 hours. Studies/Results: No results found. Marland Kitchen acetaminophen  650 mg Oral Q6H  . atovaquone  1,500 mg Oral Q breakfast  . bictegravir-emtricitabine-tenofovir AF  1 tablet Oral Daily  . heparin  5,000 Units Subcutaneous Q8H  . kidney failure book   Does not apply Once  . multivitamin  1 tablet Oral QHS  . sevelamer carbonate  800 mg Oral TID WC  . sodium bicarbonate  1,300 mg Oral TID    BMET    Component Value Date/Time   NA 138 07/08/2020 0321   NA 139 12/09/2019 0954   K 4.4 07/08/2020 0321   CL 102 07/08/2020 0321   CO2 21 (L) 07/08/2020 0321   GLUCOSE 81 07/08/2020 0321   BUN 68 (H) 07/08/2020 0321   BUN 21 12/15/2019 0848   CREATININE 12.32 (H) 07/08/2020 0321   CALCIUM 7.6 (L) 07/08/2020 0321   CALCIUM 7.0 (L) 06/30/2020 1459   GFRNONAA 3 (L) 07/08/2020 0321   GFRAA 33 (L) 12/15/2019 0848   CBC    Component Value Date/Time   WBC 3.9 (L) 07/06/2020 0040   RBC 4.00 07/06/2020 0040   HGB 11.4 (L) 07/06/2020 0040   HGB 14.1 12/09/2019 0954   HCT 36.2 07/06/2020 0040   HCT 41.3 12/09/2019 0954   PLT 122 (L) 07/06/2020 0040   MCV 90.5 07/06/2020 0040   MCV 86 12/09/2019 0954   MCH 28.5 07/06/2020 0040   MCHC 31.5 07/06/2020 0040   RDW 15.4 07/06/2020 0040   RDW 13.7  12/09/2019 0954   LYMPHSABS 1.0 07/06/2020 0040   LYMPHSABS 1.1 12/09/2019 0954   MONOABS 0.4 07/06/2020 0040   EOSABS 0.3 07/06/2020 0040   EOSABS 0.2 12/09/2019 0954   BASOSABS 0.0 07/06/2020 0040   BASOSABS 0.0 12/09/2019 0954     Assessment/Plan:  1. 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 S/p acuseal LAVG and can be used in the next 24-48 hours. 1. No emergent need for dialysis so will try to waitas long as possiblebefore cannulating the Acuseal AVGwhich unfortunately is causing some discomfort.  2. VVS evaluating for possible steal syndrome but appears to be slowly improving. 3. Now with induration and blisters on forearm, will hold off on cannulating AVG for now and apply ice to forearm.  VVS following.  4. Will likely to start dialysistomorrow and use emla cream.  Hopefully she will tolerate cannulation tomorrow.  2. HIV/AIDS- has not been well controlled and not on ART for over a year. Will start [place]. ID consulted and now managing her medications. 3. Metabolic acidosis - due to CKD, resolved with oral bicarb 4. HTN- stable 5. Enteropathogenic E. Coli- on 3 day course of azithromycin per primary svc 6. Pancytopenia- per ID. 7. SHPTH- on binders and will follow. 8. Vascular access- s/p Left forearm Acuseal AVG placed 07/04/20. Can be used in the next 24 hours, 17 gauge needles, BFR <250 for first 2 post-operative weeks, and low dose heparin should be used. 10-15 minutes of pressure after decannulating.  Donetta Potts, MD [employer] [phone]

## 2020-07-08 NOTE — TOC Progression Note (Signed)
Transition of Care [facility]) - Progression Note    Patient Details  Name: Brooke Baldwin MRN: [id number] Date of Birth: 18-May-1976  [facility] CM/SW Contact  Zenon Mayo, RN Phone Number: 07/08/2020, 5:38 PM  Clinical Narrative:    From home , neprhorlogy following,  s/p Left forearm Acuseal AVG placed 07/04/20.  For dialysis tomorrow.  TOC will cont to follow.        Expected Discharge Plan and Services                                                 Social Determinants of Health (SDOH) Interventions    Readmission Risk Interventions No flowsheet data found.

## 2020-07-08 NOTE — Progress Notes (Signed)
PROGRESS NOTE  Brooke Baldwin  DOB: Dec 08, 1975  PCP: Patient, No Pcp Per [id number]  DOA: 06/30/2020  LOS: 8 days   Chief Complaint  Patient presents with  . Abnormal Lab    Brief narrative: Brooke Baldwin is a 44 yo female with PMH CKD (progressive decline, currently stage V), HIV (from [place], no prior records), HTN, trigeminal neuralgia. On 11/25, patient presented to the ED with worsening renal function. She has been followed closely outpatient with nephrology and found to have deteriorating renal function. She has undergone work-up in the past including renal biopsy.  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   She was admitted for fluids and close monitoring in case of need for starting on dialysis inpatient.  She underwent repeat HIV labs outpatient with nephrology, however records unable to be viewed in epic.  ID clinic has also not restarted on treatment due to awaiting records from [place].  Labs were again ordered during this hospitalization to further confirm and guide treatment.  She states that she has been living here for approximately 2 years and has been out of treatment meds for approximately 1 year.  Previously was on [place] and another.  Per nephro notes, Cd4 was 5 on recent office check.   HIV labs returned and were consistent with confirming diagnosis as well as significantly low CD4 count (less than 35).  She was evaluated by infectious disease and started on Biktarvy.  Atovaquone was started for PJP prophylaxis.  She was started on IV fluids and a bicarb drip on admission. Renal response/improvement was minimal. Due to her rapid progression of renal decline, nephrology recommended patient undergo VVS consultation with plans for initiation of HD in the next several days.  She underwent placement of left forearm Gore-Tex AV graft on 07/04/2020.  Subjective: Patient was seen and examined this morning. Continues to have slowly,  discomfort and blisters in the left forearm at the site of graft. No longer having diarrhea.   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 -Minimal response to IV fluid. -VVS consulted; patient underwent left AV graft placement on 07/04/2020. Patient has discomfort at the site of AV graft placement. Vascular surgery evaluated for possible steal syndrome. Patient now also has significant development of blisters on the forearm. Per nephrology, will hold off on cannulating the graft and apply ice to forearm. She does not have any emergent need of dialysis at this time.  Defer to nephrology for the timing of dialysis initiation.  Elevated blood pressure  -Intermittently elevated blood pressure with no history of hypertension.   -Continue to monitor.   -For now, use PRN labetalol or hydralazine  HIV (human immunodeficiency virus infection) ([place]) - there has been difficulty confirming and obtaining CD4 count and VL outpatient - last CD4 is 5 per nephrology notes from check in office outpatient; unable to view records and ID clinic has not received records from [place] consult appreciated. 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  GI pathogen panel noted enteropathogenic E. coli.   -3-day course of azithromycin completed on 12/1.  Diarrhea improved.  Neutropenia  -Nontoxic-appearing and low suspicion for infection at this time.  Possibly due to immunosuppression  from HIV and/or GI illness -Continue monitoring otherwise Recent Labs  Lab 07/02/20 0306 07/03/20 0402 07/04/20 0233 07/05/20 0426 07/06/20 0040  WBC 2.3* 2.2* 2.1* 2.9* 3.9*   Secondary hyperparathyroidism -Continue  sevelamer per nephrology -Continue renal diet  Hypoalbuminemia -Level of 1 on admission with goal of 2.5 per nephrology -currently on albumin 25 g BID until goal reached -Albumin is 1.6 on blood work today 11/30.  Right trigeminal neuralgia -Not on meds  Mobility: Encourage ambulation Code Status:   Code Status: Full Code  Nutritional status: Body mass index is 40.02 kg/m.     Diet Order            Diet renal with fluid restriction Fluid restriction: 1200 mL Fluid; Room service appropriate? Yes; Fluid consistency: Thin  Diet effective now                 DVT prophylaxis: heparin injection 5,000 Units Start: 06/30/20 1400 SCDs Start: 06/30/20 1314   Antimicrobials:  HIV meds Fluid: No fluid Consultants: Nephrology, vascular surgery Family Communication:  Friend at bedside  Status is: Inpatient  Remains inpatient appropriate because: Waiting for initiation of new dialysis  Dispo: The patient is from: Home              Anticipated d/c is to: Home              Anticipated d/c date is: More than 3 days              Patient currently is not medically stable to d/c.   Infusions:    Scheduled Meds: . acetaminophen  650 mg Oral Q6H  . atovaquone  1,500 mg Oral Q breakfast  . bictegravir-emtricitabine-tenofovir AF  1 tablet Oral Daily  . heparin  5,000 Units Subcutaneous Q8H  . kidney failure book   Does not apply Once  . multivitamin  1 tablet Oral QHS  . sevelamer carbonate  800 mg Oral TID WC  . sodium bicarbonate  1,300 mg Oral TID    Antimicrobials: Anti-infectives (From admission, onward)   Start     Dose/Rate Route Frequency Ordered Stop   07/05/20 0000  bictegravir-emtricitabine-tenofovir AF (BIKTARVY) 50-200-25 MG TABS tablet        1 tablet Oral Daily 07/05/20 1409     07/04/20 0000  vancomycin (VANCOCIN) IVPB 1000 mg/200 mL premix        1,000 mg 200 mL/hr over 60 Minutes Intravenous To Surgery 07/03/20 0854 07/04/20 0755   07/03/20 1530   azithromycin (ZITHROMAX) tablet 500 mg        500 mg Oral Daily 07/03/20 1435 07/06/20 0959   07/03/20 1015  bictegravir-emtricitabine-tenofovir AF (BIKTARVY) 50-200-25 MG per tablet 1 tablet        1 tablet Oral Daily 07/03/20 0949     07/02/20 1000  atovaquone (MEPRON) 750 MG/5ML suspension 1,500 mg        1,500 mg Oral Daily with breakfast 07/02/20 0901        PRN meds: calcium carbonate (dosed in mg elemental calcium), camphor-menthol **AND** hydrOXYzine, dextromethorphan, docusate sodium, feeding supplement (NEPRO CARB STEADY), hydrALAZINE, labetalol, loperamide, menthol-cetylpyridinium, morphine injection, ondansetron **OR** ondansetron (ZOFRAN) IV, oxyCODONE, sorbitol, zolpidem   Objective: Vitals:   07/08/20 0449 07/08/20 1135  BP: (!) 130/92 116/83  Pulse: 92 99  Resp: 18 16  Temp: 97.7 F (36.5 C) 98.5 F (36.9 C)  SpO2: 99% 97%    Intake/Output Summary (Last 24 hours) at 07/08/2020 1407 Last data filed  at 07/08/2020 0856 Gross per 24 hour  Intake 840 ml  Output 700 ml  Net 140 ml   Filed Weights   07/06/20 0606 07/07/20 0433 07/08/20 0449  Weight: 98.4 kg 103.9 kg 99.2 kg   Weight change: -4.627 kg Body mass index is 40.02 kg/m.   Physical Exam: General exam: Not in physical distress Skin: No rashes, lesions or ulcers. HEENT: Atraumatic, normocephalic, no obvious bleeding Lungs: Clear to auscultation bilaterally CVS: Regular rate and rhythm, no murmur GI/Abd soft, nontender, nondistended, bowel sound present CNS: Alert, awake monitor x3 Psychiatry: Mood appropriate Extremities: Left forearm AV graft site remains over.  Continues to have blisters.  Data Review: I have personally reviewed the laboratory data and studies available.  Recent Labs  Lab 07/02/20 0306 07/03/20 0402 07/04/20 0233 07/05/20 0426 07/06/20 0040  WBC 2.3* 2.2* 2.1* 2.9* 3.9*  NEUTROABS 1.3* 1.1* 1.1* 1.5* 2.2  HGB 9.7* 9.7* 11.3* 10.5* 11.4*  HCT 28.3* 28.3* 33.8* 32.7* 36.2   MCV 84.0 84.5 85.6 88.9 90.5  PLT 146* 137* 124* 104* 122*   Recent Labs  Lab 07/02/20 0306 07/02/20 0306 07/03/20 0402 07/03/20 0402 07/04/20 0233 07/04/20 0233 07/05/20 0426 07/05/20 0855 07/06/20 0040 07/07/20 0439 07/08/20 0321  NA 142   < > 145   < > 146*   < > 142 138 141 138 138  K 3.3*   < > 2.9*   < > 4.0   < > 4.2 4.5 4.4 4.6 4.4  CL 113*   < > 108   < > 112*   < > 107 106 105 104 102  CO2 16*   < > 25   < > 22   < > 21* 23 23 21* 21*  GLUCOSE 88   < > 94   < > 84   < > 86 119* 89 78 81  BUN 68*   < > 59*   < > 55*   < > 60* 59* 61* 66* 68*  CREATININE 9.42*   < > 9.16*   < > 9.82*   < > 10.51* 10.48* 10.99* 11.80* 12.32*  CALCIUM 6.9*   < > 6.6*   < > 7.2*   < > 7.5* 7.4* 7.7* 7.8* 7.6*  MG 1.4*  --  1.3*  --  1.8  --  1.7  --  1.7  --   --   PHOS 7.2*   < > 5.6*   < > 5.9*   < > 6.2* 5.8* 7.3* 8.8* 8.5*   < > = values in this interval not displayed.    F/u labs ordered  Signed, Terrilee Croak, MD [facility] 07/08/2020

## 2020-07-08 NOTE — Progress Notes (Addendum)
  Progress Note    07/08/2020 7:34 AM 4 Days Post-Op  Subjective:  Left hand and arm feeling better. Still some numbness and still very sore around graft site. Arm continues to feel heavy but she says pain is improved and hand is warmer   Vitals:   07/07/20 2345 07/08/20 0449  BP: 130/89 (!) 130/92  Pulse: 99 92  Resp: 15 18  Temp: 98.7 F (37.1 C) 97.7 F (36.5 C)  SpO2: 100% 99%   Physical Exam: Cardiac:  RRR Lungs:  Non labored Incisions:  Left forearm incisions intact. Steri strips present. No drainage Extremities: left forearm edematous. Left forearm warm, left fingers warmer. Brisk radial and ulnar doppler signals. Doppler palmar signals present Neurologic: alert and oriented  CBC    Component Value Date/Time   WBC 3.9 (L) 07/06/2020 0040   RBC 4.00 07/06/2020 0040   HGB 11.4 (L) 07/06/2020 0040   HGB 14.1 12/09/2019 0954   HCT 36.2 07/06/2020 0040   HCT 41.3 12/09/2019 0954   PLT 122 (L) 07/06/2020 0040   MCV 90.5 07/06/2020 0040   MCV 86 12/09/2019 0954   MCH 28.5 07/06/2020 0040   MCHC 31.5 07/06/2020 0040   RDW 15.4 07/06/2020 0040   RDW 13.7 12/09/2019 0954   LYMPHSABS 1.0 07/06/2020 0040   LYMPHSABS 1.1 12/09/2019 0954   MONOABS 0.4 07/06/2020 0040   EOSABS 0.3 07/06/2020 0040   EOSABS 0.2 12/09/2019 0954   BASOSABS 0.0 07/06/2020 0040   BASOSABS 0.0 12/09/2019 0954    BMET    Component Value Date/Time   NA 138 07/08/2020 0321   NA 139 12/09/2019 0954   K 4.4 07/08/2020 0321   CL 102 07/08/2020 0321   CO2 21 (L) 07/08/2020 0321   GLUCOSE 81 07/08/2020 0321   BUN 68 (H) 07/08/2020 0321   BUN 21 12/15/2019 0848   CREATININE 12.32 (H) 07/08/2020 0321   CALCIUM 7.6 (L) 07/08/2020 0321   CALCIUM 7.0 (L) 06/30/2020 1459   GFRNONAA 3 (L) 07/08/2020 0321   GFRAA 33 (L) 12/15/2019 0848    INR    Component Value Date/Time   INR 0.9 04/13/2020 0620     Intake/Output Summary (Last 24 hours) at 07/08/2020 0734 Last data filed at 07/08/2020  0500 Gross per 24 hour  Intake 1200 ml  Output 400 ml  Net 800 ml     Assessment/Plan:  44 y.o. female is s/p left forearm loop graft 4 Days Post-Op. Left hand and forearm pain better this morning. Graft patent with good thrill. Improved doppler signals in left wrist and hand. Hand warmer. Still having tenderness around graft as expected. Okay to cannulate but nephrology waiting on labs as well as decrease in discomfort before initiating. We will continue to follow   Brooke Caldwell, PA-C [facility] [phone] 07/08/2020 7:34 AM   VASCULAR STAFF ADDENDUM: I agree with the above.  OK to cannulate. Please call for questions over the weekend.  [name]. Stanford Breed, MD [facility] Phone Number: [phone] 07/08/2020 11:21 AM

## 2020-07-09 DIAGNOSIS — N185 Chronic kidney disease, stage 5: Secondary | ICD-10-CM | POA: Diagnosis not present

## 2020-07-09 DIAGNOSIS — N179 Acute kidney failure, unspecified: Secondary | ICD-10-CM | POA: Diagnosis not present

## 2020-07-09 LAB — RENAL FUNCTION PANEL
Albumin: 1.2 g/dL — ABNORMAL LOW (ref 3.5–5.0)
Anion gap: 18 — ABNORMAL HIGH (ref 5–15)
BUN: 75 mg/dL — ABNORMAL HIGH (ref 6–20)
CO2: 21 mmol/L — ABNORMAL LOW (ref 22–32)
Calcium: 7.9 mg/dL — ABNORMAL LOW (ref 8.9–10.3)
Chloride: 103 mmol/L (ref 98–111)
Creatinine, Ser: 12.37 mg/dL — ABNORMAL HIGH (ref 0.44–1.00)
GFR, Estimated: 3 mL/min — ABNORMAL LOW (ref >60)
Glucose, Bld: 70 mg/dL (ref 70–99)
Phosphorus: 8.8 mg/dL — ABNORMAL HIGH (ref 2.5–4.6)
Potassium: 4.7 mmol/L (ref 3.5–5.1)
Sodium: 142 mmol/L (ref 135–145)

## 2020-07-09 MED ORDER — LIDOCAINE-PRILOCAINE 2.5-2.5 % EX CREA
TOPICAL_CREAM | Freq: Once | CUTANEOUS | Status: AC
  Filled 2020-07-09: qty 5

## 2020-07-09 NOTE — Plan of Care (Signed)
  Problem: Education: Goal: Knowledge of disease and its progression will improve Outcome: Progressing   Problem: Education: Goal: Knowledge of General Education information will improve Description: Including pain rating scale, medication(s)/side effects and non-pharmacologic comfort measures Outcome: Progressing   Problem: Elimination: Goal: Will not experience complications related to bowel motility Outcome: Progressing

## 2020-07-09 NOTE — Progress Notes (Signed)
Patient ID: Brooke Baldwin, female   DOB: 1975-11-13, 44 y.o.   MRN: [id number] S: Called to see patient due to pain and "coldness" of arm after emla cream applied. O:BP 132/90 (BP Location: Right Arm)   Pulse (!) 102   Temp 98.4 F (36.9 C) (Oral)   Resp 20   Ht 5\' 2"  (1.575 m)   Wt 98.8 kg   LMP 04/02/2015 (Approximate)   SpO2 97%   BMI 39.84 kg/m   Intake/Output Summary (Last 24 hours) at 07/09/2020 1151 Last data filed at 07/09/2020 0800 Gross per 24 hour  Intake 600 ml  Output 350 ml  Net 250 ml   Intake/Output: I/O last 3 completed shifts: In: 1080 [P.O.:1080] Out: 1050 [Urine:1050]  Intake/Output this shift:  Total I/O In: 120 [P.O.:120] Out: 0  Weight change: -0.454 kg Gen: NAD CVS: tachy at 102 Resp:cxta Abd: +BS, soft, NT/ND Ext: trace pretibial edema, left forearm avg +T/B, some edema and tenderness to palpation, fingers warm on left hand.  No change in blisters along avg.  Recent Labs  Lab 07/04/20 0233 07/05/20 0426 07/05/20 0855 07/06/20 0040 07/07/20 0439 07/08/20 0321 07/09/20 0317  NA 146* 142 138 141 138 138 142  K 4.0 4.2 4.5 4.4 4.6 4.4 4.7  CL 112* 107 106 105 104 102 103  CO2 22 21* 23 23 21* 21* 21*  GLUCOSE 84 86 119* 89 78 81 70  BUN 55* 60* 59* 61* 66* 68* 75*  CREATININE 9.82* 10.51* 10.48* 10.99* 11.80* 12.32* 12.37*  ALBUMIN 1.6*  1.6* 1.4* 1.6* 1.5* 1.4* 1.3* 1.2*  CALCIUM 7.2* 7.5* 7.4* 7.7* 7.8* 7.6* 7.9*  PHOS 5.9* 6.2* 5.8* 7.3* 8.8* 8.5* 8.8*  AST 82*  --   --   --   --   --   --   ALT 42  --   --   --   --   --   --    Liver Function Tests: Recent Labs  Lab 07/04/20 0233 07/05/20 0426 07/07/20 0439 07/08/20 0321 07/09/20 0317  AST 82*  --   --   --   --   ALT 42  --   --   --   --   ALKPHOS 67  --   --   --   --   BILITOT 0.8  --   --   --   --   PROT 4.0*  --   --   --   --   ALBUMIN 1.6*  1.6*   < > 1.4* 1.3* 1.2*   < > = values in this interval not displayed.   No results for input(s): LIPASE,  AMYLASE in the last 168 hours. No results for input(s): AMMONIA in the last 168 hours. CBC: Recent Labs  Lab 07/03/20 0402 07/03/20 0402 07/04/20 0233 07/05/20 0426 07/06/20 0040  WBC 2.2*   < > 2.1* 2.9* 3.9*  NEUTROABS 1.1*   < > 1.1* 1.5* 2.2  HGB 9.7*   < > 11.3* 10.5* 11.4*  HCT 28.3*   < > 33.8* 32.7* 36.2  MCV 84.5  --  85.6 88.9 90.5  PLT 137*   < > 124* 104* 122*   < > = values in this interval not displayed.   Cardiac Enzymes: No results for input(s): CKTOTAL, CKMB, CKMBINDEX, TROPONINI in the last 168 hours. CBG: No results for input(s): GLUCAP in the last 168 hours.  Iron Studies: No results for input(s): IRON, TIBC, TRANSFERRIN, FERRITIN in  the last 72 hours. Studies/Results: No results found. Marland Kitchen acetaminophen  650 mg Oral Q6H  . atovaquone  1,500 mg Oral Q breakfast  . bictegravir-emtricitabine-tenofovir AF  1 tablet Oral Daily  . Chlorhexidine Gluconate Cloth  6 each Topical Q0600  . heparin  5,000 Units Subcutaneous Q8H  . kidney failure book   Does not apply Once  . multivitamin  1 tablet Oral QHS  . sevelamer carbonate  800 mg Oral TID WC  . sodium bicarbonate  1,300 mg Oral TID    BMET    Component Value Date/Time   NA 142 07/09/2020 0317   NA 139 12/09/2019 0954   K 4.7 07/09/2020 0317   CL 103 07/09/2020 0317   CO2 21 (L) 07/09/2020 0317   GLUCOSE 70 07/09/2020 0317   BUN 75 (H) 07/09/2020 0317   BUN 21 12/15/2019 0848   CREATININE 12.37 (H) 07/09/2020 0317   CALCIUM 7.9 (L) 07/09/2020 0317   CALCIUM 7.0 (L) 06/30/2020 1459   GFRNONAA 3 (L) 07/09/2020 0317   GFRAA 33 (L) 12/15/2019 0848   CBC    Component Value Date/Time   WBC 3.9 (L) 07/06/2020 0040   RBC 4.00 07/06/2020 0040   HGB 11.4 (L) 07/06/2020 0040   HGB 14.1 12/09/2019 0954   HCT 36.2 07/06/2020 0040   HCT 41.3 12/09/2019 0954   PLT 122 (L) 07/06/2020 0040   MCV 90.5 07/06/2020 0040   MCV 86 12/09/2019 0954   MCH 28.5 07/06/2020 0040   MCHC 31.5 07/06/2020 0040   RDW  15.4 07/06/2020 0040   RDW 13.7 12/09/2019 0954   LYMPHSABS 1.0 07/06/2020 0040   LYMPHSABS 1.1 12/09/2019 0954   MONOABS 0.4 07/06/2020 0040   EOSABS 0.3 07/06/2020 0040   EOSABS 0.2 12/09/2019 0954   BASOSABS 0.0 07/06/2020 0040   BASOSABS 0.0 12/09/2019 0954    Assessment/Plan:  1. 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 S/p acuseal LAVG and can be used in the next 24-48 hours. 1. No emergent need for dialysis so will try to waitas long as possiblebefore cannulating the Acuseal AVGwhich unfortunately is causing some discomfort.  2. VVS evaluating for possible steal syndrome but appears to be slowly improving. 3. Now with induration and blisters on forearm, will hold off on cannulating AVG for now and apply ice to forearm. VVS following.  4. Was going to start dialysistoday however she was complaining of increased pain and "coldness" after emla cream was applied.  Will postpone dialysis and hopefully she will tolerate cannulation tomorrow or Monday.  Labs are stable.  2. HIV/AIDS- has not been well controlled and not on ART for over a year. Will start [place]. ID consulted and now managing her medications. 3. Metabolic acidosis - due to CKD, resolved with oral bicarb 4. HTN- stable 5. Enteropathogenic E. Coli- on 3 day course of azithromycin per primary svc 6. Pancytopenia- per ID. 7. SHPTH- on binders and will follow. 8. Vascular access- s/p Left forearm Acuseal AVG placed 07/04/20. Can be used in the next 24 hours, 17 gauge needles, BFR <250 for first 2 post-operative weeks, and low dose heparin should be used. 10-15 minutes of pressure after decannulating. Donetta Potts, MD [employer] [phone]

## 2020-07-09 NOTE — Progress Notes (Addendum)
Patient concern about arm is cooler to touch after EMLA application 30 mins ago. Pt cap refill is greater than 3 sec. Ensure that the gauze wrapped around the arm is two fingers. Patient says "its hurting more". Gave prn 10mg  PO oxycodone. Paged [place] to notify.

## 2020-07-09 NOTE — Progress Notes (Signed)
PROGRESS NOTE  Brooke Baldwin  DOB: 1976-01-10  PCP: Patient, No Pcp Per TDS:[id number]  DOA: 06/30/2020  LOS: 9 days   Chief Complaint  Patient presents with  . Abnormal Lab    Brief narrative: Brooke Baldwin is a 44 yo female with PMH CKD (progressive decline, currently stage V), HIV (from [place], no prior records), HTN, trigeminal neuralgia. On 11/25, patient presented to the ED with worsening renal function. She has been followed closely outpatient with nephrology and found to have deteriorating renal function. She has undergone work-up in the past including renal biopsy.  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   She was admitted for fluids and close monitoring in case of need for starting on dialysis inpatient.  She underwent repeat HIV labs outpatient with nephrology, however records unable to be viewed in epic.  ID clinic has also not restarted on treatment due to awaiting records from [place].  Labs were again ordered during this hospitalization to further confirm and guide treatment.  She states that she has been living here for approximately 2 years and has been out of treatment meds for approximately 1 year.  Previously was on Tivicay and another.  Per nephro notes, Cd4 was 5 on recent office check.   HIV labs returned and were consistent with confirming diagnosis as well as significantly low CD4 count (less than 35).  She was evaluated by infectious disease and started on Biktarvy.  Atovaquone was started for PJP prophylaxis.  She was started on IV fluids and a bicarb drip on admission. Renal response/improvement was minimal. Due to her rapid progression of renal decline, nephrology recommended patient undergo VVS consultation with plans for initiation of HD in the next several days.  She underwent placement of left forearm Gore-Tex AV graft on 07/04/2020.  Subjective: Patient was seen and examined this morning. Complains of continued  pain on the left forearm.  Noted that dialysis has been postponed.  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 -Minimal response to IV fluid. -VVS consulted; patient underwent left AV graft placement on 07/04/2020. Patient has discomfort at the site of AV graft placement. Vascular surgery evaluated for possible steal syndrome. Patient now also has significant development of blisters on the forearm. Per nephrology, will hold off on cannulating the graft and apply ice to forearm. She does not have any emergent need of dialysis at this time.  Defer to nephrology for the timing of dialysis initiation.  Noted dialysis has been postponed for tomorrow or Monday.  Elevated blood pressure  -Intermittently elevated blood pressure with no history of hypertension.   -Continue to monitor.   -For now, use PRN labetalol or hydralazine  HIV (human immunodeficiency virus infection) ([place]) - there has been difficulty confirming and obtaining CD4 count and VL outpatient - last CD4 is 5 per nephrology notes from check in office outpatient; unable to view records and ID clinic has not received records from [place] consult appreciated. 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  GI pathogen panel noted enteropathogenic E. coli.   -3-day course of azithromycin completed on 12/1.  Diarrhea improved.  Neutropenia  -Nontoxic-appearing and low suspicion for infection at this time.  Possibly due to immunosuppression from HIV and/or GI illness -Continue monitoring otherwise Recent Labs  Lab 07/03/20 0402 07/04/20 0233 07/05/20 0426 07/06/20 0040  WBC 2.2* 2.1* 2.9* 3.9*   Secondary  hyperparathyroidism -Continue sevelamer per nephrology -Continue renal diet  Hypoalbuminemia -Level of 1 on admission with goal of 2.5 per nephrology -currently on albumin 25 g BID until goal reached -Albumin is 1.6 on blood work today 11/30.  Right trigeminal neuralgia -Not on meds  Mobility: Encourage ambulation Code Status:   Code Status: Full Code  Nutritional status: Body mass index is 39.84 kg/m.     Diet Order            Diet renal with fluid restriction Fluid restriction: 1200 mL Fluid; Room service appropriate? Yes; Fluid consistency: Thin  Diet effective now                 DVT prophylaxis: heparin injection 5,000 Units Start: 06/30/20 1400 SCDs Start: 06/30/20 1314   Antimicrobials:  HIV meds Fluid: No fluid Consultants: Nephrology, vascular surgery Family Communication:  Friend at bedside  Status is: Inpatient  Remains inpatient appropriate because: Waiting for initiation of new dialysis  Dispo: The patient is from: Home              Anticipated d/c is to: Home              Anticipated d/c date is: More than 3 days              Patient currently is not medically stable to d/c.   Infusions:    Scheduled Meds: . acetaminophen  650 mg Oral Q6H  . atovaquone  1,500 mg Oral Q breakfast  . bictegravir-emtricitabine-tenofovir AF  1 tablet Oral Daily  . Chlorhexidine Gluconate Cloth  6 each Topical Q0600  . heparin  5,000 Units Subcutaneous Q8H  . kidney failure book   Does not apply Once  . multivitamin  1 tablet Oral QHS  . sevelamer carbonate  800 mg Oral TID WC  . sodium bicarbonate  1,300 mg Oral TID    Antimicrobials: Anti-infectives (From admission, onward)   Start     Dose/Rate Route Frequency Ordered Stop   07/05/20 0000  bictegravir-emtricitabine-tenofovir AF (BIKTARVY) 50-200-25 MG TABS tablet        1 tablet Oral Daily 07/05/20 1409     07/04/20 0000  vancomycin (VANCOCIN) IVPB 1000 mg/200 mL premix        1,000 mg 200 mL/hr over  60 Minutes Intravenous To Surgery 07/03/20 0854 07/04/20 0755   07/03/20 1530  azithromycin (ZITHROMAX) tablet 500 mg        500 mg Oral Daily 07/03/20 1435 07/06/20 0959   07/03/20 1015  bictegravir-emtricitabine-tenofovir AF (BIKTARVY) 50-200-25 MG per tablet 1 tablet        1 tablet Oral Daily 07/03/20 0949     07/02/20 1000  atovaquone (MEPRON) 750 MG/5ML suspension 1,500 mg        1,500 mg Oral Daily with breakfast 07/02/20 0901        PRN meds: calcium carbonate (dosed in mg elemental calcium), camphor-menthol **AND** hydrOXYzine, dextromethorphan, docusate sodium, feeding supplement (NEPRO CARB STEADY), hydrALAZINE, labetalol, loperamide, menthol-cetylpyridinium, morphine injection, ondansetron **OR** ondansetron (ZOFRAN) IV, oxyCODONE, sorbitol, zolpidem   Objective: Vitals:   07/09/20 0737 07/09/20 1054  BP: (!) 141/90 132/90  Pulse: 93 (!) 102  Resp: 20 20  Temp: 98.6 F (37 C) 98.4 F (36.9 C)  SpO2: 99% 97%  Intake/Output Summary (Last 24 hours) at 07/09/2020 1246 Last data filed at 07/09/2020 0800 Gross per 24 hour  Intake 600 ml  Output 350 ml  Net 250 ml   Filed Weights   07/07/20 0433 07/08/20 0449 07/09/20 0504  Weight: 103.9 kg 99.2 kg 98.8 kg   Weight change: -0.454 kg Body mass index is 39.84 kg/m.   Physical Exam: General exam: Not in physical distress Skin: No rashes, lesions or ulcers. HEENT: Atraumatic, normocephalic, no obvious bleeding Lungs: Clear to auscultate bilaterally CVS: Regular rate and rhythm, no murmur GI/Abd soft, nontender, nondistended, bowel sound present CNS: Alert, awake monitor x3 Psychiatry: Mood appropriate Extremities: Left forearm AV graft site remains swollen.  Blisters are not worsening.  Data Review: I have personally reviewed the laboratory data and studies available.  Recent Labs  Lab 07/03/20 0402 07/04/20 0233 07/05/20 0426 07/06/20 0040  WBC 2.2* 2.1* 2.9* 3.9*  NEUTROABS 1.1* 1.1* 1.5* 2.2  HGB 9.7*  11.3* 10.5* 11.4*  HCT 28.3* 33.8* 32.7* 36.2  MCV 84.5 85.6 88.9 90.5  PLT 137* 124* 104* 122*   Recent Labs  Lab 07/03/20 0402 07/03/20 0402 07/04/20 0233 07/04/20 0233 07/05/20 0426 07/05/20 0426 07/05/20 0855 07/06/20 0040 07/07/20 0439 07/08/20 0321 07/09/20 0317  NA 145   < > 146*   < > 142   < > 138 141 138 138 142  K 2.9*   < > 4.0   < > 4.2   < > 4.5 4.4 4.6 4.4 4.7  CL 108   < > 112*   < > 107   < > 106 105 104 102 103  CO2 25   < > 22   < > 21*   < > 23 23 21* 21* 21*  GLUCOSE 94   < > 84   < > 86   < > 119* 89 78 81 70  BUN 59*   < > 55*   < > 60*   < > 59* 61* 66* 68* 75*  CREATININE 9.16*   < > 9.82*   < > 10.51*   < > 10.48* 10.99* 11.80* 12.32* 12.37*  CALCIUM 6.6*   < > 7.2*   < > 7.5*   < > 7.4* 7.7* 7.8* 7.6* 7.9*  MG 1.3*  --  1.8  --  1.7  --   --  1.7  --   --   --   PHOS 5.6*   < > 5.9*   < > 6.2*   < > 5.8* 7.3* 8.8* 8.5* 8.8*   < > = values in this interval not displayed.    F/u labs ordered  Signed, Terrilee Croak, MD [facility] 07/09/2020

## 2020-07-09 NOTE — Progress Notes (Signed)
Patient had concerns about her first dialysis treatment. Patient states she is nervous and asks about if there is any pain. RN provided reassurance and informs the patient that the RN will describe everything that is going on during the dialysis and that each patient feels and reacts to dialysis different post dialysis. Called [facility] to see what round the patient is on and inform the patient that she is on round 2 roster and will be going after lunch.

## 2020-07-10 DIAGNOSIS — I120 Hypertensive chronic kidney disease with stage 5 chronic kidney disease or end stage renal disease: Secondary | ICD-10-CM | POA: Diagnosis not present

## 2020-07-10 DIAGNOSIS — R000 Tachycardia, unspecified: Secondary | ICD-10-CM | POA: Diagnosis not present

## 2020-07-10 DIAGNOSIS — N185 Chronic kidney disease, stage 5: Secondary | ICD-10-CM | POA: Diagnosis not present

## 2020-07-10 DIAGNOSIS — N179 Acute kidney failure, unspecified: Secondary | ICD-10-CM | POA: Diagnosis not present

## 2020-07-10 LAB — RENAL FUNCTION PANEL
Albumin: 1.3 g/dL — ABNORMAL LOW (ref 3.5–5.0)
Anion gap: 14 (ref 5–15)
BUN: 77 mg/dL — ABNORMAL HIGH (ref 6–20)
CO2: 23 mmol/L (ref 22–32)
Calcium: 7.9 mg/dL — ABNORMAL LOW (ref 8.9–10.3)
Chloride: 104 mmol/L (ref 98–111)
Creatinine, Ser: 13.1 mg/dL — ABNORMAL HIGH (ref 0.44–1.00)
GFR, Estimated: 3 mL/min — ABNORMAL LOW (ref >60)
Glucose, Bld: 94 mg/dL (ref 70–99)
Phosphorus: 9 mg/dL — ABNORMAL HIGH (ref 2.5–4.6)
Potassium: 4.1 mmol/L (ref 3.5–5.1)
Sodium: 141 mmol/L (ref 135–145)

## 2020-07-10 MED ORDER — HEPARIN SODIUM (PORCINE) 1000 UNIT/ML DIALYSIS: 1000.0000 [IU] | INTRAMUSCULAR | Status: DC | PRN

## 2020-07-10 MED ORDER — ALTEPLASE 2 MG IJ SOLR: 2.0000 mg | Freq: Once | INTRAMUSCULAR | Status: DC | PRN

## 2020-07-10 MED ORDER — PENTAFLUOROPROP-TETRAFLUOROETH EX AERO: 1.0000 "application " | INHALATION_SPRAY | CUTANEOUS | Status: DC | PRN

## 2020-07-10 MED ORDER — SODIUM CHLORIDE 0.9 % IV SOLN: 100.0000 mL | INTRAVENOUS | Status: DC | PRN

## 2020-07-10 MED ORDER — LIDOCAINE HCL (PF) 1 % IJ SOLN: 5.0000 mL | INTRAMUSCULAR | Status: DC | PRN

## 2020-07-10 MED ORDER — HEPARIN SODIUM (PORCINE) 1000 UNIT/ML DIALYSIS: 20.0000 [IU]/kg | INTRAMUSCULAR | Status: DC | PRN

## 2020-07-10 MED ORDER — LIDOCAINE-PRILOCAINE 2.5-2.5 % EX CREA: 1.0000 "application " | TOPICAL_CREAM | CUTANEOUS | Status: DC | PRN

## 2020-07-10 NOTE — Progress Notes (Signed)
   07/10/20 1614  Vitals  Temp 99.2 F (37.3 C)  Temp Source Oral  BP 115/84  MAP (mmHg) 91  BP Location Right Arm  BP Method Automatic  Patient Position (if appropriate) Sitting  Pulse Rate (!) 124  Pulse Rate Source Dinamap  Resp 18  Level of Consciousness  Level of Consciousness Alert  MEWS COLOR  MEWS Score Color Yellow  Oxygen Therapy  SpO2 97 %  O2 Device Room Air  Pain Assessment  Pain Scale 0-10  Pain Score 8  Pain Type Acute pain  Pain Location Arm  Pain Orientation Left  MEWS Score  MEWS Temp 0  MEWS Systolic 0  MEWS Pulse 2  MEWS RR 0  MEWS LOC 0  MEWS Score 2  No change in status. MD notified. Will continue to monitor.

## 2020-07-10 NOTE — Progress Notes (Signed)
   07/10/20 1450  Assess: MEWS Score  Temp 98.2 F (36.8 C)  BP 128/88  ECG Heart Rate (!) 113  Resp 16  Level of Consciousness Alert  SpO2 95 %  O2 Device Room Air  Assess: MEWS Score  MEWS Temp 0  MEWS Systolic 0  MEWS Pulse 2  MEWS RR 0  MEWS LOC 0  MEWS Score 2  MEWS Score Color Yellow  Assess: if the MEWS score is Yellow or Red  Were vital signs taken at a resting state? Yes  Focused Assessment No change from prior assessment  Early Detection of Sepsis Score *See Row Information* Low  MEWS guidelines implemented *See Row Information* No, vital signs rechecked  Treat  Pain Scale 0-10  Pain Score 8  Pain Type Acute pain  Pain Location Arm  Pain Orientation Left  Pain Intervention(s) Medication (See eMAR)  Patient's heart rate increased during dialysis and has remained elevated since returning to the unit.  Will continue to monitor

## 2020-07-10 NOTE — Progress Notes (Signed)
PROGRESS NOTE  Marjean Imperato  DOB: Feb 08, 1976  PCP: Patient, No Pcp Per CNO:[id number]  DOA: 06/30/2020  LOS: 10 days   Chief Complaint  Patient presents with  . Abnormal Lab    Brief narrative: Ms. Napoles is a 44 yo female with PMH CKD (progressive decline, currently stage V), HIV (from [place], no prior records), HTN, trigeminal neuralgia. On 11/25, patient presented to the ED with worsening renal function. She has been followed closely outpatient with nephrology and found to have deteriorating renal function. She has undergone work-up in the past including renal biopsy.  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   She was admitted for fluids and close monitoring in case of need for starting on dialysis inpatient.  She underwent repeat HIV labs outpatient with nephrology, however records unable to be viewed in epic.  ID clinic has also not restarted on treatment due to awaiting records from [place].  Labs were again ordered during this hospitalization to further confirm and guide treatment.  She states that she has been living here for approximately 2 years and has been out of treatment meds for approximately 1 year.  Previously was on Tivicay and another.  Per nephro notes, Cd4 was 5 on recent office check.   HIV labs returned and were consistent with confirming diagnosis as well as significantly low CD4 count (less than 35).  She was evaluated by infectious disease and started on Biktarvy.  Atovaquone was started for PJP prophylaxis.  She was started on IV fluids and a bicarb drip on admission. Renal response/improvement was minimal. Due to her rapid progression of renal decline, nephrology recommended patient undergo VVS consultation with plans for initiation of HD in the next several days.  She underwent placement of left forearm Gore-Tex AV graft on 07/04/2020.  Subjective: Patient was seen and examined this morning. Lying on bed.  Not in  distress.   Continue of cellulitis of the left arm.  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 -Minimal response to IV fluid. -VVS consulted; patient underwent left AV graft placement on 07/04/2020.  Postsurgically, patient started having swelling, redness and blister formation around the surgical site.  Nephrology refrain from cannulating the site for several days.  Noted a plan to try today after application of cream.  If not successful, patient may need temporary dialysis catheter.  Elevated blood pressure  -Intermittently elevated blood pressure with no history of hypertension.   -Continue to monitor.   -For now, use PRN labetalol or hydralazine  HIV (human immunodeficiency virus infection) ([place]) - there has been difficulty confirming and obtaining CD4 count and VL outpatient - last CD4 is 5 per nephrology notes from check in office outpatient; unable to view records and ID clinic has not received records from [place] consult appreciated. 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 GI pathogen panel noted enteropathogenic E. coli.   -3-day course of azithromycin completed on 12/1.  Diarrhea improved.  Neutropenia  -Nontoxic-appearing and low suspicion for infection at this time.  Possibly due to immunosuppression from HIV and/or GI illness -Continue monitoring otherwise Recent Labs  Lab 07/04/20 0233 07/05/20  4132 07/06/20 0040  WBC 2.1* 2.9* 3.9*   Secondary hyperparathyroidism -Continue sevelamer per nephrology -Continue renal diet  Hypoalbuminemia -Level of 1 on admission with goal of 2.5 per nephrology -currently  on albumin 25 g BID until goal reached -Albumin is 1.6 on blood work today 11/30.  Right trigeminal neuralgia -Not on meds  Mobility: Encourage ambulation Code Status:   Code Status: Full Code  Nutritional status: Body mass index is 40.97 kg/m.     Diet Order            Diet renal with fluid restriction Fluid restriction: 1200 mL Fluid; Room service appropriate? Yes; Fluid consistency: Thin  Diet effective now                 DVT prophylaxis: heparin injection 5,000 Units Start: 06/30/20 1400 SCDs Start: 06/30/20 1314   Antimicrobials:  HIV meds Fluid: No fluid Consultants: Nephrology, vascular surgery Family Communication:  Friend at bedside  Status is: Inpatient  Remains inpatient appropriate because: Waiting for initiation of new dialysis  Dispo: The patient is from: Home              Anticipated d/c is to: Home              Anticipated d/c date is: More than 3 days              Patient currently is not medically stable to d/c.   Infusions:  . sodium chloride    . sodium chloride      Scheduled Meds: . acetaminophen  650 mg Oral Q6H  . atovaquone  1,500 mg Oral Q breakfast  . bictegravir-emtricitabine-tenofovir AF  1 tablet Oral Daily  . Chlorhexidine Gluconate Cloth  6 each Topical Q0600  . heparin  5,000 Units Subcutaneous Q8H  . kidney failure book   Does not apply Once  . multivitamin  1 tablet Oral QHS  . sevelamer carbonate  800 mg Oral TID WC  . sodium bicarbonate  1,300 mg Oral TID    Antimicrobials: Anti-infectives (From admission, onward)   Start     Dose/Rate Route Frequency Ordered Stop   07/05/20 0000  bictegravir-emtricitabine-tenofovir AF (BIKTARVY) 50-200-25 MG TABS tablet        1 tablet Oral Daily 07/05/20 1409     07/04/20 0000  vancomycin (VANCOCIN) IVPB 1000 mg/200 mL premix        1,000 mg 200 mL/hr over 60 Minutes Intravenous To Surgery 07/03/20 0854 07/04/20 0755   07/03/20 1530  azithromycin (ZITHROMAX) tablet 500 mg         500 mg Oral Daily 07/03/20 1435 07/06/20 0959   07/03/20 1015  bictegravir-emtricitabine-tenofovir AF (BIKTARVY) 50-200-25 MG per tablet 1 tablet        1 tablet Oral Daily 07/03/20 0949     07/02/20 1000  atovaquone (MEPRON) 750 MG/5ML suspension 1,500 mg        1,500 mg Oral Daily with breakfast 07/02/20 0901        PRN meds: sodium chloride, sodium chloride, alteplase, calcium carbonate (dosed in mg elemental calcium), camphor-menthol **AND** hydrOXYzine, dextromethorphan, docusate sodium, feeding supplement (NEPRO CARB STEADY), heparin, heparin, hydrALAZINE, labetalol, lidocaine (PF), lidocaine-prilocaine, loperamide, menthol-cetylpyridinium, morphine injection, ondansetron **OR** ondansetron (ZOFRAN) IV, oxyCODONE, pentafluoroprop-tetrafluoroeth, sorbitol, zolpidem   Objective: Vitals:   07/10/20 1330 07/10/20 1400  BP: (!) 121/95 109/83  Pulse:    Resp: 20 20  Temp:    SpO2:      Intake/Output Summary (Last 24 hours)  at 07/10/2020 1419 Last data filed at 07/10/2020 9179 Gross per 24 hour  Intake 480 ml  Output 601 ml  Net -121 ml   Filed Weights   07/10/20 0025 07/10/20 0500 07/10/20 1130  Weight: 98.5 kg 98.5 kg 101.6 kg   Weight change: -0.293 kg Body mass index is 40.97 kg/m.   Physical Exam: General exam: Not in physical distress Skin: No rashes, lesions or ulcers. HEENT: Atraumatic, normocephalic, no obvious bleeding Lungs: Clear to auscultation bilaterally CVS: Regular rate and rhythm, no murmur GI/Abd soft, nontender, nondistended, bowel sound present CNS: Alert, awake monitor x3 Psychiatry: Mood appropriate Extremities: Left forearm AV graft site remains swollen.  Blisters are not worsening.  Data Review: I have personally reviewed the laboratory data and studies available.  Recent Labs  Lab 07/04/20 0233 07/05/20 0426 07/06/20 0040  WBC 2.1* 2.9* 3.9*  NEUTROABS 1.1* 1.5* 2.2  HGB 11.3* 10.5* 11.4*  HCT 33.8* 32.7* 36.2  MCV 85.6 88.9 90.5   PLT 124* 104* 122*   Recent Labs  Lab 07/04/20 0233 07/04/20 0233 07/05/20 0426 07/05/20 0855 07/06/20 0040 07/07/20 0439 07/08/20 0321 07/09/20 0317 07/10/20 0148  NA 146*   < > 142   < > 141 138 138 142 141  K 4.0   < > 4.2   < > 4.4 4.6 4.4 4.7 4.1  CL 112*   < > 107   < > 105 104 102 103 104  CO2 22   < > 21*   < > 23 21* 21* 21* 23  GLUCOSE 84   < > 86   < > 89 78 81 70 94  BUN 55*   < > 60*   < > 61* 66* 68* 75* 77*  CREATININE 9.82*   < > 10.51*   < > 10.99* 11.80* 12.32* 12.37* 13.10*  CALCIUM 7.2*   < > 7.5*   < > 7.7* 7.8* 7.6* 7.9* 7.9*  MG 1.8  --  1.7  --  1.7  --   --   --   --   PHOS 5.9*   < > 6.2*   < > 7.3* 8.8* 8.5* 8.8* 9.0*   < > = values in this interval not displayed.    F/u labs ordered  Signed, Terrilee Croak, MD [facility] 07/10/2020

## 2020-07-10 NOTE — Progress Notes (Signed)
Patient ID: Brooke Baldwin, female   DOB: 08-Jan-1976, 44 y.o.   MRN: [id number] S: No new complaints today. O:BP (!) 134/95 (BP Location: Right Arm)   Pulse (!) 109   Temp 98 F (36.7 C) (Oral)   Resp 18   Ht 5\' 2"  (1.575 m)   Wt 98.5 kg   LMP 04/02/2015 (Approximate)   SpO2 95%   BMI 39.72 kg/m   Intake/Output Summary (Last 24 hours) at 07/10/2020 1117 Last data filed at 07/10/2020 0611 Gross per 24 hour  Intake 480 ml  Output 601 ml  Net -121 ml   Intake/Output: I/O last 3 completed shifts: In: 59 [P.O.:960] Out: 951 [Urine:950; Stool:1]  Intake/Output this shift:  No intake/output data recorded. Weight change: -0.293 kg Gen: NAD CVS: tachy at 109 Resp: cta Abd: +BS, soft, NT/ND Ext: 1+ pretibial edema, L forearm AVG +T/B with some edema and tenderness, no change in blisters, left hand warm.  Recent Labs  Lab 07/04/20 0233 07/04/20 0233 07/05/20 0426 07/05/20 0855 07/06/20 0040 07/07/20 0439 07/08/20 0321 07/09/20 0317 07/10/20 0148  NA 146*   < > 142 138 141 138 138 142 141  K 4.0   < > 4.2 4.5 4.4 4.6 4.4 4.7 4.1  CL 112*   < > 107 106 105 104 102 103 104  CO2 22   < > 21* 23 23 21* 21* 21* 23  GLUCOSE 84   < > 86 119* 89 78 81 70 94  BUN 55*   < > 60* 59* 61* 66* 68* 75* 77*  CREATININE 9.82*   < > 10.51* 10.48* 10.99* 11.80* 12.32* 12.37* 13.10*  ALBUMIN 1.6*  1.6*   < > 1.4* 1.6* 1.5* 1.4* 1.3* 1.2* 1.3*  CALCIUM 7.2*   < > 7.5* 7.4* 7.7* 7.8* 7.6* 7.9* 7.9*  PHOS 5.9*   < > 6.2* 5.8* 7.3* 8.8* 8.5* 8.8* 9.0*  AST 82*  --   --   --   --   --   --   --   --   ALT 42  --   --   --   --   --   --   --   --    < > = values in this interval not displayed.   Liver Function Tests: Recent Labs  Lab 07/04/20 0233 07/05/20 0426 07/08/20 0321 07/09/20 0317 07/10/20 0148  AST 82*  --   --   --   --   ALT 42  --   --   --   --   ALKPHOS 67  --   --   --   --   BILITOT 0.8  --   --   --   --   PROT 4.0*  --   --   --   --   ALBUMIN 1.6*  1.6*   <  > 1.3* 1.2* 1.3*   < > = values in this interval not displayed.   No results for input(s): LIPASE, AMYLASE in the last 168 hours. No results for input(s): AMMONIA in the last 168 hours. CBC: Recent Labs  Lab 07/04/20 0233 07/05/20 0426 07/06/20 0040  WBC 2.1* 2.9* 3.9*  NEUTROABS 1.1* 1.5* 2.2  HGB 11.3* 10.5* 11.4*  HCT 33.8* 32.7* 36.2  MCV 85.6 88.9 90.5  PLT 124* 104* 122*   Cardiac Enzymes: No results for input(s): CKTOTAL, CKMB, CKMBINDEX, TROPONINI in the last 168 hours. CBG: No results for input(s): GLUCAP in the  last 168 hours.  Iron Studies: No results for input(s): IRON, TIBC, TRANSFERRIN, FERRITIN in the last 72 hours. Studies/Results: No results found. Marland Kitchen acetaminophen  650 mg Oral Q6H  . atovaquone  1,500 mg Oral Q breakfast  . bictegravir-emtricitabine-tenofovir AF  1 tablet Oral Daily  . Chlorhexidine Gluconate Cloth  6 each Topical Q0600  . heparin  5,000 Units Subcutaneous Q8H  . kidney failure book   Does not apply Once  . multivitamin  1 tablet Oral QHS  . sevelamer carbonate  800 mg Oral TID WC  . sodium bicarbonate  1,300 mg Oral TID    BMET    Component Value Date/Time   NA 141 07/10/2020 0148   NA 139 12/09/2019 0954   K 4.1 07/10/2020 0148   CL 104 07/10/2020 0148   CO2 23 07/10/2020 0148   GLUCOSE 94 07/10/2020 0148   BUN 77 (H) 07/10/2020 0148   BUN 21 12/15/2019 0848   CREATININE 13.10 (H) 07/10/2020 0148   CALCIUM 7.9 (L) 07/10/2020 0148   CALCIUM 7.0 (L) 06/30/2020 1459   GFRNONAA 3 (L) 07/10/2020 0148   GFRAA 33 (L) 12/15/2019 0848   CBC    Component Value Date/Time   WBC 3.9 (L) 07/06/2020 0040   RBC 4.00 07/06/2020 0040   HGB 11.4 (L) 07/06/2020 0040   HGB 14.1 12/09/2019 0954   HCT 36.2 07/06/2020 0040   HCT 41.3 12/09/2019 0954   PLT 122 (L) 07/06/2020 0040   MCV 90.5 07/06/2020 0040   MCV 86 12/09/2019 0954   MCH 28.5 07/06/2020 0040   MCHC 31.5 07/06/2020 0040   RDW 15.4 07/06/2020 0040   RDW 13.7 12/09/2019 0954    LYMPHSABS 1.0 07/06/2020 0040   LYMPHSABS 1.1 12/09/2019 0954   MONOABS 0.4 07/06/2020 0040   EOSABS 0.3 07/06/2020 0040   EOSABS 0.2 12/09/2019 0954   BASOSABS 0.0 07/06/2020 0040   BASOSABS 0.0 12/09/2019 0954    Assessment/Plan:  1. 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 S/p acuseal LAVG and can be used in the next 24-48 hours. 1. No emergent need for dialysis so will try to waitas long as possiblebefore cannulating the Acuseal AVGwhich unfortunately is causing some discomfort.  2. VVS evaluating for possible steal syndrome but appears to be slowly improving. 3. Continues to have some induration and blisters on forearm, VVS following.  4. Will attempt first cannulation today and use ethyl chloride spray as she couldn't tolerate EMLA cream for unclear reasons.  5. If she cannot tolerate cannulation then will require TDC afterall.  2. HIV/AIDS- has not been well controlled and not on ART for over a year. Will start [place]. ID consulted and now managing her medications. 3. Metabolic acidosis - due to CKD, resolved with oral bicarb 4. HTN- stable 5. Enteropathogenic E. Coli- on 3 day course of azithromycin per primary svc 6. Pancytopenia- per ID. 7. SHPTH- on binders and will follow. 8. Vascular access- s/p Left forearm Acuseal AVG placed 07/04/20. Can be used anytime, 17 gauge needles, BFR <250 for first 2 post-operative weeks, and low dose heparin should be used. 10-15 minutes of pressure after decannulating.  Donetta Potts, MD [employer] [phone]

## 2020-07-11 DIAGNOSIS — N179 Acute kidney failure, unspecified: Secondary | ICD-10-CM | POA: Diagnosis not present

## 2020-07-11 DIAGNOSIS — N185 Chronic kidney disease, stage 5: Secondary | ICD-10-CM | POA: Diagnosis not present

## 2020-07-11 LAB — RENAL FUNCTION PANEL
Albumin: 1.3 g/dL — ABNORMAL LOW (ref 3.5–5.0)
Anion gap: 12 (ref 5–15)
BUN: 53 mg/dL — ABNORMAL HIGH (ref 6–20)
CO2: 26 mmol/L (ref 22–32)
Calcium: 7.9 mg/dL — ABNORMAL LOW (ref 8.9–10.3)
Chloride: 101 mmol/L (ref 98–111)
Creatinine, Ser: 10.16 mg/dL — ABNORMAL HIGH (ref 0.44–1.00)
GFR, Estimated: 4 mL/min — ABNORMAL LOW (ref >60)
Glucose, Bld: 87 mg/dL (ref 70–99)
Phosphorus: 6.5 mg/dL — ABNORMAL HIGH (ref 2.5–4.6)
Potassium: 4 mmol/L (ref 3.5–5.1)
Sodium: 139 mmol/L (ref 135–145)

## 2020-07-11 NOTE — Care Plan (Signed)
Patient has follow-up appointment with Dr. Stanford Breed arranged per appointment tab on 08/02/2020 at 1pm.

## 2020-07-11 NOTE — Progress Notes (Signed)
Renal Navigator notified by Dr. Lurlean Horns that patient needs referral for outpatient HD for ESRD. Navigator submitted referral to [facility] to request clinic closest to patient's address, as patient followed with [facility] prior to admissions and will follow closely. Navigator will meet with patient once seat secured.   Alphonzo Cruise, [place] Renal Navigator [phone]

## 2020-07-11 NOTE — Progress Notes (Addendum)
  Postoperative hemodialysis access     Date of Surgery:  07/04/2020 Surgeon: Stanford Breed  Subjective:  Pt says pain is getting better.  Still with some numbness in hand but she says this has gotten a little better.  Says they used the graft yesterday and it went well and that she slept through dialysis.   PHYSICAL EXAMINATION:  Vitals:   07/10/20 2050 07/11/20 0336  BP: (!) 120/93 118/89  Pulse: (!) 110 (!) 104  Resp: 20 20  Temp: 99.1 F (37.3 C) 98.6 F (37 C)  SpO2: 97% 98%    Incisions are clean with steri strips in place Sensation in digits is intact;  There is bruit. There is a brisk left radial > ulnar doppler signal present.  Radial signal seems improved from last week.  Hand grip still weak.     ASSESSMENT/PLAN:  Brooke Baldwin is a 44 y.o. year old female who is s/p left FA acuseal loop graft..  -pt still with some numbness in her left hand but no pain & weak hand grip.  Pt feels this is getting better.   Arm is still edematous but they were able to use her graft yesterday without difficulty per pt.   -left radial doppler signals seems improved from last week.    Leontine Locket, PA-C [facility] [phone]  VASCULAR STAFF ADDENDUM: I have independently interviewed and examined the patient. I agree with the above.  Continues to improve. Tolerated using the graft yesterday. Hopefully this will work well for her long term.  Follow up with me in 4 weeks with graft duplex. Please call for questions.  [name]. Stanford Breed, MD [facility] Phone Number: [phone] 07/11/2020 8:46 AM

## 2020-07-11 NOTE — Care Management Important Message (Signed)
Important Message  Patient Details  Name: Brooke Baldwin MRN: [id number] Date of Birth: [birth year]   Medicare Important Message Given:  Yes     [name] 07/11/2020, 9:07 AM

## 2020-07-11 NOTE — Progress Notes (Addendum)
[facility] Progress Note    Assessment/ Plan:   1. 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 S/p acuseal LAVG and can be used in the next 24-48 hours. 1. No emergent need for dialysis so will try to waitas long as possiblebefore cannulating the Acuseal AVGwhich unfortunately is causing some discomfort.  2. VVS evaluating for possible steal syndrome but appears to be slowly improving. 3. Continues to have some induration and blisters on forearm, VVS following. 4. First cannulation 12/5 and use ethyl chloride spray as she couldn't tolerate EMLA cream for unclear reasons. Patient appeared to have done well with this. Swelling and tenderness around vascular access site improved. 5. Will hold off on dialysis today and will do dialysis tomorrow and the following day if she continues to tolerate it well. Patient agreeable with this plan.  2. HIV/AIDS- has not been well controlled and not on ART for over a year. Started Biktarvy and atovaquone. ID  now managing her medications. 3. Metabolic acidosis - due to CKD, resolved with dialysis -> stop oral bicarb 4. HTN- stable 5. Enteropathogenic E. Coli- on 3 day course of azithromycin per primary svc 6. Pancytopenia- per ID. 7. SHPTH- on binders and will follow. 8. Vascular access- s/p Left forearm Acuseal AVG placed 07/04/20. Can be used anytime, 17 gauge needles, BFR <250 for first 2 post-operative weeks, and low dose heparin should be used. 10-15 minutes of pressure after decannulating.  Subjective:   Patient reports that she feels like her arm is feeling better, able to move it more. Still endorses some numbness in her hand however feels like this is improving as well.    Objective:   BP 118/89 (BP Location: Right Arm)   Pulse (!) 104   Temp 98.6 F (37 C) (Oral)   Resp 20   Ht 5\' 2"  (1.575 m)   Wt 98.2 kg Comment: scale c  LMP 04/02/2015 (Approximate)   SpO2 98%   BMI 39.60  kg/m   Intake/Output Summary (Last 24 hours) at 07/11/2020 0830 Last data filed at 07/10/2020 1749 Gross per 24 hour  Intake 480 ml  Output 500 ml  Net -20 ml   Weight change: 3.1 kg  Physical Exam: General: Middle aged female, NAD, laying in bed Cardiac: RRR, no m/r/g Pulmonary: CTABL, no wheezing, rhonchi, rales Abdomen: Soft, non-tender, non-distended Extremities: 1+ BL LE pitting edema, L forearm AVG, mild erythema and tenderness to palpation  Imaging: No results found.  Labs: BMET Recent Labs  Lab 07/05/20 0855 07/06/20 0040 07/07/20 0439 07/08/20 0321 07/09/20 0317 07/10/20 0148 07/11/20 0507  NA 138 141 138 138 142 141 139  K 4.5 4.4 4.6 4.4 4.7 4.1 4.0  CL 106 105 104 102 103 104 101  CO2 23 23 21* 21* 21* 23 26  GLUCOSE 119* 89 78 81 70 94 87  BUN 59* 61* 66* 68* 75* 77* 53*  CREATININE 10.48* 10.99* 11.80* 12.32* 12.37* 13.10* 10.16*  CALCIUM 7.4* 7.7* 7.8* 7.6* 7.9* 7.9* 7.9*  PHOS 5.8* 7.3* 8.8* 8.5* 8.8* 9.0* 6.5*   CBC Recent Labs  Lab 07/05/20 0426 07/06/20 0040  WBC 2.9* 3.9*  NEUTROABS 1.5* 2.2  HGB 10.5* 11.4*  HCT 32.7* 36.2  MCV 88.9 90.5  PLT 104* 122*    Medications:    . acetaminophen  650 mg Oral Q6H  . atovaquone  1,500 mg Oral Q breakfast  . bictegravir-emtricitabine-tenofovir AF  1 tablet Oral Daily  .  Chlorhexidine Gluconate Cloth  6 each Topical Q0600  . heparin  5,000 Units Subcutaneous Q8H  . kidney failure book   Does not apply Once  . multivitamin  1 tablet Oral QHS  . sevelamer carbonate  800 mg Oral TID WC  . sodium bicarbonate  1,300 mg Oral TID    Asencion Noble, M.D. PGY3 07/11/2020 8:30 AM  I have seen and examined this patient and agree with the plan of care. Spoke with pt and she did not want RRT today but is agreeable to Tues and Wed to complete her 1st 3 treatments. Renal navigator aware of pt and trying to place.  Dwana Melena, MD 07/11/2020, 9:05 AM

## 2020-07-11 NOTE — Progress Notes (Signed)
PROGRESS NOTE  Brooke Baldwin  DOB: 1976-05-09  PCP: Patient, No Pcp Per BOF:[id number]  DOA: 06/30/2020  LOS: 11 days   Chief Complaint  Patient presents with  . Abnormal Lab    Brief narrative: Brooke Baldwin is a 44 yo female with PMH CKD (progressive decline, currently stage V), HIV (from [place], no prior records), HTN, trigeminal neuralgia. On 11/25, patient presented to the ED with worsening renal function. She has been followed closely outpatient with nephrology and found to have deteriorating renal function. She has undergone work-up in the past including renal biopsy.  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   She was admitted for fluids and close monitoring in case of need for starting on dialysis inpatient.  She underwent repeat HIV labs outpatient with nephrology, however records unable to be viewed in epic.  ID clinic has also not restarted on treatment due to awaiting records from [place].  Labs were again ordered during this hospitalization to further confirm and guide treatment.  She states that she has been living here for approximately 2 years and has been out of treatment meds for approximately 1 year.  Previously was on [place] and another.  Per nephro notes, Cd4 was 5 on recent office check.   HIV labs returned and were consistent with confirming diagnosis as well as significantly low CD4 count (less than 35).  She was evaluated by infectious disease and started on Biktarvy.  Atovaquone was started for PJP prophylaxis.  She was started on IV fluids and a bicarb drip on admission. Renal response/improvement was minimal. Due to her rapid progression of renal decline, nephrology recommended patient undergo VVS consultation with plans for initiation of HD in the next several days.  She underwent placement of left forearm Gore-Tex AV graft on 07/04/2020.  Subjective: Patient was seen and examined this morning. Lying on bed. Not in  distress.   Underwent dialysis yesterday.  Pain and swelling of the left forearm improving. No recurrence of diarrhea.  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 -Minimal response to IV fluid. -VVS consulted; patient underwent left AV graft placement on 07/04/2020.  Postsurgically, patient started having swelling, redness and blister formation around the surgical site.  -With improvement in symptoms, patient underwent cannulation for graft and dialysis on 12/5.  Noted a plan from nephrology to do second dialysis tomorrow 12/7.    Elevated blood pressure  -Intermittently elevated blood pressure with no history of hypertension.   -Continue to monitor.   -For now, use PRN labetalol or hydralazine  HIV (human immunodeficiency virus infection) ([place]) - there has been difficulty confirming and obtaining CD4 count and VL outpatient - last CD4 is 5 per nephrology notes from check in office outpatient; unable to view records and ID clinic has not received records from [place] consult appreciated. 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 GI pathogen panel noted enteropathogenic E. coli.   -3-day course of azithromycin completed on 12/1.  Diarrhea improved.  Neutropenia  -Nontoxic-appearing and low suspicion for infection at this time.  Possibly due to immunosuppression from HIV and/or GI illness -Continue monitoring otherwise Recent Labs  Lab 07/05/20 0426 07/06/20 0040  WBC 2.9* 3.9*  Secondary hyperparathyroidism -Continue sevelamer per nephrology -Continue renal diet  Hypoalbuminemia -Level of 1 on admission with goal of 2.5 per nephrology -currently on albumin 25 g BID until  goal reached -Albumin is 1.6 on blood work today 11/30.  Right trigeminal neuralgia -Not on meds  Mobility: Encourage ambulation Code Status:   Code Status: Full Code  Nutritional status: Body mass index is 39.6 kg/m.     Diet Order            Diet renal with fluid restriction Fluid restriction: 1200 mL Fluid; Room service appropriate? Yes; Fluid consistency: Thin  Diet effective now                 DVT prophylaxis: heparin injection 5,000 Units Start: 06/30/20 1400 SCDs Start: 06/30/20 1314   Antimicrobials:  HIV meds Fluid: No fluid Consultants: Nephrology, vascular surgery Family Communication:  None at bedside  Status is: Inpatient  Remains inpatient appropriate because: Initiated on dialysis.  Second session tomorrow.  Needs outpatient care arranged  Dispo: The patient is from: Home              Anticipated d/c is to: Home              Anticipated d/c date is: More than 3 days              Patient currently is not medically stable to d/c.   Infusions:    Scheduled Meds: . acetaminophen  650 mg Oral Q6H  . atovaquone  1,500 mg Oral Q breakfast  . bictegravir-emtricitabine-tenofovir AF  1 tablet Oral Daily  . Chlorhexidine Gluconate Cloth  6 each Topical Q0600  . heparin  5,000 Units Subcutaneous Q8H  . kidney failure book   Does not apply Once  . multivitamin  1 tablet Oral QHS  . sevelamer carbonate  800 mg Oral TID WC    Antimicrobials: Anti-infectives (From admission, onward)   Start     Dose/Rate Route Frequency Ordered Stop   07/05/20 0000  bictegravir-emtricitabine-tenofovir AF (BIKTARVY) 50-200-25 MG TABS tablet        1 tablet Oral Daily 07/05/20 1409     07/04/20 0000  vancomycin (VANCOCIN) IVPB 1000 mg/200 mL premix        1,000 mg 200 mL/hr over 60 Minutes Intravenous To Surgery 07/03/20 0854 07/04/20 0755   07/03/20 1530  azithromycin (ZITHROMAX) tablet 500 mg        500 mg Oral Daily 07/03/20 1435 07/06/20 0959   07/03/20 1015   bictegravir-emtricitabine-tenofovir AF (BIKTARVY) 50-200-25 MG per tablet 1 tablet        1 tablet Oral Daily 07/03/20 0949     07/02/20 1000  atovaquone (MEPRON) 750 MG/5ML suspension 1,500 mg        1,500 mg Oral Daily with breakfast 07/02/20 0901        PRN meds: calcium carbonate (dosed in mg elemental calcium), camphor-menthol **AND** hydrOXYzine, dextromethorphan, docusate sodium, feeding supplement (NEPRO CARB STEADY), hydrALAZINE, labetalol, loperamide, menthol-cetylpyridinium, morphine injection, ondansetron **OR** ondansetron (ZOFRAN) IV, oxyCODONE, sorbitol, zolpidem   Objective: Vitals:   07/11/20 0336 07/11/20 1100  BP: 118/89 127/77  Pulse: (!) 104 99  Resp: 20 18  Temp: 98.6 F (37 C) 98.4 F (36.9 C)  SpO2: 98% 98%    Intake/Output Summary (Last 24 hours) at 07/11/2020 1158 Last data filed at 07/11/2020 0936 Baldwin per 24 hour  Intake 700 ml  Output 500 ml  Net 200 ml   [employer]  07/10/20 1130 07/10/20 1410 07/11/20 0336  Weight: 101.6 kg 101.5 kg 98.2 kg   Weight change: 3.1 kg Body mass index is 39.6 kg/m.   Physical Exam: General exam: Not in physical distress Skin: No rashes, lesions or ulcers. HEENT: Atraumatic, normocephalic, no obvious bleeding Lungs: Clear to auscultation bilaterally CVS: Regular rate and rhythm, no murmur GI/Abd soft, nontender, nondistended, bowel sound present CNS: Alert, awake monitor x3 Psychiatry: Mood appropriate Extremities: Left forearm AV graft site area improving in redness and swelling.  Data Review: I have personally reviewed the laboratory data and studies available.  Recent Labs  Lab 07/05/20 0426 07/06/20 0040  WBC 2.9* 3.9*  NEUTROABS 1.5* 2.2  HGB 10.5* 11.4*  HCT 32.7* 36.2  MCV 88.9 90.5  PLT 104* 122*   Recent Labs  Lab 07/05/20 0426 07/05/20 0855 07/06/20 0040 07/06/20 0040 07/07/20 0439 07/08/20 0321 07/09/20 0317 07/10/20 0148 07/11/20 0507  NA 142   < > 141   < > 138 138 142  141 139  K 4.2   < > 4.4   < > 4.6 4.4 4.7 4.1 4.0  CL 107   < > 105   < > 104 102 103 104 101  CO2 21*   < > 23   < > 21* 21* 21* 23 26  GLUCOSE 86   < > 89   < > 78 81 70 94 87  BUN 60*   < > 61*   < > 66* 68* 75* 77* 53*  CREATININE 10.51*   < > 10.99*   < > 11.80* 12.32* 12.37* 13.10* 10.16*  CALCIUM 7.5*   < > 7.7*   < > 7.8* 7.6* 7.9* 7.9* 7.9*  MG 1.7  --  1.7  --   --   --   --   --   --   PHOS 6.2*   < > 7.3*   < > 8.8* 8.5* 8.8* 9.0* 6.5*   < > = values in this interval not displayed.    F/u labs ordered  Signed, Terrilee Croak, MD [facility] 07/11/2020

## 2020-07-12 ENCOUNTER — Other Ambulatory Visit (HOSPITAL_COMMUNITY): Payer: Self-pay | Admitting: Internal Medicine

## 2020-07-12 DIAGNOSIS — N179 Acute kidney failure, unspecified: Secondary | ICD-10-CM | POA: Diagnosis not present

## 2020-07-12 DIAGNOSIS — N185 Chronic kidney disease, stage 5: Secondary | ICD-10-CM | POA: Diagnosis not present

## 2020-07-12 LAB — CBC
HCT: 27 % — ABNORMAL LOW (ref 36.0–46.0)
Hemoglobin: 8.7 g/dL — ABNORMAL LOW (ref 12.0–15.0)
MCH: 29 pg (ref 26.0–34.0)
MCHC: 32.2 g/dL (ref 30.0–36.0)
MCV: 90 fL (ref 80.0–100.0)
Platelets: 170 10*3/uL (ref 150–400)
RBC: 3 MIL/uL — ABNORMAL LOW (ref 3.87–5.11)
RDW: 14 % (ref 11.5–15.5)
WBC: 5.1 10*3/uL (ref 4.0–10.5)
nRBC: 0 % (ref 0.0–0.2)

## 2020-07-12 LAB — RENAL FUNCTION PANEL
Albumin: 1.1 g/dL — ABNORMAL LOW (ref 3.5–5.0)
Anion gap: 13 (ref 5–15)
BUN: 64 mg/dL — ABNORMAL HIGH (ref 6–20)
CO2: 25 mmol/L (ref 22–32)
Calcium: 7.9 mg/dL — ABNORMAL LOW (ref 8.9–10.3)
Chloride: 102 mmol/L (ref 98–111)
Creatinine, Ser: 11.38 mg/dL — ABNORMAL HIGH (ref 0.44–1.00)
GFR, Estimated: 4 mL/min — ABNORMAL LOW (ref >60)
Glucose, Bld: 77 mg/dL (ref 70–99)
Phosphorus: 6.4 mg/dL — ABNORMAL HIGH (ref 2.5–4.6)
Potassium: 4.2 mmol/L (ref 3.5–5.1)
Sodium: 140 mmol/L (ref 135–145)

## 2020-07-12 MED ORDER — SEVELAMER CARBONATE 800 MG PO TABS
1600.0000 mg | ORAL_TABLET | Freq: Three times a day (TID) | ORAL | Status: DC
  Filled 2020-07-12: qty 2

## 2020-07-12 MED ORDER — RENA-VITE PO TABS: 1.0000 | ORAL_TABLET | Freq: Every day | ORAL | 0 refills | Status: DC

## 2020-07-12 MED ORDER — SEVELAMER CARBONATE 800 MG PO TABS: 1600.0000 mg | ORAL_TABLET | Freq: Three times a day (TID) | ORAL | 0 refills | Status: DC

## 2020-07-12 MED ORDER — BIKTARVY 50-200-25 MG PO TABS
1.0000 | ORAL_TABLET | Freq: Every day | ORAL | 5 refills | Status: DC
Start: 2020-07-12 — End: 2021-01-05

## 2020-07-12 MED ORDER — CAMPHOR-MENTHOL 0.5-0.5 % EX LOTN: 1.0000 "application " | TOPICAL_LOTION | Freq: Three times a day (TID) | CUTANEOUS | 0 refills | Status: DC | PRN

## 2020-07-12 MED ORDER — CALCIUM CARBONATE ANTACID 1250 MG/5ML PO SUSP: 500.0000 mg | Freq: Four times a day (QID) | ORAL | 0 refills | Status: DC | PRN

## 2020-07-12 MED ORDER — OXYCODONE HCL 5 MG PO TABS: 5.0000 mg | ORAL_TABLET | Freq: Three times a day (TID) | ORAL | 0 refills | Status: DC | PRN

## 2020-07-12 MED ORDER — ATOVAQUONE 750 MG/5ML PO SUSP: 1500.0000 mg | Freq: Every day | ORAL | 0 refills | Status: DC

## 2020-07-12 MED ORDER — HYDROXYZINE HCL 25 MG PO TABS: 25.0000 mg | ORAL_TABLET | Freq: Three times a day (TID) | ORAL | 0 refills | Status: DC | PRN

## 2020-07-12 MED FILL — RENA-VITE TABS: 30 days supply | Qty: 30 | Fill #0

## 2020-07-12 MED FILL — hydrOXYzine HCL 25 MG TABS: 25 | 7 days supply | Qty: 21 | Fill #0

## 2020-07-12 MED FILL — SEVELAMER CARBONATE 800 MG: 800 | 30 days supply | Qty: 180 | Fill #0

## 2020-07-12 MED FILL — ATOVAQUONE 750 MG/5ML SUSP: 750 | 14 days supply | Qty: 140 | Fill #0

## 2020-07-12 MED FILL — oxyCODONE HCL 5 MG TABS: 5 | 5 days supply | Qty: 15 | Fill #0

## 2020-07-12 NOTE — Plan of Care (Signed)
  Problem: Education: Goal: Knowledge of disease and its progression will improve Outcome: Adequate for Discharge Goal: Individualized Educational Video(s) Outcome: Adequate for Discharge   Problem: Fluid Volume: Goal: Compliance with measures to maintain balanced fluid volume will improve Outcome: Adequate for Discharge   Problem: Health Behavior/Discharge Planning: Goal: Ability to manage health-related needs will improve Outcome: Adequate for Discharge   Problem: Nutritional: Goal: Ability to make healthy dietary choices will improve Outcome: Adequate for Discharge   Problem: Clinical Measurements: Goal: Complications related to the disease process, condition or treatment will be avoided or minimized Outcome: Adequate for Discharge   Problem: Education: Goal: Knowledge of General Education information will improve Description: Including pain rating scale, medication(s)/side effects and non-pharmacologic comfort measures Outcome: Adequate for Discharge   Problem: Health Behavior/Discharge Planning: Goal: Ability to manage health-related needs will improve Outcome: Adequate for Discharge   Problem: Clinical Measurements: Goal: Ability to maintain clinical measurements within normal limits will improve Outcome: Adequate for Discharge Goal: Will remain free from infection Outcome: Adequate for Discharge Goal: Diagnostic test results will improve Outcome: Adequate for Discharge Goal: Respiratory complications will improve Outcome: Adequate for Discharge Goal: Cardiovascular complication will be avoided Outcome: Adequate for Discharge   Problem: Elimination: Goal: Will not experience complications related to bowel motility Outcome: Adequate for Discharge Goal: Will not experience complications related to urinary retention Outcome: Adequate for Discharge

## 2020-07-12 NOTE — Progress Notes (Addendum)
[facility] Progress Note    Assessment/ Plan:   1. 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 S/p acuseal LAVG and can be used in the next 24-48 hours. 1. No emergent need for dialysis so will try to waitas long as possiblebefore cannulating the Acuseal AVGwhich unfortunately is causing some discomfort.  2. VVS evaluating for possible steal syndrome but appears to be slowly improving. Certainly not worse. 3. Continues to have some induration and blisters on forearm, VVS following. 4. First cannulation 12/5, 2nd on 12/7  and use ethyl chloride spray as she couldn't tolerate EMLA cream for unclear reasons. Patient appeared to have done well with this. She is currently on dialysis and tolerating procedure well. Still endorsing some hand pain.  5. Plan for dialysis today; Can plan next HD Thursday as outpatient. She has a spot at [facility] TTS 2nd shift at 12pm. 2. /AIDS- has not been well controlled and not on ART for over a year. Started Biktarvy and atovaquone. ID  now managing her medications. 3. Metabolic acidosis - due to CKD, resolved with dialysis -> stopped oral bicarb 4. HTN- stable 5. Enteropathogenic E. Coli- on 3 day course of azithromycin per primary svc 6. Pancytopenia- per ID. 7. SHPTH- on binders and will follow -> incr renvela to 2 TIDM. 8. Vascular access- s/p Left forearm Acuseal AVG placed 07/04/20. Can be used anytime, 17 gauge needles, BFR <250 for first 2 post-operative weeks, and low dose heparin should be used. 10-15 minutes of pressure after decannulating.  Subjective:   Patient reports that the dialysis session is doing fine. She still endorses some hand numbness and pain, states that it comes and goes, has not been getting worse, is able to move it. Denies fevers, chills, nausea, vomiting, chest pain, SOB, or fatigue.   Objective:   BP (!) 149/100 (BP Location: Right Arm)   Pulse (!) 103   Temp 99.1 F  (37.3 C) (Oral)   Resp 17   Ht 5\' 2"  (1.575 m)   Wt 98.2 kg   LMP 04/02/2015 (Approximate)   SpO2 97%   BMI 39.62 kg/m   Intake/Output Summary (Last 24 hours) at 07/12/2020 9924 Last data filed at 07/12/2020 0544 Gross per 24 hour  Intake 440 ml  Output 1400 ml  Net -960 ml   Weight change: -3.351 kg  Physical Exam: General: Middle aged female, NAD, laying in bed Cardiac: RRR, no m/r/g Pulmonary: CTABL, no wheezing, rhonchi, rales Abdomen: Soft, non-tender, non-distended Extremities: 1+ BL pitting edema, L forearm AVG in use, mild erythema around site. Left hand is warm to touch, pulses intact, able to move fingers  Imaging: No results found.  Labs: BMET Recent Labs  Lab 07/06/20 0040 07/07/20 0439 07/08/20 0321 07/09/20 0317 07/10/20 0148 07/11/20 0507 07/12/20 0354  NA 141 138 138 142 141 139 140  K 4.4 4.6 4.4 4.7 4.1 4.0 4.2  CL 105 104 102 103 104 101 102  CO2 23 21* 21* 21* 23 26 25   GLUCOSE 89 78 81 70 94 87 77  BUN 61* 66* 68* 75* 77* 53* 64*  CREATININE 10.99* 11.80* 12.32* 12.37* 13.10* 10.16* 11.38*  CALCIUM 7.7* 7.8* 7.6* 7.9* 7.9* 7.9* 7.9*  PHOS 7.3* 8.8* 8.5* 8.8* 9.0* 6.5* 6.4*   CBC Recent Labs  Lab 07/06/20 0040 07/12/20 0800  WBC 3.9* 5.1  NEUTROABS 2.2  --   HGB 11.4* 8.7*  HCT 36.2 27.0*  MCV 90.5 90.0  PLT 122* 170    Medications:    . acetaminophen  650 mg Oral Q6H  . atovaquone  1,500 mg Oral Q breakfast  . bictegravir-emtricitabine-tenofovir AF  1 tablet Oral Daily  . Chlorhexidine Gluconate Cloth  6 each Topical Q0600  . heparin  5,000 Units Subcutaneous Q8H  . kidney failure book   Does not apply Once  . multivitamin  1 tablet Oral QHS  . sevelamer carbonate  800 mg Oral TID WC    Asencion Noble, M.D. PGY3 07/12/2020 8:32 AM  I have seen and examined this patient and agree with the plan of care. Still has left hand pain, numbness but does not think it is worse. Seen on HD this AM  Turned off UF bec pressures  dropped. WIll rechallenge if it improves later in the treatment. Plan next HD tomorrow and still waiting for an outpt chair.   Dwana Melena, MD 07/12/2020, 9:34 AM

## 2020-07-12 NOTE — Discharge Summary (Signed)
Physician Discharge Summary  Brooke Baldwin KYH:[id number] DOB: January 05, 1976 DOA: 06/30/2020  PCP: Patient, No Pcp Per  Admit date: 06/30/2020 Discharge date: 07/12/2020  Admitted From: Home Discharge disposition: Home   Code Status: Full Code  Diet Recommendation: Renal diet  Discharge Diagnosis:   Principal Problem:   Acute renal failure superimposed on stage 5 chronic kidney disease, not on chronic dialysis [facility]) Active Problems:   Right trigeminal neuralgia   HIV (human immunodeficiency virus infection) ([place])   Elevated blood pressure reading without diagnosis of hypertension   Hypoalbuminemia   Secondary hyperparathyroidism ([place])   Neutropenia (HCC)   Thrombocytopenia ([place])   Intestinal infection due to enteropathogenic E. coli  History of Present Illness / Brief narrative:  [name] is a 44 yo female with PMH CKD (progressive decline, currently stage V), HIV (from [place], no prior records), HTN, trigeminal neuralgia. On 11/25, patient presented to the ED with worsening renal function. She has been followed closely outpatient with nephrology and found to have deteriorating renal function. She has undergone work-up in the past including renal biopsy. 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   She was admitted for fluids and close monitoring in case of need for starting on dialysis inpatient.  She underwent repeat HIV labs outpatient with nephrology, however records unable to be viewed in epic. ID clinic has also not restarted on treatment due to awaiting records from [place]. Labs were again ordered during this hospitalization to further confirm and guide treatment. She states that she has been living here for approximately 2 years and has been out of treatment meds for approximately 1 year. Previously was on Tivicay and another.  Per nephro notes, Cd4 was 5 on recent office check.   HIV labs returned and were consistent with  confirming diagnosis as well as significantly low CD4 count (less than 35). She was evaluated by infectious disease and started on Biktarvy. Atovaquone was started for PJP prophylaxis.  She was started on IV fluids and a bicarb drip on admission. Renal response/improvement was minimal. Due to rapid renal decline, nephrology recommended patient undergo VVS consultation with plans for initiation of HD in the next several days.  She underwent placement of left forearm Gore-Tex AV graft on 07/04/2020.  Subjective:  Seen and examined this morning.  Patient was just back from dialysis.  Not in distress.  No new symptoms.  Discussed with her nephrologist this afternoon.  Patient has an outpatient dialysis chair set up for her TTS schedule.  Hospital Course:  AKI on CKD 5  New ESRD -on hemodialysis -patient has rapidly declining renal function; leading theory is collapsing FSGS, possibly due to HIVAN. -Minimal response to IV fluid. -VVS consulted;patient underwentleftAV graft placement on 07/04/2020.  Postsurgically, patient started having swelling, redness and blister formation around the surgical site.  -With improvement in symptoms, patient underwent cannulation for graft and dialysis on 12/5. She underwent second session of dialysis today.  Outpatient dialysis chair has been set up for TTS schedule.   Elevated blood pressure  -Intermittently elevated blood pressure with no history of hypertension.   -Blood pressure currently stable.  HIV (human immunodeficiency virus infection) ([place]) - there has been difficulty confirming and obtaining CD4 count and VLoutpatient - last CD4 is 5 per nephrology notes from check in office outpatient; unable to view records and ID clinic has not received records from [place]. - ID consult appreciated. - patient has been started onBiktarvy while inpatient - started atovaquone  for PJP ppx(started on 11/27) - further treatment per ID   Intestinal infection  due to enteropathogenic E. coli -New diarrhea in this admission. GI pathogen panel noted enteropathogenic E. coli.   -3-day course of azithromycin completed on 12/1.  Diarrhea improved.  Neutropenia  -Nontoxic-appearing and low suspicion for infection at this time. Possibly due to immunosuppression from HIV and/or GI illness -Continue monitoring otherwise Recent Labs  Lab 07/06/20 0040 07/12/20 0800  WBC 3.9* 5.1   Secondary hyperparathyroidism -Continue sevelamer per nephrology -Continue renal diet  Severe hypoalbuminemia -Albumin level continues to remain low, 1.1 on blood work this morning. Recent Labs  Lab 07/08/20 0321 07/09/20 0317 07/10/20 0148 07/11/20 0507 07/12/20 0354  ALBUMIN 1.3* 1.2* 1.3* 1.3* 1.1*   Right trigeminal neuralgia -Not on meds  Stable for discharge to home today.  Outpatient dialysis arranged for TTS schedule.  Wound care: Incision (Closed) 04/13/20 Flank Left (Active)  Date First Assessed/Time First Assessed: 04/13/20 1036   Location: Flank  Location Orientation: Left  Present on Admission: No    Assessments 04/13/2020 10:36 AM 04/13/2020  4:00 PM  Dressing Type Adhesive bandage Adhesive bandage  Dressing Dry;Intact;Clean Clean;Dry;Intact  Dressing Change Frequency PRN --  Site / Wound Assessment Clean;Dry --  Closure None --  Drainage Amount None None  Drainage Description No odor --  Treatment Cleansed --     No Linked orders to display     Incision (Closed) 07/04/20 Arm Left (Active)  Date First Assessed/Time First Assessed: 07/04/20 0919   Location: Arm  Location Orientation: Left    Assessments 07/04/2020 10:09 AM 07/10/2020  8:11 AM  Dressing Type Liquid skin adhesive;Adhesive strips Adhesive strips;Gauze (Comment)  Dressing Clean;Dry;Intact Intact;Dry  Site / Wound Assessment Dressing in place / Unable to assess Dressing in place / Unable to assess  Margins Attached edges (approximated) --  Drainage Amount None --  Drainage  Description No odor --     No Linked orders to display    Discharge Exam:   Vitals:   07/12/20 1024 07/12/20 1053 07/12/20 1135 07/12/20 1158  BP: 110/86 118/83 (!) 127/94 (!) 126/92  Pulse: (!) 115 (!) 116 (!) 110 (!) 111  Resp: (!) 22 18 16 18   Temp:  98.5 F (36.9 C) 98.7 F (37.1 C) 99.4 F (37.4 C)  TempSrc:  Oral Oral Oral  SpO2:   100% 99%  Weight:  99 kg    Height:        Body mass index is 39.92 kg/m.  General exam: Not in distress Skin: No rashes, lesions or ulcers. HEENT: Atraumatic, normocephalic, no obvious bleeding Lungs: Clear to auscultation bilaterally CVS: Regular rate and rhythm, no murmur GI/Abd soft, nontender, nondistended, bowel sound present CNS: Alert, awake, oriented x3 Psychiatry: Mood appropriate Extremities: No pedal edema, no calf tenderness.  Left forearm swelling and redness improving.  Follow ups:   Discharge Instructions    Diet - low sodium heart healthy   Complete by: As directed    Increase activity slowly   Complete by: As directed    Leave dressing on - Keep it clean, dry, and intact until clinic visit   Complete by: As directed       Follow-up Information    Cherre Robins, MD Follow up on 08/02/2020.   Specialties: Vascular Surgery, Interventional Cardiology Why: Appointment time = 1pm Contact information: [address] [place] [address] [phone]        Maida Sale, MD .  Specialty: Urology Contact information: [address] [place] [address] [phone]               Recommendations for Outpatient Follow-Up:   1. Follow-up with nephrology as an outpatient 2. Follow-up with PCP as an outpatient  Discharge Instructions:  Follow with Primary MD Patient, No Pcp Per in 7 days   Get CBC/BMP checked in next visit within 1 week by PCP or SNF MD ( we routinely change or add medications that can affect your baseline labs and fluid  status, therefore we recommend that you get the mentioned basic workup next visit with your PCP, your PCP may decide not to get them or add new tests based on their clinical decision)  On your next visit with your PCP, please Get Medicines reviewed and adjusted.  Please request your PCP  to go over all Hospital Tests and Procedure/Radiological results at the follow up, please get all Hospital records sent to your Prim MD by signing hospital release before you go home.  Activity: As tolerated with Full fall precautions use walker/cane & assistance as needed  For Heart failure patients - Check your Weight same time everyday, if you gain over 2 pounds, or you develop in leg swelling, experience more shortness of breath or chest pain, call your Primary MD immediately. Follow Cardiac Low Salt Diet and 1.5 lit/day fluid restriction.  If you have smoked or chewed Tobacco in the last 2 yrs please stop smoking, stop any regular Alcohol  and or any Recreational drug use.  If you experience worsening of your admission symptoms, develop shortness of breath, life threatening emergency, suicidal or homicidal thoughts you must seek medical attention immediately by calling 911 or calling your MD immediately  if symptoms less severe.  You Must read complete instructions/literature along with all the possible adverse reactions/side effects for all the Medicines you take and that have been prescribed to you. Take any new Medicines after you have completely understood and accpet all the possible adverse reactions/side effects.   Do not drive, operate heavy machinery, perform activities at heights, swimming or participation in water activities or provide baby sitting services if your were admitted for syncope or siezures until you have seen by Primary MD or a Neurologist and advised to do so again.  Do not drive when taking Pain medications.  Do not take more than prescribed Pain, Sleep and Anxiety Medications  Wear  Seat belts while driving.   Please note You were cared for by a hospitalist during your hospital stay. If you have any questions about your discharge medications or the care you received while you were in the hospital after you are discharged, you can call the unit and asked to speak with the hospitalist on call if the hospitalist that took care of you is not available. Once you are discharged, your primary care physician will handle any further medical issues. Please note that NO REFILLS for any discharge medications will be authorized once you are discharged, as it is imperative that you return to your primary care physician (or establish a relationship with a primary care physician if you do not have one) for your aftercare needs so that they can reassess your need for medications and monitor your lab values.    Allergies as of 07/12/2020      Reactions   Baclofen Other (See Comments)   Caused intolerable dizziness    Gabapentin Other (See Comments)  Caused intolerable dizziness   Penicillins Other (See Comments)   Unknown childhood reaction      Medication List    TAKE these medications   atovaquone 750 MG/5ML suspension Commonly known as: MEPRON Take 10 mLs (1,500 mg total) by mouth daily with breakfast.   Biktarvy 50-200-25 MG Tabs tablet Generic drug: bictegravir-emtricitabine-tenofovir AF Take 1 tablet by mouth daily.   calcium carbonate (dosed in mg elemental calcium) 1250 MG/5ML Susp Take 5 mLs (500 mg of elemental calcium total) by mouth every 6 (six) hours as needed for indigestion.   camphor-menthol lotion Commonly known as: SARNA Apply 1 application topically every 8 (eight) hours as needed for itching.   hydrOXYzine 25 MG tablet Commonly known as: ATARAX/VISTARIL Take 1 tablet (25 mg total) by mouth every 8 (eight) hours as needed for up to 7 days for itching.   multivitamin Tabs tablet Take 1 tablet by mouth at bedtime.   oxyCODONE 5 MG immediate release  tablet Commonly known as: Oxy IR/ROXICODONE Take 1 tablet (5 mg total) by mouth every 8 (eight) hours as needed for up to 5 days for severe pain.   sevelamer carbonate 800 MG tablet Commonly known as: RENVELA Take 2 tablets (1,600 mg total) by mouth 3 (three) times daily with meals.            Discharge Care Instructions  (From admission, onward)         Start     Ordered   07/12/20 0000  Leave dressing on - Keep it clean, dry, and intact until clinic visit        07/12/20 1558          Time coordinating discharge: 35 minutes  The results of significant diagnostics from this hospitalization (including imaging, microbiology, ancillary and laboratory) are listed below for reference.    Procedures and Diagnostic Studies:   US RENAL  Result Date: 06/30/2020 CLINICAL DATA:  44 year old female with acute kidney injury EXAM: RENAL / URINARY TRACT ULTRASOUND COMPLETE COMPARISON:  03/14/2020 and CT renal biopsy from 04/13/2020 FINDINGS: Right Kidney: Renal measurements: 7.8 x 5.1 x 4.8 = volume: 100 mL. Poorly visualized with suggestion of diffuse atrophy and increased echogenicity. No mass or hydronephrosis visualized. Left Kidney: Renal measurements: 8.6 x 4.2 x 4.7 = volume: 88 mL. Poorly visualized with suggestion of diffuse atrophy and increased echogenicity. No mass or hydronephrosis visualized. Bladder: Appears normal for degree of bladder distention. Other: None. IMPRESSION: Atrophic kidneys bilaterally with increased echogenicity. No hydronephrosis. Poorly visualized on this study due to body habitus and overlying bowel gas. Electronically Signed   By: Ruthann Cancer MD   On: 06/30/2020 14:45   DG Chest Port 1 View  Result Date: 06/30/2020 CLINICAL DATA:  Evaluate for volume overload. EXAM: PORTABLE CHEST 1 VIEW COMPARISON:  06/28/2020 FINDINGS: The heart size and mediastinal contours are within normal limits. Both lungs are clear. The visualized skeletal structures are  unremarkable. IMPRESSION: No active disease. Electronically Signed   By: Kerby Moors M.D.   On: 06/30/2020 13:21     Labs:   Basic Metabolic Panel: Recent Labs  Lab 07/06/20 0040 07/07/20 0439 07/08/20 0321 07/08/20 0321 07/09/20 0317 07/09/20 0317 07/10/20 0148 07/10/20 0148 07/11/20 0507 07/12/20 0354  NA 141   < > 138  --  142  --  141  --  139 140  K 4.4   < > 4.4   < > 4.7   < > 4.1   < > 4.0  4.2  CL 105   < > 102  --  103  --  104  --  101 102  CO2 23   < > 21*  --  21*  --  23  --  26 25  GLUCOSE 89   < > 81  --  70  --  94  --  87 77  BUN 61*   < > 68*  --  75*  --  77*  --  53* 64*  CREATININE 10.99*   < > 12.32*  --  12.37*  --  13.10*  --  10.16* 11.38*  CALCIUM 7.7*   < > 7.6*  --  7.9*  --  7.9*  --  7.9* 7.9*  MG 1.7  --   --   --   --   --   --   --   --   --   PHOS 7.3*   < > 8.5*  --  8.8*  --  9.0*  --  6.5* 6.4*   < > = values in this interval not displayed.   GFR Estimated Creatinine Clearance: 6.9 mL/min (A) (by C-G formula based on SCr of 11.38 mg/dL (H)). Liver Function Tests: Recent Labs  Lab 07/08/20 0321 07/09/20 0317 07/10/20 0148 07/11/20 0507 07/12/20 0354  ALBUMIN 1.3* 1.2* 1.3* 1.3* 1.1*   No results for input(s): LIPASE, AMYLASE in the last 168 hours. No results for input(s): AMMONIA in the last 168 hours. Coagulation profile No results for input(s): INR, PROTIME in the last 168 hours.  CBC: Recent Labs  Lab 07/06/20 0040 07/12/20 0800  WBC 3.9* 5.1  NEUTROABS 2.2  --   HGB 11.4* 8.7*  HCT 36.2 27.0*  MCV 90.5 90.0  PLT 122* 170   Cardiac Enzymes: No results for input(s): CKTOTAL, CKMB, CKMBINDEX, TROPONINI in the last 168 hours. BNP: Invalid input(s): POCBNP CBG: No results for input(s): GLUCAP in the last 168 hours. D-Dimer No results for input(s): DDIMER in the last 72 hours. Hgb A1c No results for input(s): HGBA1C in the last 72 hours. Lipid Profile No results for input(s): CHOL, HDL, LDLCALC, TRIG, CHOLHDL,  LDLDIRECT in the last 72 hours. Thyroid function studies No results for input(s): TSH, T4TOTAL, T3FREE, THYROIDAB in the last 72 hours.  Invalid input(s): FREET3 Anemia work up No results for input(s): VITAMINB12, FOLATE, FERRITIN, TIBC, IRON, RETICCTPCT in the last 72 hours. Microbiology Recent Results (from the past 240 hour(s))  Surgical pcr screen     Status: None   Collection Time: 07/03/20 11:25 AM   Specimen: Nasal Mucosa; Nasal Swab  Result Value Ref Range Status   MRSA, PCR NEGATIVE NEGATIVE Final   Staphylococcus aureus NEGATIVE NEGATIVE Final    Comment: (NOTE) The Xpert SA Assay (FDA approved for NASAL specimens in patients 79 years of age and older), is one component of a comprehensive surveillance program. It is not intended to diagnose infection nor to guide or monitor treatment. Performed at [facility], [place] [address], [place], [place] [address]      Signed: Terrilee Croak  [facility] 07/12/2020, 3:59 PM

## 2020-07-12 NOTE — Progress Notes (Signed)
Patient has been accepted at [facility] on a TTS schedule with a seat time of 12:00pm. She needs to arrive to her appointments at 11:40am.  On her first day at the clinic, patient needs to arrive at 11:00am. We will plan for her first treatment in the clinic to be Thursday 07/14/20 if patient is discharged before that time. Patient states she has transportation through a friend, but would like Navigator to assist her in applying for [facility] [place]. She will not need to utilize this service until next week. She has signed application and Navigator will submit.  Navigator has asked Renal PA to send orders to outpatient clinic.   Alphonzo Cruise, [place] [facility] Navigator [phone]

## 2020-07-12 NOTE — Progress Notes (Signed)
D/C instructions given and reviewed. No questions asked but encouraged to call with any concerns.

## 2020-07-14 ENCOUNTER — Ambulatory Visit (INDEPENDENT_AMBULATORY_CARE_PROVIDER_SITE_OTHER): Payer: Self-pay | Admitting: Physician Assistant

## 2020-07-14 ENCOUNTER — Other Ambulatory Visit: Payer: Self-pay

## 2020-07-14 ENCOUNTER — Telehealth: Payer: Self-pay

## 2020-07-14 VITALS — BP 125/83 | HR 120 | Temp 98.0°F | Resp 20 | Ht 62.0 in | Wt 209.8 lb

## 2020-07-14 DIAGNOSIS — N186 End stage renal disease: Secondary | ICD-10-CM

## 2020-07-14 DIAGNOSIS — Z992 Dependence on renal dialysis: Secondary | ICD-10-CM

## 2020-07-14 NOTE — Telephone Encounter (Signed)
Brooke Baldwin from HD called, patient unable to receive dialysis due to arm being so swollen and painful. Her fingers are cold and numb per nurse. Put patient on the schedule for today.

## 2020-07-14 NOTE — Progress Notes (Signed)
    Postoperative Access Visit   History of Present Illness   Brooke Baldwin is a 44 y.o. year old female who presents for postoperative follow-up for early access left forearm loop graft by Dr. Stanford Breed on 07/04/2020.  She was urgently worked into the office schedule today for evaluation of L arm AVG.  She states the steal symptoms that she had postoperatively during hospitalization including coolness and mild sensory changes have not worsened since surgery.  The symptoms are worse when elevating her arm or when hooked up to dialysis.  She states she presented to the [facility] location for dialysis today and was told that it is too early to stick her graft.   She does have some edema of her left forearm.  She denies any tissue changes to her fingers.    Physical Examination   Vitals:   07/14/20 1403  BP: 125/83  Pulse: (!) 120  Resp: 20  Temp: 98 F (36.7 C)  TempSrc: Temporal  SpO2: 100%  Weight: 209 lb 12.8 oz (95.2 kg)  Height: 5\' 2"  (1.575 m)   Body mass index is 38.37 kg/m.  left arm  pitting edema of left forearm with edema of left hand; easily palpable thrill through forearm loop graft; radial, ulnar, palmar arch signal by Doppler augmented with compression of graft    Medical Decision Making   Brooke Baldwin is a 44 y.o. year old female who presents s/p early access left forearm loop graft   Patient does have mild steal however the symptoms are unchanged from hospitalization per patient  I encouraged the patient to use her arm and exercise her hand with a stress ball to promote venous return from left arm; she was also wrapped with a light Ace starting at the hand and encouraged to elevate her arm if tolerable periodically during the day  Based on Doppler exam she has left radial ulnar and palmar arch signals intact with cap refill slow but satisfactory  Patient will follow up with her surgeon Dr. Stanford Breed next week for further evaluation; patient is aware  the alternative is removing the AV graft and starting over.  At this time she believes her symptoms are tolerable and is agreeable to return next week      Dagoberto Ligas PA-C [facility] Office: [phone]  Clinic MD: Oneida Alar

## 2020-07-19 ENCOUNTER — Encounter: Payer: Self-pay | Admitting: Vascular Surgery

## 2020-07-19 ENCOUNTER — Ambulatory Visit (INDEPENDENT_AMBULATORY_CARE_PROVIDER_SITE_OTHER): Payer: Self-pay | Admitting: Vascular Surgery

## 2020-07-19 ENCOUNTER — Other Ambulatory Visit: Payer: Self-pay

## 2020-07-19 VITALS — BP 129/82 | HR 105 | Temp 97.3°F | Resp 20 | Ht 62.0 in | Wt 204.0 lb

## 2020-07-19 DIAGNOSIS — N186 End stage renal disease: Secondary | ICD-10-CM

## 2020-07-19 DIAGNOSIS — Z992 Dependence on renal dialysis: Secondary | ICD-10-CM

## 2020-07-19 NOTE — Progress Notes (Signed)
   ASSESSMENT & PLAN:  Brooke Baldwin is a 44 y.o. 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 She has continued to improve from a steal standpoint. Tolerating HD well. Looks great. Follow up with me or PA in 3 months with repeat duplex of AVG.  SUBJECTIVE:  Left hand improving. Still having some clumsiness. Tolerating HD.  OBJECTIVE:  BP 129/82 (BP Location: Right Arm, Patient Position: Sitting, Cuff Size: Large)   Pulse (!) 105   Temp (!) 97.3 F (36.3 C)   Resp 20   Ht 5\' 2"  (1.575 m)   Wt 204 lb (92.5 kg)   LMP 04/02/2015 (Approximate)   SpO2 100%   BMI 37.31 kg/m   Left arm AVG with strong thrill Steri strips in place.  CBC Latest Ref Rng & Units 07/12/2020 07/06/2020 07/05/2020  WBC 4.0 - 10.5 K/uL 5.1 3.9(L) 2.9(L)  Hemoglobin 12.0 - 15.0 g/dL 8.7(L) 11.4(L) 10.5(L)  Hematocrit 36.0 - 46.0 % 27.0(L) 36.2 32.7(L)  Platelets 150 - 400 K/uL 170 122(L) 104(L)     CMP Latest Ref Rng & Units 07/12/2020 07/11/2020 07/10/2020  Glucose 70 - 99 mg/dL 77 87 94  BUN 6 - 20 mg/dL 64(H) 53(H) 77(H)  Creatinine 0.44 - 1.00 mg/dL 11.38(H) 10.16(H) 13.10(H)  Sodium 135 - 145 mmol/L 140 139 141  Potassium 3.5 - 5.1 mmol/L 4.2 4.0 4.1  Chloride 98 - 111 mmol/L 102 101 104  CO2 22 - 32 mmol/L 25 26 23   Calcium 8.9 - 10.3 mg/dL 7.9(L) 7.9(L) 7.9(L)  Total Protein 6.5 - 8.1 g/dL - - -  Total Bilirubin 0.3 - 1.2 mg/dL - - -  Alkaline Phos 38 - 126 U/L - - -  AST 15 - 41 U/L - - -  ALT 0 - 44 U/L - - -    Estimated Creatinine Clearance: 6.6 mL/min (A) (by C-G formula based on SCr of 11.38 mg/dL (H)).  [name]. Stanford Breed, MD [facility] Phone Number: [phone] 07/19/2020 12:21 PM

## 2020-07-22 ENCOUNTER — Other Ambulatory Visit: Payer: Self-pay

## 2020-07-22 DIAGNOSIS — Z992 Dependence on renal dialysis: Secondary | ICD-10-CM

## 2020-07-23 ENCOUNTER — Other Ambulatory Visit: Payer: Self-pay

## 2020-07-23 ENCOUNTER — Emergency Department (HOSPITAL_COMMUNITY)
Admission: EM | Admit: 2020-07-23 | Discharge: 2020-07-24 | Disposition: A | Discharge status: Home / Self Care | Payer: POS | Attending: Emergency Medicine | Admitting: Emergency Medicine

## 2020-07-23 ENCOUNTER — Encounter (HOSPITAL_COMMUNITY): Payer: Self-pay | Admitting: Emergency Medicine

## 2020-07-23 DIAGNOSIS — T82838A Hemorrhage of vascular prosthetic devices, implants and grafts, initial encounter: Secondary | ICD-10-CM | POA: Insufficient documentation

## 2020-07-23 DIAGNOSIS — Z992 Dependence on renal dialysis: Secondary | ICD-10-CM | POA: Diagnosis not present

## 2020-07-23 DIAGNOSIS — N186 End stage renal disease: Secondary | ICD-10-CM | POA: Diagnosis not present

## 2020-07-23 DIAGNOSIS — I120 Hypertensive chronic kidney disease with stage 5 chronic kidney disease or end stage renal disease: Secondary | ICD-10-CM | POA: Diagnosis not present

## 2020-07-23 DIAGNOSIS — Z21 Asymptomatic human immunodeficiency virus [HIV] infection status: Secondary | ICD-10-CM | POA: Diagnosis not present

## 2020-07-23 LAB — CBC
HCT: 27.4 % — ABNORMAL LOW (ref 36.0–46.0)
Hemoglobin: 8.7 g/dL — ABNORMAL LOW (ref 12.0–15.0)
MCH: 29.4 pg (ref 26.0–34.0)
MCHC: 31.8 g/dL (ref 30.0–36.0)
MCV: 92.6 fL (ref 80.0–100.0)
Platelets: 233 10*3/uL (ref 150–400)
RBC: 2.96 MIL/uL — ABNORMAL LOW (ref 3.87–5.11)
RDW: 14.2 % (ref 11.5–15.5)
WBC: 4.9 10*3/uL (ref 4.0–10.5)
nRBC: 0 % (ref 0.0–0.2)

## 2020-07-23 LAB — BASIC METABOLIC PANEL
Anion gap: 12 (ref 5–15)
BUN: 19 mg/dL (ref 6–20)
CO2: 28 mmol/L (ref 22–32)
Calcium: 7.8 mg/dL — ABNORMAL LOW (ref 8.9–10.3)
Chloride: 97 mmol/L — ABNORMAL LOW (ref 98–111)
Creatinine, Ser: 4.48 mg/dL — ABNORMAL HIGH (ref 0.44–1.00)
GFR, Estimated: 12 mL/min — ABNORMAL LOW (ref >60)
Glucose, Bld: 97 mg/dL (ref 70–99)
Potassium: 3.2 mmol/L — ABNORMAL LOW (ref 3.5–5.1)
Sodium: 137 mmol/L (ref 135–145)

## 2020-07-23 LAB — TROPONIN I (HIGH SENSITIVITY)
Troponin I (High Sensitivity): 8 ng/L (ref <18)
Troponin I (High Sensitivity): 7 ng/L (ref <18)

## 2020-07-23 LAB — I-STAT BETA HCG BLOOD, ED (MC, WL, AP ONLY): I-stat hCG, quantitative: 6.3 m[IU]/mL — ABNORMAL HIGH (ref <5)

## 2020-07-23 NOTE — ED Notes (Signed)
EKG and labwork completed on pt because HR remains 120-130.

## 2020-07-23 NOTE — ED Triage Notes (Signed)
Pt reports bleeding to L forearm fistula since Wednesday.  Bleeding controlled at this time.

## 2020-07-24 NOTE — Discharge Instructions (Signed)
You were evaluated in the Emergency Department and after careful evaluation, we did not find any emergent condition requiring admission or further testing in the hospital.  Your exam/testing today was overall reassuring.  The vascular surgeons will reach out to for outpatient follow-up in possible fistulogram to further evaluate your graft.  Please return to the Emergency Department if you experience any worsening of your condition.  Thank you for allowing [place] to be a part of your care.

## 2020-07-24 NOTE — ED Provider Notes (Signed)
[facility] Emergency Department Provider Note MRN:  [id number]  Arrival date & time: 07/24/20     Chief Complaint   Bleeding from fistula History of Present Illness   Brooke Baldwin is a 44 y.o. year-old female with a history of hypertension, HIV, ESRD presenting to the ED with chief complaint of bleeding from fistula.  Patient had a fistula graft placed in her left forearm in late November.  After dialysis yesterday, despite normal pressure applied to the area she had continued bleeding.  This seemed abnormal and so she was sent to the emergency department.  She explains that the day prior, she woke up and noted some blood in her bed and did not know where it was coming from.  Has had some intermittent small amount of bleeding yesterday but it has since stopped and has not noticed any bleeding for the past 10 hours denies any other symptoms.  Review of Systems  A complete 10 system review of systems was obtained and all systems are negative except as noted in the HPI and PMH.   Patient's Health History    Past Medical History:  Diagnosis Date   HIV (human immunodeficiency virus infection) ([place])    Hypertension    Trigeminal neuralgia     Past Surgical History:  Procedure Laterality Date   ANKLE FRACTURE SURGERY Right    AV FISTULA PLACEMENT Left 07/04/2020   Procedure: INSERTION OF LEFT ARTERIOVENOUS (AV) GORE-TEX GRAFT ARM (ACUSEAL);  Surgeon: Cherre Robins, MD;  Location: [facility] OR;  Service: Vascular;  Laterality: Left;   CERVIX SURGERY     removal of HPV warts    Family History  Problem Relation Age of Onset   Diabetes Mother    Healthy Father     Social History   Socioeconomic History   Marital status: Single    Spouse name: Not on file   Number of children: 0   Years of education: college   Highest education level: Bachelor's degree (e.g., BA, AB, BS)  Occupational History   Occupation: Administrator, arts  Tobacco Use    Smoking status: Never Smoker   Smokeless tobacco: Never Used  Scientific laboratory technician Use: Never used  Substance and Sexual Activity   Alcohol use: Yes    Comment: occasional   Drug use: Not Currently   Sexual activity: Not Currently    Birth control/protection: None  Other Topics Concern   Not on file  Social History Narrative   Lives at home with significant other.   Right-handed.   No daily caffeine use.   Social Determinants of Health   Financial Resource Strain: Not on file  Food Insecurity: Not on file  Transportation Needs: Not on file  Physical Activity: Not on file  Stress: Not on file  Social Connections: Not on file  Intimate Partner Violence: Not on file     Physical Exam   Vitals:   07/24/20 0346 07/24/20 0351  BP: (!) 136/95 (!) 128/95  Pulse: (!) 113 (!) 116  Resp: 18 18  Temp: 97.8 F (36.6 C) 97.7 F (36.5 C)  SpO2: 99% 100%    CONSTITUTIONAL: Well-appearing, NAD NEURO:  Alert and oriented x 3, no focal deficits EYES:  eyes equal and reactive ENT/NECK:  no LAD, no JVD CARDIO: Regular rate, well-perfused, normal S1 and S2 PULM:  CTAB no wheezing or rhonchi GI/GU:  normal bowel sounds, non-distended, non-tender MSK/SPINE:  No gross deformities, no edema SKIN:  no rash,  atraumatic; graft palpable in left anterior forearm, palpable thrill, Steri-Strips in place.  No active bleeding. PSYCH:  Appropriate speech and behavior  *Additional and/or pertinent findings included in MDM below  Diagnostic and Interventional Summary    EKG Interpretation  Date/Time:    Ventricular Rate:    PR Interval:    QRS Duration:   QT Interval:    QTC Calculation:   R Axis:     Text Interpretation:        Labs Reviewed  BASIC METABOLIC PANEL - Abnormal; Notable for the following components:      Result Value   Potassium 3.2 (*)    Chloride 97 (*)    Creatinine, Ser 4.48 (*)    Calcium 7.8 (*)    GFR, Estimated 12 (*)    All other components within  normal limits  CBC - Abnormal; Notable for the following components:   RBC 2.96 (*)    Hemoglobin 8.7 (*)    HCT 27.4 (*)    All other components within normal limits  I-STAT BETA HCG BLOOD, ED ([facility], [facility], [facility] ONLY) - Abnormal; Notable for the following components:   I-stat hCG, quantitative 6.3 (*)    All other components within normal limits  TROPONIN I (HIGH SENSITIVITY)  TROPONIN I (HIGH SENSITIVITY)    No orders to display    Medications - No data to display   Procedures  /  Critical Care Procedures  ED Course and Medical Decision Making  I have reviewed the triage vital signs, the nursing notes, and pertinent available records from the EMR.  Listed above are laboratory and imaging tests that I personally ordered, reviewed, and interpreted and then considered in my medical decision making (see below for details).  Will touch base with vascular surgery given the relatively new graft and the abnormal bleeding.     Discussed case with Dr. Carlis Abbott of vascular surgery, given that the arm is not swollen or painful, there is a good thrill, and there is no bleeding over the past 8 to 10 hours, patient is appropriate for discharge with close follow-up.  May need outpatient fistulogram.  Barth Kirks. [place]onia Small, [place] [email or web address]  Final Clinical Impressions(s) / ED Diagnoses     ICD-10-CM   1. Bleeding from dialysis shunt, initial encounter [facility])  O29.476L     ED Discharge Orders    None       Discharge Instructions Discussed with and Provided to Patient:     Discharge Instructions     You were evaluated in the Emergency Department and after careful evaluation, we did not find any emergent condition requiring admission or further testing in the hospital.  Your exam/testing today was overall reassuring.  The vascular surgeons will reach out to for outpatient follow-up in possible fistulogram to further evaluate your  graft.  Please return to the Emergency Department if you experience any worsening of your condition.  Thank you for allowing [place] to be a part of your care.        Maudie Flakes, MD 07/24/20 [phone]

## 2020-07-25 ENCOUNTER — Other Ambulatory Visit: Payer: Self-pay

## 2020-07-25 ENCOUNTER — Telehealth: Payer: Self-pay

## 2020-07-25 NOTE — Telephone Encounter (Signed)
Patient was sent to ED on Saturday from HD for bleeding graft site s/p 11/29. Sat in ED for 16 hours - recommendation for close follow up. Patient states that site started bleeding again last night. Sometimes her fingers are cold. Arm is swollen - relieved somewhat by elevation. Put patient on schedule for today.

## 2020-07-25 NOTE — Telephone Encounter (Signed)
Spoke with pt regarding symptoms and staff msg received per Dr. Carlis Abbott to schedule pt for fistulogram on Thursday. Pt agreeable to procedure and appointment information provided. [address] office visit with PA for today. Pt verbalized understanding.

## 2020-07-27 ENCOUNTER — Encounter: Payer: Self-pay | Admitting: Internal Medicine

## 2020-07-27 ENCOUNTER — Other Ambulatory Visit (HOSPITAL_COMMUNITY)
Admission: RE | Admit: 2020-07-27 | Discharge: 2020-07-27 | Disposition: A | Discharge status: Home / Self Care | Payer: POS | Source: Ambulatory Visit | Attending: Vascular Surgery | Admitting: Vascular Surgery

## 2020-07-27 ENCOUNTER — Inpatient Hospital Stay: Payer: Medicare Other | Admitting: Internal Medicine

## 2020-07-27 ENCOUNTER — Ambulatory Visit: Payer: Medicare Other | Admitting: Pharmacist

## 2020-07-27 DIAGNOSIS — Z01812 Encounter for preprocedural laboratory examination: Secondary | ICD-10-CM | POA: Insufficient documentation

## 2020-07-27 DIAGNOSIS — Z7185 Encounter for immunization safety counseling: Secondary | ICD-10-CM | POA: Insufficient documentation

## 2020-07-27 DIAGNOSIS — Z20822 Contact with and (suspected) exposure to covid-19: Secondary | ICD-10-CM | POA: Insufficient documentation

## 2020-07-27 DIAGNOSIS — Z298 Encounter for other specified prophylactic measures: Secondary | ICD-10-CM | POA: Insufficient documentation

## 2020-07-27 LAB — SARS CORONAVIRUS 2 (TAT 6-24 HRS): SARS Coronavirus 2: NEGATIVE

## 2020-07-27 NOTE — Assessment & Plan Note (Deleted)
biktarvy atova  labs

## 2020-07-27 NOTE — Progress Notes (Deleted)
[place] for Infectious Disease  Reason for Consult: HIV new patient Referring Provider: Hospital follow up   HPI:    Brooke Baldwin is a 44 y.o. female with a past medical history as below who presents to clinic as a new patient for HIV care.    She previously was in care in [place] with Dr Jolene Provost.  Moved to [place] last year and was not able to establish care.  Subsequently hospitalized last month for worsening renal function now requiring HD.  ID consultation done while in the hospital and she was started on Biktarvy plus Atovaquone for PCP prophylaxis.   HIV diagnosed:  Early 2000's.   CD4 at diagnosis:  Unknown, prior CD4 in 07/2018 = 61 (6.1%) VL at diagnosis: Unknown, prior VL in 07/2018 = 235,361 Recent CD4: <35 (2%) on 07/01/2020 Recent viral load: 2,420,000 on 07/03/20 Prior ART:   1. Atripla   2. Tivicay + Descovy Current ART: Biktarvy since 06/2020 Hx of OI: None per pt and prior records Hx of STI: None per pt and prior records  Genotype Hx: 07/2018: K103N, I13V  Labs to order: quantiferon Viral load cmp Genotype Lipids  a1c   Patient's Medications  New Prescriptions   No medications on file  Previous Medications   ATOVAQUONE (MEPRON) 750 MG/5ML SUSPENSION    Take 10 mLs (1,500 mg total) by mouth daily with breakfast.   BICTEGRAVIR-EMTRICITABINE-TENOFOVIR AF (BIKTARVY) 50-200-25 MG TABS TABLET    Take 1 tablet by mouth daily.   CALCIUM CARBONATE ANTACID (CALCIUM CARBONATE, DOSED IN MG ELEMENTAL CALCIUM,) 1250 MG/5ML SUSP    Take 5 mLs (500 mg of elemental calcium total) by mouth every 6 (six) hours as needed for indigestion.   CAMPHOR-MENTHOL (SARNA) LOTION    Apply 1 application topically every 8 (eight) hours as needed for itching.   MULTIVITAMIN (RENA-VIT) TABS TABLET    Take 1 tablet by mouth at bedtime.   SEVELAMER CARBONATE (RENVELA) 800 MG TABLET    Take 2 tablets (1,600 mg total) by mouth 3 (three) times daily with meals.   Modified Medications   No medications on file  Discontinued Medications   No medications on file      Past Medical History:  Diagnosis Date  . HIV (human immunodeficiency virus infection) ([place])   . Hypertension   . Thrush   . Trigeminal neuralgia     Social History   Tobacco Use  . Smoking status: Never Smoker  . Smokeless tobacco: Never Used  Vaping Use  . Vaping Use: Never used  Substance Use Topics  . Alcohol use: Yes    Comment: occasional  . Drug use: Not Currently    Family History  Problem Relation Age of Onset  . Diabetes Mother   . Healthy Father     Allergies  Allergen Reactions  . Baclofen Other (See Comments)    Caused intolerable dizziness   . Gabapentin Other (See Comments)    Caused intolerable dizziness  . Penicillins Other (See Comments)    Unknown childhood reaction     ROS   OBJECTIVE:    There were no vitals filed for this visit.   There is no height or weight on file to calculate BMI.   Physical Exam  Pertinent Labs and Microbiology CMP Latest Ref Rng & Units 07/23/2020 07/12/2020 07/11/2020  Glucose 70 - 99 mg/dL 97 77 87  BUN 6 - 20 mg/dL 19 64(H) 53(H)  Creatinine  0.44 - 1.00 mg/dL 4.48(H) 11.38(H) 10.16(H)  Sodium 135 - 145 mmol/L 137 140 139  Potassium 3.5 - 5.1 mmol/L 3.2(L) 4.2 4.0  Chloride 98 - 111 mmol/L 97(L) 102 101  CO2 22 - 32 mmol/L _0 Calcium 8.9 - 10.3 mg/dL 7.8(L) 7.9(L) 7.9(L)  Total Protein 6.5 - 8.1 g/dL - - -  Total Bilirubin 0.3 - 1.2 mg/dL - - -  Alkaline Phos 38 - 126 U/L - - -  AST 15 - 41 U/L - - -  ALT 0 - 44 U/L - - -   CBC Latest Ref Rng & Units 07/23/2020 07/12/2020 07/06/2020  WBC 4.0 - 10.5 K/uL 4.9 5.1 3.9(L)  Hemoglobin 12.0 - 15.0 g/dL 8.7(L) 8.7(L) 11.4(L)  Hematocrit 36.0 - 46.0 % 27.4(L) 27.0(L) 36.2  Platelets 150 - 400 K/uL 233 170 122(L)     Lab Results  Component Value Date   HIV1RNAQUANT 2,420,000 07/03/2020   CD4TABS <35 (L) 07/01/2020    RPR and STI Lab Results   Component Value Date   LABRPR NON REACTIVE 07/03/2020    No flowsheet data found.  Hepatitis B Lab Results  Component Value Date   HEPBSAG NON REACTIVE 07/03/2020   HEPBCAB NON REACTIVE 07/03/2020   Hepatitis C No results found for: HEPCAB, HCVRNAPCRQN Hepatitis A Lab Results  Component Value Date   HAV NON REACTIVE 07/03/2020   Lipids: No results found for: CHOL, TRIG, HDL, CHOLHDL, VLDL, LDLCALC    ASSESSMENT & PLAN:    No problem-specific Assessment & Plan notes found for this encounter.   No orders of the defined types were placed in this encounter.   Vaccines Influenza: give every year COVID: recommend vaccination if not already done Pneumovax-23: (if CD4 >200) give twice every 5 years apart before age 35, then once at age 20.  Give >8 weeks from [place] Prevnar-13: (preferably when CD4 >200) give once, give >1 year from last Pneumovax-23 Hepatitis A: give Havrix 2 dose series at 0 and 6-12 months if non-immune Hepatitis B: give Heplisav 2 dose series at 0 and 4 weeks if non-immune.  Repeat serology 2 months after vaccine and revaccinate if needed MenACWY: 2 dose primary series 8 weeks apart, then 1 dose booster every 5 years HPV: Gardasil-9 at 0, 2, and 6 months for ages 9-26 should be vaccinated.  Ages 18-45 should be offered if appropriate Tdap: give every 10 years Shingles: give Shingrix 2 dose series at 0 and 2-6 months if >50 years on ART with CD4 cell count >200 Varicella: primary vaccination may be considered in VZV seronegative persons aged >8 years (if CD4 >200)  MMR: vaccine should be given if born in [birth year] or after and do not have immunity (if CD4 >200)  Screening DEXA Scan: if age >92 Quantiferon: check at initiation of care Hepatitis C: check at initiation of care.  Screen annually if risk factors HLA B5701: check at initiation of care G6PD: check if starting therapy with oxidant drugs Lipids: check annually Urinalysis: check annually or  every 6 months if on tenofovir Hgb A1c: check annually  ASCVD Risk Score Consider high-intensity statin therapy if 10-year ASCVD risk score >7.5% The ASCVD Risk score Mikey Bussing DC Jr., et al., 2013) failed to calculate for the following reasons:   Cannot find a previous HDL lab   Cannot find a previous total cholesterol lab    Immunization History  Administered Date(s) Administered  . Influenza,inj,Quad PF,6+ Mos 07/06/2020    Immunizations Influenza:  07/06/2020 COVID: PPSV-23: PCV-13: Hep A: Hep B: MenACWY: HPV: Tdap: Shingles: Varicella: MMR:  Screening DEXA: Quantiferon: Hepatitis C: HLA B5701: G6PD: Lipids: UA: Hgb A1c:   [facility] 07/27/2020, 11:31 AM

## 2020-07-28 ENCOUNTER — Other Ambulatory Visit: Payer: Self-pay

## 2020-07-28 ENCOUNTER — Encounter (HOSPITAL_COMMUNITY)
Admission: RE | Disposition: A | Discharge status: Home / Self Care | Payer: Self-pay | Source: Home / Self Care | Attending: Vascular Surgery

## 2020-07-28 ENCOUNTER — Ambulatory Visit (HOSPITAL_COMMUNITY)
Admission: RE | Admit: 2020-07-28 | Discharge: 2020-07-28 | Disposition: A | Discharge status: Home / Self Care | Payer: POS | Attending: Vascular Surgery | Admitting: Vascular Surgery

## 2020-07-28 DIAGNOSIS — Z992 Dependence on renal dialysis: Secondary | ICD-10-CM | POA: Diagnosis not present

## 2020-07-28 DIAGNOSIS — Y841 Kidney dialysis as the cause of abnormal reaction of the patient, or of later complication, without mention of misadventure at the time of the procedure: Secondary | ICD-10-CM | POA: Diagnosis not present

## 2020-07-28 DIAGNOSIS — N186 End stage renal disease: Secondary | ICD-10-CM | POA: Insufficient documentation

## 2020-07-28 DIAGNOSIS — T82510A Breakdown (mechanical) of surgically created arteriovenous fistula, initial encounter: Secondary | ICD-10-CM | POA: Insufficient documentation

## 2020-07-28 DIAGNOSIS — Z21 Asymptomatic human immunodeficiency virus [HIV] infection status: Secondary | ICD-10-CM | POA: Insufficient documentation

## 2020-07-28 DIAGNOSIS — T82898A Other specified complication of vascular prosthetic devices, implants and grafts, initial encounter: Secondary | ICD-10-CM

## 2020-07-28 HISTORY — PX: PERIPHERAL VASCULAR BALLOON ANGIOPLASTY: CATH118281

## 2020-07-28 HISTORY — PX: A/V FISTULAGRAM: CATH118298

## 2020-07-28 LAB — POCT I-STAT, CHEM 8
BUN: 34 mg/dL — ABNORMAL HIGH (ref 6–20)
Calcium, Ion: 1.07 mmol/L — ABNORMAL LOW (ref 1.15–1.40)
Chloride: 100 mmol/L (ref 98–111)
Creatinine, Ser: 7.2 mg/dL — ABNORMAL HIGH (ref 0.44–1.00)
Glucose, Bld: 87 mg/dL (ref 70–99)
HCT: 22 % — ABNORMAL LOW (ref 36.0–46.0)
Hemoglobin: 7.5 g/dL — ABNORMAL LOW (ref 12.0–15.0)
Potassium: 3.4 mmol/L — ABNORMAL LOW (ref 3.5–5.1)
Sodium: 139 mmol/L (ref 135–145)
TCO2: 30 mmol/L (ref 22–32)

## 2020-07-28 SURGERY — A/V FISTULAGRAM
Anesthesia: LOCAL | Laterality: Left

## 2020-07-28 MED ORDER — LIDOCAINE HCL (PF) 1 % IJ SOLN
INTRAMUSCULAR | Status: AC
  Filled 2020-07-28: qty 30

## 2020-07-28 MED ORDER — HEPARIN SODIUM (PORCINE) 1000 UNIT/ML IJ SOLN
INTRAMUSCULAR | Status: DC | PRN
  Administered 2020-07-28: 3000 [IU] via INTRAVENOUS

## 2020-07-28 MED ORDER — HEPARIN (PORCINE) IN NACL 1000-0.9 UT/500ML-% IV SOLN
INTRAVENOUS | Status: AC
  Filled 2020-07-28: qty 500

## 2020-07-28 MED ORDER — SODIUM CHLORIDE 0.9% FLUSH: 3.0000 mL | INTRAVENOUS | Status: DC | PRN

## 2020-07-28 MED ORDER — HEPARIN (PORCINE) IN NACL 1000-0.9 UT/500ML-% IV SOLN
INTRAVENOUS | Status: DC | PRN
  Administered 2020-07-28: 500 mL

## 2020-07-28 MED ORDER — SODIUM CHLORIDE 0.9 % IV SOLN: 250.0000 mL | INTRAVENOUS | Status: DC | PRN

## 2020-07-28 MED ORDER — LIDOCAINE HCL (PF) 1 % IJ SOLN
INTRAMUSCULAR | Status: DC | PRN
  Administered 2020-07-28: 2 mL

## 2020-07-28 MED ORDER — SODIUM CHLORIDE 0.9% FLUSH: 3.0000 mL | Freq: Two times a day (BID) | INTRAVENOUS | Status: DC

## 2020-07-28 MED ORDER — IODIXANOL 320 MG/ML IV SOLN
INTRAVENOUS | Status: DC | PRN
  Administered 2020-07-28: 12:00:00 65 mL via INTRAVENOUS

## 2020-07-28 MED ORDER — HEPARIN SODIUM (PORCINE) 1000 UNIT/ML IJ SOLN
INTRAMUSCULAR | Status: AC
  Filled 2020-07-28: qty 1

## 2020-07-28 SURGICAL SUPPLY — 17 items
BAG SNAP BAND KOVER 36X36 (MISCELLANEOUS) ×2 IMPLANT
BALLN MUSTANG 5.0X20 75 (BALLOONS) ×2
BALLN MUSTANG 6.0X20 75 (BALLOONS) ×2
BALLOON MUSTANG 5.0X20 75 (BALLOONS) ×1 IMPLANT
BALLOON MUSTANG 6.0X20 75 (BALLOONS) ×1 IMPLANT
CATH ANGIO 5F BER2 65CM (CATHETERS) ×2 IMPLANT
COVER DOME SNAP 22 D (MISCELLANEOUS) ×2 IMPLANT
KIT ENCORE 26 ADVANTAGE (KITS) ×2 IMPLANT
KIT MICROPUNCTURE NIT STIFF (SHEATH) ×2 IMPLANT
PROTECTION STATION PRESSURIZED (MISCELLANEOUS) ×2
SHEATH PINNACLE R/O II 5F 6CM (SHEATH) ×2 IMPLANT
SHEATH PROBE COVER 6X72 (BAG) ×2 IMPLANT
STATION PROTECTION PRESSURIZED (MISCELLANEOUS) ×1 IMPLANT
STOPCOCK MORSE 400PSI 3WAY (MISCELLANEOUS) ×2 IMPLANT
TRAY PV CATH (CUSTOM PROCEDURE TRAY) ×2 IMPLANT
TUBING CIL FLEX 10 FLL-RA (TUBING) ×2 IMPLANT
WIRE BENTSON .035X145CM (WIRE) ×2 IMPLANT

## 2020-07-28 NOTE — Op Note (Signed)
    OPERATIVE NOTE   PROCEDURE: 1. left forearm arteriovenous graft cannulation under ultrasound guidance 2. left arm fistulogram including central venogram 3. left peripheral venoplasty of cephalic vein just proximal to antecubital fossa (5 mm x 20 mm Mustang and 6 mm x 20 mm Mustang)   PRE-OPERATIVE DIAGNOSIS: Malfunctioning left arteriovenous forearm AV graft  POST-OPERATIVE DIAGNOSIS: same as above   SURGEON: Marty Heck, MD  ANESTHESIA: local  ESTIMATED BLOOD LOSS: 5 cc  FINDING(S): 1. The left forearm loop graft was widely patent.  The venous outflow was through the cephalic vein and just above the antecubital fossa there appeared to be a focal area of thrombus that was flow-limiting.  Ultimately this lesion was crossed and angioplasty was performed with a 5 mm and 6 mm Mustang with excellent results and no residual stenosis.  There is a better thrill in the graft.  SPECIMEN(S):  None  CONTRAST:  cc  INDICATIONS: Brooke Baldwin is a 44 y.o. female who  presents with malfunctioning left forearm arteriovenous graft.  The patient is scheduled for left arm fistulogram.  The patient is aware the risks include but are not limited to: bleeding, infection, thrombosis of the cannulated access, and possible anaphylactic reaction to the contrast.  The patient is aware of the risks of the procedure and elects to proceed forward.  DESCRIPTION: After full informed written consent was obtained, the patient was brought back to the angiography suite and placed supine upon the angiography table.  The patient was connected to monitoring equipment.  The left arm was prepped and draped in the standard fashion for a left arm fistulogram.  Under ultrasound guidance, the arteriovenous graft was evaluated, it was patent, an image was saved.  It was cannulated with a micropuncture needle under ultrasound guidance.  The microwire was advanced into the fistula and the needle was exchanged for  the a microsheath, which was lodged 2 cm into the access.  The wire was removed and the sheath was connected to the IV extension tubing.  Hand injections were completed to image the access from the antecubitum up to the level of axilla.  The central venous structures were also imaged by hand injections.  Ultimately images showed a likely area of thrombus in cephalic vein just above the antecubital fossa that was flow-limiting.  I had to reaccess the graft in the other direction so I tied a 4-0 Monocryl pursestring down and removed the micro sheath.  I then reaccessed the fistula in the other direction toward the venous outflow with a micro access needle.  Ultimately after a micro wire and micro sheath were placed, I used a Bentson wire to exchange for a 5 [place] sheath.  3000 units IV heparin was given.  I then crossed the lesion in the cephalic vein with a Bentson wire and a BER 2 catheter.  Lesion was angioplastied with a 5 mm and 6 mm Mustang to nominal pressure for 2 minutes with excellent results.  A 4-0 Monocryl purse-string suture was sewn around the sheath.  The sheath was removed while tying down the suture.  A sterile bandage was applied to the puncture site.  COMPLICATIONS: None  CONDITION: Stable  Marty Heck, MD [facility] Office: [place]   07/28/2020 12:18 PM

## 2020-07-28 NOTE — H&P (Signed)
History and Physical Interval Note:  07/28/2020 10:54 AM  Brooke Baldwin  has presented today for surgery, with the diagnosis of complication of avg.  The various methods of treatment have been discussed with the patient and family. After consideration of risks, benefits and other options for treatment, the patient has consented to  Procedure(s): A/V FISTULAGRAM (N/A) as a surgical intervention.  The patient's history has been reviewed, patient examined, no change in status, stable for surgery.  I have reviewed the patient's chart and labs.  Questions were answered to the patient's satisfaction.     She is status post left forearm quick stick graft by Dr. Stanford Breed.  Came into the ED over the weekend with bleeding issues from the graft after dialysis.  We had recommended fistulogram as outpatient.  She has excellent thrill in the graft.  Her incisions are healing very nicely.  Sounds like her last few dialysis sessions have gone well.  Marty Heck  ASSESSMENT & PLAN:  Brooke Baldwin is a 44 y.o. 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 She has continued to improve from a steal standpoint. Tolerating HD well. Looks great. Follow up with me or PA in 3 months with repeat duplex of AVG.  SUBJECTIVE:  Left hand improving. Still having some clumsiness. Tolerating HD.  OBJECTIVE:  BP 129/82 (BP Location: Right Arm, Patient Position: Sitting, Cuff Size: Large)   Pulse (!) 105   Temp (!) 97.3 F (36.3 C)   Resp 20   Ht 5\' 2"  (1.575 m)   Wt 204 lb (92.5 kg)   LMP 04/02/2015 (Approximate)   SpO2 100%   BMI 37.31 kg/m   Left arm AVG with strong thrill Steri strips in place.  CBC Latest Ref Rng & Units 07/12/2020 07/06/2020 07/05/2020  WBC 4.0 - 10.5 K/uL 5.1 3.9(L) 2.9(L)  Hemoglobin 12.0 - 15.0 g/dL 8.7(L) 11.4(L) 10.5(L)  Hematocrit 36.0 - 46.0 % 27.0(L) 36.2 32.7(L)  Platelets 150 - 400 K/uL 170 122(L) 104(L)     CMP  Latest Ref Rng & Units 07/12/2020 07/11/2020 07/10/2020  Glucose 70 - 99 mg/dL 77 87 94  BUN 6 - 20 mg/dL 64(H) 53(H) 77(H)  Creatinine 0.44 - 1.00 mg/dL 11.38(H) 10.16(H) 13.10(H)  Sodium 135 - 145 mmol/L 140 139 141  Potassium 3.5 - 5.1 mmol/L 4.2 4.0 4.1  Chloride 98 - 111 mmol/L 102 101 104  CO2 22 - 32 mmol/L 25 26 23   Calcium 8.9 - 10.3 mg/dL 7.9(L) 7.9(L) 7.9(L)  Total Protein 6.5 - 8.1 g/dL - - -  Total Bilirubin 0.3 - 1.2 mg/dL - - -  Alkaline Phos 38 - 126 U/L - - -  AST 15 - 41 U/L - - -  ALT 0 - 44 U/L - - -    Estimated Creatinine Clearance: 6.6 mL/min (A) (by C-G formula based on SCr of 11.38 mg/dL (H)).  [name]. Stanford Breed, MD [facility] Phone Number: [phone] 07/19/2020 12:21 PM

## 2020-07-28 NOTE — Discharge Instructions (Signed)

## 2020-08-01 ENCOUNTER — Encounter (HOSPITAL_COMMUNITY): Payer: Self-pay | Admitting: Vascular Surgery

## 2020-08-02 ENCOUNTER — Encounter (HOSPITAL_COMMUNITY): Payer: Medicare Other

## 2020-08-12 ENCOUNTER — Inpatient Hospital Stay: Payer: Medicare Other | Admitting: Internal Medicine

## 2020-08-19 ENCOUNTER — Other Ambulatory Visit: Payer: Self-pay

## 2020-08-19 ENCOUNTER — Ambulatory Visit (INDEPENDENT_AMBULATORY_CARE_PROVIDER_SITE_OTHER): Payer: POS | Admitting: Internal Medicine

## 2020-08-19 ENCOUNTER — Encounter: Payer: Self-pay | Admitting: Internal Medicine

## 2020-08-19 VITALS — BP 108/76 | HR 116 | Temp 98.4°F | Ht 62.0 in | Wt 188.0 lb

## 2020-08-19 DIAGNOSIS — N186 End stage renal disease: Secondary | ICD-10-CM

## 2020-08-19 DIAGNOSIS — Z7185 Encounter for immunization safety counseling: Secondary | ICD-10-CM

## 2020-08-19 DIAGNOSIS — B20 Human immunodeficiency virus [HIV] disease: Secondary | ICD-10-CM | POA: Diagnosis not present

## 2020-08-19 DIAGNOSIS — Z298 Encounter for other specified prophylactic measures: Secondary | ICD-10-CM | POA: Diagnosis not present

## 2020-08-19 DIAGNOSIS — Z0000 Encounter for general adult medical examination without abnormal findings: Secondary | ICD-10-CM | POA: Diagnosis not present

## 2020-08-19 MED ORDER — ATOVAQUONE 750 MG/5ML PO SUSP: 1500.0000 mg | Freq: Every day | ORAL | 5 refills | Status: DC

## 2020-08-19 NOTE — Assessment & Plan Note (Signed)
We reviewed their immunization history and discussed current vaccine recommendations for people living with HIV.  PLAN: . Recommended COVID vaccination . Will work on other vaccines as she continues to follow up regularly

## 2020-08-19 NOTE — Progress Notes (Signed)
[facility] for Infectious Disease  Reason for Consult: HIV new patient  Referring Provider: Hospital follow up   HPI:    Brooke Baldwin is a 45 y.o. female with a past medical history as below who presents to clinic as a new patient for HIV care.    She previously was in care in [place] with Dr Jolene Provost.  Moved to  last year and was not able to establish care.  Subsequently hospitalized 06/30/20-07/12/20 for worsening renal function now requiring HD.  ID consultation done while in the hospital and she was started on Biktarvy plus Atovaquone for PCP prophylaxis.   HIV diagnosed:  Early 2000's.   CD4 at diagnosis:  Unknown, prior CD4 in 07/2018 = 61 (6.1%) VL at diagnosis: Unknown, prior VL in 07/2018 = 263,785 Recent CD4: <35 (2%) on 07/01/2020 Recent viral load: 2,420,000 on 07/03/20 Prior ART:   1. Atripla   2. Tivicay + Descovy Current ART: Biktarvy since 06/2020 Hx of OI: None per pt and prior records Hx of STI: None per pt and prior records  Genotype Hx: 07/2018: K103N, I13V   Patient's Medications  New Prescriptions   ATOVAQUONE (MEPRON) 750 MG/5ML SUSPENSION    Take 10 mLs (1,500 mg total) by mouth daily with breakfast.  Previous Medications   BICTEGRAVIR-EMTRICITABINE-TENOFOVIR AF (BIKTARVY) 50-200-25 MG TABS TABLET    Take 1 tablet by mouth daily.   NON FORMULARY    Renal vitamin at dialysis  Modified Medications   No medications on file  Discontinued Medications   No medications on file      Past Medical History:  Diagnosis Date  . HIV (human immunodeficiency virus infection) ([place])   . Hypertension   . 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 Thrush   . Trigeminal neuralgia     Social History   Tobacco Use  . Smoking status: Never Smoker  . Smokeless tobacco: Never Used  Vaping Use  . Vaping Use: Never used  Substance Use Topics  . Alcohol use: Not Currently    Comment: occasional  . Drug use:  Not Currently    Family History  Problem Relation Age of Onset  . Diabetes Mother   . Healthy Father     Allergies  Allergen Reactions  . Baclofen Other (See Comments)    Caused intolerable dizziness   . Gabapentin Other (See Comments)    Caused intolerable dizziness  . Penicillins Other (See Comments)    Unknown childhood reaction     Review of Systems  Constitutional: Negative for chills and fever.  Respiratory: Positive for cough. Negative for sputum production and shortness of breath.   Cardiovascular: Negative for chest pain.  Gastrointestinal: Negative.   Musculoskeletal: Negative.        Swelling of left UE     OBJECTIVE:    Vitals:   08/19/20 1435  BP: 108/76  Pulse: (!) 116  Temp: 98.4 F (36.9 C)  TempSrc: Oral  SpO2: 99%  Weight: 188 lb (85.3 kg)  Height: 5\' 2"  (1.575 m)     Body mass index is 34.39 kg/m.   Physical Exam Constitutional:      General: She is not in acute distress.    Appearance: Normal appearance.  HENT:     Head: Normocephalic and atraumatic.  Cardiovascular:     Comments: Right sided HD catheter. Pulmonary:     Effort: Pulmonary effort is normal. No respiratory distress.  Musculoskeletal:        General: Swelling (of left upper extremity) present.  Neurological:     General: No focal deficit present.     Mental Status: She is alert and oriented to person, place, and time.  Psychiatric:        Mood and Affect: Mood normal.        Behavior: Behavior normal.     Pertinent Labs and Microbiology CMP Latest Ref Rng & Units 07/28/2020 07/23/2020 07/12/2020  Glucose 70 - 99 mg/dL 87 97 77  BUN 6 - 20 mg/dL 34(H) 19 64(H)  Creatinine 0.44 - 1.00 mg/dL 7.20(H) 4.48(H) 11.38(H)  Sodium 135 - 145 mmol/L 139 137 140  Potassium 3.5 - 5.1 mmol/L 3.4(L) 3.2(L) 4.2  Chloride 98 - 111 mmol/L 100 97(L) 102  CO2 22 - 32 mmol/L - 28 25  Calcium 8.9 - 10.3 mg/dL - 7.8(L) 7.9(L)  Total Protein 6.5 - 8.1 g/dL - - -  Total Bilirubin  0.3 - 1.2 mg/dL - - -  Alkaline Phos 38 - 126 U/L - - -  AST 15 - 41 U/L - - -  ALT 0 - 44 U/L - - -   CBC Latest Ref Rng & Units 07/28/2020 07/23/2020 07/12/2020  WBC 4.0 - 10.5 K/uL - 4.9 5.1  Hemoglobin 12.0 - 15.0 g/dL 7.5(L) 8.7(L) 8.7(L)  Hematocrit 36.0 - 46.0 % 22.0(L) 27.4(L) 27.0(L)  Platelets 150 - 400 K/uL - 233 170     Lab Results  Component Value Date   HIV1RNAQUANT 2,420,000 07/03/2020   CD4TABS <35 (L) 07/01/2020    RPR and STI Lab Results  Component Value Date   LABRPR NON REACTIVE 07/03/2020    No flowsheet data found.  Hepatitis B Lab Results  Component Value Date   HEPBSAG NON REACTIVE 07/03/2020   HEPBCAB NON REACTIVE 07/03/2020   Hepatitis C No results found for: HEPCAB, HCVRNAPCRQN Hepatitis A Lab Results  Component Value Date   HAV NON REACTIVE 07/03/2020   Lipids: No results found for: CHOL, TRIG, HDL, CHOLHDL, VLDL, LDLCALC    ASSESSMENT & PLAN:    HIV (human immunodeficiency virus infection) ([place]) Brooke Baldwin is a 45 y.o. female with HIV infection who is currently on Biktarvy and Atovaquone for PCP prophylaxis.  She has been tolerating her Biktarvy and taking daily without side effects until this week when she ran out of medications and there was a delay in her prescription getting filled at [facility].  She reports that they called this morning and now have it available for her to pick up so she will go today to pick this up.  She also reports having atovaquone at home but has not been taking it.  Discussed that we'll interpret her labs with caution since she has been off meds for about 1 week.   PLAN: . Continue Biktarvy . Resume Atovaquone for PCP prophylaxis . Check viral load, genotype, CD4 count, CBC, CMP, lipids . 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  Currently, it is non-functioning and she  had to have a temporary catheter placed in her chest.   PLAN: . Continue HD TTS schedule . Follow up with [facility] regarding AV fistula issues   Vaccine counseling We reviewed their immunization history and discussed current vaccine recommendations for people living with HIV.  PLAN: . Recommended COVID vaccination .  Will work on other vaccines as she continues to follow up regularly  Need for pneumocystis prophylaxis Discussed the need for PCP prophylaxis given her low CD4 counts.  PLAN: . Continue Atovaquone . Monitor CD4 counts and viral load    Orders Placed This Encounter  Procedures  . HIV-1 Genotyping (RTI,PI,IN [facility])  . HIV-1 RNA quant-no reflex-bld  . COMPLETE METABOLIC PANEL WITH GFR  . CBC  . Lipid panel  . T-helper cells (CD4) count (not at [facility])     Raynelle Highland for Infectious [facility] 08/19/2020, 3:16 PM

## 2020-08-19 NOTE — Patient Instructions (Signed)
Thank you for coming to see me today. It was a pleasure seeing you.  Please pick up your prescriptions at the pharmacy and continue your Biktarvy and Atovaquone.  Please get your COVID vaccine as soon as possible.  Please follow-up with me in about 6 weeks.   If you have any questions or concerns, please do not hesitate to call the office at [phone].  Take Care,   Jule Ser, DO

## 2020-08-19 NOTE — Assessment & Plan Note (Signed)
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  Currently, it is non-functioning and she had to have a temporary catheter placed in her chest.   PLAN: . Continue HD TTS schedule . Follow up with [facility] regarding AV fistula issues

## 2020-08-19 NOTE — Assessment & Plan Note (Addendum)
Brooke Baldwin is a 45 y.o. female with HIV infection who is currently on Biktarvy and Atovaquone for PCP prophylaxis.  She has been tolerating her Biktarvy and taking daily without side effects until this week when she ran out of medications and there was a delay in her prescription getting filled at [facility].  She reports that they called this morning and now have it available for her to pick up so she will go today to pick this up.  She also reports having atovaquone at home but has not been taking it.  Discussed that we'll interpret her labs with caution since she has been off meds for about 1 week.   PLAN: . Continue Biktarvy . Resume Atovaquone for PCP prophylaxis . Check viral load, genotype, CD4 count, CBC, CMP, lipids . At follow up will need screening labs: Quantiferon, G6PD

## 2020-08-19 NOTE — Assessment & Plan Note (Signed)
Discussed the need for PCP prophylaxis given her low CD4 counts.  PLAN: . Continue Atovaquone . Monitor CD4 counts and viral load

## 2020-08-23 ENCOUNTER — Ambulatory Visit: Payer: Medicare Other

## 2020-08-25 ENCOUNTER — Telehealth: Payer: Self-pay | Admitting: *Deleted

## 2020-08-25 NOTE — Telephone Encounter (Signed)
Notes received sent to referral for scheduling [place] [phone]

## 2020-08-30 ENCOUNTER — Other Ambulatory Visit: Payer: Self-pay

## 2020-08-30 ENCOUNTER — Ambulatory Visit: Payer: Medicare Other

## 2020-08-30 LAB — T-HELPER CELLS (CD4) COUNT (NOT AT ARMC)
Absolute CD4: 58 cells/uL — ABNORMAL LOW (ref 490–1740)
CD4 T Helper %: 4 % — ABNORMAL LOW (ref 30–61)
Total lymphocyte count: 1566 cells/uL (ref 850–3900)

## 2020-08-30 LAB — HIV-1 GENOTYPING (RTI,PI,IN INHBTR): HIV-1 Genotype: DETECTED — AB

## 2020-08-30 LAB — COMPLETE METABOLIC PANEL WITH GFR
AG Ratio: 0.7 (calc) — ABNORMAL LOW (ref 1.0–2.5)
ALT: 44 U/L — ABNORMAL HIGH (ref 6–29)
AST: 96 U/L — ABNORMAL HIGH (ref 10–35)
Albumin: 2.6 g/dL — ABNORMAL LOW (ref 3.6–5.1)
Alkaline phosphatase (APISO): 418 U/L — ABNORMAL HIGH (ref 31–125)
BUN/Creatinine Ratio: 4 (calc) — ABNORMAL LOW (ref 6–22)
BUN: 16 mg/dL (ref 7–25)
CO2: 33 mmol/L — ABNORMAL HIGH (ref 20–32)
Calcium: 8.3 mg/dL — ABNORMAL LOW (ref 8.6–10.2)
Chloride: 101 mmol/L (ref 98–110)
Creat: 4.26 mg/dL — ABNORMAL HIGH (ref 0.50–1.10)
GFR, Est African American: 14 mL/min/{1.73_m2} — ABNORMAL LOW (ref >=60)
GFR, Est Non African American: 12 mL/min/{1.73_m2} — ABNORMAL LOW (ref >=60)
Globulin: 3.8 g/dL (calc) — ABNORMAL HIGH (ref 1.9–3.7)
Glucose, Bld: 85 mg/dL (ref 65–99)
Potassium: 3.1 mmol/L — ABNORMAL LOW (ref 3.5–5.3)
Sodium: 141 mmol/L (ref 135–146)
Total Bilirubin: 0.5 mg/dL (ref 0.2–1.2)
Total Protein: 6.4 g/dL (ref 6.1–8.1)

## 2020-08-30 LAB — CBC
HCT: 24.7 % — ABNORMAL LOW (ref 35.0–45.0)
Hemoglobin: 8 g/dL — ABNORMAL LOW (ref 11.7–15.5)
MCH: 29.6 pg (ref 27.0–33.0)
MCHC: 32.4 g/dL (ref 32.0–36.0)
MCV: 91.5 fL (ref 80.0–100.0)
MPV: 9.3 fL (ref 7.5–12.5)
Platelets: 256 10*3/uL (ref 140–400)
RBC: 2.7 10*6/uL — ABNORMAL LOW (ref 3.80–5.10)
RDW: 14.2 % (ref 11.0–15.0)
WBC: 3.4 10*3/uL — ABNORMAL LOW (ref 3.8–10.8)

## 2020-08-30 LAB — LIPID PANEL
Cholesterol: 159 mg/dL (ref <200)
HDL: 71 mg/dL (ref >=50)
LDL Cholesterol (Calc): 73 mg/dL (calc)
Non-HDL Cholesterol (Calc): 88 mg/dL (calc) (ref <130)
Total CHOL/HDL Ratio: 2.2 (calc) (ref <5.0)
Triglycerides: 73 mg/dL (ref <150)

## 2020-08-30 LAB — HIV-1 RNA QUANT-NO REFLEX-BLD
HIV 1 RNA Quant: 1730 Copies/mL — ABNORMAL HIGH
HIV-1 RNA Quant, Log: 3.24 Log cps/mL — ABNORMAL HIGH

## 2020-08-30 NOTE — Progress Notes (Incomplete)
Mental Health Therapist Progress Note   Name: Sinia Antosh  Total time: 60  Type of Service: Comprehensive Clinical Assessment  OBJECTIVE:  Mood: Depressed and Affect: Tearful Risk of harm to self or others: No plan to harm self or others  DIAGNOSIS: Adjustment Disorder with Depressed Mood  Therapist met with client for the purpose of completing a comprehensive clinical assessment. The CCA is based on self-report from the individual, and will consider available information from family, acquaintances, and other professionals. Though efforts are made to encourage full and accurate disclosure by those involved, such disclosure is not assured.  If conflicting evidence becomes available, additional contact can be made with the individual and results/recommendations can be appropriately amended.  Client is a 45 year old, single, African American female, presenting for assessment.  Antoine Poche reports that she moved to [facility] from [place] Exam: Client was alert, oriented x4, with no reported current SI, HI, or current symptoms of psychosis (risk low).  Affect was congruent. Speech was appropriate in tone and content. No observed memory impairments. Client engaged openly and appropriately with clinician.  Hughes Better, LCSW

## 2020-09-06 ENCOUNTER — Other Ambulatory Visit: Payer: Self-pay

## 2020-09-06 ENCOUNTER — Ambulatory Visit: Payer: Medicare Other

## 2020-09-06 DIAGNOSIS — F4322 Adjustment disorder with anxiety: Secondary | ICD-10-CM

## 2020-09-06 NOTE — Progress Notes (Signed)
Mental Health Therapist Progress Note   Name: Brooke Baldwin  Total time: 30    Counselor began the session by checking in with client. Client shared that she has reached her "breaking point" regarding her relationship with her ex-fianc and her mother. SA shared that her she feels constantly disrespected by her ex-fianc regarding how he treats her as a person outside of her previous role as his partner. Counselor reflected feelings of tiredness and overwhelming stress as client expressed that she no longer wants to be in a relationship with her ex-partner. Client agreed and expressed that she is extremely disappointed in him and has decided that she is no longer going to put her worth and values on the "backburner". Counselor validated client's feelings to choose her peace and "sanity". Client then shared that the "drama" she is experiencing with her ex-fianc has created a significant amount of tension between herself and her mother due to him sharing their personal business with her mother. When asked how this makes her feel, client shared that her mother constantly chooses her partner, stating "my mother's loyalty has never been to me".  Counselor reflected feelings of deep pain and hurt while reflecting that client felt "as if it was her (SA) against the world". Client agreed and shared that her only desire is to heal, grow, and become better version of herself. Counselor and client closed session with clarifying goals for future sessions in which SA stated her relationship with her mom has significantly impacted how she views herself and how she feels on a daily basis.  Client was oriented 3x (location, time, place). Client's overall mood was content, and her affect was congruent. Client is diagnosed with adjustment disorder. Client engaged openly and appropriately when discussing her current relationship status with her mother and ex-fianc. Client benefited from counselor reflectively  listening and providing a space for her to discuss the frustration and disappointment that she is experiencing. Counselor and client will meet next week.  Jessica Priest, LCSW

## 2020-09-08 ENCOUNTER — Encounter: Payer: Self-pay | Admitting: Internal Medicine

## 2020-09-08 DIAGNOSIS — E6609 Other obesity due to excess calories: Secondary | ICD-10-CM | POA: Insufficient documentation

## 2020-09-08 DIAGNOSIS — R002 Palpitations: Secondary | ICD-10-CM | POA: Insufficient documentation

## 2020-09-08 DIAGNOSIS — I10 Essential (primary) hypertension: Secondary | ICD-10-CM | POA: Insufficient documentation

## 2020-09-13 ENCOUNTER — Ambulatory Visit: Payer: Medicare Other

## 2020-09-13 ENCOUNTER — Other Ambulatory Visit: Payer: Self-pay

## 2020-09-20 ENCOUNTER — Ambulatory Visit: Payer: Medicare Other

## 2020-09-27 ENCOUNTER — Ambulatory Visit: Payer: Medicare Other

## 2020-09-30 ENCOUNTER — Other Ambulatory Visit: Payer: Self-pay

## 2020-09-30 ENCOUNTER — Telehealth (INDEPENDENT_AMBULATORY_CARE_PROVIDER_SITE_OTHER): Payer: POS | Admitting: Internal Medicine

## 2020-09-30 ENCOUNTER — Encounter: Payer: Self-pay | Admitting: Internal Medicine

## 2020-09-30 DIAGNOSIS — R002 Palpitations: Secondary | ICD-10-CM | POA: Diagnosis not present

## 2020-09-30 DIAGNOSIS — B20 Human immunodeficiency virus [HIV] disease: Secondary | ICD-10-CM | POA: Diagnosis not present

## 2020-09-30 DIAGNOSIS — Z23 Encounter for immunization: Secondary | ICD-10-CM | POA: Insufficient documentation

## 2020-09-30 DIAGNOSIS — Z298 Encounter for other specified prophylactic measures: Secondary | ICD-10-CM

## 2020-09-30 NOTE — Assessment & Plan Note (Signed)
Discussed need for PCP prophylaxis.  She has not been taking her atovaquone but states she is going to pharmacy today to pick up refill  PLAN: . Continue Atovaquone . Monitor CD4 and viral load.

## 2020-09-30 NOTE — Assessment & Plan Note (Signed)
Has been experiencing symptoms of unclear etiology.  Currently being worked up by [facility] cardiology.  PLAN: . Continue to follow up with Cardiology

## 2020-09-30 NOTE — Progress Notes (Signed)
[facility] for Infectious Disease   CHIEF COMPLAINT    HIV follow up.  Last seen by me on 08/19/2020  SUBJECTIVE:    Brooke Baldwin is a 45 y.o. female with PMHx as below who presents to the clinic for HIV follow up.  Since her last visit, she has been evaluated at [facility] for palpitations and tachycardia of unclear etiology.  They are planning for ZIO Patch and echocardiogram for further work-up.  She also met with our counselor here in the clinic on February 1.  Her last labs are from January 14.  HIV RNA viral load 1730 copies.  Resistance testing done did not reveal any mutations.  CD4 count 58 (4%).  Scheduled for in person visit but converted to televisit due to patient transportation issues.   Please see A&P for the details of today's visit and status of the patient's medical problems.   Patient's Medications  New Prescriptions   No medications on file  Previous Medications   ATOVAQUONE (MEPRON) 750 MG/5ML SUSPENSION    Take 10 mLs (1,500 mg total) by mouth daily with breakfast.   BICTEGRAVIR-EMTRICITABINE-TENOFOVIR AF (BIKTARVY) 50-200-25 MG TABS TABLET    Take 1 tablet by mouth daily.   NON FORMULARY    Renal vitamin at dialysis  Modified Medications   No medications on file  Discontinued Medications   No medications on file      Past Medical History:  Diagnosis Date  . HIV (human immunodeficiency virus infection) ([place])   . Hypertension   . 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 Thrush   . Trigeminal neuralgia     Social History   Tobacco Use  . Smoking status: Never Smoker  . Smokeless tobacco: Never Used  Vaping Use  . Vaping Use: Never used  Substance Use Topics  . Alcohol use: Not Currently    Comment: occasional  . Drug use: Not Currently    Family History  Problem Relation Age of Onset  . Diabetes Mother   . Healthy Father     Allergies  Allergen Reactions  . Baclofen Other (See Comments)     Caused intolerable dizziness   . Gabapentin Other (See Comments)    Caused intolerable dizziness  . Penicillins Other (See Comments)    Unknown childhood reaction     Review of Systems  Constitutional: Negative.   Respiratory: Negative.   Cardiovascular: Positive for palpitations.  Gastrointestinal: Negative.   Genitourinary: Negative.      OBJECTIVE:    There were no vitals filed for this visit.   There is no height or weight on file to calculate BMI.  Physical Exam Conversant on the phone.  Sounded great.    Labs and Microbiology: CMP Latest Ref Rng & Units 08/19/2020 07/28/2020 07/23/2020  Glucose 65 - 99 mg/dL 85 87 97  BUN 7 - 25 mg/dL 16 34(H) 19  Creatinine 0.50 - 1.10 mg/dL 4.26(H) 7.20(H) 4.48(H)  Sodium 135 - 146 mmol/L 141 139 137  Potassium 3.5 - 5.3 mmol/L 3.1(L) 3.4(L) 3.2(L)  Chloride 98 - 110 mmol/L 101 100 97(L)  CO2 20 - 32 mmol/L 33(H) - 28  Calcium 8.6 - 10.2 mg/dL 8.3(L) - 7.8(L)  Total Protein 6.1 - 8.1 g/dL 6.4 - -  Total Bilirubin 0.2 - 1.2 mg/dL 0.5 - -  Alkaline Phos 38 - 126 U/L - - -  AST 10 - 35 U/L 96(H) - -  ALT  6 - 29 U/L 44(H) - -   CBC Latest Ref Rng & Units 08/19/2020 07/28/2020 07/23/2020  WBC 3.8 - 10.8 Thousand/uL 3.4(L) - 4.9  Hemoglobin 11.7 - 15.5 g/dL 8.0(L) 7.5(L) 8.7(L)  Hematocrit 35.0 - 45.0 % 24.7(L) 22.0(L) 27.4(L)  Platelets 140 - 400 Thousand/uL 256 - 233     Lab Results  Component Value Date   HIV1RNAQUANT 1,730 (H) 08/19/2020   HIV1RNAQUANT 2,420,000 07/03/2020   CD4TABS <35 (L) 07/01/2020    RPR and STI: Lab Results  Component Value Date   LABRPR NON REACTIVE 07/03/2020    No flowsheet data found.  Hepatitis B: Lab Results  Component Value Date   HEPBSAG NON REACTIVE 07/03/2020   HEPBCAB NON REACTIVE 07/03/2020   Hepatitis C: No results found for: HEPCAB, HCVRNAPCRQN Hepatitis A: Lab Results  Component Value Date   HAV NON REACTIVE 07/03/2020   Lipids: Lab Results  Component Value  Date   CHOL 159 08/19/2020   TRIG 73 08/19/2020   HDL 71 08/19/2020   CHOLHDL 2.2 08/19/2020   [place] 73 08/19/2020     ASSESSMENT & PLAN:    HIV (human immunodeficiency virus infection) ([place]) Brooke Baldwin is a 45 y.o. female with HIV infection who is doing very well on [place] for ART regimen.  Her viral load has been coming down and resistance testing showed no mutations that would impact her regimen.  She reports since last seeing me that she has been taking daily without issues.  She has shown some evidence of immune reconstitution with improved CD4  PLAN: . Continue Biktarvy . RTC 2 months for labs and OV      Palpitations Has been experiencing symptoms of unclear etiology.  Currently being worked up by [facility] cardiology.  PLAN: . Continue to follow up with Cardiology   Need for pneumocystis prophylaxis Discussed need for PCP prophylaxis.  She has not been taking her atovaquone but states she is going to pharmacy today to pick up refill  PLAN: . Continue Atovaquone . Monitor CD4 and viral load.     No orders of the defined types were placed in this encounter.    Raynelle Highland for Infectious Disease [facility] 09/30/2020, 9:05 AM   Virtual Visit via Telephone Note   I connected with Brooke Baldwin on 09/30/20 at 9:05 AM by telephone and verified that I am speaking with the correct person using two identifiers.   I discussed the limitations, risks, security and privacy concerns of performing an evaluation and management service by telephone and the availability of in person appointments. I also discussed with the patient that there may be a patient responsible charge related to this service. The patient expressed understanding and agreed to proceed.  Patient location: home My location: [facility] Duration of call and time on visit: 15 min

## 2020-09-30 NOTE — Assessment & Plan Note (Signed)
Brooke Baldwin is a 45 y.o. female with HIV infection who is doing very well on Biktarvy for ART regimen.  Her viral load has been coming down and resistance testing showed no mutations that would impact her regimen.  She reports since last seeing me that she has been taking daily without issues.  She has shown some evidence of immune reconstitution with improved CD4  PLAN: . Continue Biktarvy . RTC 2 months for labs and OV

## 2020-10-03 ENCOUNTER — Other Ambulatory Visit: Payer: Self-pay

## 2020-10-03 ENCOUNTER — Ambulatory Visit (AMBULATORY_SURGERY_CENTER): Payer: Self-pay | Admitting: *Deleted

## 2020-10-03 VITALS — Ht 62.0 in | Wt 174.0 lb

## 2020-10-03 DIAGNOSIS — Z1211 Encounter for screening for malignant neoplasm of colon: Secondary | ICD-10-CM

## 2020-10-03 NOTE — Progress Notes (Signed)
Patient is here late in-person for PV. Patient denies any allergies to eggs or soy. Patient denies any problems with anesthesia/sedation. Patient denies any oxygen use at home. Patient denies taking any diet/weight loss medications or blood thinners. Patient is not being treated for MRSA or C-diff. Patient is aware of our care-partner policy and [id number]-[id number] safety protocol. EMMI education assigned to the patient for the procedure, sent to [place]. The patient states she is having an heart echo on 3/4, release form given to the patient for her to give to the clinic requesting echo results be faxed to [place] before the colon. Patient is aware we must have results or the colon will need to be cancelled.

## 2020-10-04 ENCOUNTER — Other Ambulatory Visit: Payer: Self-pay

## 2020-10-04 DIAGNOSIS — N186 End stage renal disease: Secondary | ICD-10-CM

## 2020-10-10 ENCOUNTER — Ambulatory Visit (HOSPITAL_COMMUNITY)
Admission: RE | Admit: 2020-10-10 | Discharge: 2020-10-10 | Disposition: A | Discharge status: Home / Self Care | Payer: POS | Source: Ambulatory Visit | Attending: Vascular Surgery | Admitting: Vascular Surgery

## 2020-10-10 ENCOUNTER — Ambulatory Visit (INDEPENDENT_AMBULATORY_CARE_PROVIDER_SITE_OTHER)
Admission: RE | Admit: 2020-10-10 | Discharge: 2020-10-10 | Disposition: A | Discharge status: Home / Self Care | Payer: POS | Source: Ambulatory Visit | Attending: Vascular Surgery | Admitting: Vascular Surgery

## 2020-10-10 ENCOUNTER — Encounter: Payer: Self-pay | Admitting: Vascular Surgery

## 2020-10-10 ENCOUNTER — Ambulatory Visit (INDEPENDENT_AMBULATORY_CARE_PROVIDER_SITE_OTHER): Payer: POS | Admitting: Vascular Surgery

## 2020-10-10 ENCOUNTER — Encounter: Payer: Self-pay | Admitting: Gynecologic Oncology

## 2020-10-10 ENCOUNTER — Other Ambulatory Visit: Payer: Self-pay

## 2020-10-10 ENCOUNTER — Telehealth: Payer: Self-pay | Admitting: *Deleted

## 2020-10-10 VITALS — BP 119/88 | HR 90 | Temp 97.8°F | Resp 20 | Ht 62.0 in | Wt 176.0 lb

## 2020-10-10 DIAGNOSIS — Z992 Dependence on renal dialysis: Secondary | ICD-10-CM

## 2020-10-10 DIAGNOSIS — N186 End stage renal disease: Secondary | ICD-10-CM

## 2020-10-10 NOTE — Telephone Encounter (Signed)
Scheduled the patient for a new patient appt for 3/8 with Dr Denman George at 9:45 am; patient to arrive at 9:15 am. Patient given the address and phone number for the clinic. Patient also given the policy for mask and visitors

## 2020-10-10 NOTE — Progress Notes (Signed)
Patient ID: Brooke Baldwin, female   DOB: 02-09-1976, 45 y.o.   MRN: [id number]  Reason for Consult: Follow-up   Referred by Rexene Agent, MD  Subjective:     HPI:  Brooke Baldwin is a 45 y.o. female on dialysis Tuesdays, Thursdays and Saturdays via right IJ tunneled catheter.  She previously had a left forearm loop graft placed which subsequently thrombosed she did undergo thrombectomy rethrombosed.  She does have persistent hand pain and associated coolness on the left.  She is right-hand dominant.  She has skin changes with darkening discoloration over the graft and on the medial upper arm but no tissue loss or ulceration in her hand.  She is here today to discuss further dialysis access.  She states that she is being considered for peritoneal dialysis.  Past Medical History:  Diagnosis Date  . Chronic kidney disease    diaylsis T-Th-SAT  . HIV (human immunodeficiency virus infection) ([place])   . Hypertension   . 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 Thrush   . Trigeminal neuralgia    Family History  Problem Relation Age of Onset  . Diabetes Mother   . Healthy Father   . Breast cancer Maternal Aunt   . Pancreatic cancer Maternal Grandfather   . Colon cancer Neg Hx   . Colon polyps Neg Hx   . Esophageal cancer Neg Hx   . Rectal cancer Neg Hx   . Stomach cancer Neg Hx    Past Surgical History:  Procedure Laterality Date  . A/V FISTULAGRAM Left 07/28/2020   Procedure: A/V FISTULAGRAM;  Surgeon: Marty Heck, MD;  Location: [place] [facility];  Service: Cardiovascular;  Laterality: Left;  . ANKLE FRACTURE SURGERY Right   . AV FISTULA PLACEMENT Left 07/04/2020   Procedure: INSERTION OF LEFT ARTERIOVENOUS (AV) GORE-TEX GRAFT ARM (ACUSEAL);  Surgeon: Cherre Robins, MD;  [facility]: [facility];  Service: Vascular;  Laterality: Left;  . CERVIX SURGERY     removal of HPV warts  . PERIPHERAL VASCULAR BALLOON ANGIOPLASTY Left  07/28/2020   Procedure: PERIPHERAL VASCULAR BALLOON ANGIOPLASTY;  Surgeon: Marty Heck, MD;  Location: [place] [facility];  Service: Cardiovascular;  Laterality: Left;  AVF    Short Social History:  Social History   Tobacco Use  . Smoking status: Never Smoker  . Smokeless tobacco: Never Used  Substance Use Topics  . Alcohol use: Not Currently    Comment: occasional    Allergies  Allergen Reactions  . Baclofen Other (See Comments)    Caused intolerable dizziness   . Gabapentin Other (See Comments)    Caused intolerable dizziness  . Penicillins Other (See Comments)    Unknown childhood reaction     Current Outpatient Medications  Medication Sig Dispense Refill  . bictegravir-emtricitabine-tenofovir AF (BIKTARVY) 50-200-25 MG TABS tablet Take 1 tablet by mouth daily. 30 tablet 5  . NON FORMULARY Renal vitamin at dialysis    . atovaquone (MEPRON) 750 MG/5ML suspension Take 10 mLs (1,500 mg total) by mouth daily with breakfast. (Patient not taking: No sig reported) 300 mL 5   No current facility-administered medications for this visit.    Review of Systems  Constitutional:  Constitutional negative. HENT: HENT negative.  Eyes: Eyes negative.  Respiratory: Respiratory negative.  Cardiovascular: Cardiovascular negative.  GI: Gastrointestinal negative.  Musculoskeletal:       Left hand pain Neurological: Neurological negative. Hematologic: Hematologic/lymphatic negative.  Psychiatric: Psychiatric negative.  Objective:  Objective   Vitals:   10/10/20 1038  BP: 119/88  Pulse: 90  Resp: 20  Temp: 97.8 F (36.6 C)  SpO2: 100%  Weight: 176 lb (79.8 kg)  Height: 5\' 2"  (1.575 m)   Body mass index is 32.19 kg/m.  Physical Exam HENT:     Head: Normocephalic.     Nose:     Comments: Wearing a mask Eyes:     Pupils: Pupils are equal, round, and reactive to light.  Cardiovascular:     Pulses:          Radial pulses are 2+ on the right side and 1+  on the left side.  Pulmonary:     Effort: Pulmonary effort is normal.  Abdominal:     General: Abdomen is flat.     Palpations: Abdomen is soft.  Musculoskeletal:        General: Normal range of motion.  Skin:    Comments: Skin darkening around the graft in the left forearm and medial left upper arm  Neurological:     General: No focal deficit present.     Mental Status: She is alert.  Psychiatric:        Mood and Affect: Mood normal.        Behavior: Behavior normal.        Thought Content: Thought content normal.        Judgment: Judgment normal.     Data: +-----------------+-------------+----------+--------+  Right Cephalic  Diameter (cm)Depth (cm)Findings  +-----------------+-------------+----------+--------+  Shoulder       0.21              +-----------------+-------------+----------+--------+  Prox upper arm    0.22              +-----------------+-------------+----------+--------+  Mid upper arm    0.22              +-----------------+-------------+----------+--------+  Dist upper arm    0.21              +-----------------+-------------+----------+--------+  Antecubital fossa  0.13              +-----------------+-------------+----------+--------+  Prox forearm     0.16              +-----------------+-------------+----------+--------+  Mid forearm     0.28              +-----------------+-------------+----------+--------+  Dist forearm     0.23              +-----------------+-------------+----------+--------+   +-----------------+-------------+----------+--------+  Right Basilic  Diameter (cm)Depth (cm)Findings  +-----------------+-------------+----------+--------+  Prox upper arm    0.22              +-----------------+-------------+----------+--------+  Mid upper arm     0.37              +-----------------+-------------+----------+--------+  Dist upper arm    0.36              +-----------------+-------------+----------+--------+  Antecubital fossa  0.34              +-----------------+-------------+----------+--------+  Prox forearm     0.36              +-----------------+-------------+----------+--------+   +-----------------+-------------+----------+--------+  Left Cephalic  Diameter (cm)Depth (cm)Findings  +-----------------+-------------+----------+--------+  Prox upper arm    0.27        Thrombus  +-----------------+-------------+----------+--------+  Dist upper arm    0.21        Thrombus  +-----------------+-------------+----------+--------+  Antecubital fossa  0.32        Thrombus  +-----------------+-------------+----------+--------+   +-----------------+-------------+----------+--------+  Left Basilic   Diameter (cm)Depth (cm)Findings  +-----------------+-------------+----------+--------+  Prox upper arm    0.55              +-----------------+-------------+----------+--------+  Mid upper arm    0.45              +-----------------+-------------+----------+--------+  Dist upper arm    0.43              +-----------------+-------------+----------+--------+  Antecubital fossa  0.45              +-----------------+-------------+----------+--------+  Prox forearm     0.34              +-----------------+-------------+----------+--------+   Left arm cephalic vein appears to be thrombosed.  Summary: Right: Cephalic and Basilic veins are patent with diameters as     described above.  Left: Basilic vein is patent with diameters as described above. Left    cephalic vein appears to be thrombosed.    Right  Pre-Dialysis Findings:  +-----------------------+----------+--------------------+---------+--------  +  Location        PSV (cm/s)Intralum. Diam. (cm)Waveform  Comments  +-----------------------+----------+--------------------+---------+--------  +  Brachial Antecub. fossa54    0.36        triphasic        +-----------------------+----------+--------------------+---------+--------  +  Radial Art at Wrist  50    0.15        triphasic        +-----------------------+----------+--------------------+---------+--------  +  Ulnar Art at Wrist   49    0.17        triphasic        +-----------------------+----------+--------------------+---------+--------  +  Left Pre-Dialysis Findings:  +------------------+----------+-------------------+---------+--------------  ----+  Location     PSV (cm/s)Intralum. Diam.  Waveform Comments                      (cm)                         +------------------+----------+-------------------+---------+--------------  ----+  Brachial Antecub. 70    0.46        triphasicPan diastolic  flow  fossa                                       +------------------+----------+-------------------+---------+--------------  ----+  Radial Art at   49    0.15        triphasicPan diastolic  flow  Wrist                                       +------------------+----------+-------------------+---------+--------------  ----+  Ulnar Art at [id number]    0.19        biphasic Pan diastolic  flow  +------------------+----------+-------------------+---------+--------------  ----+      Assessment/Plan:     45 year old female with recently placed left forearm loop graft which has thrombosed and the  cephalic vein is also thrombosed.  I discussed with her placing basilic vein fistula versus upper arm loop graft.  She is having significant left hand discomfort she is interested in peritoneal dialysis.  Refer to occupational therapy for left upper extremity pain given that the flow appears preserved by duplex and by physical exam in  her left hand.  She is going to inquire further about peritoneal dialysis we could possibly place the PD catheter for her or she can call to schedule left upper extremity hemodialysis access when she is ready.     Waynetta Sandy MD [facility]

## 2020-10-11 ENCOUNTER — Encounter: Payer: Self-pay | Admitting: Gynecologic Oncology

## 2020-10-11 ENCOUNTER — Other Ambulatory Visit: Payer: Self-pay

## 2020-10-11 ENCOUNTER — Inpatient Hospital Stay: Payer: POS | Attending: Gynecologic Oncology | Admitting: Gynecologic Oncology

## 2020-10-11 VITALS — BP 133/91 | HR 100 | Temp 96.5°F | Resp 20 | Ht 62.0 in | Wt 174.2 lb

## 2020-10-11 DIAGNOSIS — Z992 Dependence on renal dialysis: Secondary | ICD-10-CM | POA: Insufficient documentation

## 2020-10-11 DIAGNOSIS — N870 Mild cervical dysplasia: Secondary | ICD-10-CM

## 2020-10-11 DIAGNOSIS — Z21 Asymptomatic human immunodeficiency virus [HIV] infection status: Secondary | ICD-10-CM | POA: Insufficient documentation

## 2020-10-11 DIAGNOSIS — N901 Moderate vulvar dysplasia: Secondary | ICD-10-CM | POA: Diagnosis present

## 2020-10-11 DIAGNOSIS — Z79899 Other long term (current) drug therapy: Secondary | ICD-10-CM | POA: Insufficient documentation

## 2020-10-11 DIAGNOSIS — N186 End stage renal disease: Secondary | ICD-10-CM | POA: Insufficient documentation

## 2020-10-11 NOTE — Patient Instructions (Addendum)
Preparing for your Surgery  Plan for surgery on October 19, 2020 with Dr. Everitt Amber at [facility]. You will be scheduled for a CO2 laser of the vulva.   We recommend purchasing several bags of frozen green peas and dividing them into ziploc bags. You will want to keep these in the freezer and have them ready to use as ice packs to the vulvar incision. Once the ice pack is no longer cold, you can get another from the freezer. The frozen peas mold to your body better than a regular ice pack.  Pre-operative Testing -You will receive a phone call from presurgical testing at [facility] to discuss preoperative instructions, labs if needed, and COVID test. The COVID test normally happens 3 days prior to the surgery and they ask that you self quarantine after the test up until surgery to decrease chance of exposure.  -Bring your insurance card, copy of an advanced directive if applicable, medication list  Day Before Surgery at [place] will be advised you can have clear liquids up until 3 hours before your surgery.    Your role in recovery Your role is to become active as soon as directed by your doctor, while still giving yourself time to heal.  Rest when you feel tired. You will be asked to do the following in order to speed your recovery:  - Cough and breathe deeply. This helps to clear and expand your lungs and can prevent pneumonia after surgery.  - [place]. Do mild physical activity. Walking or moving your legs help your circulation and body functions return to normal. Do not try to get up or walk alone the first time after surgery.   -If you develop swelling on one leg or the other, pain in the back of your leg, redness/warmth in one of your legs, please call the office or go to the Emergency Room to have a doppler to rule out a blood clot. For shortness of breath, chest pain-seek care in the Emergency Room as soon as possible. -  Actively manage your pain. Managing your pain lets you move in comfort. We will ask you to rate your pain on a scale of zero to 10. It is your responsibility to tell your doctor or nurse where and how much you hurt so your pain can be treated.  Bowel Regimen -It is important to prevent constipation and drink adequate amounts of liquids.   Risks of Surgery Risks of surgery are low but include bleeding, infection, damage to surrounding structures, re-operation, blood clots, and very rarely death.  Reasons to call the Doctor:  Fever - Oral temperature greater than 100.4 degrees Fahrenheit  Foul-smelling vaginal discharge  Difficulty urinating  Nausea and vomiting  Increased pain at the site of the incision that is unrelieved with pain medicine.  Difficulty breathing with or without chest pain  New calf pain especially if only on one side  Sudden, continuing increased vaginal bleeding with or without clots.   Contacts: For questions or concerns you should contact:  Dr. Everitt Amber at [phone]  Joylene John, NP at [phone]  After Hours: call [phone] and have the GYN Oncologist paged/contacted (after 5 pm or on the weekends).  Messages sent via mychart are for non-urgent matters and are not responded to after hours so for urgent needs, please call the after hours number.  Post Operative Instructions Following Laser Surgery  Laser treatment of condyloma (warts) is  used to vaporize or eliminate the wart.  The laser actually creates a burn effect on the skin to accomplish this.  The following instructions will help in you comfort postoperatively:  First 24 h  Sit in a tub or sitz bath of COOL water for 20 minutes 1-3 times a day.  After the bath, carefully blot the area dry and place Silvadene over vulva.  You may sit on a covered ice pack between the baths as needed or on a circular pillow.     If you become uncomfortable, call the office for instructions.   Take  your pain medications as prescribed  You may eat whatever you feel like.  Start with a light meal and gradually advance your diet.    Beginning the day after your surgery  Sit in a tub of cool to warm water for 15 minutes at least 3 times a day and after bowel movements  After the bowel movement, clean and dry the area.  Apply Silvadene to the vulva after your baths.    Drainage is usually a pink to tan color and is normal for the following 2-3 weeks after surgery. You can use a peripad to help collect any drainage.  Diet  Eat a regular diet.  Avoid foods that may constipate you or give you diarrhea.  Avoid foods with seeds, nuts, corn or popcorn.  Beginning the day after surgery, drink 6-8 glasses of water a day in addition to your meals.  Limit you caffeine intake to 1-2 servings per day.  Medication  Take a fiber supplement (Metamucil, Citrucel, FiberCon) twice a day.  Take a stool softener like Colace twice daily  Take your pain medication as directed.  Avoid products containing aspirin.  Bowel Habits  You should have a bowel movement at least every other day.  If you are constipated, you may take a Fleet enema or 2 Dulcolax tablets.  Call the office if no results occur.  You may bear down a normal amount to have a bowel movement without hurting your tissues after the operation.  Activity  Walking is encouraged.  You may drive when you are no longer on prescription pain medication.  You may go up and down stairs carefully.  No heavy lifting or strenuous activity until after your first post operative appointment  Do NOT sit on a rubber ring; instead use a soft pillow if needed.  Call the office if you have any questions or concerns.  Call IMMEDIATELY if you develop:  Fever greater than 100 F  Difficulty urinating

## 2020-10-11 NOTE — Progress Notes (Signed)
Consult Note: Gyn-Onc  Consult was requested by Dr. Landry Mellow for the evaluation of Brooke Baldwin 45 y.o. female  CC:  Chief Complaint  Patient presents with  . CIN I (cervical intraepithelial neoplasia I)  . VIN II (vulvar intraepithelial neoplasia II)    Assessment/Plan:  [name]  is a 45 y.o.  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  I am recommending CO2 laser due to the extent of her disease (circumferential). We will perform representative biopsies of all areas in the OR. None appear consistent with invasive carcinoma.  I counseled the patient regarding the laser procedure, its risks, and the anticipated benefit.  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 Landry Mellow with annual cervical Paps and HPV cotesting to monitor for development of high-grade dysplasia which would justify intervention.  Due to her end-stage renal disease and dialysis schedule she requires a procedure on a Monday Wednesday or Friday.  She does not require time off of work as she already is on leave for her end-stage renal disease.   HPI: [name] is a 45 year old woman who was seen in consultation at the request of Dr Landry Mellow for evaluation of vulvar dysplasia.  The patient has a longstanding history of HIV positivity.  She is actively treated for this and reported having an undetectable viral load.  She takes Airline pilot.  She had a history of having vulvar dysplasia treated remotely in the past in [place] with an excisional procedure for what she describes as precancer.  The patient was due for her annual gynecologic visit in January 2022 and also noted symptomatic right pruritus  on the labia.  She saw Dr. Landry Mellow on August 18, 2020 and Pap smear at that time showed L SIL positive for high-risk HPV.  She return for colposcopy and cervical biopsy on September 23, 2020 which revealed a low-grade squamous intraepithelial lesion of the cervix.  At that time a biopsy was taken from the perineum and the right labia majora which showed moderate dysplasia, VIN 2.  Her medical history is most significant for HIV positivity, see above.  She also has a history of developing end-stage renal disease in November 2021.  The patient is unclear what prompted this diagnosis however she is treated with dialysis on Tuesdays Thursdays and Saturdays.  Her surgical history is most significant for an excision of the vulva for VIN in [place].  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  Her family cancer history is unremarkable.  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 She lives with her fianc.   Current Meds:  Outpatient Encounter Medications as of 10/11/2020  Medication Sig  . bictegravir-emtricitabine-tenofovir AF (BIKTARVY) 50-200-25 MG TABS tablet Take 1 tablet by mouth daily.  . NON FORMULARY Renal vitamin at dialysis  . triamcinolone (KENALOG) 0.1 % SMARTSIG:1 Application Topical 2-3 Times Daily (Patient not taking: Reported on 10/10/2020)  . [DISCONTINUED] atovaquone (MEPRON) 750 MG/5ML suspension Take 10 mLs (1,500 mg total) by mouth daily with breakfast. (Patient not taking: No sig reported)   No facility-administered encounter medications on file as of  10/11/2020.    Allergy:  Allergies  Allergen Reactions  . Baclofen Other (See Comments)    Caused intolerable dizziness   . Gabapentin Other (See Comments)    Caused intolerable dizziness  . Penicillins Other (See Comments)    Unknown childhood reaction     Social Hx:   Social History   Socioeconomic History  .  Marital status: Single    Spouse name: Not on file  . Number of children: 0  . Years of education: college  . Highest education level: Bachelor's degree (e.g., BA, AB, BS)  Occupational History  . Occupation: Administrator, arts  Tobacco Use  . Smoking status: Never Smoker  . Smokeless tobacco: Never Used  Vaping Use  . Vaping Use: Never used  Substance and Sexual Activity  . Alcohol use: Not Currently  . Drug use: Not Currently  . Sexual activity: Not Currently    Birth control/protection: None  Other Topics Concern  . Not on file  Social History Narrative   Lives at home with significant other.   Right-handed.   No daily caffeine use.   Social Determinants of Health   Financial Resource Strain: Not on file  Food Insecurity: Not on file  Transportation Needs: Not on file  Physical Activity: Not on file  Stress: Not on file  Social Connections: Not on file  Intimate Partner Violence: Not on file    Past Surgical Hx:  Past Surgical History:  Procedure Laterality Date  . A/V FISTULAGRAM Left 07/28/2020   Procedure: A/V FISTULAGRAM;  Surgeon: Marty Heck, MD;  Location: [name] [facility];  Service: Cardiovascular;  Laterality: Left;  . ANKLE FRACTURE SURGERY Right   . AV FISTULA PLACEMENT Left 07/04/2020   Procedure: INSERTION OF LEFT ARTERIOVENOUS (AV) GORE-TEX GRAFT ARM (ACUSEAL);  Surgeon: Cherre Robins, MD;  [facility];  Service: Vascular;  Laterality: Left;  . CERVIX SURGERY     removal of HPV warts  . PERIPHERAL VASCULAR BALLOON ANGIOPLASTY Left 07/28/2020   Procedure: PERIPHERAL VASCULAR BALLOON ANGIOPLASTY;  Surgeon: Marty Heck, MD;  Location: [place] [facility];  Service: Cardiovascular;  Laterality: Left;  AVF    Past Medical Hx:  Past Medical History:  Diagnosis Date  . Chronic kidney disease    diaylsis T-Th-SAT  . HIV (human immunodeficiency virus infection) ([place])   . 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 Thrush   . Trigeminal neuralgia     Past Gynecological History:  See HPI Patient's last menstrual period was 04/02/2015 (approximate).  Family Hx:  Family History  Problem Relation Age of Onset  . Diabetes Mother   . Healthy Father   . Breast cancer Maternal Aunt   . Pancreatic cancer Maternal Grandfather   . Colon cancer Neg Hx   . Colon polyps Neg Hx   . Esophageal cancer Neg Hx   . Rectal cancer Neg Hx   . Stomach cancer Neg Hx   . Prostate cancer Neg Hx   . Endometrial cancer Neg Hx   . Ovarian cancer Neg Hx     Review of Systems:  Constitutional  Feels well,   ENT Normal appearing ears and nares bilaterally Skin/Breast  No rash, sores, jaundice, itching, dryness Cardiovascular  No chest pain, shortness of breath, or edema  Pulmonary  No cough or wheeze.  Gastro Intestinal  No nausea, vomitting, or diarrhoea. No bright red blood per rectum, no abdominal pain, change in bowel movement, or constipation.  Genito Urinary  No frequency, urgency, dysuria, no bleeding, + pruritis. Musculo Skeletal  No myalgia, arthralgia, joint swelling or pain  Neurologic  No weakness, numbness, change in gait,  Psychology  No depression, anxiety, insomnia.   Vitals:  Blood pressure (!) 133/91, pulse 100, temperature (!) 96.5 F (35.8 C), temperature source Tympanic, resp. rate 20, height 5\' 2"  (1.575 m), weight 174 lb 2.6 oz (79 kg), last menstrual period 04/02/2015, SpO2 100 %.  Physical Exam: WD in NAD Neck  Supple NROM, without any enlargements.  Lymph Node Survey No cervical supraclavicular or inguinal adenopathy Cardiovascular  Pulse normal rate, regularity and rhythm. S1 and S2 normal.  Lungs  Clear to auscultation bilateraly, without wheezes/crackles/rhonchi. Good air movement.  Skin  No rash/lesions/breakdown  Psychiatry  Alert and oriented to person, place, and time  Abdomen  Normoactive bowel sounds, abdomen soft, non-tender and mildly obese without  evidence of hernia.  Back No CVA tenderness Genito Urinary  Vulva/vagina: Acetowhite areas and leukoplakia circumferentially on the vaginal introitus and labia minora and perineal body.  There was some nodularity to the perineal body lesion but the vaginal introital lesions were smooth and flushed with the mucosa without nodularity or suspicion for malignancy. Rectal  deferred Extremities  No bilateral cyanosis, clubbing or edema.  60 minutes of total time was spent for this patient encounter, including preparation, face-to-face counseling with the patient and coordination of care, review of imaging (results and images), communication with the referring provider and documentation of the encounter.   Thereasa Solo, MD  10/11/2020, 10:36 AM

## 2020-10-11 NOTE — H&P (View-Only) (Signed)
Consult Note: Gyn-Onc  Consult was requested by Dr. Landry Mellow for the evaluation of Brooke Baldwin 45 y.o. female  CC:  Chief Complaint  Patient presents with  . CIN I (cervical intraepithelial neoplasia I)  . VIN II (vulvar intraepithelial neoplasia II)    Assessment/Plan:  [name]  is a 45 y.o.  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  I am recommending CO2 laser due to the extent of her disease (circumferential). We will perform representative biopsies of all areas in the OR. None appear consistent with invasive carcinoma.  I counseled the patient regarding the laser procedure, its risks, and the anticipated benefit.  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 Landry Mellow with annual cervical Paps and HPV cotesting to monitor for development of high-grade dysplasia which would justify intervention.  Due to her end-stage renal disease and dialysis schedule she requires a procedure on a Monday Wednesday or Friday.  She does not require time off of work as she already is on leave for her end-stage renal disease.   HPI: [name] is a 45 year old woman who was seen in consultation at the request of Dr Landry Mellow for evaluation of vulvar dysplasia.  The patient has a longstanding history of HIV positivity.  She is actively treated for this and reported having an undetectable viral load.  She takes Airline pilot.  She had a history of having vulvar dysplasia treated remotely in the past in [place] with an excisional procedure for what she describes as precancer.  The patient was due for her annual gynecologic visit in January 2022 and also noted symptomatic right pruritus  on the labia.  She saw Dr. Landry Mellow on August 18, 2020 and Pap smear at that time showed L SIL positive for high-risk HPV.  She return for colposcopy and cervical biopsy on September 23, 2020 which revealed a low-grade squamous intraepithelial lesion of the cervix.  At that time a biopsy was taken from the perineum and the right labia majora which showed moderate dysplasia, VIN 2.  Her medical history is most significant for HIV positivity, see above.  She also has a history of developing end-stage renal disease in November 2021.  The patient is unclear what prompted this diagnosis however she is treated with dialysis on Tuesdays Thursdays and Saturdays.  Her surgical history is most significant for an excision of the vulva for VIN in [place].  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  Her family cancer history is unremarkable.  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 She lives with her fianc.   Current Meds:  Outpatient Encounter Medications as of 10/11/2020  Medication Sig  . bictegravir-emtricitabine-tenofovir AF (BIKTARVY) 50-200-25 MG TABS tablet Take 1 tablet by mouth daily.  . NON FORMULARY Renal vitamin at dialysis  . triamcinolone (KENALOG) 0.1 % SMARTSIG:1 Application Topical 2-3 Times Daily (Patient not taking: Reported on 10/10/2020)  . [DISCONTINUED] atovaquone (MEPRON) 750 MG/5ML suspension Take 10 mLs (1,500 mg total) by mouth daily with breakfast. (Patient not taking: No sig reported)   No facility-administered encounter medications on file as of  10/11/2020.    Allergy:  Allergies  Allergen Reactions  . Baclofen Other (See Comments)    Caused intolerable dizziness   . Gabapentin Other (See Comments)    Caused intolerable dizziness  . Penicillins Other (See Comments)    Unknown childhood reaction     Social Hx:   Social History   Socioeconomic History  .  Marital status: Single    Spouse name: Not on file  . Number of children: 0  . Years of education: college  . Highest education level: Bachelor's degree (e.g., BA, AB, BS)  Occupational History  . Occupation: Administrator, arts  Tobacco Use  . Smoking status: Never Smoker  . Smokeless tobacco: Never Used  Vaping Use  . Vaping Use: Never used  Substance and Sexual Activity  . Alcohol use: Not Currently  . Drug use: Not Currently  . Sexual activity: Not Currently    Birth control/protection: None  Other Topics Concern  . Not on file  Social History Narrative   Lives at home with significant other.   Right-handed.   No daily caffeine use.   Social Determinants of Health   Financial Resource Strain: Not on file  Food Insecurity: Not on file  Transportation Needs: Not on file  Physical Activity: Not on file  Stress: Not on file  Social Connections: Not on file  Intimate Partner Violence: Not on file    Past Surgical Hx:  Past Surgical History:  Procedure Laterality Date  . A/V FISTULAGRAM Left 07/28/2020   Procedure: A/V FISTULAGRAM;  Surgeon: Marty Heck, MD;  Location: [facility];  Service: Cardiovascular;  Laterality: Left;  . ANKLE FRACTURE SURGERY Right   . AV FISTULA PLACEMENT Left 07/04/2020   Procedure: INSERTION OF LEFT ARTERIOVENOUS (AV) GORE-TEX GRAFT ARM (ACUSEAL);  Surgeon: Cherre Robins, MD;  Location: [facility] OR;  Service: Vascular;  Laterality: Left;  . CERVIX SURGERY     removal of HPV warts  . PERIPHERAL VASCULAR BALLOON ANGIOPLASTY Left 07/28/2020   Procedure: PERIPHERAL VASCULAR BALLOON ANGIOPLASTY;  Surgeon: Marty Heck, MD;  Location: [facility];  Service: Cardiovascular;  Laterality: Left;  AVF    Past Medical Hx:  Past Medical History:  Diagnosis Date  . Chronic kidney disease    diaylsis T-Th-SAT  . HIV (human immunodeficiency virus infection) ([place])   . 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 Thrush   . Trigeminal neuralgia     Past Gynecological History:  See HPI Patient's last menstrual period was 04/02/2015 (approximate).  Family Hx:  Family History  Problem Relation Age of Onset  . Diabetes Mother   . Healthy Father   . Breast cancer Maternal Aunt   . Pancreatic cancer Maternal Grandfather   . Colon cancer Neg Hx   . Colon polyps Neg Hx   . Esophageal cancer Neg Hx   . Rectal cancer Neg Hx   . Stomach cancer Neg Hx   . Prostate cancer Neg Hx   . Endometrial cancer Neg Hx   . Ovarian cancer Neg Hx     Review of Systems:  Constitutional  Feels well,   ENT Normal appearing ears and nares bilaterally Skin/Breast  No rash, sores, jaundice, itching, dryness Cardiovascular  No chest pain, shortness of breath, or edema  Pulmonary  No cough or wheeze.  Gastro Intestinal  No nausea, vomitting, or diarrhoea. No bright red blood per rectum, no abdominal pain, change in bowel movement, or constipation.  Genito Urinary  No frequency, urgency, dysuria, no bleeding, + pruritis. Musculo Skeletal  No myalgia, arthralgia, joint swelling or pain  Neurologic  No weakness, numbness, change in gait,  Psychology  No depression, anxiety, insomnia.   Vitals:  Blood pressure (!) 133/91, pulse 100, temperature (!) 96.5 F (35.8 C), temperature source Tympanic, resp. rate 20, height 5\' 2"  (1.575 m), weight 174 lb 2.6 oz (79 kg), last menstrual period 04/02/2015, SpO2 100 %.  Physical Exam: WD in NAD Neck  Supple NROM, without any enlargements.  Lymph Node Survey No cervical supraclavicular or inguinal adenopathy Cardiovascular  Pulse normal rate, regularity and rhythm. S1 and S2 normal.  Lungs  Clear to auscultation bilateraly, without wheezes/crackles/rhonchi. Good air movement.  Skin  No rash/lesions/breakdown  Psychiatry  Alert and oriented to person, place, and time  Abdomen  Normoactive bowel sounds, abdomen soft, non-tender and mildly obese without  evidence of hernia.  Back No CVA tenderness Genito Urinary  Vulva/vagina: Acetowhite areas and leukoplakia circumferentially on the vaginal introitus and labia minora and perineal body.  There was some nodularity to the perineal body lesion but the vaginal introital lesions were smooth and flushed with the mucosa without nodularity or suspicion for malignancy. Rectal  deferred Extremities  No bilateral cyanosis, clubbing or edema.  60 minutes of total time was spent for this patient encounter, including preparation, face-to-face counseling with the patient and coordination of care, review of imaging (results and images), communication with the referring provider and documentation of the encounter.   Thereasa Solo, MD  10/11/2020, 10:36 AM

## 2020-10-12 ENCOUNTER — Other Ambulatory Visit: Payer: Self-pay | Admitting: Gynecologic Oncology

## 2020-10-12 ENCOUNTER — Ambulatory Visit: Payer: POS | Admitting: Internal Medicine

## 2020-10-13 ENCOUNTER — Encounter (HOSPITAL_BASED_OUTPATIENT_CLINIC_OR_DEPARTMENT_OTHER): Payer: Self-pay | Admitting: Gynecologic Oncology

## 2020-10-14 ENCOUNTER — Encounter (HOSPITAL_BASED_OUTPATIENT_CLINIC_OR_DEPARTMENT_OTHER): Payer: Self-pay | Admitting: Gynecologic Oncology

## 2020-10-14 ENCOUNTER — Telehealth: Payer: Self-pay | Admitting: *Deleted

## 2020-10-14 NOTE — Telephone Encounter (Signed)
I called patient, no answer. Left a message to see if she had the echo that was scheduled on 3/4. I left a message for her to call me back with this information and notified her that we have not received any echo reports.

## 2020-10-17 ENCOUNTER — Other Ambulatory Visit: Payer: Self-pay

## 2020-10-17 ENCOUNTER — Encounter (HOSPITAL_BASED_OUTPATIENT_CLINIC_OR_DEPARTMENT_OTHER): Payer: Self-pay | Admitting: Gynecologic Oncology

## 2020-10-17 ENCOUNTER — Other Ambulatory Visit (HOSPITAL_COMMUNITY)
Admission: RE | Admit: 2020-10-17 | Discharge: 2020-10-17 | Disposition: A | Discharge status: Home / Self Care | Payer: POS | Source: Ambulatory Visit | Attending: Gastroenterology | Admitting: Gastroenterology

## 2020-10-17 DIAGNOSIS — Z20822 Contact with and (suspected) exposure to covid-19: Secondary | ICD-10-CM | POA: Insufficient documentation

## 2020-10-17 DIAGNOSIS — Z01812 Encounter for preprocedural laboratory examination: Secondary | ICD-10-CM | POA: Diagnosis not present

## 2020-10-17 LAB — SARS CORONAVIRUS 2 (TAT 6-24 HRS): SARS Coronavirus 2: NEGATIVE

## 2020-10-17 NOTE — Progress Notes (Addendum)
ADDENDUM:  Chart reviewed by Dr Viona Gilmore. Brooke Baldwin MDA, stated ok for [facility] barring any acute status change dos.   Spoke w/ via phone for pre-op interview--- PT Lab needs dos----  Istat             Lab results------ current ekg in epic/ and received fax ekg done at cardiology office 09-08-2020, tracing with chart COVID test ------done  10-17-2020 result in epic Arrive at ------- 0945 on 10-19-2020 NPO after MN NO Solid Food.  Clear liquids from MN until--- 0845 Med rec completed Medications to take morning of surgery ----- NONE Diabetic medication ----- n/a Patient instructed to bring photo id and insurance card day of surgery Patient aware to have Driver (ride ) / caregiver    for 24 hours after surgery --cousin Vikki Ports whom will be caregiver until sig other gets home , [facility] Patient Special Instructions ----- n/a Pre-Op special Istructions ----- n/a Patient verbalized understanding of instructions that were given at this phone interview. Patient denies shortness of breath, chest pain, fever, cough at this phone interview.   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  Pt denies palpitations are bothersome and does not have any other symptoms.  Chart to be reviewed by anesthesia.  PCP: Shanon Rosser PA Cardiologist : Dr Lajuana Matte (lov 09-08-2020 this was evaluation visit, care everywhere) [place] on [address]. In [place] Chest x-ray : 06-30-2020 epic EKG : 07-23-2020 epic/  09-08-2020 care everywhere (tracing w/ chart) Echo :  Per pt is scheduled for 10-28-2020 Event monitor:  Pt stated was told normal with no arrhythmia's Stress test:  no Cardiac Cath : no Activity level:  Denies sob w/ any activity Sleep Study/ CPAP :  NO Fasting Blood Sugar :      / Checks Blood Sugar -- times a day:  N/A Blood  Thinner/ Instructions /Last Dose:  NO ASA / Instructions/ Last Dose : NO

## 2020-10-17 NOTE — Telephone Encounter (Signed)
Echo was rescheduled for 3/18 so patient wants to reschedule the colonoscopy. She is also having GYN laser sx on 3/16. Rescheduled colon for 3/30 at 130. New prep instructions mailed to pt-pt is aware. Patient understands that we need the echo results, she will give them the release form.

## 2020-10-18 ENCOUNTER — Ambulatory Visit (HOSPITAL_COMMUNITY): Admission: RE | Admit: 2020-10-18 | Payer: POS | Source: Ambulatory Visit

## 2020-10-18 ENCOUNTER — Ambulatory Visit: Payer: Medicare Other | Admitting: Vascular Surgery

## 2020-10-18 ENCOUNTER — Ambulatory Visit: Payer: POS

## 2020-10-18 ENCOUNTER — Telehealth: Payer: Self-pay

## 2020-10-18 NOTE — Telephone Encounter (Signed)
Brooke Baldwin states that she understands her pr-op instruction as noted  In Brooke Sexton's note 10-18-20.

## 2020-10-19 ENCOUNTER — Telehealth: Payer: Self-pay | Admitting: *Deleted

## 2020-10-19 ENCOUNTER — Ambulatory Visit (HOSPITAL_BASED_OUTPATIENT_CLINIC_OR_DEPARTMENT_OTHER): Admission: RE | Admit: 2020-10-19 | Payer: POS | Source: Ambulatory Visit | Admitting: Gynecologic Oncology

## 2020-10-19 ENCOUNTER — Encounter: Payer: Medicare Other | Admitting: Gastroenterology

## 2020-10-19 HISTORY — DX: Personal history of vulvar dysplasia: Z87.412

## 2020-10-19 HISTORY — DX: Atrophy of kidney (terminal): N26.1

## 2020-10-19 HISTORY — DX: Moderate vulvar dysplasia: N90.1

## 2020-10-19 HISTORY — DX: Chronic kidney disease, unspecified: N18.9

## 2020-10-19 HISTORY — DX: Anemia in chronic kidney disease: D63.1

## 2020-10-19 HISTORY — DX: Other specified postprocedural states: Z98.890

## 2020-10-19 HISTORY — DX: Unspecified nephritic syndrome with focal and segmental glomerular lesions: N05.1

## 2020-10-19 HISTORY — DX: Palpitations: R00.2

## 2020-10-19 HISTORY — DX: Secondary hyperparathyroidism of renal origin: N25.81

## 2020-10-19 HISTORY — DX: End stage renal disease: N18.6

## 2020-10-19 HISTORY — DX: Tachycardia, unspecified: R00.0

## 2020-10-19 HISTORY — DX: Mild cervical dysplasia: N87.0

## 2020-10-19 SURGERY — CO2 LASER APPLICATION
Anesthesia: General

## 2020-10-19 NOTE — Telephone Encounter (Signed)
Brooke Baldwin states that she has a cold.  Afebrile Head congestion  Fever blister  Upset stomach. She has begun taking mucinex. She would like to r/s to next week.  Told her that our office will call her with available surgery dates in the next couple of days.  Notified Jocelyn Lamer in  [place] [place] patient surgery center that patient canceled surgery.

## 2020-10-19 NOTE — Telephone Encounter (Signed)
Brooke Baldwin called and left a message that she needs to cancel her surgery for today. Brooke Baldwin stated that she woke up sick. Message given to [facility] APP. Dr Denman George and desk RN

## 2020-10-21 ENCOUNTER — Telehealth: Payer: Self-pay

## 2020-10-21 NOTE — Telephone Encounter (Signed)
TC to patient to reschedule surgery with Dr. Denman George.  Patient chose 11/03/3020.  Follow up (post op)appointment made for 11/29/2020.

## 2020-10-28 ENCOUNTER — Encounter (HOSPITAL_BASED_OUTPATIENT_CLINIC_OR_DEPARTMENT_OTHER): Payer: Self-pay | Admitting: Gynecologic Oncology

## 2020-10-28 ENCOUNTER — Other Ambulatory Visit: Payer: Self-pay

## 2020-10-28 NOTE — Telephone Encounter (Signed)
Echo results from 3/18 are in Epic  EF 55-60%, no AV stenosis.

## 2020-10-28 NOTE — Progress Notes (Signed)
ADDENDUM:  Chart reviewed by Dr Viona Gilmore. [name] Heck MDA, stated ok for [facility] barring any acute status change dos.   Spoke w/ via phone for pre-op interview--- PT Lab needs dos----  Istat             Lab results------ current ekg in epic/ and lastest ekg tracing  received via fax done at cardiology office 09-08-2020, tracing with chart  COVID test ------ 11-01-2020 @ 0840 Arrive at ------- 0800 on 11-03-2020 NPO after MN NO Solid Food.  Clear liquids from MN until--- 0700 Med rec completed Medications to take morning of surgery ----- NONE Diabetic medication ----- n/a  Patient instructed to bring photo id and insurance card day of surgery Patient aware to have Driver (ride ) / caregiver    for 24 hours after surgery --cousin Vikki Ports whom will be caregiver until sig other gets home , [facility]  Patient Special Instructions ----- n/a Pre-Op special Istructions ----- n/a Patient verbalized understanding of instructions that were given at this phone interview. Patient denies shortness of breath, chest pain, fever, cough at this phone interview.   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  Pt denies palpitations are bothersome and does not have any other symptoms.  Chart to be reviewed by anesthesia.  Pt rescheduled this surgery from 10-19-2020 due to illness.  Spoke w/ pt 10-28-2020 pt stated no changes health history since last done 10-17-2020.   PCP: Shanon Rosser PA Cardiologist : Dr Lajuana Matte (lov 09-08-2020 this was evaluation visit, care everywhere)  [facility] on [place] [facility]:  Dr Vella Redhead [facility] 10-10-2020 epic) ID:  Dr A. Juleen China Tereso Newcomer 09-30-2020 epic)  Chest x-ray : 06-30-2020 epic EKG : 07-23-2020 epic/  09-08-2020 care everywhere (tracing w/ chart) Echo :  10-21-2020  Care  everywhere  Event monitor:  09-08-2020 Pt stated was told normal with no arrhythmia's Stress test:  no Cardiac Cath : no Activity level:  Denies sob w/ any activity Sleep Study/ CPAP :  NO Fasting Blood Sugar :      / Checks Blood Sugar -- times a day:  N/A Blood Thinner/ Instructions /Last Dose:  NO ASA / Instructions/ Last Dose : NO

## 2020-11-01 ENCOUNTER — Other Ambulatory Visit (HOSPITAL_COMMUNITY)
Admission: RE | Admit: 2020-11-01 | Discharge: 2020-11-01 | Disposition: A | Discharge status: Home / Self Care | Payer: POS | Source: Ambulatory Visit | Attending: Gynecologic Oncology | Admitting: Gynecologic Oncology

## 2020-11-01 ENCOUNTER — Ambulatory Visit: Payer: POS | Admitting: Internal Medicine

## 2020-11-01 ENCOUNTER — Telehealth: Payer: Self-pay

## 2020-11-01 DIAGNOSIS — Z01812 Encounter for preprocedural laboratory examination: Secondary | ICD-10-CM | POA: Insufficient documentation

## 2020-11-01 DIAGNOSIS — Z20822 Contact with and (suspected) exposure to covid-19: Secondary | ICD-10-CM | POA: Insufficient documentation

## 2020-11-01 LAB — SARS CORONAVIRUS 2 (TAT 6-24 HRS): SARS Coronavirus 2: NEGATIVE

## 2020-11-01 NOTE — Telephone Encounter (Signed)
Brooke Baldwin states that she understands the pre-op instructions given by Brooke Reather [place] on 10-28-20. Pt going to get COVID test now as she forgot appointment for 0840 today.  Her daughter had to have surgery yesterday and she forgot about appointment this morning.

## 2020-11-02 ENCOUNTER — Ambulatory Visit (HOSPITAL_BASED_OUTPATIENT_CLINIC_OR_DEPARTMENT_OTHER)
Admission: RE | Admit: 2020-11-02 | Discharge: 2020-11-02 | Disposition: A | Discharge status: Home / Self Care | Payer: POS | Attending: Gynecologic Oncology | Admitting: Gynecologic Oncology

## 2020-11-02 ENCOUNTER — Encounter: Payer: Self-pay | Admitting: Vascular Surgery

## 2020-11-02 ENCOUNTER — Encounter: Payer: Medicare Other | Admitting: Gastroenterology

## 2020-11-02 DIAGNOSIS — N901 Moderate vulvar dysplasia: Secondary | ICD-10-CM | POA: Diagnosis not present

## 2020-11-02 DIAGNOSIS — Z418 Encounter for other procedures for purposes other than remedying health state: Secondary | ICD-10-CM | POA: Insufficient documentation

## 2020-11-02 DIAGNOSIS — D071 Carcinoma in situ of vulva: Secondary | ICD-10-CM

## 2020-11-03 ENCOUNTER — Telehealth: Payer: Self-pay

## 2020-11-03 ENCOUNTER — Encounter (HOSPITAL_BASED_OUTPATIENT_CLINIC_OR_DEPARTMENT_OTHER): Payer: Self-pay | Admitting: Anesthesiology

## 2020-11-03 ENCOUNTER — Encounter (HOSPITAL_BASED_OUTPATIENT_CLINIC_OR_DEPARTMENT_OTHER)
Admission: RE | Disposition: A | Discharge status: Home / Self Care | Payer: Self-pay | Source: Home / Self Care | Attending: Gynecologic Oncology

## 2020-11-03 ENCOUNTER — Encounter (HOSPITAL_BASED_OUTPATIENT_CLINIC_OR_DEPARTMENT_OTHER): Payer: Self-pay | Admitting: Gynecologic Oncology

## 2020-11-03 DIAGNOSIS — D071 Carcinoma in situ of vulva: Secondary | ICD-10-CM

## 2020-11-03 SURGERY — CO2 LASER APPLICATION
Anesthesia: General

## 2020-11-03 SURGICAL SUPPLY — 34 items
BLADE SURG 15 STRL LF DISP TIS (BLADE) IMPLANT
BLADE SURG 15 STRL SS (BLADE)
CANISTER SUCT 1200ML W/VALVE (MISCELLANEOUS) IMPLANT
CANISTER SUCT 3000ML PPV (MISCELLANEOUS) IMPLANT
CATH ROBINSON RED A/P 16FR (CATHETERS) ×2 IMPLANT
COVER WAND RF STERILE (DRAPES) ×2 IMPLANT
DEPRESSOR TONGUE BLADE STERILE (MISCELLANEOUS) ×2 IMPLANT
DRSG TELFA 3X8 NADH (GAUZE/BANDAGES/DRESSINGS) IMPLANT
ELECT BALL LEEP 3MM BLK (ELECTRODE) IMPLANT
GLOVE SURG ENC MOIS LTX SZ6 (GLOVE) ×2 IMPLANT
GOWN STRL REUS W/TWL LRG LVL3 (GOWN DISPOSABLE) ×2 IMPLANT
KIT TURNOVER CYSTO (KITS) ×2 IMPLANT
NEEDLE HYPO 25X1 1.5 SAFETY (NEEDLE) IMPLANT
NS IRRIG 500ML POUR BTL (IV SOLUTION) IMPLANT
PACK VAGINAL WOMENS (CUSTOM PROCEDURE TRAY) ×2 IMPLANT
PAD PREP 24X48 CUFFED NSTRL (MISCELLANEOUS) ×2 IMPLANT
PUNCH BIOPSY DERMAL 3 (INSTRUMENTS) ×1 IMPLANT
PUNCH BIOPSY DERMAL 3MM (INSTRUMENTS) ×1
PUNCH BIOPSY DERMAL 4MM (INSTRUMENTS) ×2 IMPLANT
SUT VIC AB 0 CT1 36 (SUTURE) IMPLANT
SUT VIC AB 2-0 SH 27 (SUTURE)
SUT VIC AB 2-0 SH 27XBRD (SUTURE) IMPLANT
SUT VIC AB 3-0 PS2 18 (SUTURE)
SUT VIC AB 3-0 PS2 18XBRD (SUTURE) IMPLANT
SUT VIC AB 3-0 SH 27 (SUTURE)
SUT VIC AB 3-0 SH 27X BRD (SUTURE) IMPLANT
SUT VIC AB 4-0 PS2 18 (SUTURE) IMPLANT
SUT VICRYL 0 UR6 27IN ABS (SUTURE) IMPLANT
SWAB OB GYN 8IN STERILE 2PK (MISCELLANEOUS) ×4 IMPLANT
TOWEL OR 17X26 10 PK STRL BLUE (TOWEL DISPOSABLE) ×4 IMPLANT
TUBE CONNECTING 12X1/4 (SUCTIONS) IMPLANT
VACUUM HOSE 7/8X10 W/ WAND (MISCELLANEOUS) IMPLANT
VACUUM HOSE/TUBING 7/8INX6FT (MISCELLANEOUS) ×2 IMPLANT
WATER STERILE IRR 500ML POUR (IV SOLUTION) ×2 IMPLANT

## 2020-11-03 NOTE — Anesthesia Preprocedure Evaluation (Deleted)
Anesthesia Evaluation  Patient identified by MRN, date of birth, ID band Patient awake    Reviewed: Allergy & Precautions, NPO status , Patient's Chart, lab work & pertinent test results  Airway        Dental   Pulmonary neg pulmonary ROS,           Cardiovascular hypertension (no meds), +CHF (diastolic dysfunction)  + Valvular Problems/Murmurs (mild-mod TR)   Echo 10/21/20: The left ventricular size is normal.  Mild left ventricular hypertrophy  Left ventricular systolic function is normal.  LV ejection fraction = 55-60%.  Left ventricular filling pattern is prolonged relaxation.  The left ventricular wall motion is normal.  There is mild to moderate tricuspid regurgitation.  Estimated right ventricular systolic pressure is 22 mmHg.     Neuro/Psych negative neurological ROS  negative psych ROS   GI/Hepatic negative GI ROS, Neg liver ROS,   Endo/Other  Obesity BMI 31  Renal/GU ESRF and DialysisRenal diseaseAVF L forearm 07/2020, still using RIJ catheter for HD HD T/TH/Sat  Female GU complaint (VIN 2)     Musculoskeletal negative musculoskeletal ROS (+)   Abdominal   Peds negative pediatric ROS (+)  Hematology  (+) Blood dyscrasia, anemia , HIV,   Anesthesia Other Findings   Reproductive/Obstetrics negative OB ROS                             Anesthesia Physical Anesthesia Plan  ASA: III  Anesthesia Plan: General   Post-op Pain Management:    Induction: Intravenous  PONV Risk Score and Plan: 4 or greater and Ondansetron, Dexamethasone, Midazolam, Scopolamine patch - Pre-op and Treatment may vary due to age or medical condition  Airway Management Planned: LMA  Additional Equipment: None  Intra-op Plan:   Post-operative Plan: Extubation in OR  Informed Consent: I have reviewed the patients History and Physical, chart, labs and discussed the procedure including the risks,  benefits and alternatives for the proposed anesthesia with the patient or authorized representative who has indicated his/her understanding and acceptance.     Dental advisory given  Plan Discussed with: CRNA  Anesthesia Plan Comments:         Anesthesia Quick Evaluation

## 2020-11-03 NOTE — Telephone Encounter (Signed)
LM for Brooke Baldwin stating that April 12 or 14, 2022 are the options to r/s her surgery.  She needs to call the office tomorrow at [phone] to let Dr. Serita Grit office know which date will work for her.  This is the last time that the surgery will be scheduled.

## 2020-11-03 NOTE — Interval H&P Note (Signed)
History and Physical Interval Note:  11/03/2020 7:20 AM  Brooke Baldwin  has presented today for surgery, with the diagnosis of VULVAR INTRAEPITHELIAL NEOPLASIA II.  The various methods of treatment have been discussed with the patient and family. After consideration of risks, benefits and other options for treatment, the patient has consented to  Procedure(s): CO2 LASER APPLICATION (N/A) as a surgical intervention.  The patient's history has been reviewed, patient examined, no change in status, stable for surgery.  I have reviewed the patient's chart and labs.  Questions were answered to the patient's satisfaction.     Thereasa Solo

## 2020-11-04 NOTE — Telephone Encounter (Signed)
Brooke Baldwin stated that she would like to r/s surgery to May 19,2021 as she will be able to have her mother in town to help her. Told her that this is the last time that the surgery would be r/s.  Pt verbalized understanding.

## 2020-11-14 ENCOUNTER — Ambulatory Visit: Payer: POS | Admitting: Gynecologic Oncology

## 2020-11-29 ENCOUNTER — Ambulatory Visit: Payer: POS | Admitting: Gynecologic Oncology

## 2020-12-19 ENCOUNTER — Encounter (HOSPITAL_BASED_OUTPATIENT_CLINIC_OR_DEPARTMENT_OTHER): Payer: Self-pay | Admitting: Gynecologic Oncology

## 2020-12-19 ENCOUNTER — Other Ambulatory Visit: Payer: Self-pay

## 2020-12-19 NOTE — Progress Notes (Signed)
ADDENDUM:  Chart reviewed by Dr Viona Gilmore. [name] Heck MDA, stated ok for [facility] barring any acute status change dos.   Spoke w/ via phone for pre-op interview--- PT Lab needs dos----  CBC, BMP           Lab results------ current ekg in epic/ and received fax ekg done at cardiology office 09-08-2020, tracing with chart COVID test --- pt denies symptoms, no test per new [facility] policy dated [id number] Arrive at ------- 0945 on 10-19-2020 NPO after MN NO Solid Food.  Clear liquids from MN until--- 0700 Med rec completed Medications to take morning of surgery ----- NONE Diabetic medication ----- n/a Patient instructed to bring photo id and insurance card day of surgery Patient aware to have Driver (ride ) / caregiver    for 24 hours after surgery -- mother, Josie Patient Special Instructions ----- n/a Pre-Op special Istructions ----- n/a Patient verbalized understanding of instructions that were given at this phone interview. Patient denies shortness of breath, chest pain, fever, cough at this phone interview.   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  Pt denies palpitations are bothersome and does not have any other symptoms. Spoke w/ pt via phone today 12-19-2020 surgery rescheduled from 11-01-2020.  Pt stated no changes in medical/ medication list since 10-21-2020.  PCP: Shanon Rosser PA Cardiologist : Dr Lajuana Matte (lov 09-08-2020 this was evaluation visit, care everywhere) [place] on [address]. In [place] Chest x-ray : 06-30-2020 epic EKG : 07-23-2020 epic/  09-08-2020 care everywhere (tracing w/ chart) Echo :  Per pt is scheduled for 10-28-2020 Event monitor:  Pt stated was told normal with no arrhythmia's Stress test:  no Cardiac Cath : no Activity level:  Denies sob w/ any activity Sleep Study/ CPAP :   NO Fasting Blood Sugar :      / Checks Blood Sugar -- times a day:  N/A Blood Thinner/ Instructions /Last Dose:  NO ASA / Instructions/ Last Dose : NO

## 2020-12-21 ENCOUNTER — Telehealth: Payer: Self-pay | Admitting: Oncology

## 2020-12-21 ENCOUNTER — Other Ambulatory Visit: Payer: Self-pay

## 2020-12-21 ENCOUNTER — Other Ambulatory Visit: Payer: Medicare Other

## 2020-12-21 DIAGNOSIS — Z298 Encounter for other specified prophylactic measures: Secondary | ICD-10-CM

## 2020-12-21 DIAGNOSIS — R002 Palpitations: Secondary | ICD-10-CM

## 2020-12-21 DIAGNOSIS — B20 Human immunodeficiency virus [HIV] disease: Secondary | ICD-10-CM

## 2020-12-21 NOTE — Telephone Encounter (Signed)
Called Raymond and she does not have any questions about tomorrow's surgery.  She knows to arrive at 8:00 to the [place].

## 2020-12-21 NOTE — Progress Notes (Signed)
Patient knew to come in at 0800 instead of 0945

## 2020-12-22 ENCOUNTER — Ambulatory Visit (HOSPITAL_BASED_OUTPATIENT_CLINIC_OR_DEPARTMENT_OTHER)
Admission: RE | Admit: 2020-12-22 | Discharge: 2020-12-22 | Disposition: A | Discharge status: Home / Self Care | Payer: POS | Attending: Gynecologic Oncology | Admitting: Gynecologic Oncology

## 2020-12-22 ENCOUNTER — Encounter (HOSPITAL_BASED_OUTPATIENT_CLINIC_OR_DEPARTMENT_OTHER)
Admission: RE | Disposition: A | Discharge status: Home / Self Care | Payer: Self-pay | Source: Home / Self Care | Attending: Gynecologic Oncology

## 2020-12-22 ENCOUNTER — Ambulatory Visit (HOSPITAL_BASED_OUTPATIENT_CLINIC_OR_DEPARTMENT_OTHER): Payer: POS | Admitting: Anesthesiology

## 2020-12-22 ENCOUNTER — Other Ambulatory Visit: Payer: Self-pay

## 2020-12-22 ENCOUNTER — Encounter (HOSPITAL_BASED_OUTPATIENT_CLINIC_OR_DEPARTMENT_OTHER): Payer: Self-pay | Admitting: Gynecologic Oncology

## 2020-12-22 DIAGNOSIS — Z880 Allergy status to penicillin: Secondary | ICD-10-CM | POA: Diagnosis not present

## 2020-12-22 DIAGNOSIS — N186 End stage renal disease: Secondary | ICD-10-CM | POA: Insufficient documentation

## 2020-12-22 DIAGNOSIS — Z21 Asymptomatic human immunodeficiency virus [HIV] infection status: Secondary | ICD-10-CM | POA: Diagnosis not present

## 2020-12-22 DIAGNOSIS — Z803 Family history of malignant neoplasm of breast: Secondary | ICD-10-CM | POA: Insufficient documentation

## 2020-12-22 DIAGNOSIS — Z888 Allergy status to other drugs, medicaments and biological substances status: Secondary | ICD-10-CM | POA: Insufficient documentation

## 2020-12-22 DIAGNOSIS — Z992 Dependence on renal dialysis: Secondary | ICD-10-CM | POA: Insufficient documentation

## 2020-12-22 DIAGNOSIS — Z800 Family history of malignant neoplasm of digestive organs: Secondary | ICD-10-CM | POA: Insufficient documentation

## 2020-12-22 DIAGNOSIS — I120 Hypertensive chronic kidney disease with stage 5 chronic kidney disease or end stage renal disease: Secondary | ICD-10-CM | POA: Diagnosis not present

## 2020-12-22 DIAGNOSIS — Z833 Family history of diabetes mellitus: Secondary | ICD-10-CM | POA: Diagnosis not present

## 2020-12-22 DIAGNOSIS — N901 Moderate vulvar dysplasia: Secondary | ICD-10-CM | POA: Diagnosis not present

## 2020-12-22 DIAGNOSIS — D071 Carcinoma in situ of vulva: Secondary | ICD-10-CM | POA: Diagnosis present

## 2020-12-22 HISTORY — PX: CO2 LASER APPLICATION: SHX5778

## 2020-12-22 LAB — BASIC METABOLIC PANEL
Anion gap: 11 (ref 5–15)
BUN: 96 mg/dL — ABNORMAL HIGH (ref 6–20)
CO2: 17 mmol/L — ABNORMAL LOW (ref 22–32)
Calcium: 9.6 mg/dL (ref 8.9–10.3)
Chloride: 109 mmol/L (ref 98–111)
Creatinine, Ser: 10.6 mg/dL — ABNORMAL HIGH (ref 0.44–1.00)
GFR, Estimated: 4 mL/min — ABNORMAL LOW (ref >60)
Glucose, Bld: 82 mg/dL (ref 70–99)
Potassium: 4.8 mmol/L (ref 3.5–5.1)
Sodium: 137 mmol/L (ref 135–145)

## 2020-12-22 LAB — CBC
HCT: 35.3 % — ABNORMAL LOW (ref 36.0–46.0)
Hemoglobin: 11.1 g/dL — ABNORMAL LOW (ref 12.0–15.0)
MCH: 28.6 pg (ref 26.0–34.0)
MCHC: 31.4 g/dL (ref 30.0–36.0)
MCV: 91 fL (ref 80.0–100.0)
Platelets: 294 10*3/uL (ref 150–400)
RBC: 3.88 MIL/uL (ref 3.87–5.11)
RDW: 18.5 % — ABNORMAL HIGH (ref 11.5–15.5)
WBC: 3.5 10*3/uL — ABNORMAL LOW (ref 4.0–10.5)
nRBC: 0 % (ref 0.0–0.2)

## 2020-12-22 LAB — T-HELPER CELL (CD4) - (RCID CLINIC ONLY)
CD4 % Helper T Cell: 4 % — ABNORMAL LOW (ref 33–65)
CD4 T Cell Abs: 42 /uL — ABNORMAL LOW (ref 400–1790)

## 2020-12-22 LAB — POCT PREGNANCY, URINE: Preg Test, Ur: NEGATIVE

## 2020-12-22 SURGERY — CO2 LASER APPLICATION
Anesthesia: General | Site: Vulva

## 2020-12-22 MED ORDER — PROPOFOL 10 MG/ML IV BOLUS
INTRAVENOUS | Status: DC | PRN
  Administered 2020-12-22: 160 mg via INTRAVENOUS

## 2020-12-22 MED ORDER — PROPOFOL 10 MG/ML IV BOLUS
INTRAVENOUS | Status: AC
  Filled 2020-12-22: qty 20

## 2020-12-22 MED ORDER — SODIUM CHLORIDE 0.9 % IV SOLN: INTRAVENOUS | Status: DC

## 2020-12-22 MED ORDER — FENTANYL CITRATE (PF) 100 MCG/2ML IJ SOLN
INTRAMUSCULAR | Status: AC
  Filled 2020-12-22: qty 2

## 2020-12-22 MED ORDER — ONDANSETRON HCL 4 MG/2ML IJ SOLN
INTRAMUSCULAR | Status: AC
  Filled 2020-12-22: qty 2

## 2020-12-22 MED ORDER — SILVER SULFADIAZINE 1 % EX CREA
TOPICAL_CREAM | CUTANEOUS | Status: DC | PRN
  Administered 2020-12-22: 1 via TOPICAL

## 2020-12-22 MED ORDER — SCOPOLAMINE 1 MG/3DAYS TD PT72
MEDICATED_PATCH | TRANSDERMAL | Status: AC
  Filled 2020-12-22: qty 1

## 2020-12-22 MED ORDER — FENTANYL CITRATE (PF) 100 MCG/2ML IJ SOLN
INTRAMUSCULAR | Status: DC | PRN
  Administered 2020-12-22: 25 ug via INTRAVENOUS

## 2020-12-22 MED ORDER — LIDOCAINE 2% (20 MG/ML) 5 ML SYRINGE
INTRAMUSCULAR | Status: DC | PRN
  Administered 2020-12-22: 60 mg via INTRAVENOUS

## 2020-12-22 MED ORDER — OXYCODONE HCL 5 MG PO TABS: ORAL_TABLET | ORAL | 0 refills | Status: AC

## 2020-12-22 MED ORDER — MIDAZOLAM HCL 2 MG/2ML IJ SOLN
INTRAMUSCULAR | Status: AC
  Filled 2020-12-22: qty 2

## 2020-12-22 MED ORDER — KETOROLAC TROMETHAMINE 30 MG/ML IJ SOLN: 30.0000 mg | Freq: Once | INTRAMUSCULAR | Status: DC

## 2020-12-22 MED ORDER — BUPIVACAINE LIPOSOME 1.3 % IJ SUSP
INTRAMUSCULAR | Status: DC | PRN
  Administered 2020-12-22: 20 mL

## 2020-12-22 MED ORDER — OXYCODONE HCL 5 MG PO TABS: 5.0000 mg | ORAL_TABLET | Freq: Once | ORAL | Status: DC | PRN

## 2020-12-22 MED ORDER — SCOPOLAMINE 1 MG/3DAYS TD PT72
1.0000 | MEDICATED_PATCH | TRANSDERMAL | Status: DC
  Administered 2020-12-22: 1.5 mg via TRANSDERMAL

## 2020-12-22 MED ORDER — BUPIVACAINE HCL 0.25 % IJ SOLN
INTRAMUSCULAR | Status: DC | PRN
  Administered 2020-12-22: 10 mL

## 2020-12-22 MED ORDER — LIDOCAINE 2% (20 MG/ML) 5 ML SYRINGE
INTRAMUSCULAR | Status: AC
  Filled 2020-12-22: qty 5

## 2020-12-22 MED ORDER — OXYCODONE HCL 5 MG/5ML PO SOLN: 5.0000 mg | Freq: Once | ORAL | Status: DC | PRN

## 2020-12-22 MED ORDER — DEXAMETHASONE SODIUM PHOSPHATE 10 MG/ML IJ SOLN
INTRAMUSCULAR | Status: AC
  Filled 2020-12-22: qty 1

## 2020-12-22 MED ORDER — FENTANYL CITRATE (PF) 100 MCG/2ML IJ SOLN: 25.0000 ug | INTRAMUSCULAR | Status: DC | PRN

## 2020-12-22 MED ORDER — ONDANSETRON HCL 4 MG/2ML IJ SOLN
INTRAMUSCULAR | Status: DC | PRN
  Administered 2020-12-22: 4 mg via INTRAVENOUS

## 2020-12-22 MED ORDER — ONDANSETRON HCL 4 MG/2ML IJ SOLN: 4.0000 mg | Freq: Once | INTRAMUSCULAR | Status: DC | PRN

## 2020-12-22 SURGICAL SUPPLY — 34 items
BLADE SURG 15 STRL LF DISP TIS (BLADE) IMPLANT
BLADE SURG 15 STRL SS (BLADE)
CANISTER SUCT 1200ML W/VALVE (MISCELLANEOUS) IMPLANT
CATH ROBINSON RED A/P 16FR (CATHETERS) ×2 IMPLANT
COVER WAND RF STERILE (DRAPES) ×2 IMPLANT
DEPRESSOR TONGUE BLADE STERILE (MISCELLANEOUS) ×2 IMPLANT
DRSG TELFA 3X8 NADH (GAUZE/BANDAGES/DRESSINGS) IMPLANT
ELECT BALL LEEP 3MM BLK (ELECTRODE) IMPLANT
GLOVE SURG ENC MOIS LTX SZ6 (GLOVE) ×4 IMPLANT
GOWN STRL REUS W/TWL LRG LVL3 (GOWN DISPOSABLE) ×2 IMPLANT
KIT TURNOVER CYSTO (KITS) ×2 IMPLANT
NEEDLE HYPO 22GX1.5 SAFETY (NEEDLE) ×2 IMPLANT
NEEDLE HYPO 25X1 1.5 SAFETY (NEEDLE) IMPLANT
NS IRRIG 500ML POUR BTL (IV SOLUTION) IMPLANT
PACK VAGINAL WOMENS (CUSTOM PROCEDURE TRAY) ×2 IMPLANT
PAD PREP 24X48 CUFFED NSTRL (MISCELLANEOUS) ×2 IMPLANT
PUNCH BIOPSY DERMAL 3 (INSTRUMENTS) ×1 IMPLANT
PUNCH BIOPSY DERMAL 3MM (INSTRUMENTS) ×1
PUNCH BIOPSY DERMAL 4MM (INSTRUMENTS) ×2 IMPLANT
SUT VIC AB 0 CT1 36 (SUTURE) IMPLANT
SUT VIC AB 2-0 SH 27 (SUTURE)
SUT VIC AB 2-0 SH 27XBRD (SUTURE) IMPLANT
SUT VIC AB 3-0 PS2 18 (SUTURE)
SUT VIC AB 3-0 PS2 18XBRD (SUTURE) IMPLANT
SUT VIC AB 3-0 SH 27 (SUTURE)
SUT VIC AB 3-0 SH 27X BRD (SUTURE) IMPLANT
SUT VIC AB 4-0 PS2 18 (SUTURE) IMPLANT
SUT VICRYL 0 UR6 27IN ABS (SUTURE) IMPLANT
SWAB OB GYN 8IN STERILE 2PK (MISCELLANEOUS) ×4 IMPLANT
TOWEL OR 17X26 10 PK STRL BLUE (TOWEL DISPOSABLE) ×4 IMPLANT
TUBE CONNECTING 12X1/4 (SUCTIONS) IMPLANT
VACUUM HOSE 7/8X10 W/ WAND (MISCELLANEOUS) IMPLANT
VACUUM HOSE/TUBING 7/8INX6FT (MISCELLANEOUS) ×2 IMPLANT
WATER STERILE IRR 500ML POUR (IV SOLUTION) ×2 IMPLANT

## 2020-12-22 NOTE — H&P (Signed)
H&P Note: Gyn-Onc  Consult was initially requested by Dr. Landry Mellow for the evaluation of Brooke Baldwin 45 y.o. female  CC:  VIN 3  Assessment/Plan:  [name]  is a 45 y.o.  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  I am recommending CO2 laser due to the extent of her disease (circumferential). We will perform representative biopsies of all areas in the OR. None appear consistent with invasive carcinoma.  I counseled the patient regarding the laser procedure, its risks, and the anticipated benefit.  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 Landry Mellow with annual cervical Paps and HPV cotesting to monitor for development of high-grade dysplasia which would justify intervention.  Due to her end-stage renal disease and dialysis schedule she requires a procedure on a Monday Wednesday or Friday.  She does not require time off of work as she already is on leave for her end-stage renal disease.   HPI: [name] is a 45 year old woman who was seen in consultation at the request of Dr Landry Mellow for evaluation of vulvar dysplasia.  The patient has a longstanding history of HIV positivity.  She is actively treated for this and reported having an undetectable viral load.  She takes Airline pilot.  She had a history of having vulvar dysplasia treated remotely in the past in [place] with an excisional procedure for what she describes as precancer.  The patient was due for her annual gynecologic visit in January 2022 and also noted symptomatic right pruritus on the labia.  She saw Dr. Landry Mellow on August 18, 2020 and Pap smear at that time showed L SIL positive for high-risk HPV.  She  return for colposcopy and cervical biopsy on September 23, 2020 which revealed a low-grade squamous intraepithelial lesion of the cervix.  At that time a biopsy was taken from the perineum and the right labia majora which showed moderate dysplasia, VIN 2.  Her medical history is most significant for HIV positivity, see above.  She also has a history of developing end-stage renal disease in November 2021.  The patient is unclear what prompted this diagnosis however she is treated with dialysis on Tuesdays Thursdays and Saturdays.  Her surgical history is most significant for an excision of the vulva for VIN in [place].  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  Her family cancer history is unremarkable.  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 She lives with her fianc.   Her procedure was delayed multiple times due to her inability to make scheduled surgery appointments.   Current Meds:  Outpatient Encounter Medications as of 10/11/2020  Medication Sig  . bictegravir-emtricitabine-tenofovir AF (BIKTARVY) 50-200-25 MG TABS tablet Take 1 tablet by mouth daily.  . NON FORMULARY Renal vitamin at dialysis  . triamcinolone (KENALOG) 0.1 % SMARTSIG:1 Application Topical 2-3 Times Daily (Patient not taking: Reported on 10/10/2020)  . [DISCONTINUED] atovaquone (MEPRON) 750 MG/5ML suspension Take 10 mLs (1,500 mg total) by mouth daily with breakfast. (Patient not taking: No sig reported)   No facility-administered encounter medications on file as of 10/11/2020.  Allergy:  Allergies  Allergen Reactions  . Baclofen Other (See Comments)    Caused intolerable dizziness   . Gabapentin Other (See Comments)    Caused intolerable dizziness  . Penicillins Other (See Comments)    Unknown childhood reaction     Social Hx:   Social History   Socioeconomic History  . Marital status: Single     Spouse name: Not on file  . Number of children: 0  . Years of education: college  . Highest education level: Bachelor's degree (e.g., BA, AB, BS)  Occupational History  . Occupation: Administrator, arts  Tobacco Use  . Smoking status: Never Smoker  . Smokeless tobacco: Never Used  Vaping Use  . Vaping Use: Never used  Substance and Sexual Activity  . Alcohol use: Not Currently  . Drug use: Not Currently  . Sexual activity: Not on file  Other Topics Concern  . Not on file  Social History Narrative   Lives at home with significant other.   Right-handed.   No daily caffeine use.   Social Determinants of Health   Financial Resource Strain: Not on file  Food Insecurity: Not on file  Transportation Needs: Not on file  Physical Activity: Not on file  Stress: Not on file  Social Connections: Not on file  Intimate Partner Violence: Not on file    Past Surgical Hx:  Past Surgical History:  Procedure Laterality Date  . A/V FISTULAGRAM Left 07/28/2020   Procedure: A/V FISTULAGRAM;  Surgeon: Marty Heck, MD;  Location: [facility];  Service: Cardiovascular;  Laterality: Left;  . AV FISTULA PLACEMENT Left 07/04/2020   Procedure: INSERTION OF LEFT ARTERIOVENOUS (AV) GORE-TEX GRAFT ARM (ACUSEAL);  Surgeon: Cherre Robins, MD;  Location: [facility];  Service: Vascular;  Laterality: Left;  . CERVICAL BIOPSY  W/ LOOP ELECTRODE EXCISION  2005  . ORIF ANKLE FRACTURE Right 2012   per pt retained hardware  . PERIPHERAL VASCULAR BALLOON ANGIOPLASTY Left 07/28/2020   Procedure: PERIPHERAL VASCULAR BALLOON ANGIOPLASTY;  Surgeon: Marty Heck, MD;  Location: [facility];  Service: Cardiovascular;  Laterality: Left;  AVF  . VULVA SURGERY  2013   excision    Past Medical Hx:  Past Medical History:  Diagnosis Date  . Anemia associated with chronic renal failure   . Atrophic kidney    bilateral  . CIN I (cervical intraepithelial neoplasia I)   . ESRD (end stage renal  disease) on dialysis [facility]) 05/2020   hemodialysis  T/ Th/ Sat  @ [facility] ([address] in [place], ( 10-17-2020  s/p left AVG 11/ 2021 unable to use as of yet, currently using right IJ tunneled cath (right side of upper chest) for dialysis  . FSGS (focal segmental glomerulosclerosis)    renal biopsy 04-15-2020 in epic , secondary to HTN/ HIV/ Obese  . Heart palpitations 11/ 2021  (10-17-2020  pt stated the palpitations does not bother her and has no other symptoms , stated they started 11/ 2021)   pt was been seen by cardiology, dr s. Patria Mane ([facility] in [place]),  per pt had event monitor for 17 days , was told normal with  no arrhythmias,  and stated echo is scheduled for 10-28-2020  . History of vulvar dysplasia   . HIV (human immunodeficiency virus infection) ([place])    followed by ID-- dr a. wallace  . Hypertension   . S/P arteriovenous (AV) fistula creation 07-04-2020 @[facility]    left forearm,  07-28-2020  angioplasty of thrombosis  . Secondary hyperparathyroidism of renal origin ([place])   . Tachycardia   . Trigeminal neuralgia    ride side ---  neurologist--- dr Krista Blue   (10-17-2020  pt stated last episode about 4 months ago was mild)  . VIN II (vulvar intraepithelial neoplasia II)     Past Gynecological History:  See HPI Patient's last menstrual period was 04/02/2015 (approximate).  Family Hx:  Family History  Problem Relation Age of Onset  . Diabetes Mother   . Healthy Father   . Breast cancer Maternal Aunt   . Pancreatic cancer Maternal Grandfather   . Colon cancer Neg Hx   . Colon polyps Neg Hx   . Esophageal cancer Neg Hx   . Rectal cancer Neg Hx   . Stomach cancer Neg Hx   . Prostate cancer Neg Hx   . Endometrial cancer Neg Hx   . Ovarian cancer Neg Hx     Review of Systems:  Constitutional  Feels well,   ENT Normal appearing ears and nares bilaterally Skin/Breast  No rash, sores, jaundice, itching, dryness Cardiovascular  No chest pain, shortness of breath, or edema   Pulmonary  No cough or wheeze.  Gastro Intestinal  No nausea, vomitting, or diarrhoea. No bright red blood per rectum, no abdominal pain, change in bowel movement, or constipation.  Genito Urinary  No frequency, urgency, dysuria, no bleeding, + pruritis. Musculo Skeletal  No myalgia, arthralgia, joint swelling or pain  Neurologic  No weakness, numbness, change in gait,  Psychology  No depression, anxiety, insomnia.   Vitals:  Blood pressure (!) 152/108, pulse 92, temperature 97.9 F (36.6 C), temperature source Oral, resp. rate 17, height 5' (1.524 m), weight 177 lb 9.6 oz (80.6 kg), last menstrual period 04/02/2015, SpO2 100 %.  Physical Exam: WD in NAD Neck  Supple NROM, without any enlargements.  Lymph Node Survey No cervical supraclavicular or inguinal adenopathy Cardiovascular  Pulse normal rate, regularity and rhythm. S1 and S2 normal.  Lungs  Clear to auscultation bilateraly, without wheezes/crackles/rhonchi. Good air movement.  Skin  No rash/lesions/breakdown  Psychiatry  Alert and oriented to person, place, and time  Abdomen  Normoactive bowel sounds, abdomen soft, non-tender and mildly obese without evidence of hernia.  Back No CVA tenderness Genito Urinary  Vulva/vagina: Acetowhite areas and leukoplakia circumferentially on the vaginal introitus and labia minora and perineal body.  There was some nodularity to the perineal body lesion but the vaginal introital lesions were smooth and flushed with the mucosa without nodularity or suspicion for malignancy. Rectal  deferred Extremities  No bilateral cyanosis, clubbing or edema.   Thereasa Solo, MD  12/22/2020, 9:26 AM

## 2020-12-22 NOTE — Discharge Instructions (Signed)
Vulvar Laser After Care The vulva is the external female genitalia, outside and around the vagina and pubic bone. It consists of:  The skin on, and in front of, the pubic bone.  The clitoris.  The labia majora (large lips) on the outside of the vagina.  The labia minora (small lips) around the opening of the vagina.  The opening and the skin in and around the vagina.   ACTIVITY  Rest as much as possible the first two days after discharge.  No restrictions on heavy lifting   Do not drive a car for 24 hours  Increase activity gradually.  You may exercise after your laser site has healed. It was contribute to delayed healing if you apply too much friction to the area too quickly.  NUTRITION  You may resume your normal diet.  Drink 6 to 8 glasses of fluids a day.  Eat a healthy, balanced diet including portions of food from the meat (protein), milk, fruit, vegetable, and bread groups.  Your caregiver may recommend you take a multivitamin with iron.  ELIMINATION  You may notice that it burns when you urinate. To minimize this spray water onto your vulva as the urine passes out to dilute the urine. We will provide you with a spray bottle. A regular bottle of tap water can also be used.  Pat the area dry with toilet tissue or towel after voiding urine or stool. Do not wipe.  Re-apply burn cream in a thick layer (or neosporin or diaper cream) after voiding, rinsing and drying.  A hair dryer on the cool setting is also comforting to dry or soothe the area.  If constipation occurs, drink more liquids, and add more fruits, vegetables, and bran to your diet. You may take a mild laxative, such as Milk of Magnesia, Metamucil, or a stool softener such as Colace, with permission from your caregiver. HYGIENE  You may shower and wash your hair.  Avoid tub baths for 4 weeks.  Do not add any bath oils or chemicals to your bath water, after you have permission to take baths.  While  passing urine, pour water from a bottle or spray over your vulva to dilute the urine as it passes the incision (this will decrease burning and discomfort).  Clean yourself well after moving your bowels.  After urinating, do not wipe. Dap or pat dry with toilet paper or a dry cleath soft cloth.  A sitz bath will help keep your perineal area clean, reduce swelling, and provide comfort.  Avoid wearing underpants for the first 2 weeks and wear loose skirts to allow circulation of air around the laser site  Apply silvadine or neosporin or desitin to the wound as it heals.  HOME CARE INSTRUCTIONS   Apply a soft ice pack (or frozen bag of peas) to your perineum (vulva) every hour in the first 48 hours after surgery. This will reduce swelling.  Avoid activities that involve a lot of friction between your legs.  Avoid wearing pants or underpants in the 1st 2 weeks (skirts are preferable).  Take your temperature twice a day and record it, especially if you feel feverish or have chills.  Follow your caregiver's instructions about medicines, activity, and follow-up appointments after surgery.  Do not drink alcohol while taking pain medicine.  You may take over-the-counter medicine for pain, recommended by your caregiver.  If your pain is not relieved with medicine, call your caregiver.  Do not douche or use tampons (use a  nonperfumed sanitary pad).  Do not have sexual intercourse until your caregiver gives you permission (typically 6 weeks postoperatively). Hugging, kissing, and playful sexual activity is fine with your caregiver's permission.  Warm sitz baths, with your caregiver's permission, are helpful to control swelling and discomfort.  Take showers instead of baths, until your caregiver gives you permission to take baths.  You may take a mild medicine for constipation, recommended by your caregiver. Bran foods and drinking a lot of fluids will help with constipation.  Make sure  your family understands everything about your operation and recovery. SEEK MEDICAL CARE IF:   You notice swelling and redness around the wound area.  You notice a foul smell coming from the wound or on the surgical dressing.  You notice the wound is separating.  You have painful or bloody urination.  You develop nausea and vomiting.  You develop diarrhea.  You develop a rash.  You have a reaction or allergy from the medicine.  You feel dizzy or light-headed.  You need stronger pain medicine. SEEK IMMEDIATE MEDICAL CARE IF:   You develop a temperature of 102 F (38.9 C) or higher.  You pass out.  You develop leg or chest pain.  You develop abdominal pain.  You develop shortness of breath.  You develop bleeding from the wound area.  You see pus in the wound area. MAKE SURE YOU:   Understand these instructions.  Will watch your condition.  Will get help right away if you are not doing well or get worse.  Contact Dr Denman George at # [phone]. After hours this line will connect to the after-hours-nurse line which will contact the doctor on call.  Document Released: 03/06/2004 Document Revised: 12/07/2013 Document Reviewed: 06/24/2009 [facility] Patient Information 2015 [place], [place]. This information is not intended to replace advice given to you by your health care provider. Make sure you discuss any questions you have with your health care provider.   Post Anesthesia Home Care Instructions  Activity: Get plenty of rest for the remainder of the day. A responsible individual must stay with you for 24 hours following the procedure.  For the next 24 hours, DO NOT: -Drive a car -Paediatric nurse -Drink alcoholic beverages -Take any medication unless instructed by your physician -Make any legal decisions or sign important papers.  Meals: Start with liquid foods such as gelatin or soup. Progress to regular foods as tolerated. Avoid greasy, spicy, heavy foods. If  nausea and/or vomiting occur, drink only clear liquids until the nausea and/or vomiting subsides. Call your physician if vomiting continues.  Special Instructions/Symptoms: Your throat may feel dry or sore from the anesthesia or the breathing tube placed in your throat during surgery. If this causes discomfort, gargle with warm salt water. The discomfort should disappear within 24 hours.  If you had a scopolamine patch placed behind your ear for the management of post- operative nausea and/or vomiting:  1. The medication in the patch is effective for 72 hours, after which it should be removed.  Wrap patch in a tissue and discard in the trash. Wash hands thoroughly with soap and water. 2. You may remove the patch earlier than 72 hours if you experience unpleasant side effects which may include dry mouth, dizziness or visual disturbances. 3. Avoid touching the patch. Wash your hands with soap and water after contact with the patch.    Remove patch behind left ear by Sunday, Dec 25, 2020.

## 2020-12-22 NOTE — Anesthesia Preprocedure Evaluation (Addendum)
Anesthesia Evaluation  Patient identified by MRN, date of birth, ID band Patient awake    Reviewed: Allergy & Precautions, NPO status , Patient's Chart, lab work & pertinent test results  History of Anesthesia Complications Negative for: history of anesthetic complications  Airway Mallampati: II  TM Distance: >3 FB Neck ROM: Full    Dental  (+) Dental Advisory Given   Pulmonary neg pulmonary ROS,    Pulmonary exam normal        Cardiovascular hypertension, Normal cardiovascular exam     Neuro/Psych negative neurological ROS     GI/Hepatic negative GI ROS, Neg liver ROS,   Endo/Other  Secondary hyperparathyroidism  Renal/GU ESRF and DialysisRenal disease   vulvar intraepithelial neoplasia II    Musculoskeletal negative musculoskeletal ROS (+)   Abdominal   Peds  Hematology  (+) anemia , HIV (treated- Biktarvy),   Anesthesia Other Findings   Reproductive/Obstetrics                           Anesthesia Physical Anesthesia Plan  ASA: III  Anesthesia Plan: General   Post-op Pain Management:    Induction: Intravenous  PONV Risk Score and Plan: 3 and Ondansetron, Dexamethasone, Midazolam and Treatment may vary due to age or medical condition  Airway Management Planned: LMA  Additional Equipment: None  Intra-op Plan:   Post-operative Plan: Extubation in OR  Informed Consent: I have reviewed the patients History and Physical, chart, labs and discussed the procedure including the risks, benefits and alternatives for the proposed anesthesia with the patient or authorized representative who has indicated his/her understanding and acceptance.     Dental advisory given  Plan Discussed with:   Anesthesia Plan Comments:         Anesthesia Quick Evaluation

## 2020-12-22 NOTE — Anesthesia Postprocedure Evaluation (Signed)
Anesthesia Post Note  Patient: Brooke Baldwin  Procedure(s) Performed: CO2 LASER APPLICATION (N/A Vulva)     Patient location during evaluation: PACU Anesthesia Type: General Level of consciousness: awake and alert Pain management: pain level controlled Vital Signs Assessment: post-procedure vital signs reviewed and stable Respiratory status: spontaneous breathing, nonlabored ventilation and respiratory function stable Cardiovascular status: blood pressure returned to baseline and stable Postop Assessment: no apparent nausea or vomiting Anesthetic complications: no   No complications documented.  Last Vitals:  Vitals:   12/22/20 1200 12/22/20 1225  BP: (!) 138/95 135/90  Pulse: 84 82  Resp: 13 14  Temp: (!) 36.2 C   SpO2: 98% 100%    Last Pain:  Vitals:   12/22/20 1225  TempSrc:   PainSc: 0-No pain                 Lidia Collum

## 2020-12-22 NOTE — Op Note (Signed)
OPERATIVE NOTE  PATIENT: Markeeta Scalf DATE: 12/22/20   Preop Diagnosis: VIN 2  Postoperative Diagnosis: same  Surgery: CO2 laser of the vulva  Surgeons:  Donaciano Eva, MD Assistant: none  Anesthesia: General   Estimated blood loss: <31ml  IVF:  273ml   Urine output: 704 ml   Complications: None   Pathology: none   Operative findings: horseshoe shaped configuration of acetowhite changes to the posterior introitus and a separate 2cm area of acetowhite changes to the right anterior labia minora.   Procedure: The patient was identified in the preoperative holding area. Informed consent was signed on the chart. Patient was seen history was reviewed and exam was performed.   The patient was then taken to the operating room and placed in the supine position with SCD hose on. General anesthesia was then induced without difficulty. She was then placed in the dorsolithotomy position. The perineum was prepped with Betadine. The vagina was prepped with Betadine. The patient was then draped after the prep was dried. A Foley catheter was inserted into the bladder under sterile conditions to drain the bladder then removed.  Timeout was performed the patient, procedure, antibiotic, allergy, and length of procedure. 5% acetic acid solution was applied to the perineum. The vulvar tissues were inspected for areas of acetowhite changes or leukoplakia. The lesion was identified and the marking pen was used to circumscribe the area with appropriate surgical margins. The subcuticular tissues were infiltrated with marcaine and exparel mixture.   The patient's surgical field was draped with wet towels. The staff and patient ensured laser-safe eyewear and masks were fitted. The laser was set to 12 watts continuous. The laser was tested for accuracy on a tongue depressor.  The laser was applied to the circumscribed area of the vulva that had been previously identified. The tissue was  ablated to the desired depth and the eschar was removed with a moistened sponge. When the entire lesion had been ablated the procedure was complete.  Silvadine cream was applied to the laser site.  All instrument, suture, laparotomy, Ray-Tec, and needle counts were correct x2. The patient tolerated the procedure well and was taken recovery room in stable condition. This is Everitt Amber dictating an operative note on Avila Albritton.  Donaciano Eva, MD

## 2020-12-22 NOTE — Transfer of Care (Signed)
Immediate Anesthesia Transfer of Care Note  Patient: Brooke Baldwin  Procedure(s) Performed: CO2 LASER APPLICATION (N/A Vulva)  Patient Location: PACU  Anesthesia Type:General  Level of Consciousness: drowsy  Airway & Oxygen Therapy: Patient Spontanous Breathing and Patient connected to nasal cannula oxygen  Post-op Assessment: Report given to RN and Post -op Vital signs reviewed and stable  Post vital signs: Reviewed and stable  Last Vitals:  Vitals Value Taken Time  BP 120/85 12/22/20 1047  Temp    Pulse 80 12/22/20 1047  Resp 13 12/22/20 1047  SpO2 100 % 12/22/20 1047  Vitals shown include unvalidated device data.  Last Pain:  Vitals:   12/22/20 0858  TempSrc: Oral  PainSc: 0-No pain      Patients Stated Pain Goal: 7 (29/98/06 9996)  Complications: No complications documented.

## 2020-12-22 NOTE — Anesthesia Procedure Notes (Signed)
Procedure Name: LMA Insertion Date/Time: 12/22/2020 10:12 AM Performed by: Bonney Aid, CRNA Pre-anesthesia Checklist: Patient identified, Emergency Drugs available, Suction available and Patient being monitored Patient Re-evaluated:Patient Re-evaluated prior to induction Oxygen Delivery Method: Circle system utilized Preoxygenation: Pre-oxygenation with 100% oxygen Induction Type: IV induction Ventilation: Mask ventilation without difficulty LMA: LMA inserted LMA Size: 4.0 Number of attempts: 1 Airway Equipment and Method: Bite block Placement Confirmation: positive ETCO2 Tube secured with: Tape Dental Injury: Teeth and Oropharynx as per pre-operative assessment

## 2020-12-23 ENCOUNTER — Encounter (HOSPITAL_BASED_OUTPATIENT_CLINIC_OR_DEPARTMENT_OTHER): Payer: Self-pay | Admitting: Gynecologic Oncology

## 2020-12-23 ENCOUNTER — Telehealth: Payer: Self-pay

## 2020-12-23 ENCOUNTER — Encounter: Payer: Self-pay | Admitting: Gynecologic Oncology

## 2020-12-23 ENCOUNTER — Telehealth: Payer: Self-pay | Admitting: *Deleted

## 2020-12-23 NOTE — Telephone Encounter (Signed)
Spoke with Brooke Baldwin this morning, she rates her pain 0/10. States that she is eating, drinking, and urinating well. Passing gas but no BM yet. Denies fever or chills. Reinforced using frozen peas to surgical site to help with swelling and to apply silvadene cream.   Pt aware of post op appointments as well as the office number [phone] and after hours number [phone] to call if she has any questions or concerns

## 2020-12-23 NOTE — Telephone Encounter (Signed)
Patient called and requested an excuse from work note for two weeks

## 2020-12-23 NOTE — Telephone Encounter (Signed)
Left the patient a message that her work note is ready in my chart

## 2020-12-25 LAB — HIV-1 RNA QUANT-NO REFLEX-BLD
HIV 1 RNA Quant: 53 Copies/mL — ABNORMAL HIGH
HIV-1 RNA Quant, Log: 1.72 Log cps/mL — ABNORMAL HIGH

## 2020-12-26 ENCOUNTER — Telehealth: Payer: Self-pay | Admitting: *Deleted

## 2020-12-26 ENCOUNTER — Encounter: Payer: Self-pay | Admitting: Surgery

## 2020-12-26 ENCOUNTER — Encounter: Payer: Self-pay | Admitting: *Deleted

## 2020-12-26 ENCOUNTER — Ambulatory Visit (INDEPENDENT_AMBULATORY_CARE_PROVIDER_SITE_OTHER): Payer: POS | Admitting: Surgery

## 2020-12-26 ENCOUNTER — Other Ambulatory Visit: Payer: Self-pay

## 2020-12-26 VITALS — BP 129/95 | HR 100 | Temp 98.1°F | Resp 20 | Ht 60.0 in | Wt 172.0 lb

## 2020-12-26 DIAGNOSIS — Z992 Dependence on renal dialysis: Secondary | ICD-10-CM | POA: Diagnosis not present

## 2020-12-26 DIAGNOSIS — N186 End stage renal disease: Secondary | ICD-10-CM | POA: Diagnosis not present

## 2020-12-26 NOTE — Progress Notes (Signed)
[facility] [place]  Patient name: Brooke Baldwin MRN: [id number] DOB: Sep 27, 1975 Sex: female   REQUESTING PROVIDER:    Dr. Joelyn Oms  REASON FOR CONSULT:    Dialysis access   HISTORY OF PRESENT ILLNESS:   Brooke Baldwin is a 45 y.o. female, who is referred for evaluation of peritoneal dialysis.  She is currently dialyzing through a right sided tunneled dialysis catheter that is intermittently functioning.  She has a history of a left forearm graft which functioned for approximately a month.  She still has residual numbness in her left hand.  She is refusing access in her right arm or legs.  She has not had any prior abdominal surgeries.  PAST MEDICAL HISTORY    Past Medical History:  Diagnosis Date  . Anemia associated with chronic renal failure   . Atrophic kidney    bilateral  . CIN I (cervical intraepithelial neoplasia I)   . ESRD (end stage renal disease) on dialysis [facility]) 05/2020   hemodialysis  T/ Th/ Sat  @ [facility] ([address] in [place], ( 10-17-2020  s/p left AVG 11/ 2021 unable to use as of yet, currently using right IJ tunneled cath (right side of upper chest) for dialysis  . FSGS (focal segmental glomerulosclerosis)    renal biopsy 04-15-2020 in epic , secondary to HTN/ HIV/ Obese  . Heart palpitations 11/ 2021  (10-17-2020  pt stated the palpitations does not bother her and has no other symptoms , stated they started 11/ 2021)   pt was been seen by cardiology, dr s. Patria Mane ([facility] in [place]),  per pt had event monitor for 17 days , was told normal with  no arrhythmias,  and stated echo is scheduled for 10-28-2020  . History of vulvar dysplasia   . HIV (human immunodeficiency virus infection) ([place])    followed by ID-- dr a. wallace  . Hypertension   . S/P arteriovenous (AV) fistula creation 07-04-2020 @[facility]    left forearm,  07-28-2020  angioplasty of thrombosis  . Secondary  hyperparathyroidism of renal origin ([place])   . Tachycardia   . Trigeminal neuralgia    ride side ---  neurologist--- dr Krista Blue   (10-17-2020  pt stated last episode about 4 months ago was mild)  . VIN II (vulvar intraepithelial neoplasia II)      FAMILY HISTORY   Family History  Problem Relation Age of Onset  . Diabetes Mother   . Healthy Father   . Breast cancer Maternal Aunt   . Pancreatic cancer Maternal Grandfather   . Colon cancer Neg Hx   . Colon polyps Neg Hx   . Esophageal cancer Neg Hx   . Rectal cancer Neg Hx   . Stomach cancer Neg Hx   . Prostate cancer Neg Hx   . Endometrial cancer Neg Hx   . Ovarian cancer Neg Hx     SOCIAL HISTORY:   Social History   Socioeconomic History  . Marital status: Single    Spouse name: Not on file  . Number of children: 0  . Years of education: college  . Highest education level: Bachelor's degree (e.g., BA, AB, BS)  Occupational History  . Occupation: Administrator, arts  Tobacco Use  . Smoking status: Never Smoker  . Smokeless tobacco: Never Used  Vaping Use  . Vaping Use: Never used  Substance and Sexual Activity  . Alcohol use: Not Currently  . Drug use: Not Currently  . Sexual activity: Not on  file  Other Topics Concern  . Not on file  Social History Narrative   Lives at home with significant other.   Right-handed.   No daily caffeine use.   Social Determinants of Health   Financial Resource Strain: Not on file  Food Insecurity: Not on file  Transportation Needs: Not on file  Physical Activity: Not on file  Stress: Not on file  Social Connections: Not on file  Intimate Partner Violence: Not on file    ALLERGIES:    Allergies  Allergen Reactions  . Baclofen Other (See Comments)    Caused intolerable dizziness   . Gabapentin Other (See Comments)    Caused intolerable dizziness  . Penicillins Other (See Comments)    Unknown childhood reaction     CURRENT MEDICATIONS:    Current Outpatient Medications   Medication Sig Dispense Refill  . bictegravir-emtricitabine-tenofovir AF (BIKTARVY) 50-200-25 MG TABS tablet Take 1 tablet by mouth daily. (Patient taking differently: Take 1 tablet by mouth at bedtime.) 30 tablet 5  . hydrOXYzine (ATARAX/VISTARIL) 25 MG tablet TAKE 1 TABLET (25 MG TOTAL) BY MOUTH EVERY EIGHT HOURS AS NEEDED FOR UP TO 7 DAYS FOR ITCHING. 21 tablet 0  . multivitamin (RENA-VIT) TABS tablet Take 1 tablet by mouth at bedtime.    . multivitamin (RENA-VIT) TABS tablet TAKE 1 TABLET BY MOUTH AT BEDTIME. 30 tablet 0  . oxyCODONE (OXY IR/ROXICODONE) 5 MG immediate release tablet TAKE 1 TABLET (5 MG TOTAL) BY MOUTH EVERY EIGHT HOURS AS NEEDED FOR UP TO 5 DAYS FOR SEVERE PAIN. 15 tablet 0  . sevelamer carbonate (RENVELA) 800 MG tablet TAKE 2 TABLETS (1,600 MG TOTAL) BY MOUTH THREE TIMES DAILY WITH MEALS. 180 tablet 0  . triamcinolone (KENALOG) 0.1 % Apply topically 2 (two) times daily as needed.     No current facility-administered medications for this visit.    REVIEW OF SYSTEMS:   [X]  denotes positive finding, [ ]  denotes negative finding Cardiac  Comments:  Chest pain or chest pressure:    Shortness of breath upon exertion:    Short of breath when lying flat:    Irregular heart rhythm:        Vascular    Pain in calf, thigh, or hip brought on by ambulation:    Pain in feet at night that wakes you up from your sleep:     Blood clot in your veins:    Leg swelling:         Pulmonary    Oxygen at home:    Productive cough:     Wheezing:         Neurologic    Sudden weakness in arms or legs:     Sudden numbness in arms or legs:     Sudden onset of difficulty speaking or slurred speech:    Temporary loss of vision in one eye:     Problems with dizziness:         Gastrointestinal    Blood in stool:      Vomited blood:         Genitourinary    Burning when urinating:     Blood in urine:        Psychiatric    Major depression:         Hematologic    Bleeding  problems:    Problems with blood clotting too easily:        Skin    Rashes or ulcers:  Constitutional    Fever or chills:     PHYSICAL EXAM:   Vitals:   12/26/20 1111  BP: (!) 129/95  Pulse: 100  Resp: 20  Temp: 98.1 F (36.7 C)  SpO2: 99%  Weight: 172 lb (78 kg)  Height: 5' (1.524 m)    GENERAL: The patient is a well-nourished female, in no acute distress. The vital signs are documented above. CARDIAC: There is a regular rate and rhythm.  VASCULAR: Occluded left forearm graft PULMONARY: Nonlabored respirations ABDOMEN: Soft and non-tender  MUSCULOSKELETAL: There are no major deformities or cyanosis. NEUROLOGIC: No focal weakness or paresthesias are detected. SKIN: There are no ulcers or rashes noted. PSYCHIATRIC: The patient has a normal affect.  STUDIES:     ASSESSMENT and PLAN   ESRD: We discussed proceeding with peritoneal dialysis catheter placement.  I discussed the details of the operation including the risks and benefits.  She is going to further discuss this with Dr. Joelyn Oms and will contact us for scheduling.  We did discuss getting on the transplant list given her challenges with access.   Leia Alf, MD, FACS [facility] [phone] Pager [phone]

## 2020-12-26 NOTE — Telephone Encounter (Signed)
Returned the patient's call and left a message stating "the office sent your work in a my chart message to you and also emailed the note."

## 2020-12-27 NOTE — Telephone Encounter (Signed)
Opened in error

## 2020-12-29 ENCOUNTER — Other Ambulatory Visit: Payer: Self-pay

## 2021-01-05 ENCOUNTER — Telehealth (INDEPENDENT_AMBULATORY_CARE_PROVIDER_SITE_OTHER): Payer: POS | Admitting: Internal Medicine

## 2021-01-05 ENCOUNTER — Other Ambulatory Visit: Payer: Self-pay

## 2021-01-05 DIAGNOSIS — B20 Human immunodeficiency virus [HIV] disease: Secondary | ICD-10-CM

## 2021-01-05 DIAGNOSIS — Z298 Encounter for other specified prophylactic measures: Secondary | ICD-10-CM

## 2021-01-05 MED ORDER — ATOVAQUONE 750 MG/5ML PO SUSP: 1500.0000 mg | Freq: Every day | ORAL | 0 refills | Status: DC

## 2021-01-05 MED ORDER — BIKTARVY 50-200-25 MG PO TABS: 1.0000 | ORAL_TABLET | Freq: Every day | ORAL | 5 refills | Status: DC

## 2021-01-05 NOTE — Assessment & Plan Note (Signed)
Her CD4 count remains less than 200.  She has not been taking atovaquone and we discussed resuming this which she is agreeable to.  Refill sent today.

## 2021-01-05 NOTE — Assessment & Plan Note (Signed)
She is doing well on Biktarvy and reports no missed doses.  She has had a nice drop in her viral load over the past several months with most recent viral load about 50 copies.  Refills ordered today on her Biktarvy.

## 2021-01-05 NOTE — Progress Notes (Signed)
[facility] for Infectious Disease   CHIEF COMPLAINT    HIV follow up.    SUBJECTIVE:    Brooke Baldwin is a 45 y.o. female with PMHx as below who presents to the clinic for HIV follow up.  She continues on [place] without issues.  She is not taking atovaquone for about the past month.  She thought it may have been causing her GI upset but this may have been due to her binders as well because when her nephrologist changed her binders the GI issues improved.   Please see A&P for the details of today's visit and status of the patient's medical problems.   Patient's Medications  New Prescriptions   ATOVAQUONE (MEPRON) 750 MG/5ML SUSPENSION    Take 10 mLs (1,500 mg total) by mouth daily.  Previous Medications   HYDROXYZINE (ATARAX/VISTARIL) 25 MG TABLET    TAKE 1 TABLET (25 MG TOTAL) BY MOUTH EVERY EIGHT HOURS AS NEEDED FOR UP TO 7 DAYS FOR ITCHING.   MULTIVITAMIN (RENA-VIT) TABS TABLET    Take 1 tablet by mouth at bedtime.   MULTIVITAMIN (RENA-VIT) TABS TABLET    TAKE 1 TABLET BY MOUTH AT BEDTIME.   SEVELAMER CARBONATE (RENVELA) 800 MG TABLET    TAKE 2 TABLETS (1,600 MG TOTAL) BY MOUTH THREE TIMES DAILY WITH MEALS.   TRIAMCINOLONE (KENALOG) 0.1 %    Apply topically 2 (two) times daily as needed.  Modified Medications   Modified Medication Previous Medication   BICTEGRAVIR-EMTRICITABINE-TENOFOVIR AF (BIKTARVY) 50-200-25 MG TABS TABLET bictegravir-emtricitabine-tenofovir AF (BIKTARVY) 50-200-25 MG TABS tablet      Take 1 tablet by mouth daily.    Take 1 tablet by mouth daily.  Discontinued Medications   No medications on file      Past Medical History:  Diagnosis Date  . Anemia associated with chronic renal failure   . Atrophic kidney    bilateral  . CIN I (cervical intraepithelial neoplasia I)   . ESRD (end stage renal disease) on dialysis [facility]) 05/2020   hemodialysis  T/ Th/ Sat  @ [facility] ([address] in [place], ( 10-17-2020  s/p left AVG 11/ 2021 unable to  use as of yet, currently using right IJ tunneled cath (right side of upper chest) for dialysis  . FSGS (focal segmental glomerulosclerosis)    renal biopsy 04-15-2020 in epic , secondary to HTN/ HIV/ Obese  . Heart palpitations 11/ 2021  (10-17-2020  pt stated the palpitations does not bother her and has no other symptoms , stated they started 11/ 2021)   pt was been seen by cardiology, dr s. Patria Mane ([facility] in [place]),  per pt had event monitor for 17 days , was told normal with  no arrhythmias,  and stated echo is scheduled for 10-28-2020  . History of vulvar dysplasia   . HIV (human immunodeficiency virus infection) ([place])    followed by ID-- dr a. Areli Jowett  . Hypertension   . S/P arteriovenous (AV) fistula creation 07-04-2020 @[facility]    left forearm,  07-28-2020  angioplasty of thrombosis  . Secondary hyperparathyroidism of renal origin ([place])   . Tachycardia   . Trigeminal neuralgia    ride side ---  neurologist--- dr Krista Blue   (10-17-2020  pt stated last episode about 4 months ago was mild)  . VIN II (vulvar intraepithelial neoplasia II)     Social History   Tobacco Use  . Smoking status: Never Smoker  . Smokeless tobacco: Never Used  Vaping Use  . Vaping Use: Never used  Substance Use Topics  . Alcohol use: Not Currently  . Drug use: Not Currently    Family History  Problem Relation Age of Onset  . Diabetes Mother   . Healthy Father   . Breast cancer Maternal Aunt   . Pancreatic cancer Maternal Grandfather   . Colon cancer Neg Hx   . Colon polyps Neg Hx   . Esophageal cancer Neg Hx   . Rectal cancer Neg Hx   . Stomach cancer Neg Hx   . Prostate cancer Neg Hx   . Endometrial cancer Neg Hx   . Ovarian cancer Neg Hx     Allergies  Allergen Reactions  . Baclofen Other (See Comments)    Caused intolerable dizziness   . Gabapentin Other (See Comments)    Caused intolerable dizziness  . Penicillins Other (See Comments)    Unknown childhood reaction     Review of  Systems  Constitutional: Negative.   Cardiovascular: Negative.   Gastrointestinal: Negative.      OBJECTIVE:     Physical Exam Constitutional:      Comments: Patient seen briefly during video visit before technical issues required phone call.  She appeared well     Labs and Microbiology: CMP Latest Ref Rng & Units 12/22/2020 08/19/2020 07/28/2020  Glucose 70 - 99 mg/dL 82 85 87  BUN 6 - 20 mg/dL 96(H) 16 34(H)  Creatinine 0.44 - 1.00 mg/dL 10.60(H) 4.26(H) 7.20(H)  Sodium 135 - 145 mmol/L 137 141 139  Potassium 3.5 - 5.1 mmol/L 4.8 3.1(L) 3.4(L)  Chloride 98 - 111 mmol/L 109 101 100  CO2 22 - 32 mmol/L 17(L) 33(H) -  Calcium 8.9 - 10.3 mg/dL 9.6 8.3(L) -  Total Protein 6.1 - 8.1 g/dL - 6.4 -  Total Bilirubin 0.2 - 1.2 mg/dL - 0.5 -  Alkaline Phos 38 - 126 U/L - - -  AST 10 - 35 U/L - 96(H) -  ALT 6 - 29 U/L - 44(H) -   CBC Latest Ref Rng & Units 12/22/2020 08/19/2020 07/28/2020  WBC 4.0 - 10.5 K/uL 3.5(L) 3.4(L) -  Hemoglobin 12.0 - 15.0 g/dL 11.1(L) 8.0(L) 7.5(L)  Hematocrit 36.0 - 46.0 % 35.3(L) 24.7(L) 22.0(L)  Platelets 150 - 400 K/uL 294 256 -     Lab Results  Component Value Date   HIV1RNAQUANT 53 (H) 12/21/2020   HIV1RNAQUANT 1,730 (H) 08/19/2020   HIV1RNAQUANT 2,420,000 07/03/2020   CD4TABS 42 (L) 12/21/2020   CD4TABS <35 (L) 07/01/2020    RPR and STI: Lab Results  Component Value Date   LABRPR NON REACTIVE 07/03/2020    No flowsheet data found.  Hepatitis B: Lab Results  Component Value Date   HEPBSAG NON REACTIVE 07/03/2020   HEPBCAB NON REACTIVE 07/03/2020   Hepatitis C: No results found for: HEPCAB, HCVRNAPCRQN Hepatitis A: Lab Results  Component Value Date   HAV NON REACTIVE 07/03/2020   Lipids: Lab Results  Component Value Date   CHOL 159 08/19/2020   TRIG 73 08/19/2020   HDL 71 08/19/2020   CHOLHDL 2.2 08/19/2020   [place] 73 08/19/2020     ASSESSMENT & PLAN:    HIV (human immunodeficiency virus infection) ([place]) She is  doing well on Biktarvy and reports no missed doses.  She has had a nice drop in her viral load over the past several months with most recent viral load about 50 copies.  Refills ordered today on her Biktarvy.  Need for pneumocystis prophylaxis Her CD4 count remains less than 200.  She has not been taking atovaquone and we discussed resuming this which she is agreeable to.  Refill sent today.   No orders of the defined types were placed in this encounter.    [facility] for Infectious Disease [facility] 01/05/2021, 2:52 PM  Virtual Visit via Telephone Note.  Video visit converted to phone visit due to technical issues   I connected with Brooke Baldwin on 01/05/21 at 2:52 PM by telephone and verified that I am speaking with the correct person using two identifiers.   I discussed the limitations, risks, security and privacy concerns of performing an evaluation and management service by telephone and the availability of in person appointments. I also discussed with the patient that there may be a patient responsible charge related to this service. The patient expressed understanding and agreed to proceed.  Patient location: home My location: rcid Duration of call: 22 min

## 2021-01-16 ENCOUNTER — Other Ambulatory Visit: Payer: Self-pay | Admitting: Family

## 2021-01-16 DIAGNOSIS — B20 Human immunodeficiency virus [HIV] disease: Secondary | ICD-10-CM

## 2021-01-20 ENCOUNTER — Inpatient Hospital Stay: Payer: POS | Attending: Gynecologic Oncology | Admitting: Gynecologic Oncology

## 2021-01-20 ENCOUNTER — Other Ambulatory Visit: Payer: Self-pay

## 2021-01-20 ENCOUNTER — Encounter: Payer: Self-pay | Admitting: Gynecologic Oncology

## 2021-01-20 VITALS — BP 132/93 | HR 89 | Temp 97.8°F | Resp 16 | Ht 60.0 in | Wt 172.4 lb

## 2021-01-20 DIAGNOSIS — N2581 Secondary hyperparathyroidism of renal origin: Secondary | ICD-10-CM | POA: Insufficient documentation

## 2021-01-20 DIAGNOSIS — N901 Moderate vulvar dysplasia: Secondary | ICD-10-CM | POA: Insufficient documentation

## 2021-01-20 DIAGNOSIS — Z9889 Other specified postprocedural states: Secondary | ICD-10-CM | POA: Diagnosis not present

## 2021-01-20 DIAGNOSIS — I120 Hypertensive chronic kidney disease with stage 5 chronic kidney disease or end stage renal disease: Secondary | ICD-10-CM | POA: Diagnosis not present

## 2021-01-20 DIAGNOSIS — Z21 Asymptomatic human immunodeficiency virus [HIV] infection status: Secondary | ICD-10-CM | POA: Insufficient documentation

## 2021-01-20 DIAGNOSIS — Z79899 Other long term (current) drug therapy: Secondary | ICD-10-CM | POA: Diagnosis not present

## 2021-01-20 DIAGNOSIS — N186 End stage renal disease: Secondary | ICD-10-CM | POA: Diagnosis not present

## 2021-01-20 DIAGNOSIS — N870 Mild cervical dysplasia: Secondary | ICD-10-CM | POA: Insufficient documentation

## 2021-01-20 DIAGNOSIS — Z992 Dependence on renal dialysis: Secondary | ICD-10-CM | POA: Insufficient documentation

## 2021-01-20 DIAGNOSIS — N269 Renal sclerosis, unspecified: Secondary | ICD-10-CM | POA: Diagnosis not present

## 2021-01-20 NOTE — Progress Notes (Signed)
Follow-up Note: Gyn-Onc  Consult was requested by Dr. Landry Mellow for the evaluation of Brooke Baldwin 45 y.o. female  CC:  Chief Complaint  Patient presents with   VIN II (vulvar intraepithelial neoplasia II)   CIN I (cervical intraepithelial neoplasia I)    Assessment/Plan:  [name]  is a 45 y.o.  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  S/p CO2 laser on5/19/22.  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 Landry Mellow with annual cervical Paps and HPV cotesting to monitor for development of high-grade dysplasia which would justify intervention.  I counseled her about the high risk for recurrence and the need for close surveillance and the potential for the development of invasive carcinoma which is elevated given her history of HIV positivity.   HPI: [name] is a 45 year old woman who was seen in consultation at the request of Dr Landry Mellow for evaluation of vulvar dysplasia.  The patient has a longstanding history of HIV positivity.  She is actively treated for this and reported having an undetectable viral load.  She takes [employer].  She had a history of having vulvar dysplasia treated remotely in the past in [place] with an excisional procedure for what she describes as precancer.  The patient was due for her annual gynecologic visit in January 2022 and also noted symptomatic right pruritus on the labia.  She saw Dr. Landry Mellow on August 18, 2020 and Pap smear at that time showed L SIL positive for high-risk HPV.  She return for colposcopy and cervical biopsy on September 23, 2020 which revealed a low-grade squamous intraepithelial lesion of the cervix.  At that time a biopsy was taken from the perineum and the right labia majora which showed moderate dysplasia, VIN 2.  Her medical history is most significant for HIV positivity, see above.  She  also has a history of developing end-stage renal disease in November 2021.  The patient is unclear what prompted this diagnosis however she is treated with dialysis on Tuesdays Thursdays and Saturdays.  Interval Hx:  The patient had surgery canceled multiple occasions due to challenges with attending the surgery due to dialysis schedules.  Her surgery was eventually successfully performed on 12/22/2020 and included CO2 laser of the vulva.  Intraoperative findings were significant for horseshoe shaped configuration of acetowhite changes to the posterior introitus and a separate 2 cm area of acetowhite change to the right anterior labia minora all areas were lasered with no residual disease present at the completion.  Postoperatively the patient did well with minimal complaints   Current Meds:  Outpatient Encounter Medications as of 01/20/2021  Medication Sig   atovaquone (MEPRON) 750 MG/5ML suspension Take 10 mLs (1,500 mg total) by mouth daily.   AURYXIA 1 GM 210 MG(Fe) tablet SMARTSIG:2 Tablet(s) By Mouth 5 Times Daily   bictegravir-emtricitabine-tenofovir AF (BIKTARVY) 50-200-25 MG TABS tablet Take 1 tablet by mouth daily.   multivitamin (RENA-VIT) TABS tablet Take 1 tablet by mouth at bedtime.   triamcinolone (KENALOG) 0.1 % Apply topically 2 (two) times daily as needed.   [DISCONTINUED] hydrOXYzine (ATARAX/VISTARIL) 25 MG tablet TAKE 1 TABLET (25 MG TOTAL) BY MOUTH EVERY EIGHT HOURS AS NEEDED FOR UP TO 7 DAYS FOR ITCHING.   [DISCONTINUED] multivitamin (RENA-VIT) TABS tablet TAKE 1 TABLET BY MOUTH AT BEDTIME.   [DISCONTINUED] sevelamer carbonate (RENVELA) 800 MG tablet  TAKE 2 TABLETS (1,600 MG TOTAL) BY MOUTH THREE TIMES DAILY WITH MEALS.   No facility-administered encounter medications on file as of 01/20/2021.    Allergy:  Allergies  Allergen Reactions   Baclofen Other (See Comments)    Caused intolerable dizziness    Gabapentin Other (See Comments)    Caused intolerable dizziness    Penicillins Other (See Comments)    Unknown childhood reaction     Social Hx:   Social History   Socioeconomic History   Marital status: Single    Spouse name: Not on file   Number of children: 0   Years of education: college   Highest education level: Bachelor's degree (e.g., BA, AB, BS)  Occupational History   Occupation: Administrator, arts  Tobacco Use   Smoking status: Never   Smokeless tobacco: Never  Vaping Use   Vaping Use: Never used  Substance and Sexual Activity   Alcohol use: Not Currently   Drug use: Not Currently   Sexual activity: Not on file  Other Topics Concern   Not on file  Social History Narrative   Lives at home with significant other.   Right-handed.   No daily caffeine use.   Social Determinants of Health   Financial Resource Strain: Not on file  Food Insecurity: Not on file  Transportation Needs: Not on file  Physical Activity: Not on file  Stress: Not on file  Social Connections: Not on file  Intimate Partner Violence: Not on file    Past Surgical Hx:  Past Surgical History:  Procedure Laterality Date   A/V FISTULAGRAM Left 07/28/2020   Procedure: A/V FISTULAGRAM;  Surgeon: Marty Heck, MD;  Location: [facility];  Service: Cardiovascular;  Laterality: Left;   AV FISTULA PLACEMENT Left 07/04/2020   Procedure: INSERTION OF LEFT ARTERIOVENOUS (AV) GORE-TEX GRAFT ARM (ACUSEAL);  Surgeon: Cherre Robins, MD;  Location: [facility];  Service: Vascular;  Laterality: Left;   CERVICAL BIOPSY  W/ LOOP ELECTRODE EXCISION  1157   CO2 LASER APPLICATION N/A 2/62/0355   Procedure: CO2 LASER APPLICATION;  Surgeon: Everitt Amber, MD;  Location: [facility];  Service: Gynecology;  Laterality: N/A;   ORIF ANKLE FRACTURE Right 2012   per pt retained hardware   PERIPHERAL VASCULAR BALLOON ANGIOPLASTY Left 07/28/2020   Procedure: PERIPHERAL VASCULAR BALLOON ANGIOPLASTY;  Surgeon: Marty Heck, MD;  Location: [facility];   Service: Cardiovascular;  Laterality: Left;  AVF   VULVA SURGERY  2013   excision    Past Medical Hx:  Past Medical History:  Diagnosis Date   Anemia associated with chronic renal failure    Atrophic kidney    bilateral   CIN I (cervical intraepithelial neoplasia I)    ESRD (end stage renal disease) on dialysis ([place]) 05/2020   hemodialysis  T/ Th/ Sat  @ [facility] ([address] in [place], ( 10-17-2020  s/p left AVG 11/ 2021 unable to use as of yet, currently using right IJ tunneled cath (right side of upper chest) for dialysis   FSGS (focal segmental glomerulosclerosis)    renal biopsy 04-15-2020 in epic , secondary to HTN/ HIV/ Obese   Heart palpitations 11/ 2021  (10-17-2020  pt stated the palpitations does not bother her and has no other symptoms , stated they started 11/ 2021)   pt was been seen by cardiology, dr s. Patria Mane ([facility] in [place]),  per pt had event monitor for 17 days , was told normal with  no arrhythmias,  and stated echo is scheduled for 10-28-2020   History of vulvar dysplasia    HIV (human immunodeficiency virus infection) ([place])    followed by ID-- dr a. wallace   Hypertension    S/P arteriovenous (AV) fistula creation 07-04-2020 @[facility]    left forearm,  07-28-2020  angioplasty of thrombosis   Secondary hyperparathyroidism of renal origin ([place])    Tachycardia    Trigeminal neuralgia    ride side ---  neurologist--- dr Krista Blue   (10-17-2020  pt stated last episode about 4 months ago was mild)   VIN II (vulvar intraepithelial neoplasia II)     Past Gynecological History:  See HPI Patient's last menstrual period was 04/02/2015 (approximate).  Family Hx:  Family History  Problem Relation Age of Onset   Diabetes Mother    Healthy Father    Breast cancer Maternal Aunt    Pancreatic cancer Maternal Grandfather    Colon cancer Neg Hx    Colon polyps Neg Hx    Esophageal cancer Neg Hx    Rectal cancer Neg Hx    Stomach cancer Neg Hx    Prostate cancer Neg Hx     Endometrial cancer Neg Hx    Ovarian cancer Neg Hx     Review of Systems:  Constitutional  Feels well,   ENT Normal appearing ears and nares bilaterally Skin/Breast  No rash, sores, jaundice, itching, dryness Cardiovascular  No chest pain, shortness of breath, or edema  Pulmonary  No cough or wheeze.  Gastro Intestinal  No nausea, vomitting, or diarrhoea. No bright red blood per rectum, no abdominal pain, change in bowel movement, or constipation.  Genito Urinary  No frequency, urgency, dysuria, no bleeding,  Musculo Skeletal  No myalgia, arthralgia, joint swelling or pain  Neurologic  No weakness, numbness, change in gait,  Psychology  No depression, anxiety, insomnia.   Vitals:  Blood pressure (!) 132/93, pulse 89, temperature 97.8 F (36.6 C), temperature source Tympanic, resp. rate 16, height 5' (1.524 m), weight 172 lb 6.4 oz (78.2 kg), last menstrual period 04/02/2015, SpO2 100 %.  Physical Exam: WD in NAD Neck  Supple NROM, without any enlargements.  Lymph Node Survey No cervical supraclavicular or inguinal adenopathy Cardiovascular  Pulse normal rate, regularity and rhythm. S1 and S2 normal.  Lungs  Clear to auscultation bilateraly, without wheezes/crackles/rhonchi. Good air movement.  Skin  No rash/lesions/breakdown  Psychiatry  Alert and oriented to person, place, and time  Abdomen  Normoactive bowel sounds, abdomen soft, non-tender and mildly obese without evidence of hernia.  Back No CVA tenderness Genito Urinary  Vulva/vagina: Well-healed vulva with no ulcerated areas and no visible or palpable VIN disease. Rectal  deferred Extremities  No bilateral cyanosis, clubbing or edema.   Thereasa Solo, MD  01/20/2021, 4:26 PM

## 2021-01-20 NOTE — Patient Instructions (Signed)
Dr Denman George is clearing you to return to all activities including intercourse.  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  Therefore it is important that Dr Landry Mellow evaluates the vulva in 6 months for evidence of new lesions. And if you experience new symptoms before then it should be addressed with her.

## 2021-01-24 ENCOUNTER — Encounter: Payer: Self-pay | Admitting: Gastroenterology

## 2021-01-24 ENCOUNTER — Telehealth: Payer: Self-pay

## 2021-01-24 NOTE — Telephone Encounter (Signed)
Received call from pt requesting to cancel PD catheter placement surgery for now. Reports she is in the process of changing dialysis centers and not sure if she will be following up with our office. She will contact our office back if decide to r/s surgery.

## 2021-01-25 ENCOUNTER — Ambulatory Visit: Admit: 2021-01-25 | Payer: POS | Admitting: Surgery

## 2021-01-25 SURGERY — LAPAROSCOPIC INSERTION CONTINUOUS AMBULATORY PERITONEAL DIALYSIS  (CAPD) CATHETER
Anesthesia: Choice

## 2021-04-07 ENCOUNTER — Emergency Department (HOSPITAL_COMMUNITY)
Admission: EM | Admit: 2021-04-07 | Discharge: 2021-04-07 | Disposition: A | Discharge status: Home / Self Care | Payer: POS | Attending: Emergency Medicine | Admitting: Emergency Medicine

## 2021-04-07 ENCOUNTER — Encounter (HOSPITAL_COMMUNITY): Payer: Self-pay | Admitting: Emergency Medicine

## 2021-04-07 ENCOUNTER — Other Ambulatory Visit: Payer: Self-pay

## 2021-04-07 DIAGNOSIS — Z21 Asymptomatic human immunodeficiency virus [HIV] infection status: Secondary | ICD-10-CM | POA: Diagnosis not present

## 2021-04-07 DIAGNOSIS — N186 End stage renal disease: Secondary | ICD-10-CM | POA: Diagnosis not present

## 2021-04-07 DIAGNOSIS — Z992 Dependence on renal dialysis: Secondary | ICD-10-CM | POA: Insufficient documentation

## 2021-04-07 DIAGNOSIS — Z20822 Contact with and (suspected) exposure to covid-19: Secondary | ICD-10-CM | POA: Diagnosis not present

## 2021-04-07 DIAGNOSIS — I120 Hypertensive chronic kidney disease with stage 5 chronic kidney disease or end stage renal disease: Secondary | ICD-10-CM | POA: Diagnosis not present

## 2021-04-07 DIAGNOSIS — Z79899 Other long term (current) drug therapy: Secondary | ICD-10-CM | POA: Insufficient documentation

## 2021-04-07 DIAGNOSIS — D631 Anemia in chronic kidney disease: Secondary | ICD-10-CM | POA: Diagnosis not present

## 2021-04-07 DIAGNOSIS — E875 Hyperkalemia: Secondary | ICD-10-CM | POA: Insufficient documentation

## 2021-04-07 LAB — BASIC METABOLIC PANEL
Anion gap: 6 (ref 5–15)
BUN: 69 mg/dL — ABNORMAL HIGH (ref 6–20)
CO2: 19 mmol/L — ABNORMAL LOW (ref 22–32)
Calcium: 9.3 mg/dL (ref 8.9–10.3)
Chloride: 115 mmol/L — ABNORMAL HIGH (ref 98–111)
Creatinine, Ser: 7.26 mg/dL — ABNORMAL HIGH (ref 0.44–1.00)
GFR, Estimated: 7 mL/min — ABNORMAL LOW (ref >60)
Glucose, Bld: 86 mg/dL (ref 70–99)
Potassium: 5.3 mmol/L — ABNORMAL HIGH (ref 3.5–5.1)
Sodium: 140 mmol/L (ref 135–145)

## 2021-04-07 LAB — CBC WITH DIFFERENTIAL/PLATELET
Abs Immature Granulocytes: 0.02 10*3/uL (ref 0.00–0.07)
Basophils Absolute: 0 10*3/uL (ref 0.0–0.1)
Basophils Relative: 0 %
Eosinophils Absolute: 0.8 10*3/uL — ABNORMAL HIGH (ref 0.0–0.5)
Eosinophils Relative: 11 %
HCT: 37.4 % (ref 36.0–46.0)
Hemoglobin: 12 g/dL (ref 12.0–15.0)
Immature Granulocytes: 0 %
Lymphocytes Relative: 19 %
Lymphs Abs: 1.3 10*3/uL (ref 0.7–4.0)
MCH: 33.4 pg (ref 26.0–34.0)
MCHC: 32.1 g/dL (ref 30.0–36.0)
MCV: 104.2 fL — ABNORMAL HIGH (ref 80.0–100.0)
Monocytes Absolute: 0.7 10*3/uL (ref 0.1–1.0)
Monocytes Relative: 10 %
Neutro Abs: 4 10*3/uL (ref 1.7–7.7)
Neutrophils Relative %: 60 %
Platelets: 283 10*3/uL (ref 150–400)
RBC: 3.59 MIL/uL — ABNORMAL LOW (ref 3.87–5.11)
RDW: 14.5 % (ref 11.5–15.5)
WBC: 6.8 10*3/uL (ref 4.0–10.5)
nRBC: 0 % (ref 0.0–0.2)

## 2021-04-07 MED ORDER — SODIUM ZIRCONIUM CYCLOSILICATE 10 G PO PACK
10.0000 g | PACK | Freq: Once | ORAL | Status: AC
  Administered 2021-04-07: 10 g via ORAL
  Filled 2021-04-07: qty 1

## 2021-04-07 MED ORDER — CHLORHEXIDINE GLUCONATE CLOTH 2 % EX PADS: 6.0000 | MEDICATED_PAD | Freq: Every day | CUTANEOUS | Status: DC

## 2021-04-07 NOTE — ED Triage Notes (Signed)
Patient reports missing dialysis x3 weeks due to visiting sick mother. Denies complaints. Went to PCP for labs and redirected to ED for further evaluation.

## 2021-04-07 NOTE — Discharge Instructions (Addendum)
Go to your dialysis center tomorrow.  If there is any issue then return to the emergency department to [facility] and inquire about getting dialysis.

## 2021-04-07 NOTE — ED Provider Notes (Signed)
[facility] DEPT Provider Note   CSN: [id number] Arrival date & time: 04/07/21  1704     History Chief Complaint  Patient presents with   Needs dialysis    Brooke Baldwin is a 45 y.o. female.  45 yo F with a chief complaints of needing dialysis performed.  The patient is gone about 3 weeks without having dialysis.  She went to dialysis center today and was told that she needed some blood work before that could be performed.  She then tried to go to a family doctor's office and they told her that they were unable to obtain lab work there and that she should come to the emergency department.  She denies any significant discomfort denies any significant swelling.  Denies any difficulty breathing.  Denies feeling like she may pass out.  She gets dialysis through a tunneled catheter and has not had issues with it.  The history is provided by the patient.  Illness Severity:  Moderate Onset quality:  Gradual Duration:  3 weeks Timing:  Constant Progression:  Worsening Chronicity:  New Associated symptoms: no chest pain, no congestion, no fever, no headaches, no myalgias, no nausea, no rhinorrhea, no shortness of breath, no vomiting and no wheezing       Past Medical History:  Diagnosis Date   Anemia associated with chronic renal failure    Atrophic kidney    bilateral   CIN I (cervical intraepithelial neoplasia I)    ESRD (end stage renal disease) on dialysis ([place]) 05/2020   hemodialysis  T/ Th/ Sat  @ [facility] ([address] in [place], ( 10-17-2020  s/p left AVG 11/ 2021 unable to use as of yet, currently using right IJ tunneled cath (right side of upper chest) for dialysis   FSGS (focal segmental glomerulosclerosis)    renal biopsy 04-15-2020 in epic , secondary to HTN/ HIV/ Obese   Heart palpitations 11/ 2021  (10-17-2020  pt stated the palpitations does not bother her and has no other symptoms , stated they started 11/ 2021)   pt was been seen by  cardiology, dr s. Patria Mane ([facility] in [place]),  per pt had event monitor for 17 days , was told normal with  no arrhythmias,  and stated echo is scheduled for 10-28-2020   History of vulvar dysplasia    HIV (human immunodeficiency virus infection) ([place])    followed by ID-- dr a. wallace   Hypertension    S/P arteriovenous (AV) fistula creation 07-04-2020 @[facility]    left forearm,  07-28-2020  angioplasty of thrombosis   Secondary hyperparathyroidism of renal origin ([place])    Tachycardia    Trigeminal neuralgia    ride side ---  neurologist--- dr Krista Blue   (10-17-2020  pt stated last episode about 4 months ago was mild)   VIN II (vulvar intraepithelial neoplasia II)     Patient Active Problem List   Diagnosis Date Noted   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   Need for hepatitis B booster vaccination 09/30/2020   Need for pneumococcal vaccination 09/30/2020   Need for COVID-19 vaccine 09/30/2020   Palpitations 09/08/2020   Class 1 obesity due to excess calories with serious comorbidity and body mass index (BMI) of 31.0 to 31.9 in adult 09/08/2020   Primary hypertension 09/08/2020   Healthcare maintenance 08/19/2020   Need for pneumocystis prophylaxis 07/27/2020   Vaccine counseling 07/27/2020   Hypoalbuminemia 07/01/2020   Secondary hyperparathyroidism ([place]) 07/01/2020   Neutropenia ([place]) 07/01/2020  Thrombocytopenia ([place]) 07/01/2020   HIV (human immunodeficiency virus infection) ([place]) 06/30/2020   End stage renal disease ([place]) 06/30/2020   Elevated blood pressure reading without diagnosis of hypertension 06/30/2020   Right trigeminal neuralgia 12/09/2019    Past Surgical History:  Procedure Laterality Date   A/V FISTULAGRAM Left 07/28/2020   Procedure: A/V FISTULAGRAM;  Surgeon: Marty Heck, MD;  Location: [facility];  Service: Cardiovascular;  Laterality: Left;   AV FISTULA PLACEMENT Left 07/04/2020   Procedure: INSERTION OF LEFT  ARTERIOVENOUS (AV) GORE-TEX GRAFT ARM (ACUSEAL);  Surgeon: Cherre Robins, MD;  Location: [facility];  Service: Vascular;  Laterality: Left;   CERVICAL BIOPSY  W/ LOOP ELECTRODE EXCISION  6834   CO2 LASER APPLICATION N/A 1/96/2229   Procedure: CO2 LASER APPLICATION;  Surgeon: Everitt Amber, MD;  Location: [facility];  Service: Gynecology;  Laterality: N/A;   ORIF ANKLE FRACTURE Right 2012   per pt retained hardware   PERIPHERAL VASCULAR BALLOON ANGIOPLASTY Left 07/28/2020   Procedure: PERIPHERAL VASCULAR BALLOON ANGIOPLASTY;  Surgeon: Marty Heck, MD;  Location: [facility];  Service: Cardiovascular;  Laterality: Left;  AVF   VULVA SURGERY  2013   excision     OB History   No obstetric history on file.     Family History  Problem Relation Age of Onset   Diabetes Mother    Healthy Father    Breast cancer Maternal Aunt    Pancreatic cancer Maternal Grandfather    Colon cancer Neg Hx    Colon polyps Neg Hx    Esophageal cancer Neg Hx    Rectal cancer Neg Hx    Stomach cancer Neg Hx    Prostate cancer Neg Hx    Endometrial cancer Neg Hx    Ovarian cancer Neg Hx     Social History   Tobacco Use   Smoking status: Never   Smokeless tobacco: Never  Vaping Use   Vaping Use: Never used  Substance Use Topics   Alcohol use: Not Currently   Drug use: Not Currently    Home Medications Prior to Admission medications   Medication Sig Start Date End Date Taking? Authorizing Provider  atovaquone (MEPRON) 750 MG/5ML suspension Take 10 mLs (1,500 mg total) by mouth daily. 01/05/21   Mignon Pine, DO  AURYXIA 1 GM 210 MG(Fe) tablet SMARTSIG:2 Tablet(s) By Mouth 5 Times Daily 11/30/20   [provider]  bictegravir-emtricitabine-tenofovir AF (BIKTARVY) 50-200-25 MG TABS tablet Take 1 tablet by mouth daily. 01/05/21   Mignon Pine, DO  multivitamin (RENA-VIT) TABS tablet Take 1 tablet by mouth at bedtime.    [provider]  triamcinolone  (KENALOG) 0.1 % Apply topically 2 (two) times daily as needed. 10/06/20   [provider]    Allergies    Baclofen, Gabapentin, and Penicillins  Review of Systems   Review of Systems  Constitutional:  Negative for chills and fever.  HENT:  Negative for congestion and rhinorrhea.   Eyes:  Negative for redness and visual disturbance.  Respiratory:  Negative for shortness of breath and wheezing.   Cardiovascular:  Negative for chest pain and palpitations.  Gastrointestinal:  Negative for nausea and vomiting.  Genitourinary:  Negative for dysuria and urgency.  Musculoskeletal:  Negative for arthralgias and myalgias.  Skin:  Negative for pallor and wound.  Neurological:  Negative for dizziness and headaches.   Physical Exam Updated Vital Signs BP (!) 163/96   Pulse 62  Temp 98.9 F (37.2 C) (Oral)   Resp 19   LMP 04/02/2015 (Approximate)   SpO2 100%   Physical Exam Vitals and nursing note reviewed.  Constitutional:      General: She is not in acute distress.    Appearance: She is well-developed. She is not diaphoretic.  HENT:     Head: Normocephalic and atraumatic.  Eyes:     Pupils: Pupils are equal, round, and reactive to light.  Cardiovascular:     Rate and Rhythm: Normal rate and regular rhythm.     Heart sounds: No murmur heard.   No friction rub. No gallop.  Pulmonary:     Effort: Pulmonary effort is normal.     Breath sounds: No wheezing or rales.  Abdominal:     General: There is no distension.     Palpations: Abdomen is soft.     Tenderness: There is no abdominal tenderness.  Musculoskeletal:        General: No tenderness.     Cervical back: Normal range of motion and neck supple.     Right lower leg: No edema.     Left lower leg: No edema.     Comments: Right-sided tunneled dialysis catheter without any erythema or pain at the catheter site.  No significant edema.  Skin:    General: Skin is warm and dry.  Neurological:     Mental Status: She is  alert and oriented to person, place, and time.  Psychiatric:        Behavior: Behavior normal.    ED Results / Procedures / Treatments   Labs (all labs ordered are listed, but only abnormal results are displayed) Labs Reviewed  CBC WITH DIFFERENTIAL/PLATELET - Abnormal; Notable for the following components:      Result Value   RBC 3.59 (*)    MCV 104.2 (*)    Eosinophils Absolute 0.8 (*)    All other components within normal limits  BASIC METABOLIC PANEL - Abnormal; Notable for the following components:   Potassium 5.3 (*)    Chloride 115 (*)    CO2 19 (*)    BUN 69 (*)    Creatinine, Ser 7.26 (*)    GFR, Estimated 7 (*)    All other components within normal limits  RESP PANEL BY RT-PCR (FLU A&B, COVID) [id number]    EKG EKG Interpretation  Date/Time:  Friday April 07 2021 22:13:12 EDT Ventricular Rate:  56 PR Interval:  143 QRS Duration: 93 QT Interval:  437 QTC Calculation: 422 R Axis:   30 Text Interpretation: Sinus rhythm Since last tracing rate slower Confirmed by Deno Etienne [phone]) on 04/07/2021 10:18:01 PM  Radiology No results found.  Procedures Procedures   Medications Ordered in ED Medications  sodium zirconium cyclosilicate (LOKELMA) packet 10 g (has no administration in time range)    ED Course  I have reviewed the triage vital signs and the nursing notes.  Pertinent labs & imaging results that were available during my care of the patient were reviewed by me and considered in my medical decision making (see chart for details).    MDM Rules/Calculators/A&P                           45 yo F with a chief complaints of needing dialysis.  Patient was unable to get dialysis today due to no blood work in some time and so was sent to her family doctor's office who  then sent her here.  Blood work here without obvious urgent need for dialysis.  Patient with a very mild hyperkalemia.  I discussed the case with Dr. Justin Mend, nephrology he felt most reasonable  option would to put the patient into the hospital and got dialysis for her in the morning.  I discussed this with the patient, she feels that she will likely be able to get dialysis tomorrow and plans to try and go to the dialysis center.  She would prefer to hold off on being admitted and if she is unable to get dialysis plans to return to our hospital system over at [facility] to be reevaluated.  I did give her a dose of Lokelma.  Discharge home.  10:37 PM:  I have discussed the diagnosis/risks/treatment options with the patient and believe the pt to be eligible for discharge home to follow-up with PCP. We also discussed returning to the ED immediately if new or worsening sx occur. We discussed the sx which are most concerning (e.g., sudden worsening pain, fever, inability to tolerate by mouth) that necessitate immediate return. Medications administered to the patient during their visit and any new prescriptions provided to the patient are listed below.  Medications given during this visit Medications  sodium zirconium cyclosilicate (LOKELMA) packet 10 g (has no administration in time range)     The patient appears reasonably screen and/or stabilized for discharge and I doubt any other medical condition or other [facility] requiring further screening, evaluation, or treatment in the ED at this time prior to discharge.   Final Clinical Impression(s) / ED Diagnoses Final diagnoses:  ESRD (end stage renal disease) ([place])  Hyperkalemia    Rx / DC Orders ED Discharge Orders     None        Deno Etienne, DO 04/07/21 2237

## 2021-04-07 NOTE — Progress Notes (Signed)
Patient dialysis dependent   missed 3 weeks of dialysis [address]  last dialysis  appears 03/09/21  She was asked to come to the ER for evaluation when she presented for dialysis  today   BP  160/100   P 62   T 98.7    sats 100% room air  K 5.3   Cr 7.26   bicarb 19     Dialysis 180 NRe    T 4.15 min   K 2  Ca 2.5    Will plan dialysis   low flows to avoid dysequilibrium   case manager involvement could be helpful

## 2021-04-07 NOTE — ED Provider Notes (Signed)
Emergency Medicine Provider Triage Evaluation Note  Brooke Baldwin , a 45 y.o. female  was evaluated in triage.  Pt complains of renal failure.  Pt reports she has missed 3 weeks of dialysis.  Pt's MD advised her to come in for blood work.  Pt can not schedule dialysis until after Monday   Review of Systems  Positive: Normal urine out put Negative: No shortness of breath  Physical Exam  BP (!) 140/99 (BP Location: Right Arm)   Pulse 86   Temp 98.9 F (37.2 C) (Oral)   Resp 16   LMP 04/02/2015 (Approximate)   SpO2 100%  Gen:   Awake, no distress   Resp:  Normal effort  MSK:   Moves extremities without difficulty  Other:    Medical Decision Making  Medically screening exam initiated at 6:18 PM.  Appropriate orders placed.  Dani Wallner was informed that the remainder of the evaluation will be completed by another provider, this initial triage assessment does not replace that evaluation, and the importance of remaining in the ED until their evaluation is complete.     Fransico Meadow, PA-C 04/07/21 [place], [place], DO 04/07/21 2029

## 2021-04-08 LAB — RESP PANEL BY RT-PCR (FLU A&B, COVID) ARPGX2
Influenza A by PCR: NEGATIVE
Influenza B by PCR: NEGATIVE
SARS Coronavirus 2 by RT PCR: NEGATIVE

## 2021-05-04 ENCOUNTER — Other Ambulatory Visit: Payer: POS

## 2021-05-04 ENCOUNTER — Other Ambulatory Visit (HOSPITAL_COMMUNITY)
Admission: RE | Admit: 2021-05-04 | Discharge: 2021-05-04 | Disposition: A | Discharge status: Home / Self Care | Payer: POS | Source: Ambulatory Visit | Attending: Internal Medicine | Admitting: Internal Medicine

## 2021-05-04 ENCOUNTER — Ambulatory Visit (INDEPENDENT_AMBULATORY_CARE_PROVIDER_SITE_OTHER): Payer: POS

## 2021-05-04 ENCOUNTER — Other Ambulatory Visit: Payer: Self-pay

## 2021-05-04 ENCOUNTER — Ambulatory Visit: Payer: POS

## 2021-05-04 DIAGNOSIS — Z113 Encounter for screening for infections with a predominantly sexual mode of transmission: Secondary | ICD-10-CM | POA: Insufficient documentation

## 2021-05-04 DIAGNOSIS — Z23 Encounter for immunization: Secondary | ICD-10-CM | POA: Diagnosis not present

## 2021-05-04 DIAGNOSIS — B20 Human immunodeficiency virus [HIV] disease: Secondary | ICD-10-CM | POA: Diagnosis present

## 2021-05-04 DIAGNOSIS — Z79899 Other long term (current) drug therapy: Secondary | ICD-10-CM

## 2021-05-05 LAB — URINE CYTOLOGY ANCILLARY ONLY
Chlamydia: NEGATIVE
Comment: NEGATIVE
Comment: NORMAL
Neisseria Gonorrhea: NEGATIVE

## 2021-05-05 LAB — T-HELPER CELL (CD4) - (RCID CLINIC ONLY)
CD4 % Helper T Cell: 11 % — ABNORMAL LOW (ref 33–65)
CD4 T Cell Abs: 81 /uL — ABNORMAL LOW (ref 400–1790)

## 2021-05-07 LAB — CBC WITH DIFFERENTIAL/PLATELET
Absolute Monocytes: 475 cells/uL (ref 200–950)
Basophils Absolute: 33 cells/uL (ref 0–200)
Basophils Relative: 0.5 %
Eosinophils Absolute: 364 cells/uL (ref 15–500)
Eosinophils Relative: 5.6 %
HCT: 30.5 % — ABNORMAL LOW (ref 35.0–45.0)
Hemoglobin: 10.2 g/dL — ABNORMAL LOW (ref 11.7–15.5)
Lymphs Abs: 845 cells/uL — ABNORMAL LOW (ref 850–3900)
MCH: 33.3 pg — ABNORMAL HIGH (ref 27.0–33.0)
MCHC: 33.4 g/dL (ref 32.0–36.0)
MCV: 99.7 fL (ref 80.0–100.0)
MPV: 9.4 fL (ref 7.5–12.5)
Monocytes Relative: 7.3 %
Neutro Abs: 4784 cells/uL (ref 1500–7800)
Neutrophils Relative %: 73.6 %
Platelets: 270 10*3/uL (ref 140–400)
RBC: 3.06 10*6/uL — ABNORMAL LOW (ref 3.80–5.10)
RDW: 13.2 % (ref 11.0–15.0)
Total Lymphocyte: 13 %
WBC: 6.5 10*3/uL (ref 3.8–10.8)

## 2021-05-07 LAB — COMPLETE METABOLIC PANEL WITH GFR
AG Ratio: 1 (calc) (ref 1.0–2.5)
ALT: 11 U/L (ref 6–29)
AST: 16 U/L (ref 10–35)
Albumin: 3.3 g/dL — ABNORMAL LOW (ref 3.6–5.1)
Alkaline phosphatase (APISO): 100 U/L (ref 31–125)
BUN/Creatinine Ratio: 7 (calc) (ref 6–22)
BUN: 54 mg/dL — ABNORMAL HIGH (ref 7–25)
CO2: 24 mmol/L (ref 20–32)
Calcium: 9.3 mg/dL (ref 8.6–10.2)
Chloride: 109 mmol/L (ref 98–110)
Creat: 7.36 mg/dL — ABNORMAL HIGH (ref 0.50–0.99)
Globulin: 3.2 g/dL (calc) (ref 1.9–3.7)
Glucose, Bld: 83 mg/dL (ref 65–99)
Potassium: 4.2 mmol/L (ref 3.5–5.3)
Sodium: 144 mmol/L (ref 135–146)
Total Bilirubin: 0.3 mg/dL (ref 0.2–1.2)
Total Protein: 6.5 g/dL (ref 6.1–8.1)
eGFR: 6 mL/min/{1.73_m2} — ABNORMAL LOW (ref >=60)

## 2021-05-07 LAB — HIV-1 RNA QUANT-NO REFLEX-BLD
HIV 1 RNA Quant: NOT DETECTED Copies/mL
HIV-1 RNA Quant, Log: NOT DETECTED Log cps/mL

## 2021-05-07 LAB — RPR: RPR Ser Ql: NONREACTIVE

## 2021-05-22 ENCOUNTER — Telehealth: Payer: Self-pay | Admitting: *Deleted

## 2021-05-22 NOTE — Telephone Encounter (Signed)
Ok per Dr Berline Lopes to scheduled the patient for a new patient appt in the afternoon (patient worsk from 630 am to 3 pm)

## 2021-05-23 ENCOUNTER — Other Ambulatory Visit: Payer: Self-pay

## 2021-05-23 ENCOUNTER — Encounter: Payer: Self-pay | Admitting: Internal Medicine

## 2021-05-23 ENCOUNTER — Ambulatory Visit (INDEPENDENT_AMBULATORY_CARE_PROVIDER_SITE_OTHER): Payer: POS | Admitting: Internal Medicine

## 2021-05-23 DIAGNOSIS — Z298 Encounter for other specified prophylactic measures: Secondary | ICD-10-CM | POA: Diagnosis not present

## 2021-05-23 DIAGNOSIS — Z7185 Encounter for immunization safety counseling: Secondary | ICD-10-CM

## 2021-05-23 DIAGNOSIS — B20 Human immunodeficiency virus [HIV] disease: Secondary | ICD-10-CM | POA: Diagnosis not present

## 2021-05-23 MED ORDER — ATOVAQUONE 750 MG/5ML PO SUSP: 1500.0000 mg | Freq: Every day | ORAL | 5 refills | Status: DC

## 2021-05-23 MED ORDER — BIKTARVY 50-200-25 MG PO TABS: 1.0000 | ORAL_TABLET | Freq: Every day | ORAL | 5 refills | Status: DC

## 2021-05-23 NOTE — Assessment & Plan Note (Signed)
CD4 count remains less than 200.   Discussed need to continue with atovaquone for PCP PPx.  Refills sent today.

## 2021-05-23 NOTE — Assessment & Plan Note (Signed)
Doing well on Biktarvy and recent viral load undetectable.  Will continue with Biktarvy.  Refills sent today.

## 2021-05-23 NOTE — Assessment & Plan Note (Signed)
She recently received her Covid booster and flu shot.

## 2021-05-23 NOTE — Progress Notes (Signed)
[facility] for Infectious Disease   CHIEF COMPLAINT    HIV follow up.    SUBJECTIVE:    Brooke Baldwin is a 45 y.o. female with PMHx as below who presents to the clinic for HIV follow up.   She is currently on Biktarvy.  She has not been taking Atovaquone for PCP PPx.  She was last seen for a video visit on 01/05/21.  Her most recent labs are from 05/04/21 with undetectable viral load and CD4 count 81 (11%).  She received her flu shot and bivalent Covid booster last month.  Please see A&P for the details of today's visit and status of the patient's medical problems.   Patient's Medications  New Prescriptions   No medications on file  Previous Medications   ATOVAQUONE (MEPRON) 750 MG/5ML SUSPENSION    Take 10 mLs (1,500 mg total) by mouth daily.   AURYXIA 1 GM 210 MG(FE) TABLET    SMARTSIG:2 Tablet(s) By Mouth 5 Times Daily   BICTEGRAVIR-EMTRICITABINE-TENOFOVIR AF (BIKTARVY) 50-200-25 MG TABS TABLET    Take 1 tablet by mouth daily.   MULTIVITAMIN (RENA-VIT) TABS TABLET    Take 1 tablet by mouth at bedtime.   TRIAMCINOLONE (KENALOG) 0.1 %    Apply topically 2 (two) times daily as needed.  Modified Medications   No medications on file  Discontinued Medications   No medications on file      Past Medical History:  Diagnosis Date   Anemia associated with chronic renal failure    Atrophic kidney    bilateral   CIN I (cervical intraepithelial neoplasia I)    ESRD (end stage renal disease) on dialysis ([place]) 05/2020   hemodialysis  T/ Th/ Sat  @ [facility] ([address] in [place], ( 10-17-2020  s/p left AVG 11/ 2021 unable to use as of yet, currently using right IJ tunneled cath (right side of upper chest) for dialysis   FSGS (focal segmental glomerulosclerosis)    renal biopsy 04-15-2020 in epic , secondary to HTN/ HIV/ Obese   Heart palpitations 11/ 2021  (10-17-2020  pt stated the palpitations does not bother her and has no other symptoms , stated they started 11/ 2021)    pt was been seen by cardiology, dr s. Patria Mane ([facility] in [place]),  per pt had event monitor for 17 days , was told normal with  no arrhythmias,  and stated echo is scheduled for 10-28-2020   History of vulvar dysplasia    HIV (human immunodeficiency virus infection) ([place])    followed by ID-- dr a. Chenae Brager   Hypertension    S/P arteriovenous (AV) fistula creation 07-04-2020 @[facility]    left forearm,  07-28-2020  angioplasty of thrombosis   Secondary hyperparathyroidism of renal origin ([place])    Tachycardia    Trigeminal neuralgia    ride side ---  neurologist--- dr Krista Blue   (10-17-2020  pt stated last episode about 4 months ago was mild)   VIN II (vulvar intraepithelial neoplasia II)     Social History   Tobacco Use   Smoking status: Never   Smokeless tobacco: Never  Vaping Use   Vaping Use: Never used  Substance Use Topics   Alcohol use: Not Currently   Drug use: Not Currently    Family History  Problem Relation Age of Onset   Diabetes Mother    Healthy Father    Breast cancer Maternal Aunt    Pancreatic cancer Maternal Grandfather  Colon cancer Neg Hx    Colon polyps Neg Hx    Esophageal cancer Neg Hx    Rectal cancer Neg Hx    Stomach cancer Neg Hx    Prostate cancer Neg Hx    Endometrial cancer Neg Hx    Ovarian cancer Neg Hx     Allergies  Allergen Reactions   Baclofen Other (See Comments)    Caused intolerable dizziness    Gabapentin Other (See Comments)    Caused intolerable dizziness   Penicillins Other (See Comments)    Unknown childhood reaction     Review of Systems  Constitutional: Negative.   Respiratory: Negative.    Cardiovascular: Negative.   Gastrointestinal: Negative.     OBJECTIVE:      Labs and Microbiology: CMP Latest Ref Rng & Units 05/04/2021 04/07/2021 12/22/2020  Glucose 65 - 99 mg/dL 83 86 82  BUN 7 - 25 mg/dL 54(H) 69(H) 96(H)  Creatinine 0.50 - 0.99 mg/dL 7.36(H) 7.26(H) 10.60(H)  Sodium 135 - 146 mmol/L 144 140 137   Potassium 3.5 - 5.3 mmol/L 4.2 5.3(H) 4.8  Chloride 98 - 110 mmol/L 109 115(H) 109  CO2 20 - 32 mmol/L 24 19(L) 17(L)  Calcium 8.6 - 10.2 mg/dL 9.3 9.3 9.6  Total Protein 6.1 - 8.1 g/dL 6.5 - -  Total Bilirubin 0.2 - 1.2 mg/dL 0.3 - -  Alkaline Phos 38 - 126 U/L - - -  AST 10 - 35 U/L 16 - -  ALT 6 - 29 U/L 11 - -   CBC Latest Ref Rng & Units 05/04/2021 04/07/2021 12/22/2020  WBC 3.8 - 10.8 Thousand/uL 6.5 6.8 3.5(L)  Hemoglobin 11.7 - 15.5 g/dL 10.2(L) 12.0 11.1(L)  Hematocrit 35.0 - 45.0 % 30.5(L) 37.4 35.3(L)  Platelets 140 - 400 Thousand/uL 270 283 294     Lab Results  Component Value Date   HIV1RNAQUANT Not Detected 05/04/2021   HIV1RNAQUANT 53 (H) 12/21/2020   HIV1RNAQUANT 1,730 (H) 08/19/2020   CD4TABS 81 (L) 05/04/2021   CD4TABS 42 (L) 12/21/2020   CD4TABS <35 (L) 07/01/2020    RPR and STI: Lab Results  Component Value Date   LABRPR NON-REACTIVE 05/04/2021   LABRPR NON REACTIVE 07/03/2020    STI Results GC CT  05/04/2021 Negative Negative    Hepatitis B: Lab Results  Component Value Date   HEPBSAG NON REACTIVE 07/03/2020   HEPBCAB NON REACTIVE 07/03/2020   Hepatitis C: No results found for: HEPCAB, HCVRNAPCRQN Hepatitis A: Lab Results  Component Value Date   HAV NON REACTIVE 07/03/2020   Lipids: Lab Results  Component Value Date   CHOL 159 08/19/2020   TRIG 73 08/19/2020   HDL 71 08/19/2020   CHOLHDL 2.2 08/19/2020   [place] 73 08/19/2020      ASSESSMENT & PLAN:    HIV (human immunodeficiency virus infection) ([place]) Doing well on Biktarvy and recent viral load undetectable.  Will continue with Biktarvy.  Refills sent today.  Need for pneumocystis prophylaxis CD4 count remains less than 200.   Discussed need to continue with atovaquone for PCP PPx.  Refills sent today.  Vaccine counseling She recently received her Covid booster and flu shot.     Rayn[facility] [place]  [facility] 05/23/2021, 9:51 AM         Virtual Visit via Telephone Note   I connected with Brooke Baldwin on 05/23/21 at 9:51 AM by telephone and verified that I am speaking with  the correct person using two identifiers.   I discussed the limitations, risks, security and privacy concerns of performing an evaluation and management service by telephone and the availability of in person appointments. I also discussed with the patient that there may be a patient responsible charge related to this service. The patient expressed understanding and agreed to proceed.  Patient location: home My location: [facility] Duration of call and visit time in chart: 20 min

## 2021-05-30 ENCOUNTER — Encounter: Payer: Self-pay | Admitting: Gynecologic Oncology

## 2021-05-31 ENCOUNTER — Encounter: Payer: Self-pay | Admitting: *Deleted

## 2021-05-31 ENCOUNTER — Other Ambulatory Visit: Payer: Self-pay

## 2021-05-31 ENCOUNTER — Inpatient Hospital Stay: Payer: POS | Attending: Gynecologic Oncology | Admitting: Gynecologic Oncology

## 2021-05-31 VITALS — BP 113/76 | HR 65 | Temp 98.6°F | Resp 17 | Wt 176.2 lb

## 2021-05-31 DIAGNOSIS — Z992 Dependence on renal dialysis: Secondary | ICD-10-CM | POA: Insufficient documentation

## 2021-05-31 DIAGNOSIS — Z87412 Personal history of vulvar dysplasia: Secondary | ICD-10-CM | POA: Diagnosis not present

## 2021-05-31 DIAGNOSIS — N186 End stage renal disease: Secondary | ICD-10-CM | POA: Insufficient documentation

## 2021-05-31 DIAGNOSIS — Z87410 Personal history of cervical dysplasia: Secondary | ICD-10-CM | POA: Insufficient documentation

## 2021-05-31 DIAGNOSIS — Z21 Asymptomatic human immunodeficiency virus [HIV] infection status: Secondary | ICD-10-CM | POA: Diagnosis not present

## 2021-05-31 DIAGNOSIS — D398 Neoplasm of uncertain behavior of other specified female genital organs: Secondary | ICD-10-CM | POA: Insufficient documentation

## 2021-05-31 DIAGNOSIS — N9489 Other specified conditions associated with female genital organs and menstrual cycle: Secondary | ICD-10-CM | POA: Insufficient documentation

## 2021-05-31 NOTE — Patient Instructions (Signed)
It was a pleasure meeting you today. I will call you once we have the MRI results.  Based on MRI images, if this looks more like a fibroid, then I will speak with your gynecologist about her preference to do the surgery. If it appears to be a mass coming from an ovary, then we will discuss getting surgery scheduled with me and work on clearance from your PCP, kidney doctor and infectious disease doctor.

## 2021-05-31 NOTE — Progress Notes (Signed)
Gynecologic Oncology Return Clinic Visit  05/31/2021  Reason for Visit: Adnexal mass  Treatment History: Seen in our clinic previously (most recently as of 01/2021) for history of vulvar and cervical dysplasia.   Interval History: The patient has been rereferred to our office in the setting of a complex adnexal mass.    The patient was taken to the operating room on 9/12 for diagnostic laparoscopy with plan for peritoneal dialysis catheter placement.  This was to be done in the setting of chronic renal failure.  Findings at the time of diagnostic laparoscopy were a 20 x 20 cm pelvic mass that was described as hard and nodular, either massive fibroid or ovarian in origin.  An OB/GYN was called for an intraoperative consult who recommended against biopsy.  Recommendation was for formal evaluation with pelvic ultrasound and/or CT scan.    Pelvic ultrasound was performed on 9/26 at [facility] showing a uterus measuring 6.6 x 1.5 x 3.4 cm.  Several fibroids were noticed, the largest measuring 3.1 x 3.2 cm.  Left ovary seen and within normal limits.  Right ovary measures 2.5 x 2 x 2.6 cm.  Within the right adnexa, a large, hypoechoic solid mass with multiple echogenic areas within is noted.  Unable to appreciate whether this is attached to the ovary or adjacent to it.  Mass measures 7.9 x 8.3 x 5.8 cm.  It is stable in size from her last ultrasound.  May be pedunculated fibroid versus adnexal mass.  Additionally, mass is stable in size since January of this year when it was initially seen.  Patient reports today that she is overall feeling very good.  She denies any abdominal or pelvic pain/pressure.  She endorses normal bowel function.  She describes increased urinary frequency but notes that this is her baseline and has not worsened recently.  She endorses a good appetite without nausea or emesis.  She endorses some early satiety since November of last year when she got sick.  She notes never eating  very much historically though.  She is unsure whether she has had prior ultrasounds before this year.  Patient was previously living in [place] and established with Dr. Landry Mellow for her GYN care when she moved to [facility].  Her history is also notable for renal failure, dialysis dependent.  She sees Dr. Carmina Miller, nephrologist at her dialysis center on [facility].  She gets dialysis on Tuesday, Thursday, Saturday.  She is currently getting hemodialysis but still is very interested in peritoneal dialysis.  Having to go to dialysis interferes with her work (patient works at the [employer]).  Patient is also HIV positive.  She follows with infectious disease.  She last saw Dr. Juleen China on 10/18.  She is on Biktarvy, most recent labs show undetectable viral load and CD4 count of 81.  Past Medical/Surgical History: Past Medical History:  Diagnosis Date   Anemia associated with chronic renal failure    Atrophic kidney    bilateral   CIN I (cervical intraepithelial neoplasia I)    ESRD (end stage renal disease) on dialysis ([place]) 05/2020   hemodialysis  T/ Th/ Sat  @ [facility] ([address] in [place], ( 10-17-2020  s/p left AVG 11/ 2021 unable to use as of yet, currently using right IJ tunneled cath (right side of upper chest) for dialysis   FSGS (focal segmental glomerulosclerosis)    renal biopsy 04-15-2020 in epic , secondary to HTN/ HIV/ Obese   Heart palpitations 11/ 2021  (  10-17-2020  pt stated the palpitations does not bother her and has no other symptoms , stated they started 11/ 2021)   pt was been seen by cardiology, dr s. Patria Mane ([facility] in [place]),  per pt had event monitor for 17 days , was told normal with  no arrhythmias,  and stated echo is scheduled for 10-28-2020   History of vulvar dysplasia    HIV (human immunodeficiency virus infection) ([place])    followed by ID-- dr a. wallace   Hypertension    S/P arteriovenous (AV) fistula creation 07-04-2020 @[facility]    left forearm,   07-28-2020  angioplasty of thrombosis   Secondary hyperparathyroidism of renal origin ([place])    Tachycardia    Trigeminal neuralgia    ride side ---  neurologist--- dr Krista Blue   (10-17-2020  pt stated last episode about 4 months ago was mild)   VIN II (vulvar intraepithelial neoplasia II)     Past Surgical History:  Procedure Laterality Date   A/V FISTULAGRAM Left 07/28/2020   Procedure: A/V FISTULAGRAM;  Surgeon: Marty Heck, MD;  Location: [facility];  Service: Cardiovascular;  Laterality: Left;   AV FISTULA PLACEMENT Left 07/04/2020   Procedure: INSERTION OF LEFT ARTERIOVENOUS (AV) GORE-TEX GRAFT ARM (ACUSEAL);  Surgeon: Cherre Robins, MD;  Location: [facility];  Service: Vascular;  Laterality: Left;   CERVICAL BIOPSY  W/ LOOP ELECTRODE EXCISION  1610   CO2 LASER APPLICATION N/A 9/60/4540   Procedure: CO2 LASER APPLICATION;  Surgeon: Everitt Amber, MD;  Location: [facility];  Service: Gynecology;  Laterality: N/A;   ORIF ANKLE FRACTURE Right 2012   per pt retained hardware   PERIPHERAL VASCULAR BALLOON ANGIOPLASTY Left 07/28/2020   Procedure: PERIPHERAL VASCULAR BALLOON ANGIOPLASTY;  Surgeon: Marty Heck, MD;  Location: [facility];  Service: Cardiovascular;  Laterality: Left;  AVF   VULVA SURGERY  2013   excision    Family History  Problem Relation Age of Onset   Diabetes Mother    Healthy Father    Breast cancer Maternal Aunt    Pancreatic cancer Maternal Grandfather    Colon cancer Neg Hx    Colon polyps Neg Hx    Esophageal cancer Neg Hx    Rectal cancer Neg Hx    Stomach cancer Neg Hx    Prostate cancer Neg Hx    Endometrial cancer Neg Hx    Ovarian cancer Neg Hx     Social History   Socioeconomic History   Marital status: Single    Spouse name: Not on file   Number of children: 0   Years of education: college   Highest education level: Bachelor's degree (e.g., BA, AB, BS)  Occupational History   Occupation: Buyer, retail  Tobacco Use   Smoking status: Never   Smokeless tobacco: Never  Vaping Use   Vaping Use: Never used  Substance and Sexual Activity   Alcohol use: Not Currently   Drug use: Not Currently   Sexual activity: Not Currently    Birth control/protection: Post-menopausal  Other Topics Concern   Not on file  Social History Narrative   Lives at home with significant other.   Right-handed.   No daily caffeine use.   Social Determinants of Health   Financial Resource Strain: Not on file  Food Insecurity: Not on file  Transportation Needs: Not on file  Physical Activity: Not on file  Stress: Not on file  Social Connections: Not on file  Current Medications:  Current Outpatient Medications:    atovaquone (MEPRON) 750 MG/5ML suspension, Take 10 mLs (1,500 mg total) by mouth daily., Disp: 210 mL, Rfl: 5   AURYXIA 1 GM 210 MG(Fe) tablet, SMARTSIG:2 Tablet(s) By Mouth 5 Times Daily, Disp: , Rfl:    bictegravir-emtricitabine-tenofovir AF (BIKTARVY) 50-200-25 MG TABS tablet, Take 1 tablet by mouth daily., Disp: 30 tablet, Rfl: 5   multivitamin (RENA-VIT) TABS tablet, Take 1 tablet by mouth at bedtime., Disp: , Rfl:    triamcinolone (KENALOG) 0.1 %, Apply topically 2 (two) times daily as needed. (Patient not taking: Reported on 05/30/2021), Disp: , Rfl:   Review of Systems: Denies appetite changes, fevers, chills, fatigue, unexplained weight changes. Denies hearing loss, neck lumps or masses, mouth sores, ringing in ears or voice changes. Denies cough or wheezing.  Denies shortness of breath. Denies chest pain or palpitations. Denies leg swelling. Denies abdominal distention, pain, blood in stools, constipation, diarrhea, nausea, vomiting, or early satiety. Denies pain with intercourse, dysuria, frequency, hematuria or incontinence. Denies hot flashes, pelvic pain, vaginal bleeding or vaginal discharge.   Denies joint pain, back pain or muscle pain/cramps. Denies itching, rash, or  wounds. Denies dizziness, headaches, numbness or seizures. Denies swollen lymph nodes or glands, denies easy bruising or bleeding. Denies anxiety, depression, confusion, or decreased concentration.  Physical Exam: BP 113/76   Pulse 65   Temp 98.6 F (37 C)   Resp 17   Wt 176 lb 4 oz (79.9 kg)   LMP 04/02/2015 (Approximate)   SpO2 100%   BMI 34.42 kg/m  General: Alert, oriented, no acute distress. HEENT: Normocephalic, atraumatic, sclera anicteric. Chest: Clear to auscultation bilaterally.  No wheezes or rhonchi. Cardiovascular: Regular rate and rhythm, no murmurs. Abdomen: Obese, soft, nontender.  Normoactive bowel sounds.  Firm and mobile mass appreciated below the umbilicus and the anterior pelvis.  No hepatosplenomegaly appreciated.  Well-healed laparoscopic incisions. Extremities: Grossly normal range of motion.  Warm, well perfused.  No edema bilaterally. Lymphatics: No cervical, supraclavicular, or inguinal adenopathy. GU: Normal appearing external genitalia without erythema, excoriation, or lesions.  Speculum exam reveals well rugated vaginal mucosa, no lesions or masses.  Cervix is almost completely flush with the vagina, no lesions.  Bimanual exam reveals mobile nodular mass that is quite anterior appreciated just above the bladder measuring approximately 10-12 cm.  It is difficult to tell if this is the uterus or the mass.  I feel a second mass within the cul-de-sac, better on abdominal exam that I actually suspect is her uterus.   Laboratory & Radiologic Studies: None new  Assessment & Plan: Brooke Baldwin is a 45 y.o. woman with solid-appearing adnexal mass, suspicious either for a solid ovarian tumor versus uterine fibroid.  Discussed ultrasound findings with the patient, although I am unable to see these as they were outside of our system.  Given reports, the mass has been stable in size and character for almost 11 months.  Description of the mass from surgery is  quite different on the ultrasound.  Estimation at surgery was that the mass was twice as large as it is been on ultrasound and looked concerning.  Unfortunately, the patient forgot to bring pictures from surgery and I am unable to see these in care everywhere.  I have asked her to drop them off at clinic if she is nearby in the next week or 2.  I recommend moving forward with pelvic MRI to better characterize the mass.  I think ultimately she  is likely going to need surgery.  I am hopeful that the MRI will help establish who should do the surgery and whether it is worth trying to coordinate the surgery at the same time as peritoneal dialysis catheter gets placed.  My suspicion is that her surgeon will want to wait and have this adnexal mass removed prior to attempt at dialysis catheter placement.  Once MRI results are available, I will call the patient to discuss further.  If there is concern that the masses ovarian in origin or there are features that raise the concern for malignancy, then we will plan to move forward with scheduling surgery with me.  Patient will need clearance from nephrology, her PCP, and infectious disease.  Plan would be for most minimally invasive procedure possible with shortness time under anesthesia.  I would plan to remove the mass robotically and, unless hysterectomy was necessary, a mini laparotomy would be performed for specimen retrieval.  42 minutes of total time was spent for this patient encounter, including preparation, face-to-face counseling with the patient and coordination of care, and documentation of the encounter.  Jeral Pinch, MD  Division of Gynecologic Oncology  Department of Obstetrics and Gynecology  [facility]

## 2021-06-03 ENCOUNTER — Ambulatory Visit (HOSPITAL_COMMUNITY): Payer: POS

## 2021-06-12 ENCOUNTER — Other Ambulatory Visit: Payer: Self-pay

## 2021-06-12 ENCOUNTER — Ambulatory Visit (HOSPITAL_COMMUNITY)
Admission: RE | Admit: 2021-06-12 | Discharge: 2021-06-12 | Disposition: A | Discharge status: Home / Self Care | Payer: POS | Source: Ambulatory Visit | Attending: Gynecologic Oncology | Admitting: Gynecologic Oncology

## 2021-06-12 DIAGNOSIS — N9489 Other specified conditions associated with female genital organs and menstrual cycle: Secondary | ICD-10-CM | POA: Diagnosis present

## 2021-06-20 ENCOUNTER — Telehealth: Payer: Self-pay | Admitting: Gynecologic Oncology

## 2021-06-20 NOTE — Telephone Encounter (Signed)
Called patient - left message letting her know I spoke with her gyn about MRI findings. GYN was going to reach out to her to discuss scheduling surgery. Callback requested.  Jeral Pinch MD Gynecologic Oncology

## 2021-07-21 ENCOUNTER — Encounter (HOSPITAL_COMMUNITY): Payer: Self-pay

## 2021-07-21 ENCOUNTER — Ambulatory Visit (HOSPITAL_COMMUNITY)
Admission: EM | Admit: 2021-07-21 | Discharge: 2021-07-21 | Disposition: A | Discharge status: Home / Self Care | Payer: POS | Attending: Emergency Medicine | Admitting: Emergency Medicine

## 2021-07-21 ENCOUNTER — Other Ambulatory Visit: Payer: Self-pay

## 2021-07-21 DIAGNOSIS — H109 Unspecified conjunctivitis: Secondary | ICD-10-CM | POA: Insufficient documentation

## 2021-07-21 DIAGNOSIS — R102 Pelvic and perineal pain: Secondary | ICD-10-CM | POA: Insufficient documentation

## 2021-07-21 LAB — POCT URINALYSIS DIPSTICK, ED / UC
Bilirubin Urine: NEGATIVE
Glucose, UA: 100 mg/dL — AB
Ketones, ur: NEGATIVE mg/dL
Leukocytes,Ua: NEGATIVE
Nitrite: NEGATIVE
Protein, ur: >=300 mg/dL — AB
Specific Gravity, Urine: 1.02 (ref 1.005–1.030)
Urobilinogen, UA: 0.2 mg/dL (ref 0.0–1.0)
pH: 7 (ref 5.0–8.0)

## 2021-07-21 MED ORDER — POLYMYXIN B-TRIMETHOPRIM 10000-0.1 UNIT/ML-% OP SOLN: 1.0000 [drp] | OPHTHALMIC | 0 refills | Status: DC

## 2021-07-21 MED ORDER — ALUMINUM-MAGNESIUM-SIMETHICONE 200-200-20 MG/5ML PO SUSP: 30.0000 mL | Freq: Three times a day (TID) | ORAL | 0 refills | Status: DC

## 2021-07-21 NOTE — ED Triage Notes (Signed)
Pt present s with c/o pressure in abdomen when urinating and pink eye x 4 days.

## 2021-07-21 NOTE — Discharge Instructions (Addendum)
Today you being treated for bacterial conjunctivitis.   Place one drop of polytrim into the effected eye every 4 hours while awake for 7 days. If the other eye starts to have symptoms you may use medication in it as well. Do not allow tip of dropper to touch eye. May use cool compress for comfort and to remove discharge if present. Pat the eye, do not wipe.  Do not rub eyes, this may cause more irritation.  May use benadryl as needed to help if itching present.  If symptoms persist after use of medication, please follow up at Urgent Care or with ophthalmologist (eye doctor)   Your urinalysis did not show bacteria or Allyna Pittsley blood cells, we will send it to the lab to see if it grows bacteria, if this occurs you will be notified and antibiotics sent in for use   In the meantime we will try to treat and reduce any possible gas, you may use Maalox 4 times a day ideally before you eat food, this medicine will help to reduce any gas and protect your stomach from acid, at any point if your abdominal pain becomes more severe please follow-up at the nearest emergency department for further evaluation

## 2021-07-21 NOTE — ED Provider Notes (Signed)
[facility]    CSN: [id number] Arrival date & time: 07/21/21  1940      History   Chief Complaint Chief Complaint  Patient presents with   Abdominal Pain   Conjunctivitis    HPI Brooke Baldwin is a 45 y.o. female.   Patient presents with suprapubic abdominal pain for 4 days.  Pain is described as pressure and initially was radiating to the left and right lower quadrant.  pain has become constant in the suprapubic region over the last day.  Intially though she had gas and was constipated, therefore she attempted use of Dulcolax, MiraLAX.  LBM 1 day ago after use of medications.  Endorses that pain persisted after bowel movement.  Denies nausea, vomiting, diarrhea, heartburn or indigestion, changes in diet, recent travel.  No known sick contacts.  History of end-stage renal disease, anemia.   Patient concerned with left eye itching, redness , drainage and crusting 4 days.  Has not attempted treatment.  Does not wear contacts or glasses.  Denies visual disturbance, floaters, light sensitivity.  Past Medical History:  Diagnosis Date   Anemia associated with chronic renal failure    Atrophic kidney    bilateral   CIN I (cervical intraepithelial neoplasia I)    ESRD (end stage renal disease) on dialysis ([place]) 05/2020   hemodialysis  T/ Th/ Sat  @ [facility] ([address] in [place], ( 10-17-2020  s/p left AVG 11/ 2021 unable to use as of yet, currently using right IJ tunneled cath (right side of upper chest) for dialysis   FSGS (focal segmental glomerulosclerosis)    renal biopsy 04-15-2020 in epic , secondary to HTN/ HIV/ Obese   Heart palpitations 11/ 2021  (10-17-2020  pt stated the palpitations does not bother her and has no other symptoms , stated they started 11/ 2021)   pt was been seen by cardiology, dr s. Patria Mane ([facility] in [place]),  per pt had event monitor for 17 days , was told normal with  no arrhythmias,  and stated echo is scheduled for 10-28-2020    History of vulvar dysplasia    HIV (human immunodeficiency virus infection) ([place])    followed by ID-- dr a. wallace   Hypertension    S/P arteriovenous (AV) fistula creation 07-04-2020 @[facility]    left forearm,  07-28-2020  angioplasty of thrombosis   Secondary hyperparathyroidism of renal origin ([place])    Tachycardia    Trigeminal neuralgia    ride side ---  neurologist--- dr Krista Blue   (10-17-2020  pt stated last episode about 4 months ago was mild)   VIN II (vulvar intraepithelial neoplasia II)     Patient Active Problem List   Diagnosis Date Noted   Adnexal mass 05/31/2021   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   Need for hepatitis B booster vaccination 09/30/2020   Need for pneumococcal vaccination 09/30/2020   Need for COVID-19 vaccine 09/30/2020   Palpitations 09/08/2020   Class 1 obesity due to excess calories with serious comorbidity and body mass index (BMI) of 31.0 to 31.9 in adult 09/08/2020   Primary hypertension 09/08/2020   Healthcare maintenance 08/19/2020   Need for pneumocystis prophylaxis 07/27/2020   Vaccine counseling 07/27/2020   Hypoalbuminemia 07/01/2020   Secondary hyperparathyroidism ([place]) 07/01/2020   Neutropenia ([place]) 07/01/2020   Thrombocytopenia ([place]) 07/01/2020   HIV (human immunodeficiency virus infection) ([place]) 06/30/2020   End stage renal disease ([place]) 06/30/2020   Elevated blood pressure reading without diagnosis of hypertension  06/30/2020   Right trigeminal neuralgia 12/09/2019    Past Surgical History:  Procedure Laterality Date   A/V FISTULAGRAM Left 07/28/2020   Procedure: A/V FISTULAGRAM;  Surgeon: Marty Heck, MD;  Location: [facility];  Service: Cardiovascular;  Laterality: Left;   AV FISTULA PLACEMENT Left 07/04/2020   Procedure: INSERTION OF LEFT ARTERIOVENOUS (AV) GORE-TEX GRAFT ARM (ACUSEAL);  Surgeon: Cherre Robins, MD;  Location: [facility];  Service: Vascular;  Laterality: Left;   CERVICAL BIOPSY  W/  LOOP ELECTRODE EXCISION  6378   CO2 LASER APPLICATION N/A 5/88/5027   Procedure: CO2 LASER APPLICATION;  Surgeon: Everitt Amber, MD;  Location: [facility];  Service: Gynecology;  Laterality: N/A;   ORIF ANKLE FRACTURE Right 2012   per pt retained hardware   PERIPHERAL VASCULAR BALLOON ANGIOPLASTY Left 07/28/2020   Procedure: PERIPHERAL VASCULAR BALLOON ANGIOPLASTY;  Surgeon: Marty Heck, MD;  Location: [facility];  Service: Cardiovascular;  Laterality: Left;  AVF   VULVA SURGERY  2013   excision    OB History   No obstetric history on file.      Home Medications    Prior to Admission medications   Medication Sig Start Date End Date Taking? Authorizing Provider  atovaquone (MEPRON) 750 MG/5ML suspension Take 10 mLs (1,500 mg total) by mouth daily. 05/23/21   Mignon Pine, DO  AURYXIA 1 GM 210 MG(Fe) tablet SMARTSIG:2 Tablet(s) By Mouth 5 Times Daily 11/30/20   [provider]  bictegravir-emtricitabine-tenofovir AF (BIKTARVY) 50-200-25 MG TABS tablet Take 1 tablet by mouth daily. 05/23/21   Mignon Pine, DO  multivitamin (RENA-VIT) TABS tablet Take 1 tablet by mouth at bedtime.    [provider]  triamcinolone (KENALOG) 0.1 % Apply topically 2 (two) times daily as needed. Patient not taking: Reported on 05/30/2021 10/06/20   [provider]    Family History Family History  Problem Relation Age of Onset   Diabetes Mother    Healthy Father    Breast cancer Maternal Aunt    Pancreatic cancer Maternal Grandfather    Colon cancer Neg Hx    Colon polyps Neg Hx    Esophageal cancer Neg Hx    Rectal cancer Neg Hx    Stomach cancer Neg Hx    Prostate cancer Neg Hx    Endometrial cancer Neg Hx    Ovarian cancer Neg Hx     Social History Social History   Tobacco Use   Smoking status: Never   Smokeless tobacco: Never  Vaping Use   Vaping Use: Never used  Substance Use Topics   Alcohol use: Not Currently    Drug use: Not Currently     Allergies   Baclofen, Gabapentin, and Penicillins   Review of Systems Review of Systems Deferred HPI   Physical Exam Triage Vital Signs ED Triage Vitals   Enc Vitals Group     BP (!) 166/118     Pulse Rate 100     Resp 19     Temp (!) 97.5 F (36.4 C)     Temp Source Oral     SpO2 100 %     Weight      Height      Head Circumference      Peak Flow      Pain Score 7     Pain Loc      Pain Edu?      Excl. in [place]?  No data found.  Updated Vital Signs BP (!) 166/118 (BP Location: Right Arm)    Pulse 100    Temp (!) 97.5 F (36.4 C) (Oral)    Resp 19    LMP 04/02/2015 (Approximate)    SpO2 100%   Visual Acuity Right Eye Distance:   Left Eye Distance:   Bilateral Distance:    Right Eye Near:   Left Eye Near:    Bilateral Near:     Physical Exam Constitutional:      Appearance: Normal appearance. She is well-developed and normal weight.  HENT:     Head: Normocephalic.  Eyes:     Comments: Redness of the left conjunctiva, no swelling noted no drainage noted, visual acuity intact, extraocular movements intact  Pulmonary:     Effort: Pulmonary effort is normal.  Abdominal:     General: Abdomen is flat. Bowel sounds are normal.     Palpations: Abdomen is soft.     Tenderness: There is abdominal tenderness in the suprapubic area. There is no right CVA tenderness or guarding.  Skin:    General: Skin is warm and dry.  Neurological:     Mental Status: She is alert and oriented to person, place, and time. Mental status is at baseline.  Psychiatric:        Mood and Affect: Mood normal.        Behavior: Behavior normal.     UC Treatments / Results  Labs (all labs ordered are listed, but only abnormal results are displayed) Labs Reviewed  POCT URINALYSIS DIPSTICK, ED / UC - Abnormal; Notable for the following components:      Result Value   Glucose, UA 100 (*)    Hgb urine dipstick TRACE (*)    Protein, ur >=300 (*)    All  other components within normal limits  POCT URINALYSIS DIPSTICK, ED / UC    EKG   Radiology No results found.  Procedures Procedures (including critical care time)  Medications Ordered in UC Medications - No data to display  Initial Impression / Assessment and Plan / UC Course  I have reviewed the triage vital signs and the nursing notes.  Pertinent labs & imaging results that were available during my care of the patient were reviewed by me and considered in my medical decision making (see chart for details).  Bacterial conjunctivitis of the left eye Suprapubic abdominal pain  Vital signs are stable, patient in no signs of distress, tenderness seen on abdominal exam however we will at this time manage conservatively with strict precautions that increase in severity of pain to go to the nearest emergency department for further evaluation, patient in agreement with plan of care, prescribed Maalox to be used.  Polytrim prescribed for conjunctivitis, discussed administration.  Advised cool compresses for comfort, oral antihistamines as needed for itching, discontinuation of make-up if used until symptoms resolve, urgent care or ophthalmology follow-up as needed for persistent symptoms Final Clinical Impressions(s) / UC Diagnoses   Final diagnoses:  None   Discharge Instructions   None    ED Prescriptions   None    PDMP not reviewed this encounter.   Hans Eden, [place] 07/23/21 2130

## 2021-07-23 LAB — URINE CULTURE: Culture: <10000 — AB

## 2021-08-12 ENCOUNTER — Emergency Department (HOSPITAL_COMMUNITY): Payer: POS

## 2021-08-12 ENCOUNTER — Observation Stay (HOSPITAL_COMMUNITY)
Admission: EM | Admit: 2021-08-12 | Discharge: 2021-08-15 | Disposition: A | Discharge status: Home / Self Care | Payer: POS | Attending: Internal Medicine | Admitting: Internal Medicine

## 2021-08-12 DIAGNOSIS — N2581 Secondary hyperparathyroidism of renal origin: Secondary | ICD-10-CM | POA: Diagnosis not present

## 2021-08-12 DIAGNOSIS — N186 End stage renal disease: Secondary | ICD-10-CM | POA: Diagnosis not present

## 2021-08-12 DIAGNOSIS — Y712 Prosthetic and other implants, materials and accessory cardiovascular devices associated with adverse incidents: Secondary | ICD-10-CM | POA: Insufficient documentation

## 2021-08-12 DIAGNOSIS — R051 Acute cough: Secondary | ICD-10-CM | POA: Diagnosis not present

## 2021-08-12 DIAGNOSIS — E875 Hyperkalemia: Secondary | ICD-10-CM | POA: Diagnosis not present

## 2021-08-12 DIAGNOSIS — Z20822 Contact with and (suspected) exposure to covid-19: Secondary | ICD-10-CM | POA: Diagnosis not present

## 2021-08-12 DIAGNOSIS — R062 Wheezing: Secondary | ICD-10-CM | POA: Insufficient documentation

## 2021-08-12 DIAGNOSIS — Z992 Dependence on renal dialysis: Secondary | ICD-10-CM | POA: Insufficient documentation

## 2021-08-12 DIAGNOSIS — B20 Human immunodeficiency virus [HIV] disease: Secondary | ICD-10-CM | POA: Insufficient documentation

## 2021-08-12 DIAGNOSIS — I120 Hypertensive chronic kidney disease with stage 5 chronic kidney disease or end stage renal disease: Secondary | ICD-10-CM | POA: Diagnosis not present

## 2021-08-12 DIAGNOSIS — D631 Anemia in chronic kidney disease: Secondary | ICD-10-CM | POA: Insufficient documentation

## 2021-08-12 DIAGNOSIS — R609 Edema, unspecified: Secondary | ICD-10-CM

## 2021-08-12 DIAGNOSIS — T8241XA Breakdown (mechanical) of vascular dialysis catheter, initial encounter: Secondary | ICD-10-CM | POA: Diagnosis not present

## 2021-08-12 DIAGNOSIS — I10 Essential (primary) hypertension: Secondary | ICD-10-CM | POA: Diagnosis present

## 2021-08-12 DIAGNOSIS — Z79899 Other long term (current) drug therapy: Secondary | ICD-10-CM | POA: Diagnosis not present

## 2021-08-12 DIAGNOSIS — E876 Hypokalemia: Secondary | ICD-10-CM | POA: Insufficient documentation

## 2021-08-12 DIAGNOSIS — Z21 Asymptomatic human immunodeficiency virus [HIV] infection status: Secondary | ICD-10-CM | POA: Diagnosis present

## 2021-08-12 LAB — BASIC METABOLIC PANEL
Anion gap: 16 — ABNORMAL HIGH (ref 5–15)
BUN: 48 mg/dL — ABNORMAL HIGH (ref 6–20)
CO2: 27 mmol/L (ref 22–32)
Calcium: 10.2 mg/dL (ref 8.9–10.3)
Chloride: 99 mmol/L (ref 98–111)
Creatinine, Ser: 6.48 mg/dL — ABNORMAL HIGH (ref 0.44–1.00)
GFR, Estimated: 7 mL/min — ABNORMAL LOW (ref >60)
Glucose, Bld: 163 mg/dL — ABNORMAL HIGH (ref 70–99)
Potassium: 4.8 mmol/L (ref 3.5–5.1)
Sodium: 142 mmol/L (ref 135–145)

## 2021-08-12 LAB — I-STAT CHEM 8, ED
BUN: 95 mg/dL — ABNORMAL HIGH (ref 6–20)
Calcium, Ion: 1.21 mmol/L (ref 1.15–1.40)
Chloride: 118 mmol/L — ABNORMAL HIGH (ref 98–111)
Creatinine, Ser: 7.8 mg/dL — ABNORMAL HIGH (ref 0.44–1.00)
Glucose, Bld: 76 mg/dL (ref 70–99)
HCT: 40 % (ref 36.0–46.0)
Hemoglobin: 13.6 g/dL (ref 12.0–15.0)
Potassium: 6.5 mmol/L (ref 3.5–5.1)
Sodium: 139 mmol/L (ref 135–145)
TCO2: 18 mmol/L — ABNORMAL LOW (ref 22–32)

## 2021-08-12 LAB — RESP PANEL BY RT-PCR (FLU A&B, COVID) ARPGX2
Influenza A by PCR: NEGATIVE
Influenza B by PCR: NEGATIVE
SARS Coronavirus 2 by RT PCR: NEGATIVE

## 2021-08-12 LAB — CBC WITH DIFFERENTIAL/PLATELET
Abs Immature Granulocytes: 0.01 10*3/uL (ref 0.00–0.07)
Basophils Absolute: 0 10*3/uL (ref 0.0–0.1)
Basophils Relative: 1 %
Eosinophils Absolute: 0.8 10*3/uL — ABNORMAL HIGH (ref 0.0–0.5)
Eosinophils Relative: 17 %
HCT: 39.9 % (ref 36.0–46.0)
Hemoglobin: 12.8 g/dL (ref 12.0–15.0)
Immature Granulocytes: 0 %
Lymphocytes Relative: 19 %
Lymphs Abs: 0.8 10*3/uL (ref 0.7–4.0)
MCH: 33.5 pg (ref 26.0–34.0)
MCHC: 32.1 g/dL (ref 30.0–36.0)
MCV: 104.5 fL — ABNORMAL HIGH (ref 80.0–100.0)
Monocytes Absolute: 0.4 10*3/uL (ref 0.1–1.0)
Monocytes Relative: 9 %
Neutro Abs: 2.4 10*3/uL (ref 1.7–7.7)
Neutrophils Relative %: 54 %
Platelets: 201 10*3/uL (ref 150–400)
RBC: 3.82 MIL/uL — ABNORMAL LOW (ref 3.87–5.11)
RDW: 15.1 % (ref 11.5–15.5)
WBC: 4.5 10*3/uL (ref 4.0–10.5)
nRBC: 0 % (ref 0.0–0.2)

## 2021-08-12 LAB — CBC
HCT: 39.2 % (ref 36.0–46.0)
Hemoglobin: 12.7 g/dL (ref 12.0–15.0)
MCH: 33.6 pg (ref 26.0–34.0)
MCHC: 32.4 g/dL (ref 30.0–36.0)
MCV: 103.7 fL — ABNORMAL HIGH (ref 80.0–100.0)
Platelets: 204 10*3/uL (ref 150–400)
RBC: 3.78 MIL/uL — ABNORMAL LOW (ref 3.87–5.11)
RDW: 15 % (ref 11.5–15.5)
WBC: 5 10*3/uL (ref 4.0–10.5)
nRBC: 0 % (ref 0.0–0.2)

## 2021-08-12 LAB — COMPREHENSIVE METABOLIC PANEL
ALT: 47 U/L — ABNORMAL HIGH (ref 0–44)
AST: 65 U/L — ABNORMAL HIGH (ref 15–41)
Albumin: 3.3 g/dL — ABNORMAL LOW (ref 3.5–5.0)
Alkaline Phosphatase: 115 U/L (ref 38–126)
Anion gap: 8 (ref 5–15)
BUN: 65 mg/dL — ABNORMAL HIGH (ref 6–20)
CO2: 15 mmol/L — ABNORMAL LOW (ref 22–32)
Calcium: 9.3 mg/dL (ref 8.9–10.3)
Chloride: 117 mmol/L — ABNORMAL HIGH (ref 98–111)
Creatinine, Ser: 7.28 mg/dL — ABNORMAL HIGH (ref 0.44–1.00)
GFR, Estimated: 6 mL/min — ABNORMAL LOW (ref >60)
Glucose, Bld: 73 mg/dL (ref 70–99)
Potassium: 6.8 mmol/L (ref 3.5–5.1)
Sodium: 140 mmol/L (ref 135–145)
Total Bilirubin: 1.1 mg/dL (ref 0.3–1.2)
Total Protein: 7.4 g/dL (ref 6.5–8.1)

## 2021-08-12 LAB — HEPATITIS B SURFACE ANTIGEN
Hepatitis B Surface Ag: NONREACTIVE
Hepatitis B Surface Ag: NONREACTIVE

## 2021-08-12 LAB — HEPATITIS B SURFACE ANTIBODY,QUALITATIVE: Hep B S Ab: NONREACTIVE

## 2021-08-12 LAB — RENAL FUNCTION PANEL
Albumin: 3.4 g/dL — ABNORMAL LOW (ref 3.5–5.0)
Anion gap: 8 (ref 5–15)
BUN: 64 mg/dL — ABNORMAL HIGH (ref 6–20)
CO2: 15 mmol/L — ABNORMAL LOW (ref 22–32)
Calcium: 9.5 mg/dL (ref 8.9–10.3)
Chloride: 118 mmol/L — ABNORMAL HIGH (ref 98–111)
Creatinine, Ser: 7.28 mg/dL — ABNORMAL HIGH (ref 0.44–1.00)
GFR, Estimated: 6 mL/min — ABNORMAL LOW (ref >60)
Glucose, Bld: 90 mg/dL (ref 70–99)
Phosphorus: 4.3 mg/dL (ref 2.5–4.6)
Potassium: 4.9 mmol/L (ref 3.5–5.1)
Sodium: 141 mmol/L (ref 135–145)

## 2021-08-12 LAB — MAGNESIUM: Magnesium: 2.5 mg/dL — ABNORMAL HIGH (ref 1.7–2.4)

## 2021-08-12 LAB — BRAIN NATRIURETIC PEPTIDE: B Natriuretic Peptide: 7.5 pg/mL (ref 0.0–100.0)

## 2021-08-12 MED ORDER — ALTEPLASE 2 MG IJ SOLR
2.0000 mg | Freq: Once | INTRAMUSCULAR | Status: AC | PRN
  Administered 2021-08-12: 2 mg
  Filled 2021-08-12: qty 2

## 2021-08-12 MED ORDER — SODIUM CHLORIDE 0.9 % IV SOLN: 100.0000 mL | INTRAVENOUS | Status: DC | PRN

## 2021-08-12 MED ORDER — HEPARIN SODIUM (PORCINE) 1000 UNIT/ML IJ SOLN
INTRAMUSCULAR | Status: AC
  Filled 2021-08-12: qty 4

## 2021-08-12 MED ORDER — LIDOCAINE-PRILOCAINE 2.5-2.5 % EX CREA
1.0000 "application " | TOPICAL_CREAM | CUTANEOUS | Status: DC | PRN
  Filled 2021-08-12: qty 5

## 2021-08-12 MED ORDER — ALBUTEROL SULFATE (2.5 MG/3ML) 0.083% IN NEBU
5.0000 mg | INHALATION_SOLUTION | Freq: Once | RESPIRATORY_TRACT | Status: AC
  Administered 2021-08-12: 5 mg via RESPIRATORY_TRACT
  Filled 2021-08-12: qty 6

## 2021-08-12 MED ORDER — HEPARIN SODIUM (PORCINE) 1000 UNIT/ML DIALYSIS
3500.0000 [IU] | INTRAMUSCULAR | Status: DC | PRN
  Filled 2021-08-12 (×2): qty 4

## 2021-08-12 MED ORDER — CHLORHEXIDINE GLUCONATE CLOTH 2 % EX PADS: 6.0000 | MEDICATED_PAD | Freq: Every day | CUTANEOUS | Status: DC

## 2021-08-12 MED ORDER — HEPARIN SODIUM (PORCINE) 1000 UNIT/ML DIALYSIS
1000.0000 [IU] | INTRAMUSCULAR | Status: DC | PRN
  Filled 2021-08-12: qty 1

## 2021-08-12 MED ORDER — LIDOCAINE HCL (PF) 1 % IJ SOLN: 5.0000 mL | INTRAMUSCULAR | Status: DC | PRN

## 2021-08-12 MED ORDER — PENTAFLUOROPROP-TETRAFLUOROETH EX AERO
1.0000 "application " | INHALATION_SPRAY | CUTANEOUS | Status: DC | PRN
  Filled 2021-08-12: qty 116

## 2021-08-12 MED ORDER — SODIUM ZIRCONIUM CYCLOSILICATE 10 G PO PACK
10.0000 g | PACK | Freq: Once | ORAL | Status: DC
Start: 2021-08-12 — End: 2021-08-14

## 2021-08-12 NOTE — H&P (Signed)
Brooke Baldwin TWS:[id number] DOB: 03/21/1976 DOA: 08/12/2021     PCP: Shanon Rosser, PA-C   Outpatient Specialists:  ID Dr. Juleen China?  Patient arrived to ER on 08/12/21 at 1049 Referred by Attending Toy Baker, MD   Patient coming from: home Lives alone   Chief Complaint:   Chief Complaint  Patient presents with   Cough    HPI: Brooke Baldwin is a 46 y.o. female with medical history significant of HIV, HTN ESRD    Presented with   cough 2 wks of cough and SOB, reports she has not been able to get HD for the past 3wk or more Reports she was unable to get off work as she works in the [employer] and currently it in a busy season.  Her work has prevented her from getting dialysis.  She still makes urine She is HIV positive and has been taking her medications  She has been followed by ID but cannot remember when was the last time she had her numbers checked. Denies any fever chills no nausea no vomiting or diarrhea. Reports her hemodialysis catheter always gives her trouble and when she does get dialyzed usually there is some problems with that such as intermittent clotting.  She She has had a graft in the past and failed. Patient states that the plan for her to undergo PD dialysis placement in the future so that she does not have to take time off of work so that she can get her  dialysis done     Has  been vaccinated against [place]  had  flu shot   Initial COVID TEST  NEGATIVE   Lab Results  Component Value Date   [place] 08/12/2021   [place] NEGATIVE 04/07/2021   [place] NEGATIVE 11/01/2020   [place] NEGATIVE 10/17/2020     Regarding pertinent Chronic problems:       HTN  no on meds  HIV on [place]  ESRD non complaint with HD Lab Results  Component Value Date   CREATININE 6.48 (H) 08/12/2021   CREATININE 7.28 (H) 08/12/2021   CREATININE 7.80 (H) 08/12/2021    While in ER:   Initial potassium elevated was  treated with albuterol Patient was able to make it to hemodialysis received 2 hours and then had to be stopped because catheter was malfunctioning. Went back to emergency department repeat potassium now down to 4.5.  Since patient now has no workable access vascular surgery was called will see patient in consult in a.m.  Cough was worked up the chest x-ray which was in general unremarkable except for Calcified structure on further imaging felt to be either lymph node or calcified thyroid nodule Ordered   CXR - Indeterminate peripherally calcified structure projecting over the medial aspect of the left supraclavicular region adjacent to the trachea.   Neck XRAYThe peripherally calcified structure at the base of the neck on the left near the thoracic inlet does not appear to be within the airway and is favored to represent a vascular calcification, calcified thyroid nodule, or calcified lymph node. A CT scan could best evaluate this structure. A CT scan of the chest, with contrast, including the base of the neck should be sufficient.  Following Medications were ordered in ER: Medications  sodium zirconium cyclosilicate (LOKELMA) packet 10 g (0 g Oral Hold 08/12/21 2103)  Chlorhexidine Gluconate Cloth 2 % PADS 6 each (has no administration in time range)  heparin sodium (porcine) 1000 UNIT/ML injection (has no administration  in time range)  albuterol (PROVENTIL) (2.5 MG/3ML) 0.083% nebulizer solution 5 mg (5 mg Nebulization Given 08/12/21 1156)  alteplase (CATHFLO ACTIVASE) injection 2 mg (2 mg Intracatheter Given 08/12/21 1639)    _______________________________________________________ ER Provider Called:   vasc Surgery   Dr.Robins They Recommend admit to medicine   Will see in AM      ED Triage Vitals  Enc Vitals Group     BP 08/12/21 1125 (!) 148/102     Pulse Rate 08/12/21 1125 92     Resp 08/12/21 1125 18     Temp 08/12/21 1434 97.8 F (36.6 C)     Temp src --      SpO2 08/12/21  1125 100 %     Weight 08/12/21 1434 183 lb 10.3 oz (83.3 kg)     Height --      Head Circumference --      Peak Flow --      Pain Score 08/12/21 1123 0     Pain Loc --      Pain Edu? --      Excl. in [place]? --   TMAX(24)@     _________________________________________ Significant initial  Findings: Abnormal Labs Reviewed  COMPREHENSIVE METABOLIC PANEL - Abnormal; Notable for the following components:      Result Value   Potassium 6.8 (*)    Chloride 117 (*)    CO2 15 (*)    BUN 65 (*)    Creatinine, Ser 7.28 (*)    Albumin 3.3 (*)    AST 65 (*)    ALT 47 (*)    GFR, Estimated 6 (*)    All other components within normal limits  CBC WITH DIFFERENTIAL/PLATELET - Abnormal; Notable for the following components:   RBC 3.82 (*)    MCV 104.5 (*)    Eosinophils Absolute 0.8 (*)    All other components within normal limits  RENAL FUNCTION PANEL - Abnormal; Notable for the following components:   Chloride 118 (*)    CO2 15 (*)    BUN 64 (*)    Creatinine, Ser 7.28 (*)    Albumin 3.4 (*)    GFR, Estimated 6 (*)    All other components within normal limits  CBC - Abnormal; Notable for the following components:   RBC 3.78 (*)    MCV 103.7 (*)    All other components within normal limits  BASIC METABOLIC PANEL - Abnormal; Notable for the following components:   Glucose, Bld 163 (*)    BUN 48 (*)    Creatinine, Ser 6.48 (*)    GFR, Estimated 7 (*)    Anion gap 16 (*)    All other components within normal limits  I-STAT CHEM 8, ED - Abnormal; Notable for the following components:   Potassium 6.5 (*)    Chloride 118 (*)    BUN 95 (*)    Creatinine, Ser 7.80 (*)    TCO2 18 (*)    All other components within normal limits      ECG: Ordered Personally reviewed by me showing: HR : 77 Rhythm:  NSR   no evidence of ischemic changes QTC 425    The recent clinical data is shown below. Vitals:   08/12/21 1830 08/12/21 1840 08/12/21 1845 08/12/21 2011  BP: (!) 150/87 106/77 (!)  124/96 124/90  Pulse: (!) 115 92 90 (!) 103  Resp: 20 18 18 20   Temp:  98 F (36.7 C) 98 F (36.7 C)  SpO2: 99% 99% 99% 100%  Weight:   83 kg       WBC     Component Value Date/Time   WBC 5.0 08/12/2021 1350   LYMPHSABS 0.8 08/12/2021 1114   LYMPHSABS 1.1 12/09/2019 0954   MONOABS 0.4 08/12/2021 1114   EOSABS 0.8 (H) 08/12/2021 1114   EOSABS 0.2 12/09/2019 0954   BASOSABS 0.0 08/12/2021 1114   BASOSABS 0.0 12/09/2019 0954     Results for orders placed or performed during the hospital encounter of 08/12/21  Resp Panel by RT-PCR (Flu A&B, Covid) Nasopharyngeal Swab     Status: None   Collection Time: 08/12/21 11:15 AM   Specimen: Nasopharyngeal Swab; Nasopharyngeal(NP) swabs in vial transport medium  Result Value Ref Range Status   SARS Coronavirus 2 by RT PCR NEGATIVE NEGATIVE Final         Influenza A by PCR NEGATIVE NEGATIVE Final   Influenza B by PCR NEGATIVE NEGATIVE Final           _______________________________________________ Hospitalist was called for admission for needs HD catheter access, hyperkalemia  The following Work up has been ordered so far:  Orders Placed This Encounter  Procedures   Resp Panel by RT-PCR (Flu A&B, Covid) Nasopharyngeal Swab   DG Chest Port 1 View   DG Neck Soft Tissue   Comprehensive metabolic panel   CBC WITH DIFFERENTIAL   Brain natriuretic peptide   Urinalysis, Routine w reflex microscopic   Hepatitis B surface antigen   Renal function panel   CBC   Hepatitis B surface antigen   Hepatitis B surface antibody   Hepatitis B surface antibody,quantitative   Basic metabolic panel   Magnesium   Cardiac monitoring   Initiate Carrier Fluid Protocol   Informed Consent Details: Physician/Practitioner Attestation; Transcribe to consent form and obtain patient signature   Pre-Hemodialysis Protocol - Day of Dialysis   Post-Dialysis Protocol - Day of Dialysis   Cardiac monitoring   Consult to nephrology   Consult to nephrology    Consult to vascular surgery   Consult to hospitalist   Oxygen therapy   Pulse oximetry, continuous   I-Stat Chem 8, ED ([facility], [facility], [facility] only)   ED EKG   EKG 12-Lead   Place in observation (patient's expected length of stay will be less than 2 midnights)     OTHER Significant initial  Findings:  labs showing:    Recent Labs  Lab 08/12/21 1114 08/12/21 1211 08/12/21 1350 08/12/21 2032  NA 140 139 141 142  K 6.8* 6.5* 4.9 4.8  CO2 15*  --  15* 27  GLUCOSE 73 76 90 163*  BUN 65* 95* 64* 48*  CREATININE 7.28* 7.80* 7.28* 6.48*  CALCIUM 9.3  --  9.5 10.2  PHOS  --   --  4.3  --     Cr stable,  Lab Results  Component Value Date   CREATININE 6.48 (H) 08/12/2021   CREATININE 7.28 (H) 08/12/2021   CREATININE 7.80 (H) 08/12/2021    Recent Labs  Lab 08/12/21 1114 08/12/21 1350  AST 65*  --   ALT 47*  --   ALKPHOS 115  --   BILITOT 1.1  --   PROT 7.4  --   ALBUMIN 3.3* 3.4*   Lab Results  Component Value Date   CALCIUM 10.2 08/12/2021   PHOS 4.3 08/12/2021    Plt: Lab Results  Component Value Date   PLT 204 08/12/2021     COVID-19 Labs  No results for input(s): DDIMER, FERRITIN, LDH, CRP in the last 72 hours.  Lab Results  Component Value Date   SARSCOV2NAA NEGATIVE 08/12/2021   SARSCOV2NAA NEGATIVE 04/07/2021   [place] NEGATIVE 11/01/2020   [place] NEGATIVE 10/17/2020          Recent Labs  Lab 08/12/21 1114 08/12/21 1211 08/12/21 1350  WBC 4.5  --  5.0  NEUTROABS 2.4  --   --   HGB 12.8 13.6 12.7  HCT 39.9 40.0 39.2  MCV 104.5*  --  103.7*  PLT 201  --  204    HG/HCT  stable,       Component Value Date/Time   HGB 12.7 08/12/2021 1350   HGB 14.1 12/09/2019 0954   HCT 39.2 08/12/2021 1350   HCT 41.3 12/09/2019 0954   MCV 103.7 (H) 08/12/2021 1350   MCV 86 12/09/2019 0954      BNP (last 3 results) Recent Labs    08/12/21 1350  BNP 7.5       Cultures:    Component Value Date/Time   SDES URINE, CLEAN CATCH 07/21/2021 2009    [place] NONE 07/21/2021 2009   CULT (A) 07/21/2021 2009    <10,000 COLONIES/mL INSIGNIFICANT GROWTH Performed at [facility] [address], [place], [place] [address]    REPTSTATUS 07/23/2021 FINAL 07/21/2021 2009     Radiological Exams on Admission: DG Neck Soft Tissue  Result Date: 08/12/2021 CLINICAL DATA:  Evaluate possible foreign body in neck. EXAM: NECK SOFT TISSUES - 1+ VIEW COMPARISON:  None. FINDINGS: The peripherally calcified structure in the left neck is to the left of the airway on the frontal view. The exact location is unclear. This could be vascular in origin. A calcified thyroid nodule could have a similar appearance as could a calcified lymph node. I doubt this is a true foreign body. The soft tissues of the neck are otherwise normal. Calcifications in the neck bilaterally are consistent with carotid calcifications. No other abnormalities. IMPRESSION: The peripherally calcified structure at the base of the neck on the left near the thoracic inlet does not appear to be within the airway and is favored to represent a vascular calcification, calcified thyroid nodule, or calcified lymph node. A CT scan could best evaluate this structure. A CT scan of the chest, with contrast, including the base of the neck should be sufficient. Electronically Signed   By: Dorise Bullion III M.D.   On: 08/12/2021 13:09   DG Chest Port 1 View  Result Date: 08/12/2021 CLINICAL DATA:  Cough for 2 weeks. EXAM: PORTABLE CHEST 1 VIEW COMPARISON:  06/30/2020 FINDINGS: Right chest wall dialysis catheter is identified with tip projecting over the SVC. Heart size and mediastinal contours are normal. No pleural effusion or edema. No airspace densities identified. 2.3 cm indeterminate peripherally calcified structure projecting over the medial aspect of the left supraclavicular region, adjacent to the trachea. Not seen previously. This is of uncertain clinical significance. IMPRESSION: 1. No acute  cardiopulmonary abnormalities. 2. Indeterminate peripherally calcified structure projecting over the medial aspect of the left supraclavicular region adjacent to the trachea. This is of uncertain clinical significance but was not seen on the previous exams. Consider AP and lateral radiographs of the soft tissue of neck to assess for foreign body in this patient presenting with cough. Electronically Signed   By: Kerby Moors M.D.   On: 08/12/2021 11:34   _______________________________________________________________________________________________________ Latest  Blood pressure 124/90, pulse (!) 103, temperature 98 F (36.7  C), resp. rate 20, weight 83 kg, last menstrual period 04/02/2015, SpO2 100 %.   Vitals  labs and radiology finding personally reviewed  Review of Systems:    Pertinent positives include:  dyspnea on exertion,non-productive cough,  Constitutional:  No weight loss, night sweats, Fevers, chills, fatigue, weight loss  HEENT:  No headaches, Difficulty swallowing,Tooth/dental problems,Sore throat,  No sneezing, itching, ear ache, nasal congestion, post nasal drip,  Cardio-vascular:  No chest pain, Orthopnea, PND, anasarca, dizziness, palpitations.no Bilateral lower extremity swelling  GI:  No heartburn, indigestion, abdominal pain, nausea, vomiting, diarrhea, change in bowel habits, loss of appetite, melena, blood in stool, hematemesis Resp:  no shortness of breath at rest. No  No excess mucus, no productive cough, No  No coughing up of blood.No change in color of mucus.No wheezing. Skin:  no rash or lesions. No jaundice GU:  no dysuria, change in color of urine, no urgency or frequency. No straining to urinate.  No flank pain.  Musculoskeletal:  No joint pain or no joint swelling. No decreased range of motion. No back pain.  Psych:  No change in mood or affect. No depression or anxiety. No memory loss.  Neuro: no localizing neurological complaints, no tingling, no  weakness, no double vision, no gait abnormality, no slurred speech, no confusion  All systems reviewed and apart from [place] all are negative _______________________________________________________________________________________________ Past Medical History:   Past Medical History:  Diagnosis Date   Anemia associated with chronic renal failure    Atrophic kidney    bilateral   CIN I (cervical intraepithelial neoplasia I)    ESRD (end stage renal disease) on dialysis ([place]) 05/2020   hemodialysis  T/ Th/ Sat  @ [facility] ([address] in [place], ( 10-17-2020  s/p left AVG 11/ 2021 unable to use as of yet, currently using right IJ tunneled cath (right side of upper chest) for dialysis   FSGS (focal segmental glomerulosclerosis)    renal biopsy 04-15-2020 in epic , secondary to HTN/ HIV/ Obese   Heart palpitations 11/ 2021  (10-17-2020  pt stated the palpitations does not bother her and has no other symptoms , stated they started 11/ 2021)   pt was been seen by cardiology, dr s. Patria Mane ([facility] in [place]),  per pt had event monitor for 17 days , was told normal with  no arrhythmias,  and stated echo is scheduled for 10-28-2020   History of vulvar dysplasia    HIV (human immunodeficiency virus infection) ([place])    followed by ID-- dr a. wallace   Hypertension    S/P arteriovenous (AV) fistula creation 07-04-2020 @[facility]    left forearm,  07-28-2020  angioplasty of thrombosis   Secondary hyperparathyroidism of renal origin ([place])    Tachycardia    Trigeminal neuralgia    ride side ---  neurologist--- dr Krista Blue   (10-17-2020  pt stated last episode about 4 months ago was mild)   VIN II (vulvar intraepithelial neoplasia II)       Past Surgical History:  Procedure Laterality Date   A/V FISTULAGRAM Left 07/28/2020   Procedure: A/V FISTULAGRAM;  Surgeon: Marty Heck, MD;  Location: [facility];  Service: Cardiovascular;  Laterality: Left;   AV FISTULA PLACEMENT Left 07/04/2020    Procedure: INSERTION OF LEFT ARTERIOVENOUS (AV) GORE-TEX GRAFT ARM (ACUSEAL);  Surgeon: Cherre Robins, MD;  Location: [facility];  Service: Vascular;  Laterality: Left;   CERVICAL BIOPSY  W/ LOOP ELECTRODE EXCISION  2005  CO2 LASER APPLICATION N/A 8/93/8101   Procedure: CO2 LASER APPLICATION;  Surgeon: Everitt Amber, MD;  Location: [facility];  Service: Gynecology;  Laterality: N/A;   ORIF ANKLE FRACTURE Right 2012   per pt retained hardware   PERIPHERAL VASCULAR BALLOON ANGIOPLASTY Left 07/28/2020   Procedure: PERIPHERAL VASCULAR BALLOON ANGIOPLASTY;  Surgeon: Marty Heck, MD;  Location: [facility];  Service: Cardiovascular;  Laterality: Left;  AVF   VULVA SURGERY  2013   excision    Social History:  Ambulatory   independently       reports that she has never smoked. She has never used smokeless tobacco. She reports that she does not currently use alcohol. She reports that she does not currently use drugs.   Family History:   Family History  Problem Relation Age of Onset   Diabetes Mother    Healthy Father    Breast cancer Maternal Aunt    Pancreatic cancer Maternal Grandfather    Colon cancer Neg Hx    Colon polyps Neg Hx    Esophageal cancer Neg Hx    Rectal cancer Neg Hx    Stomach cancer Neg Hx    Prostate cancer Neg Hx    Endometrial cancer Neg Hx    Ovarian cancer Neg Hx    ______________________________________________________________________________________________ Allergies: Allergies  Allergen Reactions   Baclofen Other (See Comments)    Caused intolerable dizziness    Gabapentin Other (See Comments)    Caused intolerable dizziness   Penicillins Other (See Comments)    Unknown childhood reaction      Prior to Admission medications   Medication Sig Start Date End Date Taking? Authorizing Provider  aluminum-magnesium hydroxide-simethicone (MAALOX) 751-025-85 MG/5ML SUSP Take 30 mLs by mouth 4 (four) times daily -  before meals  and at bedtime. 07/21/21   White, Leitha Schuller, NP  atovaquone (MEPRON) 750 MG/5ML suspension Take 10 mLs (1,500 mg total) by mouth daily. 05/23/21   Mignon Pine, DO  AURYXIA 1 GM 210 MG(Fe) tablet SMARTSIG:2 Tablet(s) By Mouth 5 Times Daily 11/30/20   [provider]  bictegravir-emtricitabine-tenofovir AF (BIKTARVY) 50-200-25 MG TABS tablet Take 1 tablet by mouth daily. 05/23/21   Mignon Pine, DO  multivitamin (RENA-VIT) TABS tablet Take 1 tablet by mouth at bedtime.    [provider]  triamcinolone (KENALOG) 0.1 % Apply topically 2 (two) times daily as needed. Patient not taking: Reported on 05/30/2021 10/06/20   [provider]  trimethoprim-polymyxin b (POLYTRIM) ophthalmic solution Place 1 drop into the left eye every 4 (four) hours. 07/21/21   Hans Eden, NP    ___________________________________________________________________________________________________ Physical Exam: Vitals with BMI 08/12/2021 08/12/2021 08/12/2021  Height - - -  Weight - 183 lbs -  BMI - - -  Systolic 277 824 235  Diastolic 90 96 77  Pulse 361 90 92     1. General:  in No   Acute distress   Chronically ill   -appearing 2. Psychological: Alert and   Oriented 3. Head/ENT:   Dry Mucous Membranes                          Head Non traumatic, neck supple                         Poor Dentition 4. SKIN:  decreased Skin turgor,  Skin clean Dry and intact no rash 5. Heart:  Regular rate and rhythm no  Murmur, no Rub or gallop 6. Lungs:   no wheezes or crackles   7. Abdomen: Soft,  non-tender, Non distended   bowel sounds present 8. Lower extremities: no clubbing, cyanosis, no  edema 9. Neurologically Grossly intact, moving all 4 extremities equally  10. MSK: Normal range of motion    Chart has been reviewed  ______________________________________________________________________________________________  Assessment/Plan 46 y.o. female with medical history significant of  HIV, HTN ESRD    Admitted for   needs HD catheter access, hyperkalemia  Present on Admission:  Hyperkalemia  End stage renal disease ([place])  Secondary hyperparathyroidism ([place])  HIV (human immunodeficiency virus infection) ([place])  Hypertension     End stage renal disease [facility]) Nephrology been paged by ED provider plan to attempt to hemodialyzed in a.m. Reportedly patient already had 2 hours of hemodialysis today Regarding difficulty with hemodialysis access.  ER provider discussed case with vascular surgery who is aware and will see her in consult tomorrow  Hyperkalemia At the hemodialysis and treatment in ER latest potassium down to 4.9 we will repeat and follow  Secondary hyperparathyroidism ([place]) Continue home medications continue home medications  HIV (human immunodeficiency virus infection) ([place]) Continue home medications Recheck CD4 count and HIV viral load  Lab Results  Component Value Date   CD4TABS 81 (L) 05/04/2021   CD4TABS 42 (L) 12/21/2020   CD4TABS <35 (L) 07/01/2020   Lab Results  Component Value Date   HIV1RNAQUANT Not Detected 05/04/2021     Hypertension Patient not taking any medications at home.  Continue to monitor blood pressure    Cough mild - CXR wnl  Neck calcification - unclear significance work up as an outpt  Other plan as per orders.  DVT prophylaxis:  SCD      Code Status:    Code Status: Prior FULL CODE   as per patient   I had personally discussed CODE STATUS with patient      Family Communication:   Family at  Bedside    Disposition Plan:    To home once workup is complete and patient is stable   Following barriers for discharge:                            Electrolytes corrected                                                          Will need consultants to evaluate patient prior to discharge    Consults called:   vascular surgery is aware, spoke to Dr. Augustin Coupe with nephrology   Admission status:  ED Disposition     ED  Disposition  [place]: [place] [[id number]]  Level of Care: Telemetry Medical [104]  May place patient in observation at [facility] or [place] if equivalent level of care is available:: No  Covid Evaluation: Confirmed COVID Negative  Diagnosis: Hyperkalemia [295621]  Admitting Physician: Toy Baker [[id number]]  Attending Physician: Toy Baker [[id number]]           Obs         Level of care     tele  For 12H  Lab Results  Component Value Date   [place] NEGATIVE 08/12/2021     Precautions: admitted as   Covid Negative    Guerino Caporale 08/12/2021, 10:06 PM   [facility]     after 2 AM please page floor coverage PA If 7AM-7PM, please contact the day team taking care of the patient using [email or web address]   Patient was evaluated in the context of the global COVID-19 pandemic, which necessitated consideration that the patient might be at risk for infection with the SARS-CoV-2 virus that causes COVID-19. Institutional protocols and algorithms that pertain to the evaluation of patients at risk for COVID-19 are in a state of rapid change based on information released by regulatory bodies including the CDC and federal and state organizations. These policies and algorithms were followed during the patient's care.

## 2021-08-12 NOTE — Progress Notes (Signed)
Removed pt from tx due to frequent alarms RN provided cath flow

## 2021-08-12 NOTE — ED Notes (Signed)
Pt in Dialysis

## 2021-08-12 NOTE — ED Notes (Signed)
Patient transported to HD 

## 2021-08-12 NOTE — Assessment & Plan Note (Signed)
Patient not taking any medications at home.  Continue to monitor blood pressure

## 2021-08-12 NOTE — Subjective & Objective (Signed)
2 wks of cough and SOB, reports she has not been able to get HD for the past 3wk or more Reports she was unable to get off work as she works in the [employer] and currently it in a busy season.  Her work has prevented her from getting dialysis.  She still makes urine She is HIV positive and has been taking her medications  She has been followed by ID but cannot remember when was the last time she had her numbers checked. Denies any fever chills no nausea no vomiting or diarrhea. Reports her hemodialysis catheter always gives her trouble and when she does get dialyzed usually there is some problems with that such as intermittent clotting.  She She has had a graft in the past and failed. Patient states that the plan for her to undergo PD dialysis placement in the future so that she does not have to take time off of work so that she can get her  dialysis done

## 2021-08-12 NOTE — Discharge Instructions (Signed)
As discussed, it is very important that you attend your scheduled dialysis session on Tuesday.  Please be there at 11:30 AM.  Return here for concerning changes in your condition.

## 2021-08-12 NOTE — ED Provider Notes (Signed)
[facility] EMERGENCY DEPARTMENT Provider Note   CSN: [id number] Arrival date & time: 08/12/21  1049     History  Chief Complaint  Patient presents with   Cough    Brooke Baldwin is a 46 y.o. female.  HPI Patient presents with concern of cough, fatigue, edema.  Patient is multiple medical issues including HIV, end-stage renal disease.  Last dialysis was 1 month ago.  She states that she has been unable to go to dialysis due to work concerns.  Today with worsening cough, fatigue she presents for evaluation.  No reported fever, vomiting, diarrhea, chest pain.    Home Medications Prior to Admission medications   Medication Sig Start Date End Date Taking? Authorizing Provider  aluminum-magnesium hydroxide-simethicone (MAALOX) [phone] MG/5ML SUSP Take 30 mLs by mouth 4 (four) times daily -  before meals and at bedtime. 07/21/21   White, Leitha Schuller, NP  atovaquone (MEPRON) 750 MG/5ML suspension Take 10 mLs (1,500 mg total) by mouth daily. 05/23/21   Mignon Pine, DO  AURYXIA 1 GM 210 MG(Fe) tablet SMARTSIG:2 Tablet(s) By Mouth 5 Times Daily 11/30/20   [provider]  bictegravir-emtricitabine-tenofovir AF (BIKTARVY) 50-200-25 MG TABS tablet Take 1 tablet by mouth daily. 05/23/21   Mignon Pine, DO  multivitamin (RENA-VIT) TABS tablet Take 1 tablet by mouth at bedtime.    [provider]  triamcinolone (KENALOG) 0.1 % Apply topically 2 (two) times daily as needed. Patient not taking: Reported on 05/30/2021 10/06/20   [provider]  trimethoprim-polymyxin b (POLYTRIM) ophthalmic solution Place 1 drop into the left eye every 4 (four) hours. 07/21/21   Hans Eden, NP      Allergies    Baclofen, Gabapentin, and Penicillins    Review of Systems   Review of Systems  Constitutional:        Per HPI, otherwise negative  HENT:         Per HPI, otherwise negative  Respiratory:         Per HPI, otherwise negative   Cardiovascular:        Per HPI, otherwise negative  Gastrointestinal:  Negative for vomiting.  Endocrine:       Negative aside from HPI  Genitourinary:        Neg aside from HPI   Musculoskeletal:        Per HPI, otherwise negative  Skin: Negative.   Allergic/Immunologic: Positive for immunocompromised state.  Neurological:  Negative for syncope.   Physical Exam Updated Vital Signs BP (!) 148/102 (BP Location: Right Arm)    Pulse 92    Resp 18    LMP 04/02/2015 (Approximate)    SpO2 100%  Physical Exam Vitals and nursing note reviewed.  Constitutional:      General: She is not in acute distress.    Appearance: She is well-developed.  HENT:     Head: Normocephalic and atraumatic.  Eyes:     Conjunctiva/sclera: Conjunctivae normal.  Cardiovascular:     Rate and Rhythm: Regular rhythm. Tachycardia present.  Pulmonary:     Breath sounds: Wheezing present.  Abdominal:     General: There is no distension.  Musculoskeletal:     Comments: AV HD access left arm unremarkable  Skin:    General: Skin is warm and dry.  Neurological:     Mental Status: She is alert and oriented to person, place, and time.     Cranial Nerves: No cranial nerve deficit.    ED  Results / Procedures / Treatments   Labs (all labs ordered are listed, but only abnormal results are displayed) Labs Reviewed  CBC WITH DIFFERENTIAL/PLATELET - Abnormal; Notable for the following components:      Result Value   RBC 3.82 (*)    MCV 104.5 (*)    Eosinophils Absolute 0.8 (*)    All other components within normal limits  I-STAT CHEM 8, ED - Abnormal; Notable for the following components:   Potassium 6.5 (*)    Chloride 118 (*)    BUN 95 (*)    Creatinine, Ser 7.80 (*)    TCO2 18 (*)    All other components within normal limits  RESP PANEL BY RT-PCR (FLU A&B, COVID) ARPGX2  COMPREHENSIVE METABOLIC PANEL  BRAIN NATRIURETIC PEPTIDE  URINALYSIS, ROUTINE W REFLEX MICROSCOPIC  HEPATITIS B SURFACE ANTIGEN     EKG None  Radiology DG Chest Port 1 View  Result Date: 08/12/2021 CLINICAL DATA:  Cough for 2 weeks. EXAM: PORTABLE CHEST 1 VIEW COMPARISON:  06/30/2020 FINDINGS: Right chest wall dialysis catheter is identified with tip projecting over the SVC. Heart size and mediastinal contours are normal. No pleural effusion or edema. No airspace densities identified. 2.3 cm indeterminate peripherally calcified structure projecting over the medial aspect of the left supraclavicular region, adjacent to the trachea. Not seen previously. This is of uncertain clinical significance. IMPRESSION: 1. No acute cardiopulmonary abnormalities. 2. Indeterminate peripherally calcified structure projecting over the medial aspect of the left supraclavicular region adjacent to the trachea. This is of uncertain clinical significance but was not seen on the previous exams. Consider AP and lateral radiographs of the soft tissue of neck to assess for foreign body in this patient presenting with cough. Electronically Signed   By: Kerby Moors M.D.   On: 08/12/2021 11:34    Procedures Procedures    Medications Ordered in ED Medications  albuterol (PROVENTIL) (2.5 MG/3ML) 0.083% nebulizer solution 5 mg (5 mg Nebulization Given 08/12/21 1156)    ED Course/ Medical Decision Making/ A&P                           Medical Decision Making With consideration of fluid overload status, pneumonia, COVID, abnormal electrolytes given the absence of dialysis for a month, and immunocompromise state patient was placed on continuous cardiac monitor, pulse oximetry.  Cardiac 70 sinus normal Pulse ox 99% room air normal Patient found to have critically elevated potassium value on initial labs, received albuterol, Lokelma.  On repeat exam the patient has sinus tachycardia, rate 105. Patient is aware of all findings.  I discussed her case with our nephrology team and the patient will require dialysis today.  Ensure the patient also has access  on discharge to dialysis on Tuesday, patient is aware of this as well.  CRITICAL CARE Performed by: Carmin Muskrat Total critical care time: 35 minutes Critical care time was exclusive of separately billable procedures and treating other patients. Critical care was necessary to treat or prevent imminent or life-threatening deterioration. Critical care was time spent personally by me on the following activities: development of treatment plan with patient and/or surrogate as well as nursing, discussions with consultants, evaluation of patient's response to treatment, examination of patient, obtaining history from patient or surrogate, ordering and performing treatments and interventions, ordering and review of laboratory studies, ordering and review of radiographic studies, pulse oximetry and re-evaluation of patient's condition.  Final Clinical Impression(s) / ED Diagnoses Final diagnoses:  Hyperkalemia  Acute cough  Peripheral edema     Carmin Muskrat, MD 08/12/21 1306

## 2021-08-12 NOTE — Assessment & Plan Note (Signed)
Continue home medications continue home medications

## 2021-08-12 NOTE — Assessment & Plan Note (Signed)
Nephrology been paged by ED provider plan to attempt to hemodialyzed in a.m. Reportedly patient already had 2 hours of hemodialysis today Regarding difficulty with hemodialysis access.  ER provider discussed case with vascular surgery who is aware and will see her in consult tomorrow

## 2021-08-12 NOTE — Assessment & Plan Note (Signed)
Continue home medications Recheck CD4 count and HIV viral load  Lab Results  Component Value Date   CD4TABS 81 (L) 05/04/2021   CD4TABS 42 (L) 12/21/2020   CD4TABS <35 (L) 07/01/2020   Lab Results  Component Value Date   HIV1RNAQUANT Not Detected 05/04/2021

## 2021-08-12 NOTE — Progress Notes (Signed)
[place] [facility] BRIEF PROGRESS NOTE  Clemmie Buelna [id number] 09-Feb-1976  ESRD patient on HD TTS presented to the ED after missing over a month of dialysis with worsened cough and fatigue.  Last outpatient dialysis on 07/06/2022.  Per outpatient policy, she was required to have labs completed before she could run at outpatient clinic.  Pertinent findings in the ED include K 6.5, BUN 95, SCr 7.8, COVID negative, CXR with no pleural effusions or edema.   Outpatient HD orders:  [facility] TTS 4hrs 39min 2K 2.5Ca TDC Heparin 3000 unit qHD Calcitriol 0.73mcg qHD  Plan for HD today with decreased flow rate due no treatments in over a month.  Will use low K bath due to hyperkalemia.  No plans for admission at this point.  Patient will return to the ED post HD and re evaluated for discharge.  Encourage compliance with dialysis on regular TTS schedule at outpatient clinic.   Jen Mow, PA-C [facility] Pager: [phone]

## 2021-08-12 NOTE — ED Provider Notes (Signed)
Patient was evaluated in the emergency department due to missed dialysis treatments.  Initial lab work and imaging were obtained noted that patient had elevated potassium wound requiring hemodialysis.  Previous provider consulted with nephrology who recommends dialysis and upon completion will be discharged home.  It is noted that patient's right tunneled catheter was clogged and only received approximately 2 hours of dialysis treatment today and was brought back down to the emergency department.  Patient is currently stable at this time will recheck potassium magnesium, will consult with nephrology, vascular surgery and hospitalist for admission  Dr. Quentin Cornwall of vascular surgery will consult on the patient tomorrow morning  Spoke with Dr. Augustin Coupe of nephrology he will evaluate the patient in the morning.  Spoke with Dr. Roel Cluck who will admit the patient.      Marcello Fennel, PA-C 08/12/21 2211    Pattricia Boss, MD 08/13/21 Joen Laura

## 2021-08-12 NOTE — Procedures (Signed)
Pt was removed from treatment due to frequent alarms.  She was given cathflow 1.58mls via arterial and venous ports of her Permcath.  Following instillation of cathflow we waited 45 min then withdrew the cathflow and restarted treatment.  After returning to treatment it was noted that her TMP pressures were increasing and she finally clotted off her dialyzer.  Pt refused to return to treatment and Augustin Coupe MD was notified.  Order received to abort treatment.

## 2021-08-12 NOTE — ED Triage Notes (Signed)
Pt reports cough for over 2 weeks. She is a dialysis patient and needs lab work to restart dialysis treatments. Last dialysis treatment was first week of Nov, pt reports no complications due to lack of dialysis.

## 2021-08-12 NOTE — Assessment & Plan Note (Signed)
At the hemodialysis and treatment in ER latest potassium down to 4.9 we will repeat and follow

## 2021-08-13 ENCOUNTER — Encounter (HOSPITAL_COMMUNITY): Payer: Self-pay | Admitting: Internal Medicine

## 2021-08-13 ENCOUNTER — Other Ambulatory Visit: Payer: Self-pay

## 2021-08-13 DIAGNOSIS — T8241XA Breakdown (mechanical) of vascular dialysis catheter, initial encounter: Secondary | ICD-10-CM | POA: Diagnosis not present

## 2021-08-13 DIAGNOSIS — E875 Hyperkalemia: Secondary | ICD-10-CM | POA: Diagnosis not present

## 2021-08-13 LAB — CBC WITH DIFFERENTIAL/PLATELET
Abs Immature Granulocytes: 0.01 10*3/uL (ref 0.00–0.07)
Basophils Absolute: 0 10*3/uL (ref 0.0–0.1)
Basophils Relative: 1 %
Eosinophils Absolute: 0.7 10*3/uL — ABNORMAL HIGH (ref 0.0–0.5)
Eosinophils Relative: 14 %
HCT: 36.2 % (ref 36.0–46.0)
Hemoglobin: 12.1 g/dL (ref 12.0–15.0)
Immature Granulocytes: 0 %
Lymphocytes Relative: 23 %
Lymphs Abs: 1.1 10*3/uL (ref 0.7–4.0)
MCH: 33.8 pg (ref 26.0–34.0)
MCHC: 33.4 g/dL (ref 30.0–36.0)
MCV: 101.1 fL — ABNORMAL HIGH (ref 80.0–100.0)
Monocytes Absolute: 0.7 10*3/uL (ref 0.1–1.0)
Monocytes Relative: 14 %
Neutro Abs: 2.3 10*3/uL (ref 1.7–7.7)
Neutrophils Relative %: 48 %
Platelets: 178 10*3/uL (ref 150–400)
RBC: 3.58 MIL/uL — ABNORMAL LOW (ref 3.87–5.11)
RDW: 15.1 % (ref 11.5–15.5)
WBC: 4.8 10*3/uL (ref 4.0–10.5)
nRBC: 0 % (ref 0.0–0.2)

## 2021-08-13 LAB — COMPREHENSIVE METABOLIC PANEL
ALT: 37 U/L (ref 0–44)
AST: 33 U/L (ref 15–41)
Albumin: 3.1 g/dL — ABNORMAL LOW (ref 3.5–5.0)
Alkaline Phosphatase: 114 U/L (ref 38–126)
Anion gap: 10 (ref 5–15)
BUN: 52 mg/dL — ABNORMAL HIGH (ref 6–20)
CO2: 17 mmol/L — ABNORMAL LOW (ref 22–32)
Calcium: 8.4 mg/dL — ABNORMAL LOW (ref 8.9–10.3)
Chloride: 111 mmol/L (ref 98–111)
Creatinine, Ser: 6.03 mg/dL — ABNORMAL HIGH (ref 0.44–1.00)
GFR, Estimated: 8 mL/min — ABNORMAL LOW (ref >60)
Glucose, Bld: 79 mg/dL (ref 70–99)
Potassium: 4.2 mmol/L (ref 3.5–5.1)
Sodium: 138 mmol/L (ref 135–145)
Total Bilirubin: 0.5 mg/dL (ref 0.3–1.2)
Total Protein: 7 g/dL (ref 6.5–8.1)

## 2021-08-13 LAB — HEPATITIS B SURFACE ANTIBODY, QUANTITATIVE: Hep B S AB Quant (Post): <3.1 m[IU]/mL — ABNORMAL LOW (ref >9.9)

## 2021-08-13 LAB — URINALYSIS, ROUTINE W REFLEX MICROSCOPIC
Bilirubin Urine: NEGATIVE
Glucose, UA: NEGATIVE mg/dL
Ketones, ur: NEGATIVE mg/dL
Nitrite: NEGATIVE
Protein, ur: 100 mg/dL — AB
Specific Gravity, Urine: 1.02 (ref 1.005–1.030)
pH: 8 (ref 5.0–8.0)

## 2021-08-13 LAB — URINALYSIS, MICROSCOPIC (REFLEX): Bacteria, UA: NONE SEEN

## 2021-08-13 LAB — MAGNESIUM: Magnesium: 1.8 mg/dL (ref 1.7–2.4)

## 2021-08-13 LAB — TSH: TSH: 0.606 u[IU]/mL (ref 0.350–4.500)

## 2021-08-13 LAB — PHOSPHORUS: Phosphorus: 5.2 mg/dL — ABNORMAL HIGH (ref 2.5–4.6)

## 2021-08-13 MED ORDER — FERRIC CITRATE 1 GM 210 MG(FE) PO TABS: 420.0000 mg | ORAL_TABLET | ORAL | Status: DC

## 2021-08-13 MED ORDER — FLUTICASONE PROPIONATE 50 MCG/ACT NA SUSP
2.0000 | Freq: Every day | NASAL | Status: DC
  Filled 2021-08-13: qty 16

## 2021-08-13 MED ORDER — HYDROCODONE-ACETAMINOPHEN 5-325 MG PO TABS: 1.0000 | ORAL_TABLET | ORAL | Status: DC | PRN

## 2021-08-13 MED ORDER — BICTEGRAVIR-EMTRICITAB-TENOFOV 50-200-25 MG PO TABS
1.0000 | ORAL_TABLET | Freq: Every day | ORAL | Status: DC
  Administered 2021-08-13 – 2021-08-15 (×3): 1 via ORAL
  Filled 2021-08-13 (×5): qty 1

## 2021-08-13 MED ORDER — ACETAMINOPHEN 325 MG PO TABS: 650.0000 mg | ORAL_TABLET | Freq: Four times a day (QID) | ORAL | Status: DC | PRN

## 2021-08-13 MED ORDER — CHLORHEXIDINE GLUCONATE CLOTH 2 % EX PADS
6.0000 | MEDICATED_PAD | Freq: Every day | CUTANEOUS | Status: DC
  Administered 2021-08-14: 6 via TOPICAL

## 2021-08-13 MED ORDER — FERRIC CITRATE 1 GM 210 MG(FE) PO TABS
420.0000 mg | ORAL_TABLET | Freq: Two times a day (BID) | ORAL | Status: DC
  Administered 2021-08-13: 420 mg via ORAL
  Filled 2021-08-13 (×3): qty 2

## 2021-08-13 MED ORDER — SODIUM CHLORIDE 0.9 % IV SOLN: 250.0000 mL | INTRAVENOUS | Status: DC | PRN

## 2021-08-13 MED ORDER — RENA-VITE PO TABS
1.0000 | ORAL_TABLET | Freq: Every day | ORAL | Status: DC
  Administered 2021-08-13 (×2): 1 via ORAL
  Filled 2021-08-13 (×4): qty 1

## 2021-08-13 MED ORDER — SODIUM CHLORIDE 0.9% FLUSH
3.0000 mL | Freq: Two times a day (BID) | INTRAVENOUS | Status: DC
  Administered 2021-08-13 (×2): 3 mL via INTRAVENOUS

## 2021-08-13 MED ORDER — ACETAMINOPHEN 650 MG RE SUPP: 650.0000 mg | Freq: Four times a day (QID) | RECTAL | Status: DC | PRN

## 2021-08-13 MED ORDER — SODIUM CHLORIDE 0.9% FLUSH: 3.0000 mL | INTRAVENOUS | Status: DC | PRN

## 2021-08-13 NOTE — H&P (View-Only) (Signed)
Hospital Consult    Reason for Consult: End-stage renal disease with clotted tunneled HD line Requesting Physician: Dr. Vanita Panda MRN #:  [id number]  History of Present Illness:   This is a 46 y.o. female with end-stage renal disease noncompliant on dialysis.  She presented to the ED yesterday with cough and need for labs after missing a months worth of dialysis.  Dialysis was attempted, it was found that her tunneled HD line was occluded.  I was called for HD line replacement.  On exam, Brooke Baldwin was doing well.  She had no complaints other than a chronic cough that been present for a number of weeks.  She denies shortness of breath, fogginess, or feeling of fluid overload.  She stated that even prior to missing a month of dialysis, her tunneled line has never worked appropriately.   Past Medical History:  Diagnosis Date   Anemia associated with chronic renal failure    Atrophic kidney    bilateral   CIN I (cervical intraepithelial neoplasia I)    ESRD (end stage renal disease) on dialysis ([place]) 05/2020   hemodialysis  T/ Th/ Sat  @ [facility] ([address] in [place], ( 10-17-2020  s/p left AVG 11/ 2021 unable to use as of yet, currently using right IJ tunneled cath (right side of upper chest) for dialysis   FSGS (focal segmental glomerulosclerosis)    renal biopsy 04-15-2020 in epic , secondary to HTN/ HIV/ Obese   Heart palpitations 11/ 2021  (10-17-2020  pt stated the palpitations does not bother her and has no other symptoms , stated they started 11/ 2021)   pt was been seen by cardiology, dr s. Patria Mane ([facility] in [place]),  per pt had event monitor for 17 days , was told normal with  no arrhythmias,  and stated echo is scheduled for 10-28-2020   History of vulvar dysplasia    HIV (human immunodeficiency virus infection) ([place])    followed by ID-- dr a. wallace   Hypertension    S/P arteriovenous (AV) fistula creation 07-04-2020 @[facility]    left forearm,  07-28-2020  angioplasty of  thrombosis   Secondary hyperparathyroidism of renal origin ([place])    Tachycardia    Trigeminal neuralgia    ride side ---  neurologist--- dr Krista Blue   (10-17-2020  pt stated last episode about 4 months ago was mild)   VIN II (vulvar intraepithelial neoplasia II)     Past Surgical History:  Procedure Laterality Date   A/V FISTULAGRAM Left 07/28/2020   Procedure: A/V FISTULAGRAM;  Surgeon: Marty Heck, MD;  Location: [facility];  Service: Cardiovascular;  Laterality: Left;   AV FISTULA PLACEMENT Left 07/04/2020   Procedure: INSERTION OF LEFT ARTERIOVENOUS (AV) GORE-TEX GRAFT ARM (ACUSEAL);  Surgeon: Cherre Declan Mier, MD;  Location: [facility];  Service: Vascular;  Laterality: Left;   CERVICAL BIOPSY  W/ LOOP ELECTRODE EXCISION  6834   CO2 LASER APPLICATION N/A 1/96/2229   Procedure: CO2 LASER APPLICATION;  Surgeon: Everitt Amber, MD;  Location: [facility];  Service: Gynecology;  Laterality: N/A;   ORIF ANKLE FRACTURE Right 2012   per pt retained hardware   PERIPHERAL VASCULAR BALLOON ANGIOPLASTY Left 07/28/2020   Procedure: PERIPHERAL VASCULAR BALLOON ANGIOPLASTY;  Surgeon: Marty Heck, MD;  Location: [facility];  Service: Cardiovascular;  Laterality: Left;  AVF   VULVA SURGERY  2013   excision    Allergies  Allergen Reactions   Baclofen Other (See Comments)  Caused intolerable dizziness    Gabapentin Other (See Comments)    Caused intolerable dizziness   Penicillins Other (See Comments)    Unknown childhood reaction     Prior to Admission medications   Medication Sig Start Date End Date Taking? Authorizing Provider  aluminum-magnesium hydroxide-simethicone (MAALOX) [phone] MG/5ML SUSP Take 30 mLs by mouth 4 (four) times daily -  before meals and at bedtime. Patient taking differently: Take 30 mLs by mouth 4 (four) times daily as needed (gas/acid reflux). 07/21/21  Yes White, Adrienne R, NP  bictegravir-emtricitabine-tenofovir AF  (BIKTARVY) 50-200-25 MG TABS tablet Take 1 tablet by mouth daily. Patient taking differently: Take 1 tablet by mouth at bedtime. 05/23/21  Yes Mignon Pine, DO  ferric citrate (AURYXIA) 1 GM 210 MG(Fe) tablet Take 420 mg by mouth See admin instructions. Take 2 tablets (420 mg) by mouth once or twice daily with meals   Yes [provider]  multivitamin (RENA-VIT) TABS tablet Take 1 tablet by mouth at bedtime.   Yes [provider]  atovaquone (MEPRON) 750 MG/5ML suspension Take 10 mLs (1,500 mg total) by mouth daily. Patient not taking: Reported on 08/12/2021 05/23/21   Mignon Pine, DO    Social History   Socioeconomic History   Marital status: Single    Spouse name: Not on file   Number of children: 0   Years of education: college   Highest education level: Bachelor's degree (e.g., BA, AB, BS)  Occupational History   Occupation: Administrator, arts  Tobacco Use   Smoking status: Never   Smokeless tobacco: Never  Vaping Use   Vaping Use: Never used  Substance and Sexual Activity   Alcohol use: Not Currently   Drug use: Not Currently   Sexual activity: Not Currently    Birth control/protection: Post-menopausal  Other Topics Concern   Not on file  Social History Narrative   Lives at home with significant other.   Right-handed.   No daily caffeine use.   Social Determinants of Health   Financial Resource Strain: Not on file  Food Insecurity: Not on file  Transportation Needs: Not on file  Physical Activity: Not on file  Stress: Not on file  Social Connections: Not on file  Intimate Partner Violence: Not on file     Family History  Problem Relation Age of Onset   Diabetes Mother    Healthy Father    Breast cancer Maternal Aunt    Pancreatic cancer Maternal Grandfather    Colon cancer Neg Hx    Colon polyps Neg Hx    Esophageal cancer Neg Hx    Rectal cancer Neg Hx    Stomach cancer Neg Hx    Prostate cancer Neg Hx    Endometrial cancer Neg Hx     Ovarian cancer Neg Hx     ROS: Otherwise negative unless mentioned in HPI  Physical Examination  Vitals:   08/13/21 0430 08/13/21 0700  BP:  (!) 154/102  Pulse: (!) 102 92  Resp: 19 12  Temp:    SpO2: 97% 98%   Body mass index is 33.47 kg/m.  General:  WDWN in NAD Gait: Not observed HENT: WNL, normocephalic Pulmonary: normal non-labored breathing, without Rales, rhonchi,  wheezing Cardiac: regular rate Abdomen:  soft, NT/ND, no masses Skin: without rashes Vascular Exam/Pulses: 2+ radial pulses bilaterally Extremities: without ischemic changes, without Gangrene , without cellulitis; without open wounds;  Left arm forearm loop graft is occluded. Musculoskeletal: no muscle wasting or atrophy  Neurologic: A&O X 3;  No focal weakness or paresthesias are detected; speech is fluent/normal Psychiatric:  The pt has Normal affect. Lymph:  Unremarkable  CBC    Component Value Date/Time   WBC 4.8 08/13/2021 0500   RBC 3.58 (L) 08/13/2021 0500   HGB 12.1 08/13/2021 0500   HGB 14.1 12/09/2019 0954   HCT 36.2 08/13/2021 0500   HCT 41.3 12/09/2019 0954   PLT 178 08/13/2021 0500   MCV 101.1 (H) 08/13/2021 0500   MCV 86 12/09/2019 0954   MCH 33.8 08/13/2021 0500   MCHC 33.4 08/13/2021 0500   RDW 15.1 08/13/2021 0500   RDW 13.7 12/09/2019 0954   LYMPHSABS 1.1 08/13/2021 0500   LYMPHSABS 1.1 12/09/2019 0954   MONOABS 0.7 08/13/2021 0500   EOSABS 0.7 (H) 08/13/2021 0500   EOSABS 0.2 12/09/2019 0954   BASOSABS 0.0 08/13/2021 0500   BASOSABS 0.0 12/09/2019 0954    BMET    Component Value Date/Time   NA 138 08/13/2021 0500   NA 139 12/09/2019 0954   K 4.2 08/13/2021 0500   CL 111 08/13/2021 0500   CO2 17 (L) 08/13/2021 0500   GLUCOSE 79 08/13/2021 0500   BUN 52 (H) 08/13/2021 0500   BUN 21 12/15/2019 0848   CREATININE 6.03 (H) 08/13/2021 0500   CREATININE 7.36 (H) 05/04/2021 1001   CALCIUM 8.4 (L) 08/13/2021 0500   CALCIUM 7.0 (L) 06/30/2020 1459   GFRNONAA 8 (L)  08/13/2021 0500   GFRNONAA 12 (L) 08/19/2020 0000   GFRAA 14 (L) 08/19/2020 0000    COAGS: Lab Results  Component Value Date   INR 0.9 04/13/2020     Non-Invasive Vascular Imaging:   none  Statin:  No. Beta Blocker:  No. Aspirin:  No. ACEI:  No. ARB:  No. CCB use:  No Other antiplatelets/anticoagulants:  No.    ASSESSMENT/PLAN: This is a 46 y.o. female with end-stage renal disease requiring dialysis.  Current tunneled HD access is occluded.  Patient is in need of replacement.  I have discussed the risks and benefits of tunneled HD line removal, and replacement-likely in the left internal jugular vein, Elsie elected to proceed.  I have the patient booked for tomorrow, should this change from a nephrology standpoint, I am happy to cancel her case. Please make n.p.o. at midnight   Cassandria Santee MD MS [facility] [phone] 08/13/2021  11:17 AM

## 2021-08-13 NOTE — ED Notes (Signed)
Received verbal report from Matt B RN at this time °

## 2021-08-13 NOTE — ED Notes (Signed)
Admit Provider at bedside. 

## 2021-08-13 NOTE — Consult Note (Signed)
[facility] Renal Consultation Note    Indication for Consultation:  Management of ESRD/hemodialysis; anemia, hypertension/volume and secondary hyperparathyroidism  DUK:GURK, Nicki Reaper, PA-C  HPI: Brooke Baldwin is a 46 y.o. 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  Past medical history significant for HIV, HTN, vulvar/cervical CIN.  Patient directed to the ED by her outpatient dialysis center for labs after missing >1 month of HD.  Reports she missed dialysis due to not being able to get her schedule accommodated from the [place] over the holidays.  [facility] and [facility] said they could not accommodate her dialysis schedule during "peak season".  Peak season end on January 16 per patient and then she can request to have schedule accommodations as prior to peak season.  States she has no one to pay her bills and had to work to care for her self.  Denies CP, SOB, abdominal pain, n/v/d, weakness and fatigue.  Admits to non productive cough for a couple weeks.  Admits to recently trying to eat healthy, making smoothies with oranges, pineapples, pomegranate and eating leafy greens.  No dressing present on her catheter on admission.  Not sure how long ago it came off.  States she has just been washing around it.    Pertinent findings during work up include K 6.5, BUN 95, and SCr 7.8.  Patient under went dialysis yesterday with improvement of K to 4.2.  Treatment had to be stopped early due to clotted access which was not improved follow cath flow.  Patient being admitted for further evaluation and management.    Past Medical History:  Diagnosis Date   Anemia associated with chronic renal failure    Atrophic kidney    bilateral   CIN I (cervical intraepithelial neoplasia I)    ESRD (end stage renal disease) on dialysis ([place]) 05/2020   hemodialysis  T/ Th/ Sat  @ [facility] ([address] in [place], ( 10-17-2020  s/p left AVG 11/ 2021 unable  to use as of yet, currently using right IJ tunneled cath (right side of upper chest) for dialysis   FSGS (focal segmental glomerulosclerosis)    renal biopsy 04-15-2020 in epic , secondary to HTN/ HIV/ Obese   Heart palpitations 11/ 2021  (10-17-2020  pt stated the palpitations does not bother her and has no other symptoms , stated they started 11/ 2021)   pt was been seen by cardiology, dr s. Patria Mane ([facility] in [place]),  per pt had event monitor for 17 days , was told normal with  no arrhythmias,  and stated echo is scheduled for 10-28-2020   History of vulvar dysplasia    HIV (human immunodeficiency virus infection) ([place])    followed by ID-- dr a. wallace   Hypertension    S/P arteriovenous (AV) fistula creation 07-04-2020 @[facility]    left forearm,  07-28-2020  angioplasty of thrombosis   Secondary hyperparathyroidism of renal origin ([place])    Tachycardia    Trigeminal neuralgia    ride side ---  neurologist--- dr Krista Blue   (10-17-2020  pt stated last episode about 4 months ago was mild)   VIN II (vulvar intraepithelial neoplasia II)    Past Surgical History:  Procedure Laterality Date   A/V FISTULAGRAM Left 07/28/2020   Procedure: A/V FISTULAGRAM;  Surgeon: Marty Heck, MD;  Location: [place] [facility];  Service: Cardiovascular;  Laterality: Left;   AV FISTULA PLACEMENT Left  07/04/2020   Procedure: INSERTION OF LEFT ARTERIOVENOUS (AV) GORE-TEX GRAFT ARM (ACUSEAL);  Surgeon: Cherre Robins, MD;  Location: [place];  Service: Vascular;  Laterality: Left;   CERVICAL BIOPSY  W/ LOOP ELECTRODE EXCISION  5809   CO2 LASER APPLICATION N/A 9/83/3825   Procedure: CO2 LASER APPLICATION;  Surgeon: Everitt Amber, MD;  Location: [place];  Service: Gynecology;  Laterality: N/A;   ORIF ANKLE FRACTURE Right 2012   per pt retained hardware   PERIPHERAL VASCULAR BALLOON ANGIOPLASTY Left 07/28/2020   Procedure: PERIPHERAL VASCULAR BALLOON ANGIOPLASTY;  Surgeon: Marty Heck, MD;  Location: [place] [facility];  Service: Cardiovascular;  Laterality: Left;  AVF   VULVA SURGERY  2013   excision   Family History  Problem Relation Age of Onset   Diabetes Mother    Healthy Father    Breast cancer Maternal Aunt    Pancreatic cancer Maternal Grandfather    Colon cancer Neg Hx    Colon polyps Neg Hx    Esophageal cancer Neg Hx    Rectal cancer Neg Hx    Stomach cancer Neg Hx    Prostate cancer Neg Hx    Endometrial cancer Neg Hx    Ovarian cancer Neg Hx    Social History:  reports that she has never smoked. She has never used smokeless tobacco. She reports that she does not currently use alcohol. She reports that she does not currently use drugs. Allergies  Allergen Reactions   Baclofen Other (See Comments)    Caused intolerable dizziness    Gabapentin Other (See Comments)    Caused intolerable dizziness   Penicillins Other (See Comments)    Unknown childhood reaction    Prior to Admission medications   Medication Sig Start Date End Date Taking? Authorizing Provider  aluminum-magnesium hydroxide-simethicone (MAALOX) 053-976-73 MG/5ML SUSP Take 30 mLs by mouth 4 (four) times daily -  before meals and at bedtime. Patient taking differently: Take 30 mLs by mouth 4 (four) times daily as needed (gas/acid reflux). 07/21/21  Yes White, Adrienne R, NP  bictegravir-emtricitabine-tenofovir AF (BIKTARVY) 50-200-25 MG TABS tablet Take 1 tablet by mouth daily. Patient taking differently: Take 1 tablet by mouth at bedtime. 05/23/21  Yes Mignon Pine, DO  ferric citrate (AURYXIA) 1 GM 210 MG(Fe) tablet Take 420 mg by mouth See admin instructions. Take 2 tablets (420 mg) by mouth once or twice daily with meals   Yes [provider]  multivitamin (RENA-VIT) TABS tablet Take 1 tablet by mouth at bedtime.   Yes [provider]  atovaquone (MEPRON) 750 MG/5ML suspension Take 10 mLs (1,500 mg total) by mouth daily. Patient not taking: Reported on  08/12/2021 05/23/21   Mignon Pine, DO   Current Facility-Administered Medications  Medication Dose Route Frequency Provider Last Rate Last Admin   0.9 %  sodium chloride infusion  250 mL Intravenous PRN Toy Baker, MD       acetaminophen (TYLENOL) tablet 650 mg  650 mg Oral Q6H PRN Toy Baker, MD       Or   acetaminophen (TYLENOL) suppository 650 mg  650 mg Rectal Q6H PRN Doutova, Anastassia, MD       bictegravir-emtricitabine-tenofovir AF (BIKTARVY) 50-200-25 MG per tablet 1 tablet  1 tablet Oral QHS Toy Baker, MD   1 tablet at 08/13/21 0406   Chlorhexidine Gluconate Cloth 2 % PADS 6 each  6 each Topical Q0600 Toy Baker, MD  Chlorhexidine Gluconate Cloth 2 % PADS 6 each  6 each Topical Q0600 Jerard Bays, Ria Comment, PA       ferric citrate (AURYXIA) tablet 420 mg  420 mg Oral BID WC Toy Baker, MD   420 mg at 08/13/21 0913   HYDROcodone-acetaminophen (NORCO/VICODIN) 5-325 MG per tablet 1-2 tablet  1-2 tablet Oral Q4H PRN Toy Baker, MD       multivitamin (RENA-VIT) tablet 1 tablet  1 tablet Oral QHS Toy Baker, MD   1 tablet at 08/13/21 0405   sodium chloride flush (NS) 0.9 % injection 3 mL  3 mL Intravenous Q12H Doutova, Anastassia, MD   3 mL at 08/13/21 0025   sodium chloride flush (NS) 0.9 % injection 3 mL  3 mL Intravenous PRN Doutova, Anastassia, MD       sodium zirconium cyclosilicate (LOKELMA) packet 10 g  10 g Oral Once Toy Baker, MD       Current Outpatient Medications  Medication Sig Dispense Refill   aluminum-magnesium hydroxide-simethicone (MAALOX) 846-962-95 MG/5ML SUSP Take 30 mLs by mouth 4 (four) times daily -  before meals and at bedtime. (Patient taking differently: Take 30 mLs by mouth 4 (four) times daily as needed (gas/acid reflux).) 1680 mL 0   bictegravir-emtricitabine-tenofovir AF (BIKTARVY) 50-200-25 MG TABS tablet Take 1 tablet by mouth daily. (Patient taking differently: Take 1 tablet by  mouth at bedtime.) 30 tablet 5   ferric citrate (AURYXIA) 1 GM 210 MG(Fe) tablet Take 420 mg by mouth See admin instructions. Take 2 tablets (420 mg) by mouth once or twice daily with meals     multivitamin (RENA-VIT) TABS tablet Take 1 tablet by mouth at bedtime.     atovaquone (MEPRON) 750 MG/5ML suspension Take 10 mLs (1,500 mg total) by mouth daily. (Patient not taking: Reported on 08/12/2021) 210 mL 5   Labs: Basic Metabolic Panel: Recent Labs  Lab 08/12/21 1350 08/12/21 2032 08/13/21 0500  NA 141 142 138  K 4.9 4.8 4.2  CL 118* 99 111  CO2 15* 27 17*  GLUCOSE 90 163* 79  BUN 64* 48* 52*  CREATININE 7.28* 6.48* 6.03*  CALCIUM 9.5 10.2 8.4*  PHOS 4.3  --  5.2*   Liver Function Tests: Recent Labs  Lab 08/12/21 1114 08/12/21 1350 08/13/21 0500  AST 65*  --  33  ALT 47*  --  37  ALKPHOS 115  --  114  BILITOT 1.1  --  0.5  PROT 7.4  --  7.0  ALBUMIN 3.3* 3.4* 3.1*   CBC: Recent Labs  Lab 08/12/21 1114 08/12/21 1211 08/12/21 1350 08/13/21 0500  WBC 4.5  --  5.0 4.8  NEUTROABS 2.4  --   --  2.3  HGB 12.8 13.6 12.7 12.1  HCT 39.9 40.0 39.2 36.2  MCV 104.5*  --  103.7* 101.1*  PLT 201  --  204 178   Studies/Results: DG Neck Soft Tissue  Result Date: 08/12/2021 CLINICAL DATA:  Evaluate possible foreign body in neck. EXAM: NECK SOFT TISSUES - 1+ VIEW COMPARISON:  None. FINDINGS: The peripherally calcified structure in the left neck is to the left of the airway on the frontal view. The exact location is unclear. This could be vascular in origin. A calcified thyroid nodule could have a similar appearance as could a calcified lymph node. I doubt this is a true foreign body. The soft tissues of the neck are otherwise normal. Calcifications in the neck bilaterally are consistent with carotid calcifications. No other abnormalities. IMPRESSION:  The peripherally calcified structure at the base of the neck on the left near the thoracic inlet does not appear to be within the airway  and is favored to represent a vascular calcification, calcified thyroid nodule, or calcified lymph node. A CT scan could best evaluate this structure. A CT scan of the chest, with contrast, including the base of the neck should be sufficient. Electronically Signed   By: Dorise Bullion III M.D.   On: 08/12/2021 13:09   DG Chest Port 1 View  Result Date: 08/12/2021 CLINICAL DATA:  Cough for 2 weeks. EXAM: PORTABLE CHEST 1 VIEW COMPARISON:  06/30/2020 FINDINGS: Right chest wall dialysis catheter is identified with tip projecting over the SVC. Heart size and mediastinal contours are normal. No pleural effusion or edema. No airspace densities identified. 2.3 cm indeterminate peripherally calcified structure projecting over the medial aspect of the left supraclavicular region, adjacent to the trachea. Not seen previously. This is of uncertain clinical significance. IMPRESSION: 1. No acute cardiopulmonary abnormalities. 2. Indeterminate peripherally calcified structure projecting over the medial aspect of the left supraclavicular region adjacent to the trachea. This is of uncertain clinical significance but was not seen on the previous exams. Consider AP and lateral radiographs of the soft tissue of neck to assess for foreign body in this patient presenting with cough. Electronically Signed   By: Kerby Moors M.D.   On: 08/12/2021 11:34    ROS: All others negative except those listed in HPI.  Physical Exam: Vitals:   08/13/21 0415 08/13/21 0430 08/13/21 0700 08/13/21 0746  BP:   (!) 154/102   Pulse: 94 (!) 102 92   Resp: (!) 21 19 12    Temp:      SpO2: 98% 97% 98%   Weight:    83 kg  Height:    5\' 2"  (1.575 m)     General: well appearing female in NAD Head: NCAT sclera not icteric MMM Neck: Supple. No lymphadenopathy Lungs: mostly CTAB, scattered wheezing. No rales or rhonchi. Breathing is unlabored on RA. Heart: RRR. No murmur, rubs or gallops.  Abdomen: soft, nontender, +BS, no guarding, no  rebound tenderness M/S:  Equal strength b/l in upper and lower extremities.  Lower extremities:no edema, ischemic changes, or open wounds  Neuro: AAOx3. Moves all extremities spontaneously. Psych:  Responds to questions appropriately with a normal affect. Dialysis Access[facility]  Dialysis Orders:  TTS - [facility]  4 hrs 28min, BFR 400, DFR AF 1.5,  EDW 81kg, 2K/ 2.5Ca  Access: TDC  Heparin 3000 unit bolus Calcitriol 0.80mcg PO qHD   Assessment/Plan:  Non compliance with dialysis - last outpatient dialysis on 07/06/22.  Issues with work causing her to miss HD.  Reinforced importance of compliance.  Will contact outpatient SW to see if able to assist with work issues.  Hyperkalemia - 2/2 non compliance with HD and dietary indiscretions.  Reviewed low K diet. Improved after dialysis/lokelma.    ESRD -  on HD TTS.  Shortened HD yesterday d/t clotted access.  Plans for new [facility] placement tomorrow with HD to follow. Malfunctioning TDC - new TDC to be placed tomorrow by [facility]. Appreciate their assistance. Discussed care of [facility] and risks of not having dressing over [place].   Hypertension/volume  - Blood pressure well controlled, continue home meds. Does not appear volume overloaded.  UF as tolerated.    Anemia of CKD - Hgb 12.1. No indication for ESA.  Secondary Hyperparathyroidism -  Ca and phos in goal.  Continue  VDRA.   Nutrition - Renal diet w/fluid restrictions.  HIV  Jen Mow, PA-C [facility] 08/13/2021, 9:59 AM

## 2021-08-13 NOTE — Consult Note (Signed)
Hospital Consult    Reason for Consult: End-stage renal disease with clotted tunneled HD line Requesting Physician: Dr. Vanita Panda MRN #:  [id number]  History of Present Illness:   This is a 46 y.o. female with end-stage renal disease noncompliant on dialysis.  She presented to the ED yesterday with cough and need for labs after missing a months worth of dialysis.  Dialysis was attempted, it was found that her tunneled HD line was occluded.  I was called for HD line replacement.  On exam, Brooke Baldwin was doing well.  She had no complaints other than a chronic cough that been present for a number of weeks.  She denies shortness of breath, fogginess, or feeling of fluid overload.  She stated that even prior to missing a month of dialysis, her tunneled line has never worked appropriately.   Past Medical History:  Diagnosis Date   Anemia associated with chronic renal failure    Atrophic kidney    bilateral   CIN I (cervical intraepithelial neoplasia I)    ESRD (end stage renal disease) on dialysis ([place]) 05/2020   hemodialysis  T/ Th/ Sat  @ [facility] ([address] in [place], ( 10-17-2020  s/p left AVG 11/ 2021 unable to use as of yet, currently using right IJ tunneled cath (right side of upper chest) for dialysis   FSGS (focal segmental glomerulosclerosis)    renal biopsy 04-15-2020 in epic , secondary to HTN/ HIV/ Obese   Heart palpitations 11/ 2021  (10-17-2020  pt stated the palpitations does not bother her and has no other symptoms , stated they started 11/ 2021)   pt was been seen by cardiology, dr s. Patria Mane ([facility] in [place]),  per pt had event monitor for 17 days , was told normal with  no arrhythmias,  and stated echo is scheduled for 10-28-2020   History of vulvar dysplasia    HIV (human immunodeficiency virus infection) ([place])    followed by ID-- dr a. wallace   Hypertension    S/P arteriovenous (AV) fistula creation 07-04-2020 @[facility]    left forearm,  07-28-2020  angioplasty of  thrombosis   Secondary hyperparathyroidism of renal origin ([place])    Tachycardia    Trigeminal neuralgia    ride side ---  neurologist--- dr Krista Blue   (10-17-2020  pt stated last episode about 4 months ago was mild)   VIN II (vulvar intraepithelial neoplasia II)     Past Surgical History:  Procedure Laterality Date   A/V FISTULAGRAM Left 07/28/2020   Procedure: A/V FISTULAGRAM;  Surgeon: Marty Heck, MD;  Location: [place] [facility];  Service: Cardiovascular;  Laterality: Left;   AV FISTULA PLACEMENT Left 07/04/2020   Procedure: INSERTION OF LEFT ARTERIOVENOUS (AV) GORE-TEX GRAFT ARM (ACUSEAL);  Surgeon: Cherre Adis Sturgill, MD;  Location: [place];  Service: Vascular;  Laterality: Left;   CERVICAL BIOPSY  W/ LOOP ELECTRODE EXCISION  4098   CO2 LASER APPLICATION N/A 08/24/1476   Procedure: CO2 LASER APPLICATION;  Surgeon: Everitt Amber, MD;  Location: [place];  Service: Gynecology;  Laterality: N/A;   ORIF ANKLE FRACTURE Right 2012   per pt retained hardware   PERIPHERAL VASCULAR BALLOON ANGIOPLASTY Left 07/28/2020   Procedure: PERIPHERAL VASCULAR BALLOON ANGIOPLASTY;  Surgeon: Marty Heck, MD;  Location: [place] [facility];  Service: Cardiovascular;  Laterality: Left;  AVF   VULVA SURGERY  2013   excision    Allergies  Allergen Reactions   Baclofen Other (See Comments)  Caused intolerable dizziness    Gabapentin Other (See Comments)    Caused intolerable dizziness   Penicillins Other (See Comments)    Unknown childhood reaction     Prior to Admission medications   Medication Sig Start Date End Date Taking? Authorizing Provider  aluminum-magnesium hydroxide-simethicone (MAALOX) 485-462-70 MG/5ML SUSP Take 30 mLs by mouth 4 (four) times daily -  before meals and at bedtime. Patient taking differently: Take 30 mLs by mouth 4 (four) times daily as needed (gas/acid reflux). 07/21/21  Yes White, Adrienne R, NP  bictegravir-emtricitabine-tenofovir AF  (BIKTARVY) 50-200-25 MG TABS tablet Take 1 tablet by mouth daily. Patient taking differently: Take 1 tablet by mouth at bedtime. 05/23/21  Yes Mignon Pine, DO  ferric citrate (AURYXIA) 1 GM 210 MG(Fe) tablet Take 420 mg by mouth See admin instructions. Take 2 tablets (420 mg) by mouth once or twice daily with meals   Yes [provider]  multivitamin (RENA-VIT) TABS tablet Take 1 tablet by mouth at bedtime.   Yes [provider]  atovaquone (MEPRON) 750 MG/5ML suspension Take 10 mLs (1,500 mg total) by mouth daily. Patient not taking: Reported on 08/12/2021 05/23/21   Mignon Pine, DO    Social History   Socioeconomic History   Marital status: Single    Spouse name: Not on file   Number of children: 0   Years of education: college   Highest education level: Bachelor's degree (e.g., BA, AB, BS)  Occupational History   Occupation: Administrator, arts  Tobacco Use   Smoking status: Never   Smokeless tobacco: Never  Vaping Use   Vaping Use: Never used  Substance and Sexual Activity   Alcohol use: Not Currently   Drug use: Not Currently   Sexual activity: Not Currently    Birth control/protection: Post-menopausal  Other Topics Concern   Not on file  Social History Narrative   Lives at home with significant other.   Right-handed.   No daily caffeine use.   Social Determinants of Health   Financial Resource Strain: Not on file  Food Insecurity: Not on file  Transportation Needs: Not on file  Physical Activity: Not on file  Stress: Not on file  Social Connections: Not on file  Intimate Partner Violence: Not on file     Family History  Problem Relation Age of Onset   Diabetes Mother    Healthy Father    Breast cancer Maternal Aunt    Pancreatic cancer Maternal Grandfather    Colon cancer Neg Hx    Colon polyps Neg Hx    Esophageal cancer Neg Hx    Rectal cancer Neg Hx    Stomach cancer Neg Hx    Prostate cancer Neg Hx    Endometrial cancer Neg Hx     Ovarian cancer Neg Hx     ROS: Otherwise negative unless mentioned in HPI  Physical Examination  Vitals:   08/13/21 0430 08/13/21 0700  BP:  (!) 154/102  Pulse: (!) 102 92  Resp: 19 12  Temp:    SpO2: 97% 98%   Body mass index is 33.47 kg/m.  General:  WDWN in NAD Gait: Not observed HENT: WNL, normocephalic Pulmonary: normal non-labored breathing, without Rales, rhonchi,  wheezing Cardiac: regular rate Abdomen:  soft, NT/ND, no masses Skin: without rashes Vascular Exam/Pulses: 2+ radial pulses bilaterally Extremities: without ischemic changes, without Gangrene , without cellulitis; without open wounds;  Left arm forearm loop graft is occluded. Musculoskeletal: no muscle wasting or atrophy  Neurologic: A&O X 3;  No focal weakness or paresthesias are detected; speech is fluent/normal Psychiatric:  The pt has Normal affect. Lymph:  Unremarkable  CBC    Component Value Date/Time   WBC 4.8 08/13/2021 0500   RBC 3.58 (L) 08/13/2021 0500   HGB 12.1 08/13/2021 0500   HGB 14.1 12/09/2019 0954   HCT 36.2 08/13/2021 0500   HCT 41.3 12/09/2019 0954   PLT 178 08/13/2021 0500   MCV 101.1 (H) 08/13/2021 0500   MCV 86 12/09/2019 0954   MCH 33.8 08/13/2021 0500   MCHC 33.4 08/13/2021 0500   RDW 15.1 08/13/2021 0500   RDW 13.7 12/09/2019 0954   LYMPHSABS 1.1 08/13/2021 0500   LYMPHSABS 1.1 12/09/2019 0954   MONOABS 0.7 08/13/2021 0500   EOSABS 0.7 (H) 08/13/2021 0500   EOSABS 0.2 12/09/2019 0954   BASOSABS 0.0 08/13/2021 0500   BASOSABS 0.0 12/09/2019 0954    BMET    Component Value Date/Time   NA 138 08/13/2021 0500   NA 139 12/09/2019 0954   K 4.2 08/13/2021 0500   CL 111 08/13/2021 0500   CO2 17 (L) 08/13/2021 0500   GLUCOSE 79 08/13/2021 0500   BUN 52 (H) 08/13/2021 0500   BUN 21 12/15/2019 0848   CREATININE 6.03 (H) 08/13/2021 0500   CREATININE 7.36 (H) 05/04/2021 1001   CALCIUM 8.4 (L) 08/13/2021 0500   CALCIUM 7.0 (L) 06/30/2020 1459   GFRNONAA 8 (L)  08/13/2021 0500   GFRNONAA 12 (L) 08/19/2020 0000   GFRAA 14 (L) 08/19/2020 0000    COAGS: Lab Results  Component Value Date   INR 0.9 04/13/2020     Non-Invasive Vascular Imaging:   none  Statin:  No. Beta Blocker:  No. Aspirin:  No. ACEI:  No. ARB:  No. CCB use:  No Other antiplatelets/anticoagulants:  No.    ASSESSMENT/PLAN: This is a 46 y.o. female with end-stage renal disease requiring dialysis.  Current tunneled HD access is occluded.  Patient is in need of replacement.  I have discussed the risks and benefits of tunneled HD line removal, and replacement-likely in the left internal jugular vein, Brooke Baldwin elected to proceed.  I have the patient booked for tomorrow, should this change from a nephrology standpoint, I am happy to cancel her case. Please make n.p.o. at midnight   Cassandria Santee MD MS [facility] [phone] 08/13/2021  11:17 AM

## 2021-08-13 NOTE — Progress Notes (Signed)
PROGRESS NOTE  Brooke Baldwin OIN:[id number] DOB: 1976-06-02 DOA: 08/12/2021 PCP: Shanon Rosser, PA-C  Brief History   Brooke Baldwin is a 46 y.o. female with medical history significant of HIV, HTN ESRD  Presented with   cough 2 wks of cough and SOB, reports she has not been able to get HD for the past 3wk or more Reports she was unable to get off work as she works in the [employer] and currently it in a busy season.  Her work has prevented her from getting dialysis.   She still makes urine She is HIV positive and has been taking her medications  She has been followed by ID but cannot remember when was the last time she had her numbers checked. Denies any fever chills no nausea no vomiting or diarrhea. Reports her hemodialysis catheter always gives her trouble and when she does get dialyzed usually there is some problems with that such as intermittent clotting.  She She has had a graft in the past and failed. Patient states that the plan for her to undergo PD dialysis placement in the future so that she does not have to take time off of work so that she can get her  dialysis done  [facility] were consulted to admit the patient for further evaluation and treatment.   Vascular surgery and nephrology have been consulted. Plans are for replacement of her dialysis catheter tomorrow. She will be made NPO after midnight.  Consultants  Vascular surgery Nephrology  Procedures    Antibiotics   Anti-infectives (From admission, onward)    Start     Dose/Rate Route Frequency Ordered Stop   08/13/21 0100  bictegravir-emtricitabine-tenofovir AF (BIKTARVY) 50-200-25 MG per tablet 1 tablet        1 tablet Oral Daily at bedtime 08/13/21 0013         Subjective  The patient is resting comfortably. She is complaining of a dry cough.  Objective   Vitals:  Vitals:   08/13/21 1500 08/13/21 1716  BP: (!) 146/126 (!) 142/104  Pulse: (!) 104 83  Resp: 14 18  Temp:  (!)  97.5 F (36.4 C)  SpO2: 95% 99%    Exam:  Constitutional:  The patient is awake, alert, and oriented x 3. No acute distress. Respiratory:  No increased work of breathing. No wheezes, rales, or rhonchi No tactile fremitus Cardiovascular:  Regular rate and rhythm No murmurs, ectopy, or gallups. No lateral PMI. No thrills. Abdomen:  Abdomen is soft, non-tender, non-distended No hernias, masses, or organomegaly Normoactive bowel sounds.  Musculoskeletal:  No cyanosis, clubbing, or edema Skin:  No rashes, lesions, ulcers palpation of skin: no induration or nodules Neurologic:  CN 2-12 intact Sensation all 4 extremities intact Psychiatric:  Mental status Mood, affect appropriate Orientation to person, place, time  judgment and insight appear intact  I have personally reviewed the following:   Today's Data  Vitals  Lab Data  CMP CBC  Micro Data    Imaging  CXR Soft tissue of the neck  Cardiology Data  EKG  Other Data    Scheduled Meds:  bictegravir-emtricitabine-tenofovir AF  1 tablet Oral QHS   Chlorhexidine Gluconate Cloth  6 each Topical Q0600   Chlorhexidine Gluconate Cloth  6 each Topical Q0600   ferric citrate  420 mg Oral BID WC   fluticasone  2 spray Each Nare Daily   multivitamin  1 tablet Oral QHS   sodium chloride flush  3 mL Intravenous Q12H  sodium zirconium cyclosilicate  10 g Oral Once   Continuous Infusions:  sodium chloride      Principal Problem:   Hyperkalemia Active Problems:   HIV (human immunodeficiency virus infection) (HCC)   End stage renal disease ([place])   Secondary hyperparathyroidism ([place])   Hypertension   LOS: 0 days   A & P  End stage renal disease [facility]) Nephrology been paged by ED provider plan to attempt to hemodialyzed in a.m. Reportedly patient already had 2 hours of hemodialysis today  Malfunctioning of dialysis access: The patient has been evaluated by Vascular Sx and will be have her current dialysis  access removed and new access placed tomorrow.  Cough: The patient is complaining of a dry cough of about 2-3 weeks duration. Her lung sounds are clear and CXR demonstrates no honeycombing or infiltrates to indicate an infectious or bronchitic process. The patient denies GERD symptoms. She does say that she has some sinus symptoms related to allergies. I will start her on flonase and see if it improved her cough.   Hyperkalemia At the hemodialysis and treatment in ER latest potassium down to 4.9 we will repeat and follow   Secondary hyperparathyroidism ([place]) Continue home medications continue home medications   HIV (human immunodeficiency virus infection) ([place]) Continue home medications Recheck CD4 count and HIV viral load        Lab Results  Component Value Date    CD4TABS 81 (L) 05/04/2021    CD4TABS 42 (L) 12/21/2020    CD4TABS <35 (L) 07/01/2020         Lab Results  Component Value Date    HIV1RNAQUANT Not Detected 05/04/2021    Hypertension Patient not taking any medications at home.  Continue to monitor blood pressure    Cough mild - CXR wnl   Neck calcification - unclear significance work up as an outpt  I have seen and examined this patient myself. I have spent 35 minutes in her evaluation and care.   DVT Prophylaxis: SCD's CODE STATUS: Full Code Family Communication: None available Disposition: Home  Nusayba Cadenas, DO [facility] Direct contact: see [email or web address]  7PM-7AM contact night coverage as above 08/13/2021, 6:15 PM  LOS: 0 days

## 2021-08-14 ENCOUNTER — Observation Stay (HOSPITAL_COMMUNITY): Payer: POS | Admitting: Anesthesiology

## 2021-08-14 ENCOUNTER — Observation Stay (HOSPITAL_COMMUNITY): Payer: POS

## 2021-08-14 ENCOUNTER — Encounter (HOSPITAL_COMMUNITY)
Admission: EM | Disposition: A | Discharge status: Home / Self Care | Payer: Self-pay | Source: Home / Self Care | Attending: Emergency Medicine

## 2021-08-14 DIAGNOSIS — T8241XA Breakdown (mechanical) of vascular dialysis catheter, initial encounter: Secondary | ICD-10-CM | POA: Diagnosis not present

## 2021-08-14 DIAGNOSIS — E875 Hyperkalemia: Secondary | ICD-10-CM | POA: Diagnosis not present

## 2021-08-14 HISTORY — PX: INSERTION OF DIALYSIS CATHETER: SHX1324

## 2021-08-14 LAB — BASIC METABOLIC PANEL
Anion gap: 12 (ref 5–15)
BUN: 56 mg/dL — ABNORMAL HIGH (ref 6–20)
CO2: 18 mmol/L — ABNORMAL LOW (ref 22–32)
Calcium: 9.9 mg/dL (ref 8.9–10.3)
Chloride: 107 mmol/L (ref 98–111)
Creatinine, Ser: 6.82 mg/dL — ABNORMAL HIGH (ref 0.44–1.00)
GFR, Estimated: 7 mL/min — ABNORMAL LOW (ref >60)
Glucose, Bld: 91 mg/dL (ref 70–99)
Potassium: 4.6 mmol/L (ref 3.5–5.1)
Sodium: 137 mmol/L (ref 135–145)

## 2021-08-14 LAB — SURGICAL PCR SCREEN
MRSA, PCR: NEGATIVE
Staphylococcus aureus: POSITIVE — AB

## 2021-08-14 LAB — T-HELPER CELLS (CD4) COUNT (NOT AT ARMC)
CD4 % Helper T Cell: 10 % — ABNORMAL LOW (ref 33–65)
CD4 T Cell Abs: 107 /uL — ABNORMAL LOW (ref 400–1790)

## 2021-08-14 SURGERY — INSERTION OF DIALYSIS CATHETER
Anesthesia: General

## 2021-08-14 MED ORDER — LIDOCAINE HCL 1 % IJ SOLN: INTRAMUSCULAR | Status: DC | PRN

## 2021-08-14 MED ORDER — FENTANYL CITRATE (PF) 250 MCG/5ML IJ SOLN
INTRAMUSCULAR | Status: AC
  Filled 2021-08-14: qty 5

## 2021-08-14 MED ORDER — HEPARIN SODIUM (PORCINE) 1000 UNIT/ML IJ SOLN
INTRAMUSCULAR | Status: DC | PRN
  Administered 2021-08-14: 4000 [IU]

## 2021-08-14 MED ORDER — DEXAMETHASONE SODIUM PHOSPHATE 10 MG/ML IJ SOLN
INTRAMUSCULAR | Status: DC | PRN
  Administered 2021-08-14: 5 mg via INTRAVENOUS

## 2021-08-14 MED ORDER — FLUTICASONE PROPIONATE 50 MCG/ACT NA SUSP: 2.0000 | Freq: Every day | NASAL | 0 refills | Status: DC

## 2021-08-14 MED ORDER — SUCCINYLCHOLINE CHLORIDE 200 MG/10ML IV SOSY
PREFILLED_SYRINGE | INTRAVENOUS | Status: DC | PRN
Start: 2021-08-14 — End: 2021-08-14
  Administered 2021-08-14: 120 mg via INTRAVENOUS

## 2021-08-14 MED ORDER — PHENYLEPHRINE HCL-NACL 20-0.9 MG/250ML-% IV SOLN
INTRAVENOUS | Status: DC | PRN
  Administered 2021-08-14: 30 ug/min via INTRAVENOUS

## 2021-08-14 MED ORDER — CHLORHEXIDINE GLUCONATE 0.12 % MT SOLN
15.0000 mL | Freq: Once | OROMUCOSAL | Status: AC
  Administered 2021-08-14: 15 mL via OROMUCOSAL

## 2021-08-14 MED ORDER — VANCOMYCIN HCL IN DEXTROSE 1-5 GM/200ML-% IV SOLN
INTRAVENOUS | Status: AC
  Administered 2021-08-14: 1000 mg via INTRAVENOUS
  Filled 2021-08-14: qty 200

## 2021-08-14 MED ORDER — ROCURONIUM BROMIDE 10 MG/ML (PF) SYRINGE
PREFILLED_SYRINGE | INTRAVENOUS | Status: DC | PRN
  Administered 2021-08-14: 30 mg via INTRAVENOUS

## 2021-08-14 MED ORDER — VANCOMYCIN HCL IN DEXTROSE 1-5 GM/200ML-% IV SOLN: 1000.0000 mg | Freq: Once | INTRAVENOUS | Status: AC

## 2021-08-14 MED ORDER — SODIUM CHLORIDE 0.9 % IV SOLN: INTRAVENOUS | Status: DC

## 2021-08-14 MED ORDER — PHENYLEPHRINE 40 MCG/ML (10ML) SYRINGE FOR IV PUSH (FOR BLOOD PRESSURE SUPPORT)
PREFILLED_SYRINGE | INTRAVENOUS | Status: DC | PRN
  Administered 2021-08-14 (×2): 80 ug via INTRAVENOUS
  Administered 2021-08-14: 120 ug via INTRAVENOUS

## 2021-08-14 MED ORDER — PROPOFOL 10 MG/ML IV BOLUS
INTRAVENOUS | Status: AC
  Filled 2021-08-14: qty 20

## 2021-08-14 MED ORDER — PROPOFOL 10 MG/ML IV BOLUS
INTRAVENOUS | Status: DC | PRN
Start: 2021-08-14 — End: 2021-08-14
  Administered 2021-08-14: 150 mg via INTRAVENOUS

## 2021-08-14 MED ORDER — MIDAZOLAM HCL 2 MG/2ML IJ SOLN
INTRAMUSCULAR | Status: AC
  Filled 2021-08-14: qty 2

## 2021-08-14 MED ORDER — OXYCODONE HCL 5 MG/5ML PO SOLN: 5.0000 mg | Freq: Once | ORAL | Status: DC | PRN

## 2021-08-14 MED ORDER — ATOVAQUONE 750 MG/5ML PO SUSP
1500.0000 mg | Freq: Every day | ORAL | Status: DC
  Filled 2021-08-14 (×2): qty 10

## 2021-08-14 MED ORDER — MIDAZOLAM HCL 2 MG/2ML IJ SOLN
INTRAMUSCULAR | Status: DC | PRN
  Administered 2021-08-14: 2 mg via INTRAVENOUS

## 2021-08-14 MED ORDER — ALBUTEROL SULFATE HFA 108 (90 BASE) MCG/ACT IN AERS
INHALATION_SPRAY | RESPIRATORY_TRACT | Status: DC | PRN
  Administered 2021-08-14: 4 via RESPIRATORY_TRACT

## 2021-08-14 MED ORDER — ORAL CARE MOUTH RINSE: 15.0000 mL | Freq: Once | OROMUCOSAL | Status: AC

## 2021-08-14 MED ORDER — OXYCODONE-ACETAMINOPHEN 5-325 MG PO TABS: 1.0000 | ORAL_TABLET | ORAL | 0 refills | Status: DC | PRN

## 2021-08-14 MED ORDER — 0.9 % SODIUM CHLORIDE (POUR BTL) OPTIME
TOPICAL | Status: DC | PRN
  Administered 2021-08-14: 1000 mL

## 2021-08-14 MED ORDER — ONDANSETRON HCL 4 MG/2ML IJ SOLN
INTRAMUSCULAR | Status: DC | PRN
  Administered 2021-08-14: 4 mg via INTRAVENOUS

## 2021-08-14 MED ORDER — HEPARIN 6000 UNIT IRRIGATION SOLUTION
Status: DC | PRN
Start: 2021-08-14 — End: 2021-08-14
  Administered 2021-08-14: 1

## 2021-08-14 MED ORDER — HYDROMORPHONE HCL 1 MG/ML IJ SOLN: 0.2500 mg | INTRAMUSCULAR | Status: DC | PRN

## 2021-08-14 MED ORDER — SUGAMMADEX SODIUM 200 MG/2ML IV SOLN
INTRAVENOUS | Status: DC | PRN
Start: 2021-08-14 — End: 2021-08-14
  Administered 2021-08-14: 200 mg via INTRAVENOUS

## 2021-08-14 MED ORDER — AMISULPRIDE (ANTIEMETIC) 5 MG/2ML IV SOLN: 10.0000 mg | Freq: Once | INTRAVENOUS | Status: DC | PRN

## 2021-08-14 MED ORDER — MUPIROCIN 2 % EX OINT: 1.0000 "application " | TOPICAL_OINTMENT | Freq: Two times a day (BID) | CUTANEOUS | 0 refills | Status: DC

## 2021-08-14 MED ORDER — LIDOCAINE 2% (20 MG/ML) 5 ML SYRINGE
INTRAMUSCULAR | Status: DC | PRN
Start: 2021-08-14 — End: 2021-08-14
  Administered 2021-08-14: 60 mg via INTRAVENOUS

## 2021-08-14 MED ORDER — OXYCODONE HCL 5 MG PO TABS: 5.0000 mg | ORAL_TABLET | Freq: Once | ORAL | Status: DC | PRN

## 2021-08-14 MED ORDER — MUPIROCIN 2 % EX OINT
1.0000 "application " | TOPICAL_OINTMENT | Freq: Two times a day (BID) | CUTANEOUS | Status: DC
  Administered 2021-08-14: 1 via NASAL
  Filled 2021-08-14: qty 22

## 2021-08-14 MED ORDER — HEPARIN SODIUM (PORCINE) 1000 UNIT/ML IJ SOLN
INTRAMUSCULAR | Status: AC
  Filled 2021-08-14: qty 4

## 2021-08-14 MED ORDER — OXYCODONE-ACETAMINOPHEN 5-325 MG PO TABS: 1.0000 | ORAL_TABLET | ORAL | Status: DC | PRN

## 2021-08-14 MED ORDER — FENTANYL CITRATE (PF) 250 MCG/5ML IJ SOLN
INTRAMUSCULAR | Status: DC | PRN
  Administered 2021-08-14 (×2): 50 ug via INTRAVENOUS

## 2021-08-14 SURGICAL SUPPLY — 43 items
BAG COUNTER SPONGE SURGICOUNT (BAG) ×2 IMPLANT
BAG DECANTER FOR FLEXI CONT (MISCELLANEOUS) ×2 IMPLANT
BIOPATCH RED 1 DISK 7.0 (GAUZE/BANDAGES/DRESSINGS) ×2 IMPLANT
CATH PALINDROME-P 19CM W/VT (CATHETERS) IMPLANT
CATH PALINDROME-P 23CM W/VT (CATHETERS) ×1 IMPLANT
CATH PALINDROME-P 28CM W/VT (CATHETERS) IMPLANT
CATH STRAIGHT 5FR 65CM (CATHETERS) IMPLANT
COVER PROBE W GEL 5X96 (DRAPES) ×2 IMPLANT
COVER SURGICAL LIGHT HANDLE (MISCELLANEOUS) ×2 IMPLANT
DECANTER SPIKE VIAL GLASS SM (MISCELLANEOUS) ×2 IMPLANT
DERMABOND ADVANCED (GAUZE/BANDAGES/DRESSINGS) ×1
DERMABOND ADVANCED .7 DNX12 (GAUZE/BANDAGES/DRESSINGS) ×1 IMPLANT
DRAPE C-ARM 42X72 X-RAY (DRAPES) ×2 IMPLANT
DRAPE CHEST BREAST 15X10 FENES (DRAPES) ×2 IMPLANT
GAUZE 4X4 16PLY ~~LOC~~+RFID DBL (SPONGE) ×2 IMPLANT
GLOVE SRG 8 PF TXTR STRL LF DI (GLOVE) ×2 IMPLANT
GLOVE SURG POLYISO LF SZ8 (GLOVE) IMPLANT
GLOVE SURG UNDER POLY LF SZ8 (GLOVE) ×2
GOWN STRL REUS W/ TWL LRG LVL3 (GOWN DISPOSABLE) ×2 IMPLANT
GOWN STRL REUS W/TWL 2XL LVL3 (GOWN DISPOSABLE) ×2 IMPLANT
GOWN STRL REUS W/TWL LRG LVL3 (GOWN DISPOSABLE) ×2
KIT BASIN OR (CUSTOM PROCEDURE TRAY) ×2 IMPLANT
KIT PALINDROME-P 55CM (CATHETERS) IMPLANT
KIT TURNOVER KIT B (KITS) ×2 IMPLANT
NDL 18GX1X1/2 (RX/OR ONLY) (NEEDLE) ×1 IMPLANT
NDL HYPO 25GX1X1/2 BEV (NEEDLE) ×1 IMPLANT
NEEDLE 18GX1X1/2 (RX/OR ONLY) (NEEDLE) ×2 IMPLANT
NEEDLE HYPO 25GX1X1/2 BEV (NEEDLE) ×2 IMPLANT
NS IRRIG 1000ML POUR BTL (IV SOLUTION) ×2 IMPLANT
PACK SURGICAL SETUP 50X90 (CUSTOM PROCEDURE TRAY) ×2 IMPLANT
PAD ARMBOARD 7.5X6 YLW CONV (MISCELLANEOUS) ×4 IMPLANT
SET MICROPUNCTURE 5F STIFF (MISCELLANEOUS) ×1 IMPLANT
SOAP 2 % CHG 4 OZ (WOUND CARE) ×2 IMPLANT
SUT ETHILON 3 0 PS 1 (SUTURE) ×2 IMPLANT
SUT MNCRL AB 4-0 PS2 18 (SUTURE) ×2 IMPLANT
SYR 10ML LL (SYRINGE) ×2 IMPLANT
SYR 20ML LL LF (SYRINGE) ×4 IMPLANT
SYR 5ML LL (SYRINGE) ×2 IMPLANT
SYR CONTROL 10ML LL (SYRINGE) ×2 IMPLANT
TOWEL GREEN STERILE (TOWEL DISPOSABLE) ×2 IMPLANT
TOWEL GREEN STERILE FF (TOWEL DISPOSABLE) ×2 IMPLANT
WATER STERILE IRR 1000ML POUR (IV SOLUTION) ×2 IMPLANT
WIRE AMPLATZ SS-J .035X180CM (WIRE) IMPLANT

## 2021-08-14 NOTE — Progress Notes (Signed)
[facility] Progress Note   Subjective: Seen in room. TDC placed today per Dr. Scot Dock. HD ordered for tonight  Objective Vitals:   08/14/21 1245 08/14/21 1255 08/14/21 1300 08/14/21 1315  BP: 128/87  109/89 124/88  Pulse: 93 98 96 89  Resp: 16 (!) 22 18 20   Temp: (!) 97.3 F (36.3 C)   (!) 97.5 F (36.4 C)  TempSrc:      SpO2: 98% 97% 96% 98%  Weight:      Height:       General: well appearing female in NAD Heart: RRR. No murmur, rubs or gallops.  Lungs: CTAB A/P Abdomen: soft, nontender, +BS, no guarding, no rebound tenderness M/S:  Equal strength b/l in upper and lower extremities.  Lower extremities:no edema, ischemic changes, or open wounds  Neuro: AAOx3. Moves all extremities spontaneously. Dialysis Access: new RIJ TDC drsg intact.    Additional Objective Labs: Basic Metabolic Panel: Recent Labs  Lab 08/12/21 1350 08/12/21 2032 08/13/21 0500 08/14/21 0549  NA 141 142 138 137  K 4.9 4.8 4.2 4.6  CL 118* 99 111 107  CO2 15* 27 17* 18*  GLUCOSE 90 163* 79 91  BUN 64* 48* 52* 56*  CREATININE 7.28* 6.48* 6.03* 6.82*  CALCIUM 9.5 10.2 8.4* 9.9  PHOS 4.3  --  5.2*  --    Liver Function Tests: Recent Labs  Lab 08/12/21 1114 08/12/21 1350 08/13/21 0500  AST 65*  --  33  ALT 47*  --  37  ALKPHOS 115  --  114  BILITOT 1.1  --  0.5  PROT 7.4  --  7.0  ALBUMIN 3.3* 3.4* 3.1*   No results for input(s): LIPASE, AMYLASE in the last 168 hours. CBC: Recent Labs  Lab 08/12/21 1114 08/12/21 1211 08/12/21 1350 08/13/21 0500  WBC 4.5  --  5.0 4.8  NEUTROABS 2.4  --   --  2.3  HGB 12.8 13.6 12.7 12.1  HCT 39.9 40.0 39.2 36.2  MCV 104.5*  --  103.7* 101.1*  PLT 201  --  204 178   Blood Culture    Component Value Date/Time   SDES URINE, CLEAN CATCH 07/21/2021 2009   [place] NONE 07/21/2021 2009   CULT (A) 07/21/2021 2009    <10,000 COLONIES/mL INSIGNIFICANT GROWTH Performed at [facility], [place] [address] [place], [place]  [address]    REPTSTATUS 07/23/2021 FINAL 07/21/2021 2009    Cardiac Enzymes: No results for input(s): CKTOTAL, CKMB, CKMBINDEX, TROPONINI in the last 168 hours. CBG: No results for input(s): GLUCAP in the last 168 hours. Iron Studies: No results for input(s): IRON, TIBC, TRANSFERRIN, FERRITIN in the last 72 hours. @lablastinr3 @ Studies/Results: DG Chest Port 1 View  Result Date: 08/14/2021 CLINICAL DATA:  End-stage renal disease. Postop HD catheter placement. EXAM: PORTABLE CHEST 1 VIEW COMPARISON:  08/12/2021 FINDINGS: Right jugular dialysis catheter has been replaced. New catheter tip is at the superior cavoatrial junction and appears to be appropriately position. Negative for pneumothorax. Lungs are clear. Heart size is within normal limits. IMPRESSION: Right jugular dialysis catheter with the tip at the superior cavoatrial junction. Electronically Signed   By: Markus Daft M.D.   On: 08/14/2021 13:06   DG C-Arm 1-60 Min-No Report  Result Date: 08/14/2021 Fluoroscopy was utilized by the requesting physician.  No radiographic interpretation.   Medications:  sodium chloride      atovaquone  1,500 mg Oral Q breakfast   bictegravir-emtricitabine-tenofovir AF  1 tablet Oral  QHS   Chlorhexidine Gluconate Cloth  6 each Topical Q0600   Chlorhexidine Gluconate Cloth  6 each Topical Q0600   ferric citrate  420 mg Oral BID WC   fluticasone  2 spray Each Nare Daily   multivitamin  1 tablet Oral QHS   mupirocin ointment  1 application Nasal BID   sodium chloride flush  3 mL Intravenous Q12H     TTS - [facility]  4 hrs 47min, BFR 400, DFR AF 1.5,  EDW 81kg, 2K/ 2.5Ca   Access: TDC  Heparin 3000 unit bolus Calcitriol 0.71mcg PO qHD     Assessment/Plan:  Non compliance with dialysis - last outpatient dialysis on 07/06/22.  Issues with work causing her to miss HD.  Reinforced importance of compliance.  Will contact outpatient SW to see if able to assist with work issues.  Hyperkalemia - 2/2 non  compliance with HD and dietary indiscretions.  Reviewed low K diet. Improved after dialysis/lokelma.    ESRD -  on HD TTS.  Shortened HD yesterday d/t clotted access.  Plans for new [facility] placement tomorrow with HD to follow. Malfunctioning TDC - new TDC to be placed tomorrow by [facility]. Appreciate their assistance. Discussed care of [facility] and risks of not having dressing over [place]. New TDC placed today per Dr.   Hypertension/volume  - Blood pressure well controlled, continue home meds. Does not appear volume overloaded.  UF as tolerated.    Anemia of CKD - Hgb 12.1. No indication for ESA.  Secondary Hyperparathyroidism -  Ca and phos in goal.  Continue VDRA.   Nutrition - Renal diet w/fluid restrictions.  HIV  Disposition: Home tonight after HD  Kenneth Cuaresma H. Trishia Cuthrell NP-C 08/14/2021, 2:32 PM  [employer] [phone]

## 2021-08-14 NOTE — Anesthesia Preprocedure Evaluation (Addendum)
Anesthesia Evaluation  Patient identified by MRN, date of birth, ID band Patient awake    Reviewed: Allergy & Precautions, NPO status , Patient's Chart, lab work & pertinent test results  Airway Mallampati: II  TM Distance: >3 FB Neck ROM: Full    Dental no notable dental hx. (+) Teeth Intact, Dental Advisory Given   Pulmonary  Has had dry cough for about 2 weeks, has tested negative for viral illnesses multiple times. Pt thinks it might be allergies    Pulmonary exam normal breath sounds clear to auscultation       Cardiovascular hypertension (no meds, 137/91 in preop ), Normal cardiovascular exam Rhythm:Regular Rate:Normal     Neuro/Psych  Neuromuscular disease (trigeminal neuralgia) negative psych ROS   GI/Hepatic negative GI ROS, Neg liver ROS,   Endo/Other  negative endocrine ROS  Renal/GU ESRF and DialysisRenal disease (HD t/th/sat)Was dialyzed on Saturday but only partially d/t catheter malfunction- prior to that, was last dialyzed in early December; has had scheduling issue w/ dialysis center  negative genitourinary   Musculoskeletal negative musculoskeletal ROS (+)   Abdominal   Peds  Hematology  (+) HIV, hct 36.2   Anesthesia Other Findings   Reproductive/Obstetrics negative OB ROS                           Anesthesia Physical Anesthesia Plan  ASA: 3  Anesthesia Plan: General   Post-op Pain Management:    Induction: Intravenous  PONV Risk Score and Plan: Ondansetron, Dexamethasone, Midazolam and Treatment may vary due to age or medical condition  Airway Management Planned: Oral ETT  Additional Equipment: None  Intra-op Plan:   Post-operative Plan: Extubation in OR  Informed Consent: I have reviewed the patients History and Physical, chart, labs and discussed the procedure including the risks, benefits and alternatives for the proposed anesthesia with the patient or  authorized representative who has indicated his/her understanding and acceptance.     Dental advisory given  Plan Discussed with: CRNA  Anesthesia Plan Comments:       Anesthesia Quick Evaluation

## 2021-08-14 NOTE — Op Note (Signed)
° ° °  NAME: Brooke Baldwin    MRN: [id number] DOB: 24-Dec-1975    DATE OF OPERATION: 08/14/2021  PREOP DIAGNOSIS:    End-stage renal disease with nonfunctioning right IJ tunneled dialysis catheter  POSTOP DIAGNOSIS:    Same  PROCEDURE:    Removal of tunneled right IJ tunneled dialysis catheter Placement of new right tunneled IJ dialysis catheter under ultrasound guidance (23 cm)  SURGEON: Judeth Cornfield. Scot Dock, MD  ASSIST: None  ANESTHESIA: General  EBL: Minimal  INDICATIONS:    Brooke Baldwin is a 46 y.o. female who had a nonfunctioning right IJ tunneled dialysis catheter.  She presents for new catheter.  FINDINGS:   I looked at the left IJ with the SonoSite and there were multiple collaterals suggesting an obstruction.  The air therefore I went back to the right side.  TECHNIQUE:   The neck and upper chest were prepped and draped in usual sterile fashion.  Patient had received a general anesthetic.  A small incision was made over the entrance site to the catheter above the clavicle and the catheter here was dissected free clamped and divided.  The distal aspect of the catheter was removed by dissecting free the cuff.  I then advanced the wire through the catheter and positioned in the inferior vena cava.  The exit site for the new catheter was selected and the catheter was brought through the tunnel.  The tract over the wire was dilated and then the dilator and peel-away sheath were advanced over the wire and the wire and dilator removed.  The cath was passed through the peel-away sheath and positioned at the cavoatrial junction.  Both ports withdrew easily were then flushed with heparinized saline and filled with concentrated heparin.  The catheter was secured at its exit site with a 3-0 nylon suture.  The IJ cannulation site was closed with 4-0 Monocryl.  Sterile dressing was applied.  The patient tolerated the procedure well.  She was transferred to the recovery room  in stable condition.  All needle and sponge counts were correct.  [name], MD, FACS [facility]  DATE OF DICTATION:   08/14/2021

## 2021-08-14 NOTE — Discharge Summary (Signed)
Physician Discharge Summary  Brooke Baldwin BZJ:[id number] DOB: 06-14-1976 DOA: 08/12/2021  PCP: Shanon Rosser, PA-C  Admit date: 08/12/2021 Discharge date: 08/14/2021  Recommendations for Outpatient Follow-up:  Discharge to home after 1st HD completed.  Keep appointments with outpatient HD. Follow up with nephrology as directed. Follow up with PCP in 7-10 days.   Follow-up Information     Dialysis on Tuesday .          [facility] Follow up.   Why: Schedule is Tuesday, Thursday, Saturday. Arrive at 11:40 for 12:00 chair time. Contact information: [address] [place] [address] [phone]                  Discharge Diagnoses: Principal diagnosis is #1 Malfunctioning dialysis access - s/p replacement End stage renal disease Cough Hyperkalemia Secondary hyperparathyroidism HIV  Discharge Condition: Fair  Disposition: Home  Diet recommendation: Renal diet  Filed Weights   08/13/21 0746 08/13/21 1716 08/14/21 1108  Weight: 83 kg 88.1 kg 86.2 kg    History of present illness:  Brooke Baldwin is a 46 y.o. female with medical history significant of HIV, HTN ESRD  Presented with   cough 2 wks of cough and SOB, reports she has not been able to get HD for the past 3wk or more Reports she was unable to get off work as she works in the [employer] and currently it in a busy season.  Her work has prevented her from getting dialysis.   She still makes urine She is HIV positive and has been taking her medications  She has been followed by ID but cannot remember when was the last time she had her numbers checked. Denies any fever chills no nausea no vomiting or diarrhea. Reports her hemodialysis catheter always gives her trouble and when she does get dialyzed usually there is some problems with that such as intermittent clotting.  She She has had a graft in the past and failed. Patient states that the plan for her to undergo PD  dialysis placement in the future so that she does not have to take time off of work so that she can get her  dialysis done  Hospital Course:  Brooke Baldwin is a 46 y.o. female with medical history significant of HIV, HTN ESRD   Presented with   cough 2 wks of cough and SOB, reports she has not been able to get HD for the past 3wk or more Reports she was unable to get off work as she works in the [employer] and currently it in a busy season.  Her work has prevented her from getting dialysis.   She still makes urine She is HIV positive and has been taking her medications  She has been followed by ID but cannot remember when was the last time she had her numbers checked. Denies any fever chills no nausea no vomiting or diarrhea. Reports her hemodialysis catheter always gives her trouble and when she does get dialyzed usually there is some problems with that such as intermittent clotting.  She She has had a graft in the past and failed. Patient states that the plan for her to undergo PD dialysis placement in the future so that she does not have to take time off of work so that she can get her  dialysis done   [facility] were consulted to admit the patient for further evaluation and treatment.    Vascular surgery and nephrology have been consulted.  The patient underwent removal of right IJ tunneled dialysis catheter and placement of a new right tunneled IJ dialysis catheter with vascular surgery today under ultrasound guidance. She has gone for dialysis following the procedure.  She will be discharged to home this evening after she returns from dialysis. She has been set up to return to her regular dialysis schedule tomorrow.  Today's assessment: S: The patient is resting quietly. No new complaints.  O: Vitals:  Vitals:   08/14/21 1315 08/14/21 1747  BP: 124/88 (!) 143/92  Pulse: 89 96  Resp: 20 19  Temp: (!) 97.5 F (36.4 C) 98 F (36.7 C)  SpO2: 98% 100%    Exam:  Constitutional:  The patient is awake, alert, and oriented x 3. No acute distress. Respiratory:  No increased work of breathing. No wheezes, rales, or rhonchi No tactile fremitus Cardiovascular:  Regular rate and rhythm No murmurs, ectopy, or gallups. No lateral PMI. No thrills. Abdomen:  Abdomen is soft, non-tender, non-distended No hernias, masses, or organomegaly Normoactive bowel sounds.  Musculoskeletal:  No cyanosis, clubbing, or edema Skin:  No rashes, lesions, ulcers palpation of skin: no induration or nodules Neurologic:  CN 2-12 intact Sensation all 4 extremities intact Psychiatric:  Mental status Mood, affect appropriate Orientation to person, place, time  judgment and insight appear intact   Discharge Instructions  Discharge Instructions     Activity as tolerated - No restrictions   Complete by: As directed    Call MD for:  redness, tenderness, or signs of infection (pain, swelling, redness, odor or green/yellow discharge around incision site)   Complete by: As directed    Call MD for:  temperature >100.4   Complete by: As directed    Diet - low sodium heart healthy   Complete by: As directed    Discharge instructions   Complete by: As directed    Discharge to home after 1st HD completed.  Keep appointments with outpatient HD. Follow up with nephrology as directed. Follow up with PCP in 7-10 days.   Discharge wound care:   Complete by: As directed    As per vascular surgery instructions.   Increase activity slowly   Complete by: As directed       Allergies as of 08/14/2021       Reactions   Baclofen Other (See Comments)   Caused intolerable dizziness    Gabapentin Other (See Comments)   Caused intolerable dizziness   Penicillins Other (See Comments)   Unknown childhood reaction        Medication List     TAKE these medications    aluminum-magnesium hydroxide-simethicone 200-200-20 MG/5ML Susp Commonly known as:  MAALOX Take 30 mLs by mouth 4 (four) times daily -  before meals and at bedtime. What changed:  when to take this reasons to take this   atovaquone 750 MG/5ML suspension Commonly known as: MEPRON Take 10 mLs (1,500 mg total) by mouth daily.   Auryxia 1 GM 210 MG(Fe) tablet Generic drug: ferric citrate Take 420 mg by mouth See admin instructions. Take 2 tablets (420 mg) by mouth once or twice daily with meals   Biktarvy 50-200-25 MG Tabs tablet Generic drug: bictegravir-emtricitabine-tenofovir AF Take 1 tablet by mouth daily. What changed: when to take this   fluticasone 50 MCG/ACT nasal spray Commonly known as: FLONASE Place 2 sprays into both nostrils daily.   multivitamin Tabs tablet Take 1 tablet by mouth at bedtime.   mupirocin ointment 2 % Commonly known as:  BACTROBAN Place 1 application into the nose 2 (two) times daily.   oxyCODONE-acetaminophen 5-325 MG tablet Commonly known as: PERCOCET/ROXICET Take 1 tablet by mouth every 4 (four) hours as needed for severe pain.               Discharge Care Instructions  (From admission, onward)           Start     Ordered   08/14/21 0000  Discharge wound care:       Comments: As per vascular surgery instructions.   08/14/21 1645           Allergies  Allergen Reactions   Baclofen Other (See Comments)    Caused intolerable dizziness    Gabapentin Other (See Comments)    Caused intolerable dizziness   Penicillins Other (See Comments)    Unknown childhood reaction     The results of significant diagnostics from this hospitalization (including imaging, microbiology, ancillary and laboratory) are listed below for reference.    Significant Diagnostic Studies: DG Neck Soft Tissue  Result Date: 08/12/2021 CLINICAL DATA:  Evaluate possible foreign body in neck. EXAM: NECK SOFT TISSUES - 1+ VIEW COMPARISON:  None. FINDINGS: The peripherally calcified structure in the left neck is to the left of the airway on  the frontal view. The exact location is unclear. This could be vascular in origin. A calcified thyroid nodule could have a similar appearance as could a calcified lymph node. I doubt this is a true foreign body. The soft tissues of the neck are otherwise normal. Calcifications in the neck bilaterally are consistent with carotid calcifications. No other abnormalities. IMPRESSION: The peripherally calcified structure at the base of the neck on the left near the thoracic inlet does not appear to be within the airway and is favored to represent a vascular calcification, calcified thyroid nodule, or calcified lymph node. A CT scan could best evaluate this structure. A CT scan of the chest, with contrast, including the base of the neck should be sufficient. Electronically Signed   By: Dorise Bullion III M.D.   On: 08/12/2021 13:09   DG Chest Port 1 View  Result Date: 08/14/2021 CLINICAL DATA:  End-stage renal disease. Postop HD catheter placement. EXAM: PORTABLE CHEST 1 VIEW COMPARISON:  08/12/2021 FINDINGS: Right jugular dialysis catheter has been replaced. New catheter tip is at the superior cavoatrial junction and appears to be appropriately position. Negative for pneumothorax. Lungs are clear. Heart size is within normal limits. IMPRESSION: Right jugular dialysis catheter with the tip at the superior cavoatrial junction. Electronically Signed   By: Markus Daft M.D.   On: 08/14/2021 13:06   DG Chest Port 1 View  Result Date: 08/12/2021 CLINICAL DATA:  Cough for 2 weeks. EXAM: PORTABLE CHEST 1 VIEW COMPARISON:  06/30/2020 FINDINGS: Right chest wall dialysis catheter is identified with tip projecting over the SVC. Heart size and mediastinal contours are normal. No pleural effusion or edema. No airspace densities identified. 2.3 cm indeterminate peripherally calcified structure projecting over the medial aspect of the left supraclavicular region, adjacent to the trachea. Not seen previously. This is of uncertain  clinical significance. IMPRESSION: 1. No acute cardiopulmonary abnormalities. 2. Indeterminate peripherally calcified structure projecting over the medial aspect of the left supraclavicular region adjacent to the trachea. This is of uncertain clinical significance but was not seen on the previous exams. Consider AP and lateral radiographs of the soft tissue of neck to assess for foreign body in this patient presenting with cough. Electronically  Signed   By: Kerby Moors M.D.   On: 08/12/2021 11:34   DG C-Arm 1-60 Min-No Report  Result Date: 08/14/2021 Fluoroscopy was utilized by the requesting physician.  No radiographic interpretation.    Microbiology: Recent Results (from the past 240 hour(s))  Resp Panel by RT-PCR (Flu A&B, Covid) Nasopharyngeal Swab     Status: None   Collection Time: 08/12/21 11:15 AM   Specimen: Nasopharyngeal Swab; Nasopharyngeal(NP) swabs in vial transport medium  Result Value Ref Range Status   SARS Coronavirus 2 by RT PCR NEGATIVE NEGATIVE Final    Comment: (NOTE) SARS-CoV-2 target nucleic acids are NOT DETECTED.  The SARS-CoV-2 RNA is generally detectable in upper respiratory specimens during the acute phase of infection. The lowest concentration of SARS-CoV-2 viral copies this assay can detect is 138 copies/mL. A negative result does not preclude SARS-Cov-2 infection and should not be used as the sole basis for treatment or other patient management decisions. A negative result may occur with  improper specimen collection/handling, submission of specimen other than nasopharyngeal swab, presence of viral mutation(s) within the areas targeted by this assay, and inadequate number of viral copies(<138 copies/mL). A negative result must be combined with clinical observations, patient history, and epidemiological information. The expected result is Negative.  Fact Sheet for Patients:  EntrepreneurPulse.com.au  Fact Sheet for Healthcare  Providers:  IncredibleEmployment.be  This test is no t yet approved or cleared by the [place] FDA and  has been authorized for detection and/or diagnosis of SARS-CoV-2 by FDA under an Emergency Use Authorization (EUA). This EUA will remain  in effect (meaning this test can be used) for the duration of the COVID-19 declaration under Section 564(b)(1) of the Act, 21 U.S.C.section 360bbb-3(b)(1), unless the authorization is terminated  or revoked sooner.       Influenza A by PCR NEGATIVE NEGATIVE Final   Influenza B by PCR NEGATIVE NEGATIVE Final    Comment: (NOTE) The Xpert Xpress SARS-CoV-2/FLU/RSV plus assay is intended as an aid in the diagnosis of influenza from Nasopharyngeal swab specimens and should not be used as a sole basis for treatment. Nasal washings and aspirates are unacceptable for Xpert Xpress SARS-CoV-2/FLU/RSV testing.  Fact Sheet for Patients: EntrepreneurPulse.com.au  Fact Sheet for Healthcare Providers: IncredibleEmployment.be  This test is not yet approved or cleared by the [place] FDA and has been authorized for detection and/or diagnosis of SARS-CoV-2 by FDA under an Emergency Use Authorization (EUA). This EUA will remain in effect (meaning this test can be used) for the duration of the COVID-19 declaration under Section 564(b)(1) of the Act, 21 U.S.C. section 360bbb-3(b)(1), unless the authorization is terminated or revoked.  Performed at [facility], [place] [address], [place], [place] [address]   Surgical PCR screen     Status: Abnormal   Collection Time: 08/14/21  1:22 AM   Specimen: Nasal Mucosa; Nasal Swab  Result Value Ref Range Status   MRSA, PCR NEGATIVE NEGATIVE Final   Staphylococcus aureus POSITIVE (A) NEGATIVE Final    Comment: (NOTE) The Xpert SA Assay (FDA approved for NASAL specimens in patients 47 years of age and older), is one component of a  comprehensive surveillance program. It is not intended to diagnose infection nor to guide or monitor treatment. Performed at [facility], [place] [address] [place],  [address]      Labs: Basic Metabolic Panel: Recent Labs  Lab 08/12/21 1114 08/12/21 1211 08/12/21 1350 08/12/21 2032 08/13/21 0500 08/14/21 2197  NA 140 139 141 142 138 137  K 6.8* 6.5* 4.9 4.8 4.2 4.6  CL 117* 118* 118* 99 111 107  CO2 15*  --  15* 27 17* 18*  GLUCOSE 73 76 90 163* 79 91  BUN 65* 95* 64* 48* 52* 56*  CREATININE 7.28* 7.80* 7.28* 6.48* 6.03* 6.82*  CALCIUM 9.3  --  9.5 10.2 8.4* 9.9  MG  --   --   --  2.5* 1.8  --   PHOS  --   --  4.3  --  5.2*  --    Liver Function Tests: Recent Labs  Lab 08/12/21 1114 08/12/21 1350 08/13/21 0500  AST 65*  --  33  ALT 47*  --  37  ALKPHOS 115  --  114  BILITOT 1.1  --  0.5  PROT 7.4  --  7.0  ALBUMIN 3.3* 3.4* 3.1*   No results for input(s): LIPASE, AMYLASE in the last 168 hours. No results for input(s): AMMONIA in the last 168 hours. CBC: Recent Labs  Lab 08/12/21 1114 08/12/21 1211 08/12/21 1350 08/13/21 0500  WBC 4.5  --  5.0 4.8  NEUTROABS 2.4  --   --  2.3  HGB 12.8 13.6 12.7 12.1  HCT 39.9 40.0 39.2 36.2  MCV 104.5*  --  103.7* 101.1*  PLT 201  --  204 178   Cardiac Enzymes: No results for input(s): CKTOTAL, CKMB, CKMBINDEX, TROPONINI in the last 168 hours. BNP: BNP (last 3 results) Recent Labs    08/12/21 1350  BNP 7.5    ProBNP (last 3 results) No results for input(s): PROBNP in the last 8760 hours.  CBG: No results for input(s): GLUCAP in the last 168 hours.  Principal Problem:   Hyperkalemia Active Problems:   HIV (human immunodeficiency virus infection) ([place])   End stage renal disease ([place])   Secondary hyperparathyroidism ([place])   Hypertension   Time coordinating discharge: 38 minutes  Signed:        Jamale Spangler, DO [facility]  08/14/2021, 6:26 PM

## 2021-08-14 NOTE — Interval H&P Note (Signed)
History and Physical Interval Note:  08/14/2021 11:11 AM  Brooke Baldwin  has presented today for surgery, with the diagnosis of END STAGE RENAL DISEASE.  The various methods of treatment have been discussed with the patient and family. After consideration of risks, benefits and other options for treatment, the patient has consented to  Procedure(s): TUNNELED DIALYSIS CATHETER EXCHANGE (N/A) as a surgical intervention.  The patient's history has been reviewed, patient examined, no change in status, stable for surgery.  I have reviewed the patient's chart and labs.  Questions were answered to the patient's satisfaction.     [name]

## 2021-08-14 NOTE — Anesthesia Postprocedure Evaluation (Signed)
Anesthesia Post Note  Patient: Brooke Baldwin  Procedure(s) Performed: TUNNELED DIALYSIS CATHETER EXCHANGE     Patient location during evaluation: PACU Anesthesia Type: General Level of consciousness: awake and alert, oriented and patient cooperative Pain management: pain level controlled Vital Signs Assessment: post-procedure vital signs reviewed and stable Respiratory status: spontaneous breathing, nonlabored ventilation and respiratory function stable Cardiovascular status: blood pressure returned to baseline and stable Postop Assessment: no apparent nausea or vomiting Anesthetic complications: no   No notable events documented.  Last Vitals:  Vitals:   08/14/21 1255 08/14/21 1300  BP:  109/89  Pulse: 98 96  Resp: (!) 22 18  Temp:    SpO2: 97% 96%    Last Pain:  Vitals:   08/14/21 1300  TempSrc:   PainSc: 0-No pain                 Pervis Hocking

## 2021-08-14 NOTE — Care Management Obs Status (Signed)
[place] NOTIFICATION   Patient Details  Name: Brooke Baldwin MRN: [id number] Date of Birth: 04-17-1976   Medicare Observation Status Notification Given:  Yes    Tom-Johnson, Renea Ee, RN 08/14/2021, 10:43 AM

## 2021-08-14 NOTE — Anesthesia Procedure Notes (Signed)
Procedure Name: Intubation Date/Time: 08/14/2021 12:02 PM Performed by: Lorie Phenix, CRNA Pre-anesthesia Checklist: Patient identified, Emergency Drugs available, Suction available and Patient being monitored Patient Re-evaluated:Patient Re-evaluated prior to induction Oxygen Delivery Method: Circle system utilized Preoxygenation: Pre-oxygenation with 100% oxygen Induction Type: IV induction Ventilation: Mask ventilation without difficulty Grade View: Grade I Tube type: Oral Tube size: 7.5 mm Number of attempts: 1 Airway Equipment and Method: Stylet Placement Confirmation: ETT inserted through vocal cords under direct vision, positive ETCO2 and breath sounds checked- equal and bilateral Secured at: 22 cm Tube secured with: Tape Dental Injury: Teeth and Oropharynx as per pre-operative assessment

## 2021-08-14 NOTE — TOC Initial Note (Signed)
Transition of Care [facility]) - Initial/Assessment Note    Patient Details  Name: Brooke Baldwin MRN: [id number] Date of Birth: 1975/11/17  Transition of Care [facility]) CM/SW Contact:    Milinda Antis, [place] Phone Number: 08/14/2021, 4:34 PM  Clinical Narrative:                  Transition of Care Department [facility]) has reviewed patient and no TOC needs have been identified at this time. We will continue to monitor patient advancement through interdisciplinary progression rounds. If new patient transition needs arise, please place a TOC consult.     Patient Goals and CMS Choice        Expected Discharge Plan and Services                                                Prior Living Arrangements/Services                       Activities of Daily Living Home Assistive Devices/Equipment: None ADL Screening (condition at time of admission) Patient's cognitive ability adequate to safely complete daily activities?: Yes Is the patient deaf or have difficulty hearing?: No Does the patient have difficulty seeing, even when wearing glasses/contacts?: No Does the patient have difficulty concentrating, remembering, or making decisions?: No Patient able to express need for assistance with ADLs?: No Does the patient have difficulty dressing or bathing?: No Independently performs ADLs?: Yes (appropriate for developmental age) Does the patient have difficulty walking or climbing stairs?: No Weakness of Legs: None Weakness of Arms/Hands: None  Permission Sought/Granted                  Emotional Assessment              Admission diagnosis:  Hyperkalemia [E87.5] Peripheral edema [R60.9] Acute cough [R05.1] Patient Active Problem List   Diagnosis Date Noted   Hyperkalemia 08/12/2021   Adnexal mass 05/31/2021   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   Need for hepatitis B booster vaccination 09/30/2020   Need for pneumococcal  vaccination 09/30/2020   Need for COVID-19 vaccine 09/30/2020   Palpitations 09/08/2020   Class 1 obesity due to excess calories with serious comorbidity and body mass index (BMI) of 31.0 to 31.9 in adult 09/08/2020   Hypertension 09/08/2020   Healthcare maintenance 08/19/2020   Need for pneumocystis prophylaxis 07/27/2020   Vaccine counseling 07/27/2020   Hypoalbuminemia 07/01/2020   Secondary hyperparathyroidism ([place]) 07/01/2020   Neutropenia ([place]) 07/01/2020   Thrombocytopenia ([place]) 07/01/2020   HIV (human immunodeficiency virus infection) ([place]) 06/30/2020   End stage renal disease ([place]) 06/30/2020   Elevated blood pressure reading without diagnosis of hypertension 06/30/2020   Right trigeminal neuralgia 12/09/2019   PCP:  Shanon Rosser, PA-C Pharmacy:   [facility] [place], [place] AT [facility] [address] [place] [address] Phone: [phone] Fax: [place] [address] [place] - [address] [place] [place] [address] Phone: [phone] Fax: [phone]     Social Determinants of Health (SDOH) Interventions    Readmission Risk Interventions No flowsheet data found.

## 2021-08-14 NOTE — Progress Notes (Addendum)
Pt receives out-pt HD at [facility] on TTS. Pt arrives at 11:40 for 12:00 chair time. Spoke to [place], Agricultural consultant, at [employer] who states pt can resume tomorrow. Contacted attending, nephrologist, and care team via secure chat to inquire about possible d/c date. Awaiting MD response. Will assist as needed.  Melven Sartorius Renal Navigator [phone]  Addendum at 5:00 pm: Pt to receive HD today to ensure new [facility] works properly. PT to d/c to home after HD. Pt will need to resume at out-pt clinic tomorrow. Pt's schedule placed on AVS. Spoke to Melissa at [facility] to make clinic aware pt to resume care tomorrow.

## 2021-08-14 NOTE — Transfer of Care (Signed)
Immediate Anesthesia Transfer of Care Note  Patient: Brooke Baldwin  Procedure(s) Performed: TUNNELED DIALYSIS CATHETER EXCHANGE  Patient Location: PACU  Anesthesia Type:General  Level of Consciousness: awake and alert   Airway & Oxygen Therapy: Patient Spontanous Breathing  Post-op Assessment: Report given to RN and Post -op Vital signs reviewed and stable  Post vital signs: Reviewed and stable  Last Vitals:  Vitals Value Taken Time  BP 122/87   Temp    Pulse 97   Resp 16   SpO2 98     Last Pain:  Vitals:   08/14/21 1118  TempSrc:   PainSc: 0-No pain      Patients Stated Pain Goal: 3 (03/40/35 2481)  Complications: No notable events documented.

## 2021-08-14 NOTE — Progress Notes (Signed)
Ok to resume atovaquone 1500mg  PO qday for PJP prophylaxis while CD4 is pending per Dr. Benny Lennert.  Onnie Boer, PharmD, BCIDP, AAHIVP, CPP Infectious Disease Pharmacist 08/14/2021 10:03 AM

## 2021-08-15 ENCOUNTER — Telehealth: Payer: Self-pay | Admitting: Physician Assistant

## 2021-08-15 ENCOUNTER — Encounter (HOSPITAL_COMMUNITY): Payer: Self-pay | Admitting: Vascular Surgery

## 2021-08-15 DIAGNOSIS — T8241XA Breakdown (mechanical) of vascular dialysis catheter, initial encounter: Secondary | ICD-10-CM | POA: Diagnosis not present

## 2021-08-15 LAB — HIV-1 RNA QUANT-NO REFLEX-BLD
HIV 1 RNA Quant: <20 copies/mL
LOG10 HIV-1 RNA: UNDETERMINED log10copy/mL

## 2021-08-15 NOTE — Progress Notes (Signed)
DISCHARGE NOTE   Brooke Baldwin to be discharged Home per MD order. Patient verbalized understanding.  Skin clean, dry and intact without evidence of skin break down, no evidence of skin tears noted. IV catheter discontinued intact. Site without signs and symptoms of complications. Dressing and pressure applied. Pt denies pain at the site currently. No complaints noted.  Patient free of lines, drains, and wounds.   Discharge packet assembled. An After Visit Summary (AVS) was printed and given to patient. Patient escorted via wheelchair and went home via private auto.  Babs Sciara, RN

## 2021-08-15 NOTE — Telephone Encounter (Signed)
Transition of care contact from inpatient facility  Date of discharge: 08/14/21 Date of contact: 08/15/21 Method: Phone Spoke to: Patient  Patient contacted to discuss transition of care from recent inpatient hospitalization. Patient was admitted to [facility] from 08/12/21-08/14/21 with discharge diagnosis of malfunctioning dialysis access, missed HD. Reports her arm is sore and she is tired, but otherwise no concerns. She did not go to dialysis today since she went last night.   Medication changes were reviewed.  Patient will follow up with his/her outpatient HD unit on: Thursday  Brooke Shimizu, PA-C 08/15/2021, 1:34 PM  [employer]

## 2021-08-22 ENCOUNTER — Ambulatory Visit: Payer: POS | Admitting: Internal Medicine

## 2021-08-25 ENCOUNTER — Telehealth: Payer: Self-pay

## 2021-08-25 NOTE — Telephone Encounter (Signed)
Pt called asking for another copy of work release note I wrote for her last week. She is coming today to pick it up.Same note has been reprinted. No further questions/concerns at this time.

## 2021-08-31 ENCOUNTER — Ambulatory Visit: Payer: POS | Admitting: Internal Medicine

## 2021-08-31 NOTE — Progress Notes (Deleted)
[place] for Infectious Disease   CHIEF COMPLAINT    HIV follow up.    SUBJECTIVE:    Brooke Baldwin is a 46 y.o. female with PMHx as below who presents to the clinic for HIV follow up.   Her last visit with me was a virtual visit on 05/23/21.  She was adherent with Biktarvy at that time.  In the interval she was admitted earlier this month for missed HD sessions and underwent HD catheter exchange.  She is also following with OB for a solid appearing adnexal mass, suspicious for either solid ovarian tumor vs uterine fibroid.  She is scheduled for surgery with OB on 10/11/21.  Her viral load earlier this month was <20 and CD4 count 107   Please see A&P for the details of today's visit and status of the patient's medical problems.   Patient's Medications  New Prescriptions   No medications on file  Previous Medications   ALUMINUM-MAGNESIUM HYDROXIDE-SIMETHICONE (MAALOX) 200-200-20 MG/5ML SUSP    Take 30 mLs by mouth 4 (four) times daily -  before meals and at bedtime.   ATOVAQUONE (MEPRON) 750 MG/5ML SUSPENSION    Take 10 mLs (1,500 mg total) by mouth daily.   BICTEGRAVIR-EMTRICITABINE-TENOFOVIR AF (BIKTARVY) 50-200-25 MG TABS TABLET    Take 1 tablet by mouth daily.   FERRIC CITRATE (AURYXIA) 1 GM 210 MG(FE) TABLET    Take 420 mg by mouth See admin instructions. Take 2 tablets (420 mg) by mouth once or twice daily with meals   FLUTICASONE (FLONASE) 50 MCG/ACT NASAL SPRAY    Place 2 sprays into both nostrils daily.   MULTIVITAMIN (RENA-VIT) TABS TABLET    Take 1 tablet by mouth at bedtime.   MUPIROCIN OINTMENT (BACTROBAN) 2 %    Place 1 application into the nose 2 (two) times daily.   OXYCODONE-ACETAMINOPHEN (PERCOCET/ROXICET) 5-325 MG TABLET    Take 1 tablet by mouth every 4 (four) hours as needed for severe pain.  Modified Medications   No medications on file  Discontinued Medications   No medications on file      Past Medical History:  Diagnosis Date    Anemia associated with chronic renal failure    Atrophic kidney    bilateral   CIN I (cervical intraepithelial neoplasia I)    ESRD (end stage renal disease) on dialysis ([place]) 05/2020   hemodialysis  T/ Th/ Sat  @ [facility] ([address] in [place], ( 10-17-2020  s/p left AVG 11/ 2021 unable to use as of yet, currently using right IJ tunneled cath (right side of upper chest) for dialysis   FSGS (focal segmental glomerulosclerosis)    renal biopsy 04-15-2020 in epic , secondary to HTN/ HIV/ Obese   Heart palpitations 11/ 2021  (10-17-2020  pt stated the palpitations does not bother her and has no other symptoms , stated they started 11/ 2021)   pt was been seen by cardiology, dr s. Patria Mane ([facility] in [place]),  per pt had event monitor for 17 days , was told normal with  no arrhythmias,  and stated echo is scheduled for 10-28-2020   History of vulvar dysplasia    HIV (human immunodeficiency virus infection) ([place])    followed by ID-- dr a. Quinette Hentges   Hypertension    S/P arteriovenous (AV) fistula creation 07-04-2020 '@[facility]'    left forearm,  07-28-2020  angioplasty of thrombosis   Secondary hyperparathyroidism of renal origin ([place])    Tachycardia  Trigeminal neuralgia    ride side ---  neurologist--- dr Krista Blue   (10-17-2020  pt stated last episode about 4 months ago was mild)   VIN II (vulvar intraepithelial neoplasia II)     Social History   Tobacco Use   Smoking status: Never   Smokeless tobacco: Never  Vaping Use   Vaping Use: Never used  Substance Use Topics   Alcohol use: Not Currently   Drug use: Not Currently    Family History  Problem Relation Age of Onset   Diabetes Mother    Healthy Father    Breast cancer Maternal Aunt    Pancreatic cancer Maternal Grandfather    Colon cancer Neg Hx    Colon polyps Neg Hx    Esophageal cancer Neg Hx    Rectal cancer Neg Hx    Stomach cancer Neg Hx    Prostate cancer Neg Hx    Endometrial cancer Neg Hx    Ovarian cancer Neg Hx      Allergies  Allergen Reactions   Baclofen Other (See Comments)    Caused intolerable dizziness    Gabapentin Other (See Comments)    Caused intolerable dizziness   Penicillins Other (See Comments)    Unknown childhood reaction     ROS   OBJECTIVE:    There were no vitals filed for this visit.   There is no height or weight on file to calculate BMI.  Physical Exam  Labs and Microbiology: CMP Latest Ref Rng & Units 08/14/2021 08/13/2021 08/12/2021  Glucose 70 - 99 mg/dL 91 79 163(H)  BUN 6 - 20 mg/dL 56(H) 52(H) 48(H)  Creatinine 0.44 - 1.00 mg/dL 6.82(H) 6.03(H) 6.48(H)  Sodium 135 - 145 mmol/L 137 138 142  Potassium 3.5 - 5.1 mmol/L 4.6 4.2 4.8  Chloride 98 - 111 mmol/L 107 111 99  CO2 22 - 32 mmol/L 18(L) 17(L) 27  Calcium 8.9 - 10.3 mg/dL 9.9 8.4(L) 10.2  Total Protein 6.5 - 8.1 g/dL - 7.0 -  Total Bilirubin 0.3 - 1.2 mg/dL - 0.5 -  Alkaline Phos 38 - 126 U/L - 114 -  AST 15 - 41 U/L - 33 -  ALT 0 - 44 U/L - 37 -   CBC Latest Ref Rng & Units 08/13/2021 08/12/2021 08/12/2021  WBC 4.0 - 10.5 K/uL 4.8 5.0 -  Hemoglobin 12.0 - 15.0 g/dL 12.1 12.7 13.6  Hematocrit 36.0 - 46.0 % 36.2 39.2 40.0  Platelets 150 - 400 K/uL 178 204 -     Lab Results  Component Value Date   HIV1RNAQUANT <20 08/13/2021   HIV1RNAQUANT Not Detected 05/04/2021   HIV1RNAQUANT 53 (H) 12/21/2020   CD4TABS 107 (L) 08/13/2021   CD4TABS 81 (L) 05/04/2021   CD4TABS 42 (L) 12/21/2020    RPR and STI: Lab Results  Component Value Date   LABRPR NON-REACTIVE 05/04/2021   LABRPR NON REACTIVE 07/03/2020    STI Results GC CT  05/04/2021 Negative Negative    Hepatitis B: Lab Results  Component Value Date   HEPBSAB NON REACTIVE 08/12/2021   HEPBSAG NON REACTIVE 08/12/2021   HEPBSAG NON REACTIVE 08/12/2021   HEPBCAB NON REACTIVE 07/03/2020   Hepatitis C: No results found for: HEPCAB, HCVRNAPCRQN Hepatitis A: Lab Results  Component Value Date   HAV NON REACTIVE 07/03/2020   Lipids: Lab Results   Component Value Date   CHOL 159 08/19/2020   TRIG 73 08/19/2020   HDL 71 08/19/2020   CHOLHDL 2.2 08/19/2020  [place] 73 08/19/2020    Imaging: ***   ASSESSMENT & PLAN:    No problem-specific Assessment & Plan notes found for this encounter.   No orders of the defined types were placed in this encounter.      *** Vaccines Influenza: give every year COVID: recommend vaccination if not already done Pneumovax-23: (if CD4 >200) give twice every 5 years apart before age 22, then once at age 20.  Give >8 weeks from [place] Prevnar-13: (preferably when CD4 >200) give once, give >1 year from last Pneumovax-23 Hepatitis A: give Havrix 2 dose series at 0 and 6-12 months if non-immune Hepatitis B: give Heplisav 2 dose series at 0 and 4 weeks if non-immune.  Repeat serology 2 months after vaccine and revaccinate if needed MenACWY: 2 dose primary series 8 weeks apart, then 1 dose booster every 5 years HPV: Gardasil-9 at 0, 2, and 6 months for ages 9-26 should be vaccinated.  Ages 33-45 should be offered if appropriate Tdap: give every 10 years Shingles: give Shingrix 2 dose series at 0 and 2-6 months if >50 years on ART with CD4 cell count >200 Varicella: primary vaccination may be considered in VZV seronegative persons aged >8 years (if CD4 >200)  MMR: vaccine should be given if born in 16 or after and do not have immunity (if CD4 >200)  Screening DEXA Scan: if age >10 Quantiferon: check at initiation of care Hepatitis C: check at initiation of care.  Screen annually if risk factors HLA B5701: check at initiation of care G6PD: check if starting therapy with oxidant drugs Lipids: check annually Urinalysis: check annually or every 6 months if on tenofovir Hgb A1c: check annually  ASCVD Risk Score Consider high-intensity statin therapy if 10-year ASCVD risk score >7.5% The 10-year ASCVD risk score (Arnett DK, et al., 2019) is: 1%   [facility]fectious [facility] 08/31/2021, 4:44 AM  HIV: Patient doing well on Biktarvy and recent viral load undetectable earlier this month.  Will continue Biktarvy and RTC 6 months.  She should have refills on file and was asked to let us know immediately if any issues obtaining her medications.   Need for PCP PPx: CD4 count remains under 200 earlier this month.  Discussed continued need for prophylaxis with atovaquone.  Vaccines: *** PCV 20 and HBV vaccination.    ESRD: She continues on HD.  She is exploring the option of PD.

## 2021-09-06 ENCOUNTER — Telehealth: Payer: Self-pay

## 2021-09-06 ENCOUNTER — Ambulatory Visit: Payer: POS | Admitting: Family

## 2021-09-06 NOTE — Telephone Encounter (Signed)
Reached out to patient due to missing appointment scheduled for today with Terri Piedra, NP. Patient agrees to following up with Dr. Juleen China with labs prior to provider visit.  Patient accepts new appointment and will access these appointment via [place].  [name]

## 2021-09-11 ENCOUNTER — Encounter (HOSPITAL_COMMUNITY): Payer: Self-pay | Admitting: Radiology

## 2021-09-27 ENCOUNTER — Ambulatory Visit (INDEPENDENT_AMBULATORY_CARE_PROVIDER_SITE_OTHER): Payer: POS | Admitting: Vascular Surgery

## 2021-09-27 ENCOUNTER — Encounter: Payer: Self-pay | Admitting: Vascular Surgery

## 2021-09-27 ENCOUNTER — Other Ambulatory Visit: Payer: Self-pay

## 2021-09-27 ENCOUNTER — Other Ambulatory Visit: Payer: POS

## 2021-09-27 VITALS — BP 120/86 | HR 85 | Temp 97.9°F | Resp 20 | Ht 62.0 in | Wt 188.0 lb

## 2021-09-27 DIAGNOSIS — Z992 Dependence on renal dialysis: Secondary | ICD-10-CM | POA: Diagnosis not present

## 2021-09-27 DIAGNOSIS — B20 Human immunodeficiency virus [HIV] disease: Secondary | ICD-10-CM

## 2021-09-27 DIAGNOSIS — N186 End stage renal disease: Secondary | ICD-10-CM

## 2021-09-27 DIAGNOSIS — Z113 Encounter for screening for infections with a predominantly sexual mode of transmission: Secondary | ICD-10-CM

## 2021-09-27 DIAGNOSIS — Z79899 Other long term (current) drug therapy: Secondary | ICD-10-CM

## 2021-09-27 NOTE — Progress Notes (Signed)
°  ° °  Subjective:     Patient ID: Brooke Baldwin, female   DOB: [birth year], 46 y.o.   MRN: [id number]  HPI 46 year old female with end-stage renal disease currently dialyzing via right IJ catheter that was placed in January of this year when a separate catheter was removed for malfunction.  She has previous left forearm AV loop graft that is thrombosed and thrombosed shortly after it was placed.  She did have numbness of her hand this is somewhat improving.  She is not currently interested in further access.  She has been considered for peritoneal dialysis however she has large fibroids which are being planned to be removed.  She has a stitch in her right chest from her previous catheter which she has been told needs to be taken out.   Review of Systems Improving numbness left hand    Objective:   Physical Exam Vitals:   09/27/21 1120  BP: 120/86  Pulse: 85  Resp: 20  Temp: 97.9 F (36.6 C)  SpO2: 100%   Right IJ catheter is in place there is some scabbing around the insertion site There is a 3-0 nylon stitch at the previous access site which is removed today.     Assessment:     46 year old female with end-stage renal disease currently dialyzing via right IJ catheter.  I removed a stitch from her previous catheter placement.  Patient is currently not interested in any further hemodialysis accesses and is considering peritoneal dialysis.    Plan:     Follow-up as needed to discuss further access.  Kamiya Acord C. Donzetta Matters, MD [facility] Office: [phone] Pager: [phone]

## 2021-09-29 LAB — COMPLETE METABOLIC PANEL WITH GFR
AG Ratio: 1.2 (calc) (ref 1.0–2.5)
ALT: 36 U/L — ABNORMAL HIGH (ref 6–29)
AST: 43 U/L — ABNORMAL HIGH (ref 10–35)
Albumin: 4.3 g/dL (ref 3.6–5.1)
Alkaline phosphatase (APISO): 129 U/L — ABNORMAL HIGH (ref 31–125)
BUN/Creatinine Ratio: 9 (calc) (ref 6–22)
BUN: 52 mg/dL — ABNORMAL HIGH (ref 7–25)
CO2: 25 mmol/L (ref 20–32)
Calcium: 9.9 mg/dL (ref 8.6–10.2)
Chloride: 102 mmol/L (ref 98–110)
Creat: 6.1 mg/dL — ABNORMAL HIGH (ref 0.50–0.99)
Globulin: 3.5 g/dL (calc) (ref 1.9–3.7)
Glucose, Bld: 76 mg/dL (ref 65–99)
Potassium: 4.5 mmol/L (ref 3.5–5.3)
Sodium: 140 mmol/L (ref 135–146)
Total Bilirubin: 0.5 mg/dL (ref 0.2–1.2)
Total Protein: 7.8 g/dL (ref 6.1–8.1)
eGFR: 8 mL/min/{1.73_m2} — ABNORMAL LOW (ref >=60)

## 2021-09-29 LAB — CBC WITH DIFFERENTIAL/PLATELET
Absolute Monocytes: 292 cells/uL (ref 200–950)
Basophils Absolute: 50 cells/uL (ref 0–200)
Basophils Relative: 0.9 %
Eosinophils Absolute: 1062 cells/uL — ABNORMAL HIGH (ref 15–500)
Eosinophils Relative: 19.3 %
HCT: 35.5 % (ref 35.0–45.0)
Hemoglobin: 12.1 g/dL (ref 11.7–15.5)
Lymphs Abs: 1095 cells/uL (ref 850–3900)
MCH: 33.8 pg — ABNORMAL HIGH (ref 27.0–33.0)
MCHC: 34.1 g/dL (ref 32.0–36.0)
MCV: 99.2 fL (ref 80.0–100.0)
MPV: 9.7 fL (ref 7.5–12.5)
Monocytes Relative: 5.3 %
Neutro Abs: 3003 cells/uL (ref 1500–7800)
Neutrophils Relative %: 54.6 %
Platelets: 218 10*3/uL (ref 140–400)
RBC: 3.58 10*6/uL — ABNORMAL LOW (ref 3.80–5.10)
RDW: 12.8 % (ref 11.0–15.0)
Total Lymphocyte: 19.9 %
WBC: 5.5 10*3/uL (ref 3.8–10.8)

## 2021-09-29 LAB — LIPID PANEL
Cholesterol: 259 mg/dL — ABNORMAL HIGH (ref <200)
HDL: 85 mg/dL (ref >=50)
LDL Cholesterol (Calc): 149 mg/dL (calc) — ABNORMAL HIGH
Non-HDL Cholesterol (Calc): 174 mg/dL (calc) — ABNORMAL HIGH (ref <130)
Total CHOL/HDL Ratio: 3 (calc) (ref <5.0)
Triglycerides: 124 mg/dL (ref <150)

## 2021-09-29 LAB — HIV-1 RNA QUANT-NO REFLEX-BLD
HIV 1 RNA Quant: NOT DETECTED Copies/mL
HIV-1 RNA Quant, Log: NOT DETECTED Log cps/mL

## 2021-09-29 LAB — RPR: RPR Ser Ql: NONREACTIVE

## 2021-09-29 LAB — C. TRACHOMATIS/N. GONORRHOEAE RNA
C. trachomatis RNA, TMA: NOT DETECTED
N. gonorrhoeae RNA, TMA: NOT DETECTED

## 2021-09-29 LAB — T-HELPER CELLS (CD4) COUNT (NOT AT ARMC)
Absolute CD4: 153 cells/uL — ABNORMAL LOW (ref 490–1740)
CD4 T Helper %: 13 % — ABNORMAL LOW (ref 30–61)
Total lymphocyte count: 1134 cells/uL (ref 850–3900)

## 2021-10-09 ENCOUNTER — Ambulatory Visit: Payer: POS | Admitting: Internal Medicine

## 2021-10-11 ENCOUNTER — Ambulatory Visit: Admit: 2021-10-11 | Payer: POS | Admitting: Obstetrics and Gynecology

## 2021-10-11 SURGERY — XI ROBOTIC ASSISTED LAPAROSCOPIC HYSTERECTOMY AND SALPINGECTOMY
Anesthesia: Choice | Laterality: Bilateral

## 2021-10-12 ENCOUNTER — Ambulatory Visit (INDEPENDENT_AMBULATORY_CARE_PROVIDER_SITE_OTHER): Payer: POS | Admitting: Internal Medicine

## 2021-10-12 ENCOUNTER — Other Ambulatory Visit: Payer: Self-pay

## 2021-10-12 ENCOUNTER — Encounter: Payer: Self-pay | Admitting: Internal Medicine

## 2021-10-12 VITALS — BP 131/90 | HR 86 | Temp 97.7°F | Wt 187.0 lb

## 2021-10-12 DIAGNOSIS — B20 Human immunodeficiency virus [HIV] disease: Secondary | ICD-10-CM | POA: Diagnosis not present

## 2021-10-12 DIAGNOSIS — Z298 Encounter for other specified prophylactic measures: Secondary | ICD-10-CM | POA: Diagnosis not present

## 2021-10-12 DIAGNOSIS — Z992 Dependence on renal dialysis: Secondary | ICD-10-CM

## 2021-10-12 DIAGNOSIS — Z7185 Encounter for immunization safety counseling: Secondary | ICD-10-CM | POA: Diagnosis not present

## 2021-10-12 DIAGNOSIS — N186 End stage renal disease: Secondary | ICD-10-CM

## 2021-10-12 DIAGNOSIS — Z23 Encounter for immunization: Secondary | ICD-10-CM

## 2021-10-12 MED ORDER — ATOVAQUONE 750 MG/5ML PO SUSP: 1500.0000 mg | Freq: Every day | ORAL | 5 refills | Status: DC

## 2021-10-12 MED ORDER — BIKTARVY 50-200-25 MG PO TABS: 1.0000 | ORAL_TABLET | Freq: Every day | ORAL | 11 refills | Status: DC

## 2021-10-12 NOTE — Assessment & Plan Note (Signed)
Brooke Baldwin is here for routine HIV follow up.  Her last visit with me was in October 2022.  She is currently on Biktarvy daily and most recent labs on 09/27/21 showed that her VL was undetectable.  She has had some immune reconstitution but CD4 count remains low at 153 (13%).  Will continue with daily Biktarvy for now and refills sent today.  Discussed the DO IT study with patient and she is not interested at this time.  She prefers to stay on [place].   ?

## 2021-10-12 NOTE — Assessment & Plan Note (Signed)
She is currently dialyzing via right IJ catheter.  Following with [facility] and not interested in any further HD access right now.  She is considering possibly PD vs transplant evaluation pending surgery that is needed for her uterine fibroids.  ?

## 2021-10-12 NOTE — Assessment & Plan Note (Signed)
She is not taking her Atovaquone and discussed rationale for treatment.  She is agreeable to resuming and refills sent today. ?

## 2021-10-12 NOTE — Patient Instructions (Signed)
Thank you for coming to see me today. It was a pleasure seeing you. ? ?To Do: ?Continue biktarvy daily and refills sent ?Resume taking Atovaquone for prophylaxis until your CD4 count is better ?Hepatitis A and B Vaccine today ?Return in 4 weeks for 2nd dose of hepatitis B vaccine (can be a nurse visit) ?Return in 6 months for follow up with me ? ?If you have any questions or concerns, please do not hesitate to call the office at [phone]. ? ?Take Care,  ? ?Jule Ser ? ?

## 2021-10-12 NOTE — Progress Notes (Signed)
?  ? ? ? ? ?[facility] for Infectious Disease ? ? ?CHIEF COMPLAINT   ? ?HIV follow up.   ? ?SUBJECTIVE:   ? ?Brooke Baldwin is a 46 y.o. female with PMHx as below who presents to the clinic for HIV follow up.  ? ?Please see A&P for the details of today's visit and status of the patient's medical problems.  ? ?Patient's Medications  ?New Prescriptions  ? No medications on file  ?Previous Medications  ? FERRIC CITRATE (AURYXIA) 1 GM 210 MG(FE) TABLET    Take 420 mg by mouth See admin instructions. Take 2 tablets (420 mg) by mouth once or twice daily with meals  ? MULTIVITAMIN (RENA-VIT) TABS TABLET    Take 1 tablet by mouth at bedtime.  ? MUPIROCIN OINTMENT (BACTROBAN) 2 %    Place 1 application into the nose 2 (two) times daily.  ?Modified Medications  ? Modified Medication Previous Medication  ? ATOVAQUONE (MEPRON) 750 MG/5ML SUSPENSION atovaquone (MEPRON) 750 MG/5ML suspension  ?    Take 10 mLs (1,500 mg total) by mouth daily.    Take 10 mLs (1,500 mg total) by mouth daily.  ? BICTEGRAVIR-EMTRICITABINE-TENOFOVIR AF (BIKTARVY) 50-200-25 MG TABS TABLET bictegravir-emtricitabine-tenofovir AF (BIKTARVY) 50-200-25 MG TABS tablet  ?    Take 1 tablet by mouth at bedtime.    Take 1 tablet by mouth daily.  ?Discontinued Medications  ? ALUMINUM-MAGNESIUM HYDROXIDE-SIMETHICONE (MAALOX) 696-789-38 MG/5ML SUSP    Take 30 mLs by mouth 4 (four) times daily -  before meals and at bedtime.  ? FLUTICASONE (FLONASE) 50 MCG/ACT NASAL SPRAY    Place 2 sprays into both nostrils daily.  ? OXYCODONE-ACETAMINOPHEN (PERCOCET/ROXICET) 5-325 MG TABLET    Take 1 tablet by mouth every 4 (four) hours as needed for severe pain.  ?   ? ?Past Medical History:  ?Diagnosis Date  ? Anemia associated with chronic renal failure   ? 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 CIN I (cervical intraepithelial neoplasia I)   ? ESRD (end stage renal disease) on dialysis [facility]) 05/2020  ? hemodialysis  T/ Th/ Sat  @ [facility] ([address] in [place], ( 10-17-2020   s/p left AVG 11/ 2021 unable to use as of yet, currently using right IJ tunneled cath (right side of upper chest) for dialysis  ? FSGS (focal segmental glomerulosclerosis)   ? renal biopsy 04-15-2020 in epic , secondary to HTN/ HIV/ Obese  ? Heart palpitations 11/ 2021  (10-17-2020  pt stated the palpitations does not bother her and has no other symptoms , stated they started 11/ 2021)  ? pt was been seen by cardiology, dr s. Patria Mane ([facility] in [place]),  per pt had event monitor for 17 days , was told normal with  no arrhythmias,  and stated echo is scheduled for 10-28-2020  ? History of vulvar dysplasia   ? HIV (human immunodeficiency virus infection) ([place])   ? followed by ID-- dr a. Amarianna Abplanalp  ? Hypertension   ? S/P arteriovenous (AV) fistula creation 07-04-2020 [facility]   ? left forearm,  07-28-2020  angioplasty of thrombosis  ? Secondary hyperparathyroidism of renal origin [facility])   ? Tachycardia   ? Trigeminal neuralgia   ? ride side ---  neurologist--- dr Krista Blue   (10-17-2020  pt stated last episode about 4 months ago was mild)  ? VIN II (vulvar intraepithelial neoplasia II)   ? ? ?Social History  ? ?Tobacco Use  ? Smoking status: Never  ?  Smokeless tobacco: Never  ?Vaping Use  ? Vaping Use: Never used  ?Substance Use Topics  ? Alcohol use: Not Currently  ? Drug use: Not Currently  ? ? ?Family History  ?Problem Relation Age of Onset  ? Diabetes Mother   ? Healthy Father   ? Breast cancer Maternal Aunt   ? Pancreatic cancer Maternal Grandfather   ? Colon cancer Neg Hx   ? Colon polyps Neg Hx   ? Esophageal cancer Neg Hx   ? Rectal cancer Neg Hx   ? Stomach cancer Neg Hx   ? Prostate cancer Neg Hx   ? Endometrial cancer Neg Hx   ? Ovarian cancer Neg Hx   ? ? ?Allergies  ?Allergen Reactions  ? Baclofen Other (See Comments)  ?  Caused intolerable dizziness   ? Gabapentin Other (See Comments)  ?  Caused intolerable dizziness  ? Penicillins Other (See Comments)  ?  Unknown childhood reaction ?  ? ? ?Review of Systems   ?Constitutional: Negative.   ?Respiratory: Negative.    ?Cardiovascular: Negative.   ?Gastrointestinal: Negative.   ? ? ?OBJECTIVE:   ? ?Vitals:  ? 10/12/21 0919  ?BP: 131/90  ?Pulse: 86  ?Temp: 97.7 ?F (36.5 ?C)  ?TempSrc: Oral  ?SpO2: 99%  ?Weight: 187 lb (84.8 kg)  ?   ?Body mass index is 34.2 kg/m?. ? ?Physical Exam ?Constitutional:   ?   General: She is not in acute distress. ?   Appearance: Normal appearance.  ?HENT:  ?   Head: Normocephalic and atraumatic.  ?Eyes:  ?   Extraocular Movements: Extraocular movements intact.  ?   Conjunctiva/sclera: Conjunctivae normal.  ?Pulmonary:  ?   Effort: Pulmonary effort is normal. No respiratory distress.  ?Neurological:  ?   General: No focal deficit present.  ?   Mental Status: She is alert and oriented to person, place, and time.  ?Psychiatric:     ?   Mood and Affect: Mood normal.     ?   Behavior: Behavior normal.  ? ? ?Labs and Microbiology: ?CMP Latest Ref Rng & Units 09/27/2021 08/14/2021 08/13/2021  ?Glucose 65 - 99 mg/dL 76 91 79  ?BUN 7 - 25 mg/dL 52(H) 56(H) 52(H)  ?Creatinine 0.50 - 0.99 mg/dL 6.10(H) 6.82(H) 6.03(H)  ?Sodium 135 - 146 mmol/L 140 137 138  ?Potassium 3.5 - 5.3 mmol/L 4.5 4.6 4.2  ?Chloride 98 - 110 mmol/L 102 107 111  ?CO2 20 - 32 mmol/L 25 18(L) 17(L)  ?Calcium 8.6 - 10.2 mg/dL 9.9 9.9 8.4(L)  ?Total Protein 6.1 - 8.1 g/dL 7.8 - 7.0  ?Total Bilirubin 0.2 - 1.2 mg/dL 0.5 - 0.5  ?Alkaline Phos 38 - 126 U/L - - 114  ?AST 10 - 35 U/L 43(H) - 33  ?ALT 6 - 29 U/L 36(H) - 37  ? ?CBC Latest Ref Rng & Units 09/27/2021 08/13/2021 08/12/2021  ?WBC 3.8 - 10.8 Thousand/uL 5.5 4.8 5.0  ?Hemoglobin 11.7 - 15.5 g/dL 12.1 12.1 12.7  ?Hematocrit 35.0 - 45.0 % 35.5 36.2 39.2  ?Platelets 140 - 400 Thousand/uL 218 178 204  ?  ? ?Lab Results  ?Component Value Date  ? HIV1RNAQUANT Not Detected 09/27/2021  ? HIV1RNAQUANT <20 08/13/2021  ? HIV1RNAQUANT Not Detected 05/04/2021  ? CD4TABS 107 (L) 08/13/2021  ? CD4TABS 81 (L) 05/04/2021  ? CD4TABS 42 (L) 12/21/2020  ? ? ?RPR  and STI: ?Lab Results  ?Component Value Date  ? LABRPR NON-REACTIVE 09/27/2021  ? LABRPR NON-REACTIVE 05/04/2021  ?  LABRPR NON REACTIVE 07/03/2020  ? ? ?STI Results GC CT  ?05/04/2021 Negative Negative  ? ? ?Hepatitis B: ?Lab Results  ?Component Value Date  ? HEPBSAB NON REACTIVE 08/12/2021  ? HEPBSAG NON REACTIVE 08/12/2021  ? HEPBSAG NON REACTIVE 08/12/2021  ? HEPBCAB NON REACTIVE 07/03/2020  ? ?Hepatitis C: ?No results found for: [place], HCVRNAPCRQN ?Hepatitis A: ?Lab Results  ?Component Value Date  ? HAV NON REACTIVE 07/03/2020  ? ?Lipids: ?Lab Results  ?Component Value Date  ? CHOL 259 (H) 09/27/2021  ? TRIG 124 09/27/2021  ? HDL 85 09/27/2021  ? CHOLHDL 3.0 09/27/2021  ? [place] 149 (H) 09/27/2021  ? ? ? ? ? ?ASSESSMENT & PLAN:   ? ?HIV (human immunodeficiency virus infection) ([place]) ?Brooke Baldwin is here for routine HIV follow up.  Her last visit with me was in October 2022.  She is currently on Biktarvy daily and most recent labs on 09/27/21 showed that her VL was undetectable.  She has had some immune reconstitution but CD4 count remains low at 153 (13%).  Will continue with daily Biktarvy for now and refills sent today.  Discussed the DO IT study with patient and she is not interested at this time.  She prefers to stay on [place].   ? ?Need for pneumocystis prophylaxis ?She is not taking her Atovaquone and discussed rationale for treatment.  She is agreeable to resuming and refills sent today. ? ?Vaccine counseling ?Recommended Hepatitis A and B vaccination based on her serologies.  She agrees and will administer today.  RTC 4 weeks for 2nd dose hepatitis B.  ? ?End stage renal disease ([place]) ?She is currently dialyzing via right IJ catheter.  Following with [facility] and not interested in any further HD access right now.  She is considering possibly PD vs transplant evaluation pending surgery that is needed for her uterine fibroids.  ? ? ? ? ?Mignon Pine ?[place] for Infectious Disease ?[facility] ?10/12/2021, 9:44 AM ? ? ? ? ? ? ? ?

## 2021-10-12 NOTE — Assessment & Plan Note (Signed)
Recommended Hepatitis A and B vaccination based on her serologies.  She agrees and will administer today.  RTC 4 weeks for 2nd dose hepatitis B.  ?

## 2021-10-21 ENCOUNTER — Other Ambulatory Visit: Payer: Self-pay

## 2021-10-21 ENCOUNTER — Encounter (HOSPITAL_COMMUNITY): Payer: Self-pay | Admitting: Internal Medicine

## 2021-10-21 ENCOUNTER — Inpatient Hospital Stay (HOSPITAL_COMMUNITY)
Admission: EM | Admit: 2021-10-21 | Discharge: 2021-10-25 | DRG: 314 | Disposition: A | Discharge status: Home / Self Care | Payer: POS | Attending: Internal Medicine | Admitting: Internal Medicine

## 2021-10-21 ENCOUNTER — Ambulatory Visit (HOSPITAL_COMMUNITY)
Admission: EM | Admit: 2021-10-21 | Discharge: 2021-10-21 | Disposition: A | Discharge status: Home / Self Care | Payer: POS

## 2021-10-21 ENCOUNTER — Emergency Department (HOSPITAL_COMMUNITY): Payer: POS

## 2021-10-21 DIAGNOSIS — E875 Hyperkalemia: Secondary | ICD-10-CM

## 2021-10-21 DIAGNOSIS — N2581 Secondary hyperparathyroidism of renal origin: Secondary | ICD-10-CM | POA: Diagnosis present

## 2021-10-21 DIAGNOSIS — D259 Leiomyoma of uterus, unspecified: Secondary | ICD-10-CM | POA: Diagnosis present

## 2021-10-21 DIAGNOSIS — Z9189 Other specified personal risk factors, not elsewhere classified: Secondary | ICD-10-CM | POA: Diagnosis not present

## 2021-10-21 DIAGNOSIS — Z6834 Body mass index (BMI) 34.0-34.9, adult: Secondary | ICD-10-CM

## 2021-10-21 DIAGNOSIS — E872 Acidosis, unspecified: Secondary | ICD-10-CM | POA: Diagnosis present

## 2021-10-21 DIAGNOSIS — T827XXA Infection and inflammatory reaction due to other cardiac and vascular devices, implants and grafts, initial encounter: Secondary | ICD-10-CM | POA: Diagnosis not present

## 2021-10-21 DIAGNOSIS — Z992 Dependence on renal dialysis: Secondary | ICD-10-CM

## 2021-10-21 DIAGNOSIS — D631 Anemia in chronic kidney disease: Secondary | ICD-10-CM | POA: Diagnosis present

## 2021-10-21 DIAGNOSIS — T80212A Local infection due to central venous catheter, initial encounter: Principal | ICD-10-CM | POA: Diagnosis present

## 2021-10-21 DIAGNOSIS — I120 Hypertensive chronic kidney disease with stage 5 chronic kidney disease or end stage renal disease: Secondary | ICD-10-CM | POA: Diagnosis present

## 2021-10-21 DIAGNOSIS — Z21 Asymptomatic human immunodeficiency virus [HIV] infection status: Secondary | ICD-10-CM | POA: Diagnosis present

## 2021-10-21 DIAGNOSIS — Z833 Family history of diabetes mellitus: Secondary | ICD-10-CM

## 2021-10-21 DIAGNOSIS — Z800 Family history of malignant neoplasm of digestive organs: Secondary | ICD-10-CM

## 2021-10-21 DIAGNOSIS — E669 Obesity, unspecified: Secondary | ICD-10-CM | POA: Diagnosis present

## 2021-10-21 DIAGNOSIS — Z803 Family history of malignant neoplasm of breast: Secondary | ICD-10-CM

## 2021-10-21 DIAGNOSIS — N186 End stage renal disease: Secondary | ICD-10-CM | POA: Diagnosis not present

## 2021-10-21 DIAGNOSIS — Z79899 Other long term (current) drug therapy: Secondary | ICD-10-CM

## 2021-10-21 DIAGNOSIS — Y848 Other medical procedures as the cause of abnormal reaction of the patient, or of later complication, without mention of misadventure at the time of the procedure: Secondary | ICD-10-CM | POA: Diagnosis present

## 2021-10-21 DIAGNOSIS — B20 Human immunodeficiency virus [HIV] disease: Secondary | ICD-10-CM | POA: Diagnosis present

## 2021-10-21 DIAGNOSIS — D849 Immunodeficiency, unspecified: Secondary | ICD-10-CM

## 2021-10-21 LAB — COMPREHENSIVE METABOLIC PANEL
ALT: 46 U/L — ABNORMAL HIGH (ref 0–44)
AST: 40 U/L (ref 15–41)
Albumin: 3.5 g/dL (ref 3.5–5.0)
Alkaline Phosphatase: 136 U/L — ABNORMAL HIGH (ref 38–126)
Anion gap: 12 (ref 5–15)
BUN: 55 mg/dL — ABNORMAL HIGH (ref 6–20)
CO2: 19 mmol/L — ABNORMAL LOW (ref 22–32)
Calcium: 9.6 mg/dL (ref 8.9–10.3)
Chloride: 104 mmol/L (ref 98–111)
Creatinine, Ser: 6.62 mg/dL — ABNORMAL HIGH (ref 0.44–1.00)
GFR, Estimated: 7 mL/min — ABNORMAL LOW (ref >60)
Glucose, Bld: 80 mg/dL (ref 70–99)
Potassium: 5.3 mmol/L — ABNORMAL HIGH (ref 3.5–5.1)
Sodium: 135 mmol/L (ref 135–145)
Total Bilirubin: 0.4 mg/dL (ref 0.3–1.2)
Total Protein: 7.8 g/dL (ref 6.5–8.1)

## 2021-10-21 LAB — CBC WITH DIFFERENTIAL/PLATELET
Abs Immature Granulocytes: 0.02 10*3/uL (ref 0.00–0.07)
Basophils Absolute: 0 10*3/uL (ref 0.0–0.1)
Basophils Relative: 0 %
Eosinophils Absolute: 0.8 10*3/uL — ABNORMAL HIGH (ref 0.0–0.5)
Eosinophils Relative: 9 %
HCT: 38.9 % (ref 36.0–46.0)
Hemoglobin: 12.5 g/dL (ref 12.0–15.0)
Immature Granulocytes: 0 %
Lymphocytes Relative: 14 %
Lymphs Abs: 1.2 10*3/uL (ref 0.7–4.0)
MCH: 34.1 pg — ABNORMAL HIGH (ref 26.0–34.0)
MCHC: 32.1 g/dL (ref 30.0–36.0)
MCV: 106 fL — ABNORMAL HIGH (ref 80.0–100.0)
Monocytes Absolute: 1 10*3/uL (ref 0.1–1.0)
Monocytes Relative: 12 %
Neutro Abs: 5.4 10*3/uL (ref 1.7–7.7)
Neutrophils Relative %: 65 %
Platelets: 195 10*3/uL (ref 150–400)
RBC: 3.67 MIL/uL — ABNORMAL LOW (ref 3.87–5.11)
RDW: 12.9 % (ref 11.5–15.5)
WBC: 8.3 10*3/uL (ref 4.0–10.5)
nRBC: 0 % (ref 0.0–0.2)

## 2021-10-21 LAB — LACTIC ACID, PLASMA
Lactic Acid, Venous: 1.3 mmol/L (ref 0.5–1.9)
Lactic Acid, Venous: 2.4 mmol/L (ref 0.5–1.9)

## 2021-10-21 LAB — LIPASE, BLOOD: Lipase: 107 U/L — ABNORMAL HIGH (ref 11–51)

## 2021-10-21 MED ORDER — SODIUM CHLORIDE 0.9 % IV SOLN
2.0000 g | Freq: Once | INTRAVENOUS | Status: AC
Start: 2021-10-21 — End: 2021-10-22
  Administered 2021-10-21: 2 g via INTRAVENOUS
  Filled 2021-10-21: qty 2

## 2021-10-21 MED ORDER — HEPARIN SODIUM (PORCINE) 5000 UNIT/ML IJ SOLN
5000.0000 [IU] | Freq: Three times a day (TID) | INTRAMUSCULAR | Status: DC
  Administered 2021-10-21 – 2021-10-24 (×9): 5000 [IU] via SUBCUTANEOUS
  Filled 2021-10-21 (×8): qty 1

## 2021-10-21 MED ORDER — ATOVAQUONE 750 MG/5ML PO SUSP
1500.0000 mg | Freq: Every day | ORAL | Status: DC
  Administered 2021-10-22 – 2021-10-24 (×3): 1500 mg via ORAL
  Filled 2021-10-21 (×4): qty 10

## 2021-10-21 MED ORDER — BICTEGRAVIR-EMTRICITAB-TENOFOV 50-200-25 MG PO TABS
1.0000 | ORAL_TABLET | Freq: Every day | ORAL | Status: DC
  Administered 2021-10-21 – 2021-10-24 (×4): 1 via ORAL
  Filled 2021-10-21 (×5): qty 1

## 2021-10-21 MED ORDER — RENA-VITE PO TABS
1.0000 | ORAL_TABLET | Freq: Every day | ORAL | Status: DC
  Administered 2021-10-21 – 2021-10-22 (×2): 1 via ORAL
  Filled 2021-10-21 (×3): qty 1

## 2021-10-21 MED ORDER — ONDANSETRON HCL 4 MG PO TABS: 4.0000 mg | ORAL_TABLET | Freq: Four times a day (QID) | ORAL | Status: DC | PRN

## 2021-10-21 MED ORDER — VANCOMYCIN HCL 2000 MG/400ML IV SOLN
2000.0000 mg | Freq: Once | INTRAVENOUS | Status: AC
  Administered 2021-10-22: 2000 mg via INTRAVENOUS
  Filled 2021-10-21 (×2): qty 400

## 2021-10-21 MED ORDER — ACETAMINOPHEN 325 MG PO TABS
650.0000 mg | ORAL_TABLET | Freq: Four times a day (QID) | ORAL | Status: DC | PRN
  Filled 2021-10-21: qty 2

## 2021-10-21 MED ORDER — PIPERACILLIN-TAZOBACTAM 3.375 G IVPB 30 MIN
3.3750 g | Freq: Once | INTRAVENOUS | Status: DC
Start: 2021-10-21 — End: 2021-10-21

## 2021-10-21 MED ORDER — SODIUM CHLORIDE 0.9 % IV SOLN
1.0000 g | INTRAVENOUS | Status: DC
  Administered 2021-10-22 – 2021-10-23 (×2): 1 g via INTRAVENOUS
  Filled 2021-10-21 (×3): qty 1

## 2021-10-21 MED ORDER — VANCOMYCIN HCL IN DEXTROSE 1-5 GM/200ML-% IV SOLN: 1000.0000 mg | INTRAVENOUS | Status: DC

## 2021-10-21 MED ORDER — ACETAMINOPHEN 650 MG RE SUPP: 650.0000 mg | Freq: Four times a day (QID) | RECTAL | Status: DC | PRN

## 2021-10-21 MED ORDER — ONDANSETRON HCL 4 MG/2ML IJ SOLN: 4.0000 mg | Freq: Four times a day (QID) | INTRAMUSCULAR | Status: DC | PRN

## 2021-10-21 MED ORDER — CHLORHEXIDINE GLUCONATE CLOTH 2 % EX PADS
6.0000 | MEDICATED_PAD | Freq: Every day | CUTANEOUS | Status: DC
  Administered 2021-10-22 – 2021-10-24 (×3): 6 via TOPICAL

## 2021-10-21 NOTE — Assessment & Plan Note (Signed)
Cellulitis surrounding a tunneled catheter that can now slide in and out of skin. ?At risk for progression to CLABSI. ?1. Blood cultures ?2. Vasc surg Dr. Stanford Breed: ?1. Leave line alone for the moment, will see in AM, he had planned on pulling it in AM ?3. However, per nephro: ?1. Dont pull line, seems like just tunnel infection at the moment and need dialysis access for tomorrow, dont want to do crash access on weekend. ?4. Empiric vanc + cefepime for the moment ?1. Feel broad spectrum ABx is appropriate here for the moment given risk of progression to life threatening CLABSI in this patient with immunosuppression (HIV and low CD4 count) ?

## 2021-10-21 NOTE — ED Notes (Signed)
PT seen by Provider a was instructed to go to ED. ?

## 2021-10-21 NOTE — Assessment & Plan Note (Signed)
Cont biktarvay ART ?Cont atovaquone PJP PPx ?

## 2021-10-21 NOTE — Progress Notes (Addendum)
Pharmacy Antibiotic Note ? ?Brooke Baldwin is a 46 y.o. female admitted on 10/21/2021 presenting with redness/tenderness around HD cath site tracking up chest/shoulder/neck.  Pharmacy has been consulted for vancomycin and cefepime dosing.  ESRD-HD usually TTS ? ?Plan: ?Vancomycin '2000mg'$  IV x 1, then '1000mg'$  IV q HD ?Cefepime 2g IV x 1, then 1g IV q 24h ?Monitor HD schedule, Cx and clinical progression to narrow ?Vancomycin random level as needed ? ?  ? ?Temp (24hrs), Avg:98.5 ?F (36.9 ?C), Min:98.3 ?F (36.8 ?C), Max:98.7 ?F (37.1 ?C) ? ?Recent Labs  ?Lab 10/21/21 ?1823  ?WBC 8.3  ?CREATININE 6.62*  ?LATICACIDVEN 1.3  ?  ?Estimated Creatinine Clearance: 10.6 mL/min (A) (by C-G formula based on SCr of 6.62 mg/dL (H)).   ? ?Allergies  ?Allergen Reactions  ? Baclofen Other (See Comments)  ?  Caused intolerable dizziness   ? Gabapentin Other (See Comments)  ?  Caused intolerable dizziness  ? Penicillins Other (See Comments)  ?  Unknown childhood reaction ?  ? ? ?Bertis Ruddy, PharmD ?Clinical Pharmacist ?ED Pharmacist Phone # [phone] ?10/21/2021 8:51 PM ? ? ?

## 2021-10-21 NOTE — ED Triage Notes (Signed)
Pt reports pain at dialysis catheter that goes up her neck. No drainage from site.  ?

## 2021-10-21 NOTE — Assessment & Plan Note (Signed)
Nephro to see as above.  Sounds like dialysis tomorrow most likely given K 5.3 today. ?Will repeat BMP in AM. ?

## 2021-10-21 NOTE — ED Provider Notes (Signed)
?[facility] ?Provider Note ? ? ?CSN: [id number] ?Arrival date & time: 10/21/21  1650 ? ?  ? ?History ? ?Chief Complaint  ?Patient presents with  ? Vascular Access Problem  ? ? ?Brooke Baldwin is a 46 y.o. female. ? ?HPI ? ?This patient is a 46 year old female, she has a history of end-stage renal disease and is on dialysis, she has been on dialysis for about a year and a half, she had had a previous dialysis catheter which had been replaced at the beginning of January, the current catheter has been in place for approximately 9 weeks.  She is preparing to try to transition to peritoneal dialysis but because of large uterine fibroid she has not been able to do so at this time.  She reports that she has noticed that her dialysis catheter which is in the right upper chest wall has had some sliding in and out when she moves, when she puts her close on, when she stretches and has noticed over the last several days that she has had some increasing redness and tenderness along the catheter site tracking up on the upper right chest towards the shoulder and the neck.  She denies any fevers or chills or nausea or vomiting, she has not had any discharge or drainage from the site.  She had gone to the urgent care but was redirected to the emergency department for evaluation ? ?Catheter was placed by Dr. Gae Gallop in January ? ?Home Medications ?Prior to Admission medications   ?Medication Sig Start Date End Date Taking? Authorizing Provider  ?atovaquone (MEPRON) 750 MG/5ML suspension Take 10 mLs (1,500 mg total) by mouth daily. 10/12/21   Mignon Pine, DO  ?bictegravir-emtricitabine-tenofovir AF (BIKTARVY) 50-200-25 MG TABS tablet Take 1 tablet by mouth at bedtime. 10/12/21 10/07/22  Mignon Pine, DO  ?ferric citrate (AURYXIA) 1 GM 210 MG(Fe) tablet Take 420 mg by mouth See admin instructions. Take 2 tablets (420 mg) by mouth once or twice daily with meals    [provider]  ?multivitamin (RENA-VIT) TABS tablet Take 1 tablet by mouth at bedtime.    [provider]  ?mupirocin ointment (BACTROBAN) 2 % Place 1 application into the nose 2 (two) times daily. 08/14/21   Swayze, Ava, DO  ?   ? ?Allergies    ?Baclofen, Gabapentin, and Penicillins   ? ?Review of Systems   ?Review of Systems  ?All other systems reviewed and are negative. ? ?Physical Exam ?Updated Vital Signs ?BP (!) 158/96 (BP Location: Right Arm)   Pulse 86   Temp 98.7 ?F (37.1 ?C) (Oral)   Resp 16   LMP 04/02/2015 (Approximate)   SpO2 97%  ?Physical Exam ?Vitals and nursing note reviewed.  ?Constitutional:   ?   General: She is not in acute distress. ?   Appearance: She is well-developed.  ?HENT:  ?   Head: Normocephalic and atraumatic.  ?   Mouth/Throat:  ?   Pharynx: No oropharyngeal exudate.  ?Eyes:  ?   General: No scleral icterus.    ?   Right eye: No discharge.     ?   Left eye: No discharge.  ?   Conjunctiva/sclera: Conjunctivae normal.  ?   Pupils: Pupils are equal, round, and reactive to light.  ?Neck:  ?   Thyroid: No thyromegaly.  ?   Vascular: No JVD.  ?Cardiovascular:  ?   Rate and Rhythm: Normal rate and regular rhythm.  ?  Heart sounds: Normal heart sounds. No murmur heard. ?  No friction rub. No gallop.  ?Pulmonary:  ?   Effort: Pulmonary effort is normal. No respiratory distress.  ?   Breath sounds: Normal breath sounds. No wheezing or rales.  ?   Comments: The dialysis catheter in the right upper chest is no longer sutured to the skin, there is some movement in and out as the patient moves, there is significant tenderness around the catheter site as well as redness tracking onto the chest wall with warmth.  There is no purulence or foul smell. ?Chest:  ?   Chest wall: Tenderness present.  ?Abdominal:  ?   General: Bowel sounds are normal. There is no distension.  ?   Palpations: Abdomen is soft. There is no mass.  ?   Tenderness: There is no abdominal tenderness.   ?Musculoskeletal:     ?   General: No tenderness. Normal range of motion.  ?   Cervical back: Normal range of motion and neck supple.  ?Lymphadenopathy:  ?   Cervical: No cervical adenopathy.  ?Skin: ?   General: Skin is warm and dry.  ?   Findings: Erythema and rash present.  ?Neurological:  ?   Mental Status: She is alert.  ?   Coordination: Coordination normal.  ?Psychiatric:     ?   Behavior: Behavior normal.  ? ? ?ED Results / Procedures / Treatments   ?Labs ?(all labs ordered are listed, but only abnormal results are displayed) ?Labs Reviewed  ?COMPREHENSIVE METABOLIC PANEL - Abnormal; Notable for the following components:  ?    Result Value  ? Potassium 5.3 (*)   ? CO2 19 (*)   ? BUN 55 (*)   ? Creatinine, Ser 6.62 (*)   ? ALT 46 (*)   ? Alkaline Phosphatase 136 (*)   ? GFR, Estimated 7 (*)   ? All other components within normal limits  ?CBC WITH DIFFERENTIAL/PLATELET - Abnormal; Notable for the following components:  ? RBC 3.67 (*)   ? MCV 106.0 (*)   ? MCH 34.1 (*)   ? Eosinophils Absolute 0.8 (*)   ? All other components within normal limits  ?LIPASE, BLOOD - Abnormal; Notable for the following components:  ? Lipase 107 (*)   ? All other components within normal limits  ?CULTURE, BLOOD (ROUTINE X 2)  ?CULTURE, BLOOD (ROUTINE X 2)  ?LACTIC ACID, PLASMA  ?LACTIC ACID, PLASMA  ? ? ?EKG ?EKG Interpretation ? ?Date/Time:  Saturday October 21 2021 18:26:34 EDT ?Ventricular Rate:  91 ?PR Interval:  136 ?QRS Duration: 68 ?QT Interval:  364 ?QTC Calculation: 447 ?R Axis:   13 ?Text Interpretation: Normal sinus rhythm Cannot rule out Anterior infarct , age undetermined Abnormal ECG When compared with ECG of 12-Aug-2021 11:39, PREVIOUS ECG IS PRESENT Confirmed by Noemi Chapel [phone]) on 10/21/2021 8:36:47 PM ? ?Radiology ?DG Chest 2 View ? ?Result Date: 10/21/2021 ?CLINICAL DATA:  Pain around tunneled dialysis catheter EXAM: CHEST - 2 VIEW COMPARISON:  08/14/2021 FINDINGS: The heart size and mediastinal contours are  within normal limits. Large-bore right neck multi lumen vascular catheter, tip appears somewhat retracted compared to prior examination, now over the mid SVC. Both lungs are clear. The visualized skeletal structures are unremarkable. IMPRESSION: 1. No acute abnormality of the lungs. 2. Large-bore right neck multi lumen vascular catheter, tip appears somewhat retracted compared to prior examination, now over the mid SVC. Electronically Signed   By: Jamse Mead.D.  On: 10/21/2021 18:49   ? ?Procedures ?Procedures  ? ? ?Medications Ordered in ED ?Medications  ?vancomycin (VANCOREADY) IVPB 2000 mg/400 mL (has no administration in time range)  ?vancomycin (VANCOCIN) IVPB 1000 mg/200 mL premix (has no administration in time range)  ? ? ?ED Course/ Medical Decision Making/ A&P ?  ?                        ?Medical Decision Making ?Risk ?Prescription drug management. ? ? ?This patient presents to the ED for concern of dialysis catheter dysfunction, this involves an extensive number of treatment options, and is a complaint that carries with it a high risk of complications and morbidity.  The differential diagnosis includes infection, abscess, dysfunctional catheter ? ? ?Co morbidities that complicate the patient evaluation ? ?History of end-stage renal disease ? ? ?Additional history obtained: ? ?Additional history obtained from electronic medical record ?External records from outside source obtained and reviewed including prior placement of dialysis catheter in January, surgical notes ? ? ?Lab Tests: ? ?I Ordered, and personally interpreted labs.  The pertinent results include: CBC and metabolic panel without leukocytosis, renal dysfunction as appropriate ? ? ?Imaging Studies ordered: ? ?I ordered imaging studies including portable chest x-ray ?I independently visualized and interpreted imaging which showed no acute findings to suggest pneumothorax or pneumonia, there does appear to be a slight retraction of the  catheter which is now currently in the SVC ?I agree with the radiologist interpretation ? ? ?Cardiac Monitoring: / EKG: ? ?The patient was maintained on a cardiac monitor.  I personally viewed and interpreted the cardiac monitored which sh

## 2021-10-21 NOTE — ED Provider Triage Note (Signed)
Emergency Medicine Provider Triage Evaluation Note ? ?Brooke Baldwin , a 46 y.o. female  was evaluated in triage.  Pt complains of redness and pain around her dialysis site.  She reports this started two day ago.  She is now having pain in her site and into her neck.  She reports that she sees the line move in and out when she puts on a bra.  No fevers. She missed dialysis yesterday due to diarrhea ? ? ? ?Physical Exam  ?LMP 04/02/2015 (Approximate)  ?Gen:   Awake, no distress   ?Resp:  Normal effort  ?MSK:   Moves extremities without difficulty  ?Other:  Dressing over her tunneled catheter is disrupted.  There is mild erythema around the area. ? ? ?Medical Decision Making  ?Medically screening exam initiated at 6:07 PM.  Appropriate orders placed.  Kabella Cassidy was informed that the remainder of the evaluation will be completed by another provider, this initial triage assessment does not replace that evaluation, and the importance of remaining in the ED until their evaluation is complete. ? ?As patient missed dialysis and has possible infection we will check labs.  She is also had diarrhea which may contribute to her cause further electrolyte derangements. ?  ?Lorin Glass, PA-C ?10/21/21 1819 ? ?

## 2021-10-21 NOTE — ED Triage Notes (Addendum)
Pt here POV from home due right chest HD cath issue. Pt states this is a new cath access placed in Jan 2023. Pt states it slides in and out of access point, and now is tender around site, up into chest and arm . 9/10. Tender to touch. No redness noted in triage. Denies fever, N,V. Some diarrhea. Pt concerned for infection.  ?

## 2021-10-21 NOTE — H&P (Signed)
?History and Physical  ? ? ?Patient: Brooke Baldwin YIF:[id number] DOB: 31-Mar-1976 ?DOA: 10/21/2021 ?DOS: the patient was seen and examined on 10/21/2021 ?PCP: Pcp, No  ?Patient coming from: Home ? ?Chief Complaint:  ?Chief Complaint  ?Patient presents with  ? Vascular Access Problem  ? ?HPI: Brooke Baldwin is a 46 y.o. 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 ? ?ESRD on HD for ~1 yr. ? ?HIV = CD4 of 153 last month, undetectable VL on ART. ? ?Pt undergoing dialysis via tunneled IJ replaced in Jan. ? ?Pt presents to ED with c/o lose dialysis catheter that can slide in and out when she moves.  Over last several days has developed increasing erythema and tenderness along catheter site tracking up on upper R chest toward shoulder and neck. ? ?No fevers, chills, N/V or other systemic symptoms, no drainage. ? ?Went to UC who sent her into ED. ? ?  ?Review of Systems: As mentioned in the history of present illness. All other systems reviewed and are negative. ?Past Medical History:  ?Diagnosis Date  ? Anemia associated with chronic renal failure   ? 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 CIN I (cervical intraepithelial neoplasia I)   ? ESRD (end stage renal disease) on dialysis [facility]) 05/2020  ? hemodialysis  T/ Th/ Sat  @ [facility] ([address] in [place], ( 10-17-2020  s/p left AVG 11/ 2021 unable to use as of yet, currently using right IJ tunneled cath (right side of upper chest) for dialysis  ? FSGS (focal segmental glomerulosclerosis)   ? renal biopsy 04-15-2020 in epic , secondary to HTN/ HIV/ Obese  ? Heart palpitations 11/ 2021  (10-17-2020  pt stated the palpitations does not bother her and has no other symptoms , stated they started 11/ 2021)  ? pt was been seen by cardiology, dr s. Patria Mane ([facility] in [place]),  per pt had event monitor for 17 days , was told normal with  no arrhythmias,  and stated echo is scheduled for 10-28-2020  ? History of vulvar dysplasia   ? HIV  (human immunodeficiency virus infection) ([place])   ? followed by ID-- dr a. wallace  ? Hypertension   ? S/P arteriovenous (AV) fistula creation 07-04-2020 '@[facility]   ? left forearm,  07-28-2020  angioplasty of thrombosis  ? Secondary hyperparathyroidism of renal origin [facility])   ? Tachycardia   ? Thrombocytopenia ([place]) 07/01/2020  ? Trigeminal neuralgia   ? ride side ---  neurologist--- dr Krista Blue   (10-17-2020  pt stated last episode about 4 months ago was mild)  ? VIN II (vulvar intraepithelial neoplasia II)   ? ?Past Surgical History:  ?Procedure Laterality Date  ? A/V FISTULAGRAM Left 07/28/2020  ? Procedure: A/V FISTULAGRAM;  Surgeon: Marty Heck, MD;  Location: [place] [facility];  Service: Cardiovascular;  Laterality: Left;  ? AV FISTULA PLACEMENT Left 07/04/2020  ? Procedure: INSERTION OF LEFT ARTERIOVENOUS (AV) GORE-TEX GRAFT ARM (ACUSEAL);  Surgeon: Cherre Robins, MD;  Location: [facility];  Service: Vascular;  Laterality: Left;  ? CERVICAL BIOPSY  W/ LOOP ELECTRODE EXCISION  2005  ? CO2 LASER APPLICATION N/A 8/67/6720  ? Procedure: CO2 LASER APPLICATION;  Surgeon: Everitt Amber, MD;  Location: [facility];  Service: Gynecology;  Laterality: N/A;  ? INSERTION OF DIALYSIS CATHETER N/A 08/14/2021  ? Procedure: TUNNELED DIALYSIS CATHETER EXCHANGE;  Surgeon: Angelia Mould, MD;  Location: [facility];  Service: Vascular;  Laterality: N/A;  ? ORIF ANKLE FRACTURE Right 2012  ? per pt retained hardware  ? PERIPHERAL VASCULAR BALLOON ANGIOPLASTY Left 07/28/2020  ? Procedure: PERIPHERAL VASCULAR BALLOON ANGIOPLASTY;  Surgeon: Marty Heck, MD;  Location: [place];  Service: Cardiovascular;  Laterality: Left;  AVF  ? VULVA SURGERY  2013  ? excision  ? ?Social History:  reports that she has never smoked. She has never used smokeless tobacco. She reports that she does not currently use alcohol. She reports that she does not currently use drugs. ? ?Allergies  ?Allergen Reactions  ? Baclofen  Other (See Comments)  ?  Caused intolerable dizziness   ? Gabapentin Other (See Comments)  ?  Caused intolerable dizziness  ? Penicillins Other (See Comments)  ?  Unknown childhood reaction ?  ? ? ?Family History  ?Problem Relation Age of Onset  ? Diabetes Mother   ? Healthy Father   ? Breast cancer Maternal Aunt   ? Pancreatic cancer Maternal Grandfather   ? Colon cancer Neg Hx   ? Colon polyps Neg Hx   ? Esophageal cancer Neg Hx   ? Rectal cancer Neg Hx   ? Stomach cancer Neg Hx   ? Prostate cancer Neg Hx   ? Endometrial cancer Neg Hx   ? Ovarian cancer Neg Hx   ? ? ?Prior to Admission medications   ?Medication Sig Start Date End Date Taking? Authorizing Provider  ?atovaquone (MEPRON) 750 MG/5ML suspension Take 10 mLs (1,500 mg total) by mouth daily. 10/12/21   Mignon Pine, DO  ?bictegravir-emtricitabine-tenofovir AF (BIKTARVY) 50-200-25 MG TABS tablet Take 1 tablet by mouth at bedtime. 10/12/21 10/07/22  Mignon Pine, DO  ?ferric citrate (AURYXIA) 1 GM 210 MG(Fe) tablet Take 420 mg by mouth See admin instructions. Take 2 tablets (420 mg) by mouth once or twice daily with meals    [provider]  ?multivitamin (RENA-VIT) TABS tablet Take 1 tablet by mouth at bedtime.    [provider]  ?mupirocin ointment (BACTROBAN) 2 % Place 1 application into the nose 2 (two) times daily. 08/14/21   Swayze, Ava, DO  ? ? ?Physical Exam: ?Vitals:  ? 10/21/21 1818  ?BP: (!) 158/96  ?Pulse: 86  ?Resp: 16  ?Temp: 98.7 ?F (37.1 ?C)  ?TempSrc: Oral  ?SpO2: 97%  ? ?Constitutional: NAD, calm, comfortable ?Eyes: PERRL, lids and conjunctivae normal ?ENMT: Mucous membranes are moist. Posterior pharynx clear of any exudate or lesions.Normal dentition.  ?Neck: normal, supple, no masses, no thyromegaly ?Respiratory: clear to auscultation bilaterally, no wheezing, no crackles. Normal respiratory effort. No accessory muscle use.  ?Cardiovascular: Regular rate and rhythm, no murmurs / rubs / gallops. No extremity edema. 2+  pedal pulses. No carotid bruits.  ?Abdomen: no tenderness, no masses palpated. No hepatosplenomegaly. Bowel sounds positive.  ?Musculoskeletal: no clubbing / cyanosis. No joint deformity upper and lower extremities. Good ROM, no contractures. Normal muscle tone.  ?Skin: Erythema of RU chest, dialysis catheter not sutured to skin. ?Neurologic: CN 2-12 grossly intact. Sensation intact, DTR normal. Strength 5/5 in all 4.  ?Psychiatric: Normal judgment and insight. Alert and oriented x 3. Normal mood.  ? ?Data Reviewed: ? ?  ?CBC ?   ?Component Value Date/Time  ? WBC 8.3 10/21/2021 1823  ? RBC 3.67 (L) 10/21/2021 1823  ? HGB 12.5 10/21/2021 1823  ? HGB 14.1 12/09/2019 0954  ? HCT 38.9 10/21/2021 1823  ? HCT 41.3 12/09/2019 0954  ? PLT 195 10/21/2021 1823  ?  MCV 106.0 (H) 10/21/2021 1823  ? MCV 86 12/09/2019 0954  ? MCH 34.1 (H) 10/21/2021 1823  ? MCHC 32.1 10/21/2021 1823  ? RDW 12.9 10/21/2021 1823  ? RDW 13.7 12/09/2019 0954  ? LYMPHSABS 1.2 10/21/2021 1823  ? LYMPHSABS 1.1 12/09/2019 0954  ? MONOABS 1.0 10/21/2021 1823  ? EOSABS 0.8 (H) 10/21/2021 1823  ? EOSABS 0.2 12/09/2019 0954  ? BASOSABS 0.0 10/21/2021 1823  ? BASOSABS 0.0 12/09/2019 0954  ? ?BMP Latest Ref Rng & Units 10/21/2021 09/27/2021 08/14/2021  ?Glucose 70 - 99 mg/dL 80 76 91  ?BUN 6 - 20 mg/dL 55(H) 52(H) 56(H)  ?Creatinine 0.44 - 1.00 mg/dL 6.62(H) 6.10(H) 6.82(H)  ?BUN/Creat Ratio 6 - 22 (calc) - 9 -  ?Sodium 135 - 145 mmol/L 135 140 137  ?Potassium 3.5 - 5.1 mmol/L 5.3(H) 4.5 4.6  ?Chloride 98 - 111 mmol/L 104 102 107  ?CO2 22 - 32 mmol/L 19(L) 25 18(L)  ?Calcium 8.9 - 10.3 mg/dL 9.6 9.9 9.9  ? ?CXR: ?IMPRESSION: ?1. No acute abnormality of the lungs. ?2. Large-bore right neck multi lumen vascular catheter, tip appears ?somewhat retracted compared to prior examination, now over the mid ?SVC. ? ? ?Assessment and Plan: ?* Central line insertion site infection ?Cellulitis surrounding a tunneled catheter that can now slide in and out of skin. ?At risk for  progression to CLABSI. ?Blood cultures ?Vasc surg Dr. Stanford Breed: ?Leave line alone for the moment, will see in AM, he had planned on pulling it in AM ?However, per nephro: ?Dont pull line, seems like just tunnel

## 2021-10-21 NOTE — ED Provider Notes (Signed)
Patient was redirected to go to ER for further evaluation as we are unable to manage dialysis catheter in the urgent care ?  ?Brooke Monte, FNP ?10/21/21 1641 ? ?

## 2021-10-22 DIAGNOSIS — E875 Hyperkalemia: Secondary | ICD-10-CM | POA: Diagnosis present

## 2021-10-22 DIAGNOSIS — Y848 Other medical procedures as the cause of abnormal reaction of the patient, or of later complication, without mention of misadventure at the time of the procedure: Secondary | ICD-10-CM | POA: Diagnosis present

## 2021-10-22 DIAGNOSIS — D631 Anemia in chronic kidney disease: Secondary | ICD-10-CM | POA: Diagnosis present

## 2021-10-22 DIAGNOSIS — D259 Leiomyoma of uterus, unspecified: Secondary | ICD-10-CM | POA: Diagnosis present

## 2021-10-22 DIAGNOSIS — T82898A Other specified complication of vascular prosthetic devices, implants and grafts, initial encounter: Secondary | ICD-10-CM | POA: Diagnosis not present

## 2021-10-22 DIAGNOSIS — Z833 Family history of diabetes mellitus: Secondary | ICD-10-CM | POA: Diagnosis not present

## 2021-10-22 DIAGNOSIS — Z803 Family history of malignant neoplasm of breast: Secondary | ICD-10-CM | POA: Diagnosis not present

## 2021-10-22 DIAGNOSIS — E872 Acidosis, unspecified: Secondary | ICD-10-CM | POA: Diagnosis present

## 2021-10-22 DIAGNOSIS — N2581 Secondary hyperparathyroidism of renal origin: Secondary | ICD-10-CM | POA: Diagnosis present

## 2021-10-22 DIAGNOSIS — B20 Human immunodeficiency virus [HIV] disease: Secondary | ICD-10-CM | POA: Diagnosis present

## 2021-10-22 DIAGNOSIS — Z992 Dependence on renal dialysis: Secondary | ICD-10-CM

## 2021-10-22 DIAGNOSIS — T80212A Local infection due to central venous catheter, initial encounter: Secondary | ICD-10-CM | POA: Diagnosis present

## 2021-10-22 DIAGNOSIS — D849 Immunodeficiency, unspecified: Secondary | ICD-10-CM | POA: Diagnosis not present

## 2021-10-22 DIAGNOSIS — N186 End stage renal disease: Secondary | ICD-10-CM | POA: Diagnosis present

## 2021-10-22 DIAGNOSIS — T827XXA Infection and inflammatory reaction due to other cardiac and vascular devices, implants and grafts, initial encounter: Secondary | ICD-10-CM | POA: Diagnosis not present

## 2021-10-22 DIAGNOSIS — E669 Obesity, unspecified: Secondary | ICD-10-CM | POA: Diagnosis present

## 2021-10-22 DIAGNOSIS — T80212D Local infection due to central venous catheter, subsequent encounter: Secondary | ICD-10-CM | POA: Diagnosis not present

## 2021-10-22 DIAGNOSIS — I120 Hypertensive chronic kidney disease with stage 5 chronic kidney disease or end stage renal disease: Secondary | ICD-10-CM | POA: Diagnosis present

## 2021-10-22 DIAGNOSIS — Z800 Family history of malignant neoplasm of digestive organs: Secondary | ICD-10-CM | POA: Diagnosis not present

## 2021-10-22 DIAGNOSIS — Z6834 Body mass index (BMI) 34.0-34.9, adult: Secondary | ICD-10-CM | POA: Diagnosis not present

## 2021-10-22 DIAGNOSIS — Z79899 Other long term (current) drug therapy: Secondary | ICD-10-CM | POA: Diagnosis not present

## 2021-10-22 LAB — BASIC METABOLIC PANEL
Anion gap: 10 (ref 5–15)
BUN: 57 mg/dL — ABNORMAL HIGH (ref 6–20)
CO2: 16 mmol/L — ABNORMAL LOW (ref 22–32)
Calcium: 9.1 mg/dL (ref 8.9–10.3)
Chloride: 109 mmol/L (ref 98–111)
Creatinine, Ser: 6.46 mg/dL — ABNORMAL HIGH (ref 0.44–1.00)
GFR, Estimated: 7 mL/min — ABNORMAL LOW (ref >60)
Glucose, Bld: 177 mg/dL — ABNORMAL HIGH (ref 70–99)
Potassium: 4.4 mmol/L (ref 3.5–5.1)
Sodium: 135 mmol/L (ref 135–145)

## 2021-10-22 LAB — CBC
HCT: 35.4 % — ABNORMAL LOW (ref 36.0–46.0)
Hemoglobin: 11.6 g/dL — ABNORMAL LOW (ref 12.0–15.0)
MCH: 34.1 pg — ABNORMAL HIGH (ref 26.0–34.0)
MCHC: 32.8 g/dL (ref 30.0–36.0)
MCV: 104.1 fL — ABNORMAL HIGH (ref 80.0–100.0)
Platelets: 168 10*3/uL (ref 150–400)
RBC: 3.4 MIL/uL — ABNORMAL LOW (ref 3.87–5.11)
RDW: 12.6 % (ref 11.5–15.5)
WBC: 7.7 10*3/uL (ref 4.0–10.5)
nRBC: 0 % (ref 0.0–0.2)

## 2021-10-22 LAB — LACTIC ACID, PLASMA: Lactic Acid, Venous: 0.8 mmol/L (ref 0.5–1.9)

## 2021-10-22 LAB — HEPATITIS B SURFACE ANTIBODY,QUALITATIVE: Hep B S Ab: NONREACTIVE

## 2021-10-22 LAB — HEPATITIS B SURFACE ANTIGEN: Hepatitis B Surface Ag: NONREACTIVE

## 2021-10-22 MED ORDER — LIDOCAINE HCL (PF) 1 % IJ SOLN
30.0000 mL | Freq: Once | INTRAMUSCULAR | Status: AC
  Administered 2021-10-22: 30 mL
  Filled 2021-10-22: qty 30

## 2021-10-22 MED ORDER — PENTAFLUOROPROP-TETRAFLUOROETH EX AERO: 1.0000 "application " | INHALATION_SPRAY | CUTANEOUS | Status: DC | PRN

## 2021-10-22 MED ORDER — LIDOCAINE HCL (PF) 1 % IJ SOLN: 5.0000 mL | INTRAMUSCULAR | Status: DC | PRN

## 2021-10-22 MED ORDER — ALTEPLASE 2 MG IJ SOLR: 2.0000 mg | Freq: Once | INTRAMUSCULAR | Status: DC | PRN

## 2021-10-22 MED ORDER — SODIUM CHLORIDE 0.9 % IV SOLN: 100.0000 mL | INTRAVENOUS | Status: DC | PRN

## 2021-10-22 MED ORDER — VANCOMYCIN HCL IN DEXTROSE 1-5 GM/200ML-% IV SOLN
1000.0000 mg | INTRAVENOUS | Status: DC
  Filled 2021-10-22: qty 200

## 2021-10-22 MED ORDER — HEPARIN SODIUM (PORCINE) 1000 UNIT/ML DIALYSIS
1000.0000 [IU] | INTRAMUSCULAR | Status: DC | PRN
  Filled 2021-10-22: qty 1

## 2021-10-22 MED ORDER — VANCOMYCIN HCL IN DEXTROSE 1-5 GM/200ML-% IV SOLN
1000.0000 mg | Freq: Once | INTRAVENOUS | Status: AC
Start: 2021-10-22 — End: 2021-10-22
  Administered 2021-10-22: 1000 mg via INTRAVENOUS
  Filled 2021-10-22: qty 200

## 2021-10-22 MED ORDER — LIDOCAINE-PRILOCAINE 2.5-2.5 % EX CREA: 1.0000 "application " | TOPICAL_CREAM | CUTANEOUS | Status: DC | PRN

## 2021-10-22 NOTE — Consult Note (Signed)
[facility] ?Renal Consultation Note  ?  ?Indication for Consultation:  Management of ESRD/hemodialysis; anemia, hypertension/volume and secondary hyperparathyroidism ? ?PCP:Pcp, No ? ?HPI: Brooke Baldwin is a 46 y.o. female with ESRD on HD MWF at [facility]. Her past medical history significant for HIV, HTN, and vulvar/cervical CIN. ? ?Patient presents to the ED c/o redness/tenderness around HD cath site tracking up chest/shoulder/neck. Noted her last HD treatment on 10/18/21. Seen and examined patient at bedside. She reports not reaching her EDW d/t issues with cramping. She reports gaining weight. She endorses pain around HD catheter site going up her r-side of neck. Denies fevers, chills, SOB, CP, and N/V. Currently afebrile, on RA, and Bps stable. Blood cultures X 2 obtained on 3/18. Now on IV Vanc and Cefepime. Recent labs stable with no evidence for leukocytosis. CXR (-) for pulmonary changes but HD catheter now over mid-SVC. VVS also consulted. Plan for HD today then remove [facility] for line holiday. ? ?Past Medical History:  ?Diagnosis Date  ? Anemia associated with chronic renal failure   ? 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 CIN I (cervical intraepithelial neoplasia I)   ? ESRD (end stage renal disease) on dialysis [facility]) 05/2020  ? hemodialysis  T/ Th/ Sat  @ [facility] ([address] in [place], ( 10-17-2020  s/p left AVG 11/ 2021 unable to use as of yet, currently using right IJ tunneled cath (right side of upper chest) for dialysis  ? FSGS (focal segmental glomerulosclerosis)   ? renal biopsy 04-15-2020 in epic , secondary to HTN/ HIV/ Obese  ? Heart palpitations 11/ 2021  (10-17-2020  pt stated the palpitations does not bother her and has no other symptoms , stated they started 11/ 2021)  ? pt was been seen by cardiology, dr s. Patria Mane ([facility] in [place]),  per pt had event monitor for 17 days , was told normal with  no arrhythmias,  and stated echo is scheduled for 10-28-2020   ? History of vulvar dysplasia   ? HIV (human immunodeficiency virus infection) ([place])   ? followed by ID-- dr a. wallace  ? Hypertension   ? S/P arteriovenous (AV) fistula creation 07-04-2020 [facility]   ? left forearm,  07-28-2020  angioplasty of thrombosis  ? Secondary hyperparathyroidism of renal origin [facility])   ? Tachycardia   ? Thrombocytopenia ([place]) 07/01/2020  ? Trigeminal neuralgia   ? ride side ---  neurologist--- dr Krista Blue   (10-17-2020  pt stated last episode about 4 months ago was mild)  ? VIN II (vulvar intraepithelial neoplasia II)   ? ?Past Surgical History:  ?Procedure Laterality Date  ? A/V FISTULAGRAM Left 07/28/2020  ? Procedure: A/V FISTULAGRAM;  Surgeon: Marty Heck, MD;  Location: [place] [facility];  Service: Cardiovascular;  Laterality: Left;  ? AV FISTULA PLACEMENT Left 07/04/2020  ? Procedure: INSERTION OF LEFT ARTERIOVENOUS (AV) GORE-TEX GRAFT ARM (ACUSEAL);  Surgeon: Cherre Robins, MD;  Location: [place];  Service: Vascular;  Laterality: Left;  ? CERVICAL BIOPSY  W/ LOOP ELECTRODE EXCISION  2005  ? CO2 LASER APPLICATION N/A 7/82/9562  ? Procedure: CO2 LASER APPLICATION;  Surgeon: Everitt Amber, MD;  Location: [facility];  Service: Gynecology;  Laterality: N/A;  ? INSERTION OF DIALYSIS CATHETER N/A 08/14/2021  ? Procedure: TUNNELED DIALYSIS CATHETER EXCHANGE;  Surgeon: Angelia Mould, MD;  Location: [place];  Service: Vascular;  Laterality: N/A;  ? ORIF ANKLE FRACTURE Right 2012  ? per pt  retained hardware  ? PERIPHERAL VASCULAR BALLOON ANGIOPLASTY Left 07/28/2020  ? Procedure: PERIPHERAL VASCULAR BALLOON ANGIOPLASTY;  Surgeon: Marty Heck, MD;  Location: [place] [facility];  Service: Cardiovascular;  Laterality: Left;  AVF  ? VULVA SURGERY  2013  ? excision  ? ?Family History  ?Problem Relation Age of Onset  ? Diabetes Mother   ? Healthy Father   ? Breast cancer Maternal Aunt   ? Pancreatic cancer Maternal Grandfather   ? Colon cancer Neg Hx   ? Colon polyps Neg  Hx   ? Esophageal cancer Neg Hx   ? Rectal cancer Neg Hx   ? Stomach cancer Neg Hx   ? Prostate cancer Neg Hx   ? Endometrial cancer Neg Hx   ? Ovarian cancer Neg Hx   ? ?Social History: ? reports that she has never smoked. She has never used smokeless tobacco. She reports that she does not currently use alcohol. She reports that she does not currently use drugs. ?Allergies  ?Allergen Reactions  ? Baclofen Other (See Comments)  ?  Caused intolerable dizziness   ? Gabapentin Other (See Comments)  ?  Caused intolerable dizziness  ? Penicillins Other (See Comments)  ?  Unknown childhood reaction ?  ? ?Prior to Admission medications   ?Medication Sig Start Date End Date Taking? Authorizing Provider  ?bictegravir-emtricitabine-tenofovir AF (BIKTARVY) 50-200-25 MG TABS tablet Take 1 tablet by mouth at bedtime. 10/12/21 10/07/22 Yes Mignon Pine, DO  ?Caffeine-Magnesium Salicylate (DIUREX PO) Take 1 tablet by mouth daily.   Yes [provider]  ?ferric citrate (AURYXIA) 1 GM 210 MG(Fe) tablet Take 420 mg by mouth See admin instructions. Take 2 tablets (420 mg) by mouth once or twice daily with meals   Yes [provider]  ?multivitamin (RENA-VIT) TABS tablet Take 1 tablet by mouth at bedtime.   Yes [provider]  ?atovaquone (MEPRON) 750 MG/5ML suspension Take 10 mLs (1,500 mg total) by mouth daily. ?Patient not taking: Reported on 10/22/2021 10/12/21   Mignon Pine, DO  ?mupirocin ointment (BACTROBAN) 2 % Place 1 application into the nose 2 (two) times daily. ?Patient not taking: Reported on 10/22/2021 08/14/21   Swayze, Ava, DO  ? ?Current Facility-Administered Medications  ?Medication Dose Route Frequency Provider Last Rate Last Admin  ? 0.9 %  sodium chloride infusion  100 mL Intravenous PRN Adelfa Koh, NP      ? 0.9 %  sodium chloride infusion  100 mL Intravenous PRN Adelfa Koh, NP      ? acetaminophen (TYLENOL) tablet 650 mg  650 mg Oral Q6H PRN Etta Quill, DO       ? Or  ? acetaminophen (TYLENOL) suppository 650 mg  650 mg Rectal Q6H PRN Etta Quill, DO      ? alteplase (CATHFLO ACTIVASE) injection 2 mg  2 mg Intracatheter Once PRN Adelfa Koh, NP      ? atovaquone (MEPRON) 750 MG/5ML suspension 1,500 mg  1,500 mg Oral Daily Jennette Kettle M, DO   1,500 mg at 10/22/21 9622  ? bictegravir-emtricitabine-tenofovir AF (BIKTARVY) 50-200-25 MG per tablet 1 tablet  1 tablet Oral QHS Etta Quill, DO   1 tablet at 10/21/21 2247  ? ceFEPIme (MAXIPIME) 1 g in sodium chloride 0.9 % 100 mL IVPB  1 g Intravenous Q24H Bertis Ruddy, RPH      ? Chlorhexidine Gluconate Cloth 2 % PADS 6 each  6 each Topical Daily Jennette Kettle  M, DO   6 each at 10/22/21 5697  ? heparin injection 1,000 Units  1,000 Units Dialysis PRN Adelfa Koh, NP      ? heparin injection 5,000 Units  5,000 Units Subcutaneous Q8H Etta Quill, DO   5,000 Units at 10/22/21 9480  ? lidocaine (PF) (XYLOCAINE) 1 % injection 5 mL  5 mL Intradermal PRN Adelfa Koh, NP      ? lidocaine-prilocaine (EMLA) cream 1 application.  1 application. Topical PRN Adelfa Koh, NP      ? multivitamin (RENA-VIT) tablet 1 tablet  1 tablet Oral QHS Etta Quill, DO   1 tablet at 10/21/21 2246  ? ondansetron (ZOFRAN) tablet 4 mg  4 mg Oral Q6H PRN Etta Quill, DO      ? Or  ? ondansetron (ZOFRAN) injection 4 mg  4 mg Intravenous Q6H PRN Etta Quill, DO      ? pentafluoroprop-tetrafluoroeth (GEBAUERS) aerosol 1 application.  1 application. Topical PRN Adelfa Koh, NP      ? Derrill Memo ON 10/24/2021] vancomycin (VANCOCIN) IVPB 1000 mg/200 mL premix  1,000 mg Intravenous Q T,Th,Sa-HD Bertis Ruddy, RPH      ? ?Labs: ?Basic Metabolic Panel: ?Recent Labs  ?Lab 10/21/21 ?1823 10/21/21 ?2349  ?NA 135 135  ?K 5.3* 4.4  ?CL 104 109  ?CO2 19* 16*  ?GLUCOSE 80 177*  ?BUN 55* 57*  ?CREATININE 6.62* 6.46*  ?CALCIUM 9.6 9.1  ? ?Liver Function Tests: ?Recent Labs  ?Lab 10/21/21 ?1823  ?AST 40   ?ALT 46*  ?ALKPHOS 136*  ?BILITOT 0.4  ?PROT 7.8  ?ALBUMIN 3.5  ? ?Recent Labs  ?Lab 10/21/21 ?1823  ?LIPASE 107*  ? ?No results for input(s): AMMONIA in the last 168 hours. ?CBC: ?Recent Labs  ?L

## 2021-10-22 NOTE — Consult Note (Signed)
[facility] [place] ? ?ASSESSMENT / PLAN: ?46 y.o. female with likely infected tunneled dialysis catheter. Will leave in place for the moment so she can receive HD. Will then plan for [facility] removal and possible line holiday based on nephrology preferences.  ? ?CHIEF COMPLAINT: TDC loose ? ?HISTORY OF PRESENT ILLNESS: ?Brooke Baldwin is a 46 y.o. female admitted to the internal medicine service for possible tunneled dialysis catheter infection.  The patient has been tolerating dialysis through this catheter.  Her last dialysis session was Wednesday.  The patient noted that the catheter was loose and there was pain and swelling around the tunnel tract. ? ?Past Medical History:  ?Diagnosis Date  ? Anemia associated with chronic renal failure   ? 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 CIN I (cervical intraepithelial neoplasia I)   ? ESRD (end stage renal disease) on dialysis [facility]) 05/2020  ? hemodialysis  T/ Th/ Sat  @ [facility] ([address] in [place], ( 10-17-2020  s/p left AVG 11/ 2021 unable to use as of yet, currently using right IJ tunneled cath (right side of upper chest) for dialysis  ? FSGS (focal segmental glomerulosclerosis)   ? renal biopsy 04-15-2020 in epic , secondary to HTN/ HIV/ Obese  ? Heart palpitations 11/ 2021  (10-17-2020  pt stated the palpitations does not bother her and has no other symptoms , stated they started 11/ 2021)  ? pt was been seen by cardiology, dr s. Patria Mane ([facility] in [place]),  per pt had event monitor for 17 days , was told normal with  no arrhythmias,  and stated echo is scheduled for 10-28-2020  ? History of vulvar dysplasia   ? HIV (human immunodeficiency virus infection) ([place])   ? followed by ID-- dr a. wallace  ? Hypertension   ? S/P arteriovenous (AV) fistula creation 07-04-2020 [facility]   ? left forearm,  07-28-2020  angioplasty of thrombosis  ? Secondary hyperparathyroidism of renal origin [facility])   ? Tachycardia   ? Thrombocytopenia ([place]) 07/01/2020  ?  Trigeminal neuralgia   ? ride side ---  neurologist--- dr Krista Blue   (10-17-2020  pt stated last episode about 4 months ago was mild)  ? VIN II (vulvar intraepithelial neoplasia II)   ? ? ?Past Surgical History:  ?Procedure Laterality Date  ? A/V FISTULAGRAM Left 07/28/2020  ? Procedure: A/V FISTULAGRAM;  Surgeon: Marty Heck, MD;  Location: [place] [facility];  Service: Cardiovascular;  Laterality: Left;  ? AV FISTULA PLACEMENT Left 07/04/2020  ? Procedure: INSERTION OF LEFT ARTERIOVENOUS (AV) GORE-TEX GRAFT ARM (ACUSEAL);  Surgeon: Cherre Robins, MD;  Location: [place];  Service: Vascular;  Laterality: Left;  ? CERVICAL BIOPSY  W/ LOOP ELECTRODE EXCISION  2005  ? CO2 LASER APPLICATION N/A 2/83/6629  ? Procedure: CO2 LASER APPLICATION;  Surgeon: Everitt Amber, MD;  Location: [facility];  Service: Gynecology;  Laterality: N/A;  ? INSERTION OF DIALYSIS CATHETER N/A 08/14/2021  ? Procedure: TUNNELED DIALYSIS CATHETER EXCHANGE;  Surgeon: Angelia Mould, MD;  Location: [place];  Service: Vascular;  Laterality: N/A;  ? ORIF ANKLE FRACTURE Right 2012  ? per pt retained hardware  ? PERIPHERAL VASCULAR BALLOON ANGIOPLASTY Left 07/28/2020  ? Procedure: PERIPHERAL VASCULAR BALLOON ANGIOPLASTY;  Surgeon: Marty Heck, MD;  Location: [place] [facility];  Service: Cardiovascular;  Laterality: Left;  AVF  ? VULVA SURGERY  2013  ? excision  ? ? ?Family History  ?Problem Relation Age  of Onset  ? Diabetes Mother   ? Healthy Father   ? Breast cancer Maternal Aunt   ? Pancreatic cancer Maternal Grandfather   ? Colon cancer Neg Hx   ? Colon polyps Neg Hx   ? Esophageal cancer Neg Hx   ? Rectal cancer Neg Hx   ? Stomach cancer Neg Hx   ? Prostate cancer Neg Hx   ? Endometrial cancer Neg Hx   ? Ovarian cancer Neg Hx   ? ? ?Social History  ? ?Socioeconomic History  ? Marital status: Single  ?  Spouse name: Not on file  ? Number of children: 0  ? Years of education: college  ? Highest education level:  Bachelor's degree (e.g., BA, AB, BS)  ?Occupational History  ? Occupation: Administrator, arts  ?Tobacco Use  ? Smoking status: Never  ? Smokeless tobacco: Never  ?Vaping Use  ? Vaping Use: Never used  ?Substance and Sexual Activity  ? Alcohol use: Not Currently  ? Drug use: Not Currently  ? Sexual activity: Not Currently  ?  Birth control/protection: Post-menopausal  ?Other Topics Concern  ? Not on file  ?Social History Narrative  ? Lives at home with significant other.  ? Right-handed.  ? No daily caffeine use.  ? ?Social Determinants of Health  ? ?Financial Resource Strain: Not on file  ?Food Insecurity: Not on file  ?Transportation Needs: Not on file  ?Physical Activity: Not on file  ?Stress: Not on file  ?Social Connections: Not on file  ?Intimate Partner Violence: Not on file  ? ? ?Allergies  ?Allergen Reactions  ? Baclofen Other (See Comments)  ?  Caused intolerable dizziness   ? Gabapentin Other (See Comments)  ?  Caused intolerable dizziness  ? Penicillins Other (See Comments)  ?  Unknown childhood reaction ?  ? ? ?Current Facility-Administered Medications  ?Medication Dose Route Frequency Provider Last Rate Last Admin  ? 0.9 %  sodium chloride infusion  100 mL Intravenous PRN Adelfa Koh, NP      ? 0.9 %  sodium chloride infusion  100 mL Intravenous PRN Adelfa Koh, NP      ? acetaminophen (TYLENOL) tablet 650 mg  650 mg Oral Q6H PRN Etta Quill, DO      ? Or  ? acetaminophen (TYLENOL) suppository 650 mg  650 mg Rectal Q6H PRN Etta Quill, DO      ? alteplase (CATHFLO ACTIVASE) injection 2 mg  2 mg Intracatheter Once PRN Adelfa Koh, NP      ? atovaquone (MEPRON) 750 MG/5ML suspension 1,500 mg  1,500 mg Oral Daily Jennette Kettle M, DO   1,500 mg at 10/22/21 0109  ? bictegravir-emtricitabine-tenofovir AF (BIKTARVY) 50-200-25 MG per tablet 1 tablet  1 tablet Oral QHS Etta Quill, DO   1 tablet at 10/21/21 2247  ? ceFEPIme (MAXIPIME) 1 g in sodium chloride 0.9 % 100 mL IVPB   1 g Intravenous Q24H Bertis Ruddy, RPH      ? Chlorhexidine Gluconate Cloth 2 % PADS 6 each  6 each Topical Daily Etta Quill, DO   6 each at 10/22/21 3235  ? heparin injection 1,000 Units  1,000 Units Dialysis PRN Adelfa Koh, NP      ? heparin injection 5,000 Units  5,000 Units Subcutaneous Q8H Etta Quill, DO   5,000 Units at 10/22/21 5732  ? lidocaine (PF) (XYLOCAINE) 1 % injection 5 mL  5 mL Intradermal PRN Tobie Poet  E, NP      ? lidocaine-prilocaine (EMLA) cream 1 application.  1 application. Topical PRN Adelfa Koh, NP      ? multivitamin (RENA-VIT) tablet 1 tablet  1 tablet Oral QHS Etta Quill, DO   1 tablet at 10/21/21 2246  ? ondansetron (ZOFRAN) tablet 4 mg  4 mg Oral Q6H PRN Etta Quill, DO      ? Or  ? ondansetron (ZOFRAN) injection 4 mg  4 mg Intravenous Q6H PRN Etta Quill, DO      ? pentafluoroprop-tetrafluoroeth (GEBAUERS) aerosol 1 application.  1 application. Topical PRN Adelfa Koh, NP      ? Derrill Memo ON 10/24/2021] vancomycin (VANCOCIN) IVPB 1000 mg/200 mL premix  1,000 mg Intravenous Q T,Th,Sa-HD Bertis Ruddy, RPH      ? ? ?PHYSICAL EXAM ?Vitals:  ? 10/21/21 2214 10/22/21 0227 10/22/21 0610 10/22/21 5465  ?BP: (!) 156/97 135/87 (!) 136/96 138/86  ?Pulse: 87 87 95 85  ?Resp: '18 20 18 18  '$ ?Temp: 98.5 ?F (36.9 ?C) 98.4 ?F (36.9 ?C) 98.9 ?F (37.2 ?C) 98.1 ?F (36.7 ?C)  ?TempSrc: Oral Oral Oral Oral  ?SpO2: 100% 100% 100% 100%  ?Height: '5\' 2"'$  (1.575 m)     ? ? ?Constitutional: well appearing. no distress. Appears well nourished.  ?Neurologic: CN intact. no focal findings. no sensory loss. ?Psychiatric:  Mood and affect symmetric and appropriate. ?Eyes:  No icterus. No conjunctival pallor. ?Ears, nose, throat:  mucous membranes moist. Midline trachea.  ?Cardiac: regular rate and rhythm.  ?Respiratory:  unlabored. ?Abdominal:  soft, non-tender, non-distended.  ?Peripheral vascular: TDC in place in right chest. Edema and tenderness about  the catheter in the chest / neck. No frank purulence.  ?Extremity: no cyanosis. no pallor.  ?Skin: no gangrene. no ulceration.  ?Lymphatic: no Stemmer's sign. no palpable lymphadenopathy. ? ?PERTINENT [facility]

## 2021-10-22 NOTE — Procedures (Signed)
DATE OF SERVICE: 10/22/2021 ? ?PATIENT:  Brooke Baldwin  45 y.o. female ? ?PRE-PROCEDURE DIAGNOSIS:  infected tunneled dialysis catheter ? ?POST-PROCEDURE DIAGNOSIS:  Same ? ?PROCEDURE:   ?Removal of tunneled dialysis catheter ? ?SURGEON:  Trilby Leaver, MD ? ?ANESTHESIA:   local ? ?EBL: minimal ? ?BLOOD ADMINISTERED:none ? ?DRAINS: none  ? ?LOCAL MEDICATIONS USED:  LIDOCAINE  ? ?SPECIMEN:  catheter tip for culture ? ?TOURNIQUET:  none ? ?PATIENT DISPOSITION:  PACU - hemodynamically stable. ?  ?Delay start of Pharmacological VTE agent (>24hrs) due to surgical blood loss or risk of bleeding: no ? ?INDICATION FOR PROCEDURE: [name] is a 46 y.o. female with infected tunneled dialysis catheter. ? ?OPERATIVE FINDINGS: catheter intact. No gross infection. Catheter tip sent for culture / gram stain. ? ?DESCRIPTION OF PROCEDURE:  ?After obtaining verbal consent, the skin overlying the catheter exit site was cleaned with CHG solution.  20 mL of 1% lidocaine was infiltrated around the catheter exit site.  A Metzenbaum scissor was used to break up dense scar tissue adherent to the cuff near the exit site.  The catheter was removed in 1 piece.  The tip was cut free from the catheter and placed in a specimen cup for Gram stain and culture evaluation.  A clean bandage was applied to the catheter exit site.  Manual pressure was held to the neck for 3 minutes.  The patient tolerated the procedure well. ? ?[name]. Stanford Breed, MD ?[facility] [place] ?Office Phone Number: [phone] ?10/22/2021 4:04 PM ? ?

## 2021-10-22 NOTE — Progress Notes (Signed)
?PROGRESS NOTE ? ? ? ?Brooke Baldwin  XTG:[id number] DOB: [birth year] DOA: 10/21/2021 ?PCP: Pcp, No ? ? ?Brief Narrative:  ?HPI: Brooke Baldwin is a 46 y.o. 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 ?  ?ESRD on HD for ~1 yr. ?  ?HIV = CD4 of 153 last month, undetectable VL on ART. ?  ?Pt undergoing dialysis via tunneled IJ replaced in Jan. ?  ?Pt presents to ED with c/o lose dialysis catheter that can slide in and out when she moves.  Over last several days has developed increasing erythema and tenderness along catheter site tracking up on upper R chest toward shoulder and neck. ?  ?No fevers, chills, N/V or other systemic symptoms, no drainage. ?  ?Went to UC who sent her into ED ? ?Assessment & Plan: ?  ?Principal Problem: ?  Central line insertion site infection ?Active Problems: ?  At risk for central line-associated bloodstream infection (CLABSI) ?  End stage renal disease ([place]) ?  HIV (human immunodeficiency virus infection) ([place]) ? ?Central line insertion site infection: According to H&P reportedly she had some cellulitis around central line insertion site however on my examination, I was not able to appreciate any erythema.  She does have significant tenderness at the site and going all the way up in the shoulder and the neck.  High chance of infection.  We will continue current antibiotics.  Vascular and nephrology on board.  Reportedly plan is to do dialysis today and give her line holiday.  I will recommend sending the tip of the line for culture as well. Blood culture negative so far. ? ?ESRD on HD: Nephrology on board. ? ?HIV: Continue medications. ? ?DVT prophylaxis: heparin injection 5,000 Units Start: 10/21/21 2200 ?  Code Status: Full Code  ?Family Communication:  None present at bedside.  Plan of care discussed with patient in length and he/she verbalized understanding and agreed with it. ? ?Status is: Observation ?The patient will require care spanning > 2  midnights and should be moved to inpatient because: Will need a couple of days of hospitalization to find out the culture reports, will need another line by IR after line holiday. ? ? ?Estimated body mass index is 34.2 kg/m? as calculated from the following: ?  Height as of this encounter: '5\' 2"'$  (1.575 m). ?  Weight as of 10/12/21: 84.8 kg. ? ?  ?Nutritional Assessment: ?Body mass index is 34.2 kg/m?Marland KitchenMarland Kitchen ?Seen by dietician.  I agree with the assessment and plan as outlined below: ?Nutrition Status: ? ?. ?Skin Assessment: ?I have examined the patient's skin and I agree with the wound assessment as performed by the wound care RN as outlined below: ?  ? ?Consultants:  ?Nephrology ?Vascular surgery ?Procedures:  ?None ? ?Antimicrobials:  ?Anti-infectives (From admission, onward)  ? ? Start     Dose/Rate Route Frequency Ordered Stop  ? 10/24/21 1200  vancomycin (VANCOCIN) IVPB 1000 mg/200 mL premix       ? 1,000 mg ?200 mL/hr over 60 Minutes Intravenous Every T-Th-Sa (Hemodialysis) 10/21/21 2054    ? 10/22/21 2200  ceFEPIme (MAXIPIME) 1 g in sodium chloride 0.9 % 100 mL IVPB       ? 1 g ?200 mL/hr over 30 Minutes Intravenous Every 24 hours 10/21/21 2104    ? 10/22/21 1000  atovaquone (MEPRON) 750 MG/5ML suspension 1,500 mg       ? 1,500 mg Oral Daily 10/21/21 2109    ?  10/21/21 2200  bictegravir-emtricitabine-tenofovir AF (BIKTARVY) 50-200-25 MG per tablet 1 tablet       ? 1 tablet Oral Daily at bedtime 10/21/21 2108    ? 10/21/21 2115  ceFEPIme (MAXIPIME) 2 g in sodium chloride 0.9 % 100 mL IVPB       ? 2 g ?200 mL/hr over 30 Minutes Intravenous  Once 10/21/21 2104 10/22/21 0009  ? 10/21/21 2100  piperacillin-tazobactam (ZOSYN) IVPB 3.375 g  Status:  Discontinued       ? 3.375 g ?100 mL/hr over 30 Minutes Intravenous  Once 10/21/21 2047 10/21/21 2059  ? 10/21/21 2100  vancomycin (VANCOREADY) IVPB 2000 mg/400 mL       ? 2,000 mg ?200 mL/hr over 120 Minutes Intravenous  Once 10/21/21 2054 10/22/21 0236  ? ?  ?   ? ? ?Subjective: ?Seen and examined.  Complains of pain at the right anterior chest where the line is inserted.  No other complaint. ? ?Objective: ?Vitals:  ? 10/21/21 2214 10/22/21 0227 10/22/21 0610 10/22/21 9798  ?BP: (!) 156/97 135/87 (!) 136/96 138/86  ?Pulse: 87 87 95 85  ?Resp: '18 20 18 18  '$ ?Temp: 98.5 ?F (36.9 ?C) 98.4 ?F (36.9 ?C) 98.9 ?F (37.2 ?C) 98.1 ?F (36.7 ?C)  ?TempSrc: Oral Oral Oral Oral  ?SpO2: 100% 100% 100% 100%  ?Height: '5\' 2"'$  (1.575 m)     ? ? ?Intake/Output Summary (Last 24 hours) at 10/22/2021 1027 ?Last data filed at 10/22/2021 0900 ?Gross per 24 hour  ?Intake 340 ml  ?Output --  ?Net 340 ml  ? ?There were no vitals filed for this visit. ? ?Examination: ? ?General exam: Appears calm and comfortable  ?Respiratory system: Clear to auscultation. Respiratory effort normal.  No erythema around dialysis line insertion site but she is having significant tenderness around the area. ?Cardiovascular system: S1 & S2 heard, RRR. No JVD, murmurs, rubs, gallops or clicks. No pedal edema. ?Gastrointestinal system: Abdomen is nondistended, soft and nontender. No organomegaly or masses felt. Normal bowel sounds heard. ?Central nervous system: Alert and oriented. No focal neurological deficits. ?Extremities: Symmetric 5 x 5 power. ?Skin: No rashes, lesions or ulcers ?Psychiatry: Judgement and insight appear normal. Mood & affect appropriate.  ? ? ?Data Reviewed: I have personally reviewed following labs and imaging studies ? ?CBC: ?Recent Labs  ?Lab 10/21/21 ?1823 10/21/21 ?2349  ?WBC 8.3 7.7  ?NEUTROABS 5.4  --   ?HGB 12.5 11.6*  ?HCT 38.9 35.4*  ?MCV 106.0* 104.1*  ?PLT 195 168  ? ?Basic Metabolic Panel: ?Recent Labs  ?Lab 10/21/21 ?1823 10/21/21 ?2349  ?NA 135 135  ?K 5.3* 4.4  ?CL 104 109  ?CO2 19* 16*  ?GLUCOSE 80 177*  ?BUN 55* 57*  ?CREATININE 6.62* 6.46*  ?CALCIUM 9.6 9.1  ? ?GFR: ?Estimated Creatinine Clearance: 10.9 mL/min (A) (by C-G formula based on SCr of 6.46 mg/dL (H)). ?Liver Function  Tests: ?Recent Labs  ?Lab 10/21/21 ?1823  ?AST 40  ?ALT 46*  ?ALKPHOS 136*  ?BILITOT 0.4  ?PROT 7.8  ?ALBUMIN 3.5  ? ?Recent Labs  ?Lab 10/21/21 ?1823  ?LIPASE 107*  ? ?No results for input(s): AMMONIA in the last 168 hours. ?Coagulation Profile: ?No results for input(s): INR, PROTIME in the last 168 hours. ?Cardiac Enzymes: ?No results for input(s): CKTOTAL, CKMB, CKMBINDEX, TROPONINI in the last 168 hours. ?BNP (last 3 results) ?No results for input(s): PROBNP in the last 8760 hours. ?HbA1C: ?No results for input(s): HGBA1C in the last 72 hours. ?CBG: ?No  results for input(s): GLUCAP in the last 168 hours. ?Lipid Profile: ?No results for input(s): CHOL, HDL, LDLCALC, TRIG, CHOLHDL, LDLDIRECT in the last 72 hours. ?Thyroid Function Tests: ?No results for input(s): TSH, T4TOTAL, FREET4, T3FREE, THYROIDAB in the last 72 hours. ?Anemia Panel: ?No results for input(s): VITAMINB12, FOLATE, FERRITIN, TIBC, IRON, RETICCTPCT in the last 72 hours. ?Sepsis Labs: ?Recent Labs  ?Lab 10/21/21 ?1823 10/21/21 ?2147 10/22/21 ?0150  ?LATICACIDVEN 1.3 2.4* 0.8  ? ? ?Recent Results (from the past 240 hour(s))  ?Blood culture (routine x 2)     Status: None (Preliminary result)  ? Collection Time: 10/21/21  9:30 PM  ? Specimen: BLOOD  ?Result Value Ref Range Status  ? Specimen Description BLOOD RIGHT HAND  Final  ? Special Requests   Final  ?  BOTTLES DRAWN AEROBIC AND ANAEROBIC Blood Culture results may not be optimal due to an inadequate volume of blood received in culture bottles  ? Culture   Final  ?  NO GROWTH < 12 HOURS ?Performed at [facility], [place] [address] [place], [place] [address] ?  ? Report Status PENDING  Incomplete  ?Blood culture (routine x 2)     Status: None (Preliminary result)  ? Collection Time: 10/21/21 11:49 PM  ? Specimen: BLOOD  ?Result Value Ref Range Status  ? Specimen Description BLOOD LEFT HAND  Final  ? Special Requests   Final  ?  BOTTLES DRAWN AEROBIC AND ANAEROBIC Blood Culture results may not  be optimal due to an inadequate volume of blood received in culture bottles  ? Culture   Final  ?  NO GROWTH < 12 HOURS ?Performed at [facility], [place] [address], [place], [place] [address] ?  ? Report Status PENDI

## 2021-10-23 DIAGNOSIS — T80212A Local infection due to central venous catheter, initial encounter: Secondary | ICD-10-CM | POA: Diagnosis not present

## 2021-10-23 MED ORDER — RENA-VITE PO TABS
1.0000 | ORAL_TABLET | Freq: Every morning | ORAL | Status: DC
  Administered 2021-10-24: 1 via ORAL
  Filled 2021-10-23: qty 1

## 2021-10-23 NOTE — Progress Notes (Signed)
?[facility] ?Progress Note  ? ?Subjective: Up in chair. [place] removed 10/22/2021. RIJ area still tender to touch. No C/Os. Refusing to have AVG placed.   ? ?Objective ?Vitals:  ? 10/22/21 1812 10/22/21 2030 10/23/21 0456 10/23/21 0915  ?BP: (!) 115/93 (!) 122/93 112/76 111/80  ?Pulse: 100 (!) 106 90 86  ?Resp: '19 20 18 16  '$ ?Temp: 98.8 ?F (37.1 ?C) 99.1 ?F (37.3 ?C) 99 ?F (37.2 ?C) 98.4 ?F (36.9 ?C)  ?TempSrc: Oral Oral Oral   ?SpO2: 100% 100% 100% 100%  ?Weight:      ?Height:      ? ?Physical Exam ?General: Pleasant, WN, WD NAD ?Heart: S1,S2 RRR No M/R/G ?Lungs: CTAB A/P ?Abdomen: Soft, NABS ?Extremities: No LE edema ?Dialysis Access: None at present ? ? ?Additional Objective ?Labs: ?Basic Metabolic Panel: ?Recent Labs  ?Lab 10/21/21 ?1823 10/21/21 ?2349  ?NA 135 135  ?K 5.3* 4.4  ?CL 104 109  ?CO2 19* 16*  ?GLUCOSE 80 177*  ?BUN 55* 57*  ?CREATININE 6.62* 6.46*  ?CALCIUM 9.6 9.1  ? ?Liver Function Tests: ?Recent Labs  ?Lab 10/21/21 ?1823  ?AST 40  ?ALT 46*  ?ALKPHOS 136*  ?BILITOT 0.4  ?PROT 7.8  ?ALBUMIN 3.5  ? ?Recent Labs  ?Lab 10/21/21 ?1823  ?LIPASE 107*  ? ?CBC: ?Recent Labs  ?Lab 10/21/21 ?1823 10/21/21 ?2349  ?WBC 8.3 7.7  ?NEUTROABS 5.4  --   ?HGB 12.5 11.6*  ?HCT 38.9 35.4*  ?MCV 106.0* 104.1*  ?PLT 195 168  ? ?Blood Culture ?   ?Component Value Date/Time  ? SDES BLOOD LEFT HAND 10/21/2021 2349  ? SPECREQUEST  10/21/2021 2349  ?  BOTTLES DRAWN AEROBIC AND ANAEROBIC Blood Culture results may not be optimal due to an inadequate volume of blood received in culture bottles  ? CULT  10/21/2021 2349  ?  NO GROWTH 1 DAY ?Performed at [facility], [place] [address], [place], [place] [address] ?  ? REPTSTATUS PENDING 10/21/2021 2349  ? ? ?Cardiac Enzymes: ?No results for input(s): CKTOTAL, CKMB, CKMBINDEX, TROPONINI in the last 168 hours. ?CBG: ?No results for input(s): GLUCAP in the last 168 hours. ?Iron Studies: No results for input(s): IRON, TIBC, TRANSFERRIN, FERRITIN in the last 72  hours. ?'@lablastinr3'$ @ ?Studies/Results: ?DG Chest 2 View ? ?Result Date: 10/21/2021 ?CLINICAL DATA:  Pain around tunneled dialysis catheter EXAM: CHEST - 2 VIEW COMPARISON:  08/14/2021 FINDINGS: The heart size and mediastinal contours are within normal limits. Large-bore right neck multi lumen vascular catheter, tip appears somewhat retracted compared to prior examination, now over the mid SVC. Both lungs are clear. The visualized skeletal structures are unremarkable. IMPRESSION: 1. No acute abnormality of the lungs. 2. Large-bore right neck multi lumen vascular catheter, tip appears somewhat retracted compared to prior examination, now over the mid SVC. Electronically Signed   By: Delanna Ahmadi M.D.   On: 10/21/2021 18:49   ?Medications: ? ceFEPime (MAXIPIME) IV 1 g (10/22/21 2226)  ? [START ON 10/25/2021] vancomycin    ? ? atovaquone  1,500 mg Oral Daily  ? bictegravir-emtricitabine-tenofovir AF  1 tablet Oral QHS  ? Chlorhexidine Gluconate Cloth  6 each Topical Daily  ? heparin  5,000 Units Subcutaneous Q8H  ? [START ON 10/24/2021] multivitamin  1 tablet Oral q morning  ? ?HD orders: [facility] MWF ?4:15 hrs 300/[place]oflow 1.5 2.0 K/2.0 Ca 82 kg  ?-Heparin 3000 units IV TIW ?-Calcitriol 2 mcg PO TIW ? ?Assessment/Plan: ?TDC infection-BC X 2 obtained 3/18, on IV  vanc/Cefepime, VVS consulted. BC NGTD 2 days. TDC removed 10/22/2021 for line holiday. Plan for HD Wed or Thursday this week after TDC replaced.. Monitor labs, signs for uremia, and volume status closely. ?ESRD -  on HD MWF. Please see above. Had planned on doing PD but not able to place PD catheter D/T uterine fibroids. Refusing permanent access at this time.  ?Hypertension/volume  - euvolemic on exam, Bps stable. Not reaching OP EDW. May need to increase EDW.  ?Anemia of CKD - Hgb 11.6, no indication for ESA/Fe at this time ?Secondary Hyperparathyroidism -  Ca okay, will check PO4 in AM. Continue binders ?Nutrition - Renal with fluid restriction for now. NPO  depending on when [facility] removal will occur ? ?Rishika Mccollom H. Mikeyla Music NP-C ?10/23/2021, 2:21 PM  ?[facility] ?[phone] ? ? ?  ? ?

## 2021-10-23 NOTE — Plan of Care (Signed)
?  Problem: Clinical Measurements: ?Goal: Respiratory complications will improve ?Outcome: Progressing ?  ?Problem: Activity: ?Goal: Risk for activity intolerance will decrease ?Outcome: Progressing ?  ?Problem: Clinical Measurements: ?Goal: Cardiovascular complication will be avoided ?Outcome: Progressing ?  ?Problem: Clinical Measurements: ?Goal: Respiratory complications will improve ?Outcome: Progressing ?  ?

## 2021-10-23 NOTE — Progress Notes (Addendum)
?  Transition of Care (TOC) Screening Note ? ? ?Patient Details  ?Name: Brooke Baldwin ?Date of Birth: November 23, 1975 ? ? ?Transition of Care (TOC) CM/SW Contact:    ?Tom-Johnson, Renea Ee, RN ?Phone Number: ?10/23/2021, 2:39 PM ? ?CM spoke with patient at bedside about needs for post hospital transition. Admitted for Central line infection. ? From home with fiance. Does not have any children. Currently employed at [employer] and independent with care and drive self prior to admission. Patient states she was originally from [place] and that's where her PCP is. Her PCP is Revonda Standard, MD.patient states she has a scheduled appointment this Thursday with her. States she does not want to use any PCP in [place] because they don't listen to her. Does not mind travelling back and forth To [place] for medical check up. States they are planning to do Catheter replacement this admission and she does not want it done here,prefers to go to [place] to get it done. States she notified MD. Producer, [facility] pharmacy on [address].  ?Transition of Care Department [facility]) has reviewed patient and no TOC needs or recommendations have been identified at this time. TOC will continue to monitor patient advancement through interdisciplinary progression rounds. If new patient transition needs arise, please place a TOC consult. ?  ?

## 2021-10-23 NOTE — Progress Notes (Signed)
?PROGRESS NOTE ? ? ? ?Brooke Baldwin  ZOX:[id number] DOB: Jan 05, 1976 DOA: 10/21/2021 ?PCP: Pcp, No ? ? ?Brief Narrative:  ?HPI: Brooke Baldwin is a 46 y.o. 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 ?  ?ESRD on HD for ~1 yr. ?  ?HIV = CD4 of 153 last month, undetectable VL on ART. ?  ?Pt undergoing dialysis via tunneled IJ replaced in Jan. ?  ?Pt presents to ED with c/o lose dialysis catheter that can slide in and out when she moves.  Over last several days has developed increasing erythema and tenderness along catheter site tracking up on upper R chest toward shoulder and neck. ?  ?No fevers, chills, N/V or other systemic symptoms, no drainage. ?  ?Went to UC who sent her into ED ? ?Assessment & Plan: ?  ?Principal Problem: ?  Central line insertion site infection ?Active Problems: ?  At risk for central line-associated bloodstream infection (CLABSI) ?  End stage renal disease ([place]) ?  HIV (human immunodeficiency virus infection) ([place]) ? ?Central line insertion site infection: HD line was removed by vascular surgery on 10/22/2021 after she received her hemodialysis.  Currently on line holiday.  Plan to insert another line either on Tuesday or Wednesday for another dialysis.  Blood cultures remain negative so far.  Appreciate vascular and nephrology. ? ?ESRD on HD: Nephrology on board. ? ?HIV: Continue medications. ? ?Lactic acidosis: Resolved. ? ?DVT prophylaxis: heparin injection 5,000 Units Start: 10/21/21 2200 ?  Code Status: Full Code  ?Family Communication:  None present at bedside.  Plan of care discussed with patient in length and he/she verbalized understanding and agreed with it. ? ?Status is: Inpatient ?Remains inpatient appropriate because: Needs treatment/IV antibiotics for possible CLABSI. ? ? ? ? ?Estimated body mass index is 33.67 kg/m? as calculated from the following: ?  Height as of this encounter: '5\' 2"'$  (1.575 m). ?  Weight as of this encounter: 83.5  kg. ? ?  ?Nutritional Assessment: ?Body mass index is 33.67 kg/m?Marland KitchenMarland Kitchen ?Seen by dietician.  I agree with the assessment and plan as outlined below: ?Nutrition Status: ? ?. ?Skin Assessment: ?I have examined the patient's skin and I agree with the wound assessment as performed by the wound care RN as outlined below: ?  ? ?Consultants:  ?Nephrology ?Vascular surgery ?Procedures:  ?None ? ?Antimicrobials:  ?Anti-infectives (From admission, onward)  ? ? Start     Dose/Rate Route Frequency Ordered Stop  ? 10/25/21 1200  vancomycin (VANCOCIN) IVPB 1000 mg/200 mL premix       ? 1,000 mg ?200 mL/hr over 60 Minutes Intravenous Every M-W-F (Hemodialysis) 10/22/21 1545    ? 10/24/21 1200  vancomycin (VANCOCIN) IVPB 1000 mg/200 mL premix  Status:  Discontinued       ? 1,000 mg ?200 mL/hr over 60 Minutes Intravenous Every T-Th-Sa (Hemodialysis) 10/21/21 2054 10/22/21 1545  ? 10/22/21 2200  ceFEPIme (MAXIPIME) 1 g in sodium chloride 0.9 % 100 mL IVPB       ? 1 g ?200 mL/hr over 30 Minutes Intravenous Every 24 hours 10/21/21 2104    ? 10/22/21 1700  vancomycin (VANCOCIN) IVPB 1000 mg/200 mL premix       ? 1,000 mg ?200 mL/hr over 60 Minutes Intravenous  Once 10/22/21 1546 10/22/21 2352  ? 10/22/21 1000  atovaquone (MEPRON) 750 MG/5ML suspension 1,500 mg       ? 1,500 mg Oral Daily 10/21/21 2109    ? 10/21/21  2200  bictegravir-emtricitabine-tenofovir AF (BIKTARVY) 50-200-25 MG per tablet 1 tablet       ? 1 tablet Oral Daily at bedtime 10/21/21 2108    ? 10/21/21 2115  ceFEPIme (MAXIPIME) 2 g in sodium chloride 0.9 % 100 mL IVPB       ? 2 g ?200 mL/hr over 30 Minutes Intravenous  Once 10/21/21 2104 10/22/21 0009  ? 10/21/21 2100  piperacillin-tazobactam (ZOSYN) IVPB 3.375 g  Status:  Discontinued       ? 3.375 g ?100 mL/hr over 30 Minutes Intravenous  Once 10/21/21 2047 10/21/21 2059  ? 10/21/21 2100  vancomycin (VANCOREADY) IVPB 2000 mg/400 mL       ? 2,000 mg ?200 mL/hr over 120 Minutes Intravenous  Once 10/21/21 2054 10/22/21 0236   ? ?  ?  ? ? ?Subjective: ?Seen and examined.  Feels slightly better but still with pain in the right upper shoulder and neck.  No other complaint. ? ?Objective: ?Vitals:  ? 10/22/21 1812 10/22/21 2030 10/23/21 0456 10/23/21 0915  ?BP: (!) 115/93 (!) 122/93 112/76 111/80  ?Pulse: 100 (!) 106 90 86  ?Resp: '19 20 18 16  '$ ?Temp: 98.8 ?F (37.1 ?C) 99.1 ?F (37.3 ?C) 99 ?F (37.2 ?C) 98.4 ?F (36.9 ?C)  ?TempSrc: Oral Oral Oral   ?SpO2: 100% 100% 100% 100%  ?Weight:      ?Height:      ? ? ?Intake/Output Summary (Last 24 hours) at 10/23/2021 1046 ?Last data filed at 10/23/2021 0800 ?Gross per 24 hour  ?Intake 960 ml  ?Output 2000 ml  ?Net -1040 ml  ? ? ?Filed Weights  ? 10/22/21 1104 10/22/21 1427  ?Weight: 85.5 kg 83.5 kg  ? ? ?Examination: ? ?General exam: Appears calm and comfortable  ?Respiratory system: Clear to auscultation. Respiratory effort normal.  Tenderness at the right upper chest and neck. ?Cardiovascular system: S1 & S2 heard, RRR. No JVD, murmurs, rubs, gallops or clicks. No pedal edema. ?Gastrointestinal system: Abdomen is nondistended, soft and nontender. No organomegaly or masses felt. Normal bowel sounds heard. ?Central nervous system: Alert and oriented. No focal neurological deficits. ?Extremities: Symmetric 5 x 5 power. ?Skin: No rashes, lesions or ulcers.  ?Psychiatry: Judgement and insight appear normal. Mood & affect appropriate.  ? ?Data Reviewed: I have personally reviewed following labs and imaging studies ? ?CBC: ?Recent Labs  ?Lab 10/21/21 ?1823 10/21/21 ?2349  ?WBC 8.3 7.7  ?NEUTROABS 5.4  --   ?HGB 12.5 11.6*  ?HCT 38.9 35.4*  ?MCV 106.0* 104.1*  ?PLT 195 168  ? ? ?Basic Metabolic Panel: ?Recent Labs  ?Lab 10/21/21 ?1823 10/21/21 ?2349  ?NA 135 135  ?K 5.3* 4.4  ?CL 104 109  ?CO2 19* 16*  ?GLUCOSE 80 177*  ?BUN 55* 57*  ?CREATININE 6.62* 6.46*  ?CALCIUM 9.6 9.1  ? ? ?GFR: ?Estimated Creatinine Clearance: 10.8 mL/min (A) (by C-G formula based on SCr of 6.46 mg/dL (H)). ?Liver Function  Tests: ?Recent Labs  ?Lab 10/21/21 ?1823  ?AST 40  ?ALT 46*  ?ALKPHOS 136*  ?BILITOT 0.4  ?PROT 7.8  ?ALBUMIN 3.5  ? ? ?Recent Labs  ?Lab 10/21/21 ?1823  ?LIPASE 107*  ? ? ?No results for input(s): AMMONIA in the last 168 hours. ?Coagulation Profile: ?No results for input(s): INR, PROTIME in the last 168 hours. ?Cardiac Enzymes: ?No results for input(s): CKTOTAL, CKMB, CKMBINDEX, TROPONINI in the last 168 hours. ?BNP (last 3 results) ?No results for input(s): PROBNP in the last 8760 hours. ?HbA1C: ?No results  for input(s): HGBA1C in the last 72 hours. ?CBG: ?No results for input(s): GLUCAP in the last 168 hours. ?Lipid Profile: ?No results for input(s): CHOL, HDL, LDLCALC, TRIG, CHOLHDL, LDLDIRECT in the last 72 hours. ?Thyroid Function Tests: ?No results for input(s): TSH, T4TOTAL, FREET4, T3FREE, THYROIDAB in the last 72 hours. ?Anemia Panel: ?No results for input(s): VITAMINB12, FOLATE, FERRITIN, TIBC, IRON, RETICCTPCT in the last 72 hours. ?Sepsis Labs: ?Recent Labs  ?Lab 10/21/21 ?1823 10/21/21 ?2147 10/22/21 ?0150  ?LATICACIDVEN 1.3 2.4* 0.8  ? ? ? ?Recent Results (from the past 240 hour(s))  ?Aerobic/Anaerobic Culture w Gram Stain (surgical/deep wound)     Status: None (Preliminary result)  ? Collection Time: 10/21/21  5:43 PM  ? Specimen: Chest; Other  ?Result Value Ref Range Status  ? Specimen Description CHEST  Final  ? Special Requests Immunocompromised  Final  ? Gram Stain   Final  ?  ABUNDANT WBC PRESENT, PREDOMINANTLY PMN ?RARE GRAM POSITIVE COCCI ?  ? Culture   Final  ?  CULTURE REINCUBATED FOR BETTER GROWTH ?Performed at [facility], [place] [address], [place], [place] [address] ?  ? Report Status PENDING  Incomplete  ?Blood culture (routine x 2)     Status: None (Preliminary result)  ? Collection Time: 10/21/21  9:30 PM  ? Specimen: BLOOD  ?Result Value Ref Range Status  ? Specimen Description BLOOD RIGHT HAND  Final  ? Special Requests   Final  ?  BOTTLES DRAWN AEROBIC AND ANAEROBIC Blood  Culture results may not be optimal due to an inadequate volume of blood received in culture bottles  ? Culture   Final  ?  NO GROWTH 2 DAYS ?Performed at [facility], [place] [address], [place], [place] [address] ?  ?

## 2021-10-24 ENCOUNTER — Inpatient Hospital Stay (HOSPITAL_COMMUNITY): Payer: POS

## 2021-10-24 DIAGNOSIS — D849 Immunodeficiency, unspecified: Secondary | ICD-10-CM

## 2021-10-24 DIAGNOSIS — B20 Human immunodeficiency virus [HIV] disease: Secondary | ICD-10-CM | POA: Diagnosis not present

## 2021-10-24 DIAGNOSIS — T80212A Local infection due to central venous catheter, initial encounter: Secondary | ICD-10-CM | POA: Diagnosis not present

## 2021-10-24 DIAGNOSIS — N186 End stage renal disease: Secondary | ICD-10-CM | POA: Diagnosis not present

## 2021-10-24 LAB — RENAL FUNCTION PANEL
Albumin: 3.2 g/dL — ABNORMAL LOW (ref 3.5–5.0)
Anion gap: 11 (ref 5–15)
BUN: 58 mg/dL — ABNORMAL HIGH (ref 6–20)
CO2: 21 mmol/L — ABNORMAL LOW (ref 22–32)
Calcium: 9.7 mg/dL (ref 8.9–10.3)
Chloride: 106 mmol/L (ref 98–111)
Creatinine, Ser: 6.44 mg/dL — ABNORMAL HIGH (ref 0.44–1.00)
GFR, Estimated: 7 mL/min — ABNORMAL LOW (ref >60)
Glucose, Bld: 84 mg/dL (ref 70–99)
Phosphorus: 5 mg/dL — ABNORMAL HIGH (ref 2.5–4.6)
Potassium: 5 mmol/L (ref 3.5–5.1)
Sodium: 138 mmol/L (ref 135–145)

## 2021-10-24 LAB — MRSA NEXT GEN BY PCR, NASAL: MRSA by PCR Next Gen: NOT DETECTED

## 2021-10-24 MED ORDER — CHLORHEXIDINE GLUCONATE CLOTH 2 % EX PADS
6.0000 | MEDICATED_PAD | Freq: Every day | CUTANEOUS | Status: DC
  Administered 2021-10-25: 6 via TOPICAL

## 2021-10-24 NOTE — Progress Notes (Signed)
Went in to tell patient that will be NPO for tunnel cath and patient said does not want the tunnel cath wants a temporary cath and then will go to [place] and have the rest done there.  Sent a secure chat to Provider and to Neprology NP who had spoken to patient at length this afternoon. ?

## 2021-10-24 NOTE — Progress Notes (Signed)
Plan TDC in AM. ?NPO after midnight. ?Orders for consent written. ? ?Brooke Baldwin. Stanford Breed, MD ?[facility] ?Office Phone Number: [phone] ?10/24/2021 5:37 PM ? ?

## 2021-10-24 NOTE — Progress Notes (Signed)
Pharmacy Antibiotic Note ? ?Brooke Baldwin is a 46 y.o. female admitted on 10/21/2021 with  tunneled dialysis catheter infection .  Pharmacy has been consulted for vancomycin and cefepime dosing. ? ?No labs on 3/21 for line holiday ?Planning for new tunneled dialysis catheter insertion on 3/21 or 3/22 and for next HD on 3/22 or 3/23. ?Last HD on 3/19 ? ?Plan: ?Continue Vancomycin '1000mg'$  IV q HD ?Continue Cefepime 1g IV q 24h ?Monitor HD schedule, cultures and clinical progression to narrow ?Vancomycin random level as needed ? ?Height: '5\' 2"'$  (157.5 cm) ?Weight: 83.5 kg (184 lb 1.4 oz) ?IBW/kg (Calculated) : 50.1 ? ?Temp (24hrs), Avg:98 ?F (36.7 ?C), Min:97.5 ?F (36.4 ?C), Max:98.4 ?F (36.9 ?C) ? ?Recent Labs  ?Lab 10/21/21 ?1823 10/21/21 ?2147 10/21/21 ?2349 10/22/21 ?0150  ?WBC 8.3  --  7.7  --   ?CREATININE 6.62*  --  6.46*  --   ?LATICACIDVEN 1.3 2.4*  --  0.8  ?  ?Estimated Creatinine Clearance: 10.8 mL/min (A) (by C-G formula based on SCr of 6.46 mg/dL (H)).   ? ?Allergies  ?Allergen Reactions  ? Baclofen Other (See Comments)  ?  Caused intolerable dizziness   ? Gabapentin Other (See Comments)  ?  Caused intolerable dizziness  ? Penicillins Other (See Comments)  ?  Unknown childhood reaction ?  ? ? ?Antimicrobials this admission: ?3/18 cefepime >>  ?3/19 vancomycin >>  ? ?Dose adjustments this admission: ?None ? ?Microbiology results: ?3/18 Bcx x2: NGTD x3d ?3/18 Tissue near Tunneled Dialysis Catheter Cx: few Staph aureus - reincubated for better growth  ?3/20 MRSA PCR: negative ? ?Thank you for allowing pharmacy to be a part of this patient?s care. ? ?Brooke Baldwin ?10/24/2021 7:53 AM ?

## 2021-10-24 NOTE — Consult Note (Signed)
? ? ?[facility] for Infectious Diseases  ?                                                                                     ? ?Patient Identification: ?Patient Name: Brooke Baldwin MRN: [id number] [place] Date: 10/21/2021  5:43 PM ?Today's Date: 10/24/2021 ?Reason for consult: HD catheter infection  ?Requesting provider: Darliss Cheney ? ?Principal Problem: ?  Central line insertion site infection ?Active Problems: ?  HIV (human immunodeficiency virus infection) ([place]) ?  End stage renal disease ([place]) ?  At risk for central line-associated bloodstream infection (CLABSI) ? ? ?Antibiotics:  ?Vancomycin 3/18-c ?Cefepime 3/18-c ? ?Lines/Hardware: left forearm AV ? ?Assessment ?HD catheter site infection without bacteremia ( 3/18 blood cx both sets no growth in 3 days ( no prior abtx)- HD catheter removed on 3/19 by VVS ? ?ESRD on HD  - has old AVG in left forearm without any issues ( non working). Nephrology following ? ?HIV/AIDS, well controlled - on Biktarvy,  follow[facility] Juleen [facility] ? ?Immunosuppressed - on atovaquone for PJP ppx ? ?Recommendations  ?Continue vancomycin with HD, DC cefepime ?Fu blood cultures for any growth  ?On line holiday; Needs access for HD, VVS following  ?[place] of rt chest HD catheter site to r/o deeper infection ( still has significant tenderness) ?Will hold off on echocardiogram for now given acute onset and negative blood cultures so far ?Final abtx recs pending US/blood cultures  ? ?Rest of the management as per the primary team. Please call with questions or concerns.  ?Thank you for the consult ? ?Rosiland Oz, MD ?Infectious Disease Physician ?[facility] ?[address] ?[place], [place] [address] ?Phone: [phone]  Fax: [phone] ? ?__________________________________________________________________________________________________________ ?HPI and Hospital  Course: ?46 year old female with PMH of HIV, ESRD on HD secondary to FSGS/HIV/HTN, CIN, obesity , RT ankle # s/p ORIF, left peripheral vascular balloon angioplasty who presented to the ED on 3/18 due to loose HD catheter that moves with movement.  Her HD catheter started bothering her from last Thursday including some tenderness.  The tenderness got worse on Friday with more swelling that tracked upwards in her lower neck area and rt shoulder. She could not even lift her rt shoulder properly.  ?Denies fever, chills or sweats.  Denies nausea, vomiting or diarrhea. Denies chest pain, cough and SOB. Denies any back pain, peripheral joint pain and swelling. She has an AVG at the left forearm which she says does not work ? ?At ED, afebrile, WBC 8.3 ?K5.3 ?3/18 blood cultures no growth in 3 days ?Chest xray with no acute abnormality  ? ?ROS: ?General- Denies fever, chills, loss of appetite and loss of weight ?HEENT - Denies headache, blurry vision, neck pain, sinus pain ?Chest - Denies any chest pain, SOB or cough ?CVS- Denies any dizziness/lightheadedness, syncopal attacks, palpitations ?Abdomen- Denies any nausea, vomiting, abdominal pain, hematochezia and diarrhea ?Neuro - Denies any weakness, numbness, tingling sensation ?Psych - Denies any changes in mood irritability or depressive symptoms ?GU- Denies any burning, dysuria, hematuria or increased frequency of urination ?Skin - denies any rashes/lesions ?MSK -  denies any joint pain/swelling or restricted ROM  ? ?Past Medical History:  ?Diagnosis Date  ? Anemia associated with chronic renal failure   ? 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 CIN I (cervical intraepithelial neoplasia I)   ? ESRD (end stage renal disease) on dialysis [facility]) 05/2020  ? hemodialysis  T/ Th/ Sat  @ [facility] in [place], ( 10-17-2020  s/p left AVG 11/ 2021 unable to use as of yet, currently using right IJ tunneled cath (right side of upper chest) for dialysis  ? FSGS (focal segmental  glomerulosclerosis)   ? renal biopsy 04-15-2020 in epic , secondary to HTN/ HIV/ Obese  ? Heart palpitations 11/ 2021  (10-17-2020  pt stated the palpitations does not bother her and has no other symptoms , stated they started 11/ 2021)  ? pt was been seen by cardiology, dr s. Patria Mane ([facility] in [place]),  per pt had event monitor for 17 days , was told normal with  no arrhythmias,  and stated echo is scheduled for 10-28-2020  ? History of vulvar dysplasia   ? HIV (human immunodeficiency virus infection) ([place])   ? followed by ID-- dr a. wallace  ? Hypertension   ? S/P arteriovenous (AV) fistula creation 07-04-2020 [facility]   ? left forearm,  07-28-2020  angioplasty of thrombosis  ? Secondary hyperparathyroidism of renal origin [facility])   ? Tachycardia   ? Thrombocytopenia ([place]) 07/01/2020  ? Trigeminal neuralgia   ? ride side ---  neurologist--- dr Krista Blue   (10-17-2020  pt stated last episode about 4 months ago was mild)  ? VIN II (vulvar intraepithelial neoplasia II)   ? ?Past Surgical History:  ?Procedure Laterality Date  ? A/V FISTULAGRAM Left 07/28/2020  ? Procedure: A/V FISTULAGRAM;  Surgeon: Marty Heck, MD;  Location: [facility] [facility];  Service: Cardiovascular;  Laterality: Left;  ? AV FISTULA PLACEMENT Left 07/04/2020  ? Procedure: INSERTION OF LEFT ARTERIOVENOUS (AV) GORE-TEX GRAFT ARM (ACUSEAL);  Surgeon: Cherre Robins, MD;  Location: [place];  Service: Vascular;  Laterality: Left;  ? CERVICAL BIOPSY  W/ LOOP ELECTRODE EXCISION  2005  ? CO2 LASER APPLICATION N/A 04/09/91  ? Procedure: CO2 LASER APPLICATION;  Surgeon: Everitt Amber, MD;  Location: [facility];  Service: Gynecology;  Laterality: N/A;  ? INSERTION OF DIALYSIS CATHETER N/A 08/14/2021  ? Procedure: TUNNELED DIALYSIS CATHETER EXCHANGE;  Surgeon: Angelia Mould, MD;  Location: [facility];  Service: Vascular;  Laterality: N/A;  ? ORIF ANKLE FRACTURE Right 2012  ? per pt retained hardware  ? PERIPHERAL VASCULAR BALLOON  ANGIOPLASTY Left 07/28/2020  ? Procedure: PERIPHERAL VASCULAR BALLOON ANGIOPLASTY;  Surgeon: Marty Heck, MD;  Location: [facility];  Service: Cardiovascular;  Laterality: Left;  AVF  ? VULVA SURGERY  2013  ? excision  ? ? ? ?Scheduled Meds: ? atovaquone  1,500 mg Oral Daily  ? bictegravir-emtricitabine-tenofovir AF  1 tablet Oral QHS  ? Chlorhexidine Gluconate Cloth  6 each Topical Daily  ? heparin  5,000 Units Subcutaneous Q8H  ? multivitamin  1 tablet Oral q morning  ? ?Continuous Infusions: ? ceFEPime (MAXIPIME) IV 1 g (10/23/21 2326)  ? [START ON 10/25/2021] vancomycin    ? ?PRN Meds:.acetaminophen **OR** acetaminophen, ondansetron **OR** ondansetron (ZOFRAN) IV ? ?Allergies  ?Allergen Reactions  ? Baclofen Other (See Comments)  ?  Caused intolerable dizziness   ? Gabapentin Other (See Comments)  ?  Caused intolerable dizziness  ? Penicillins  Other (See Comments)  ?  Unknown childhood reaction ?  ? ?Social History  ? ?Socioeconomic History  ? Marital status: Single  ?  Spouse name: Not on file  ? Number of children: 0  ? Years of education: college  ? Highest education level: Bachelor's degree (e.g., BA, AB, BS)  ?Occupational History  ? Occupation: Administrator, arts  ?Tobacco Use  ? Smoking status: Never  ? Smokeless tobacco: Never  ?Vaping Use  ? Vaping Use: Never used  ?Substance and Sexual Activity  ? Alcohol use: Not Currently  ? Drug use: Not Currently  ? Sexual activity: Not Currently  ?  Birth control/protection: Post-menopausal  ?Other Topics Concern  ? Not on file  ?Social History Narrative  ? Lives at home with significant other.  ? Right-handed.  ? No daily caffeine use.  ? ?Social Determinants of Health  ? ?Financial Resource Strain: Not on file  ?Food Insecurity: Not on file  ?Transportation Needs: Not on file  ?Physical Activity: Not on file  ?Stress: Not on file  ?Social Connections: Not on file  ?Intimate Partner Violence: Not on file  ? ?Breast Cancer-relatedfamily history includes  Breast cancer in her maternal aunt. ? ? ?Vitals ?BP 122/81 (BP Location: Right Arm)   Pulse 76   Temp 98.3 ?F (36.8 ?C)   Resp 17   Ht '5\' 2"'$  (1.575 m)   Wt 83.5 kg   LMP 04/02/2015 (Approximate)   SpO2 100%   BMI 33

## 2021-10-24 NOTE — Progress Notes (Signed)
?PROGRESS NOTE ? ? ? ?Brooke Baldwin  KDX:[id number] DOB: [birth year] DOA: 10/21/2021 ?PCP: Pcp, No ? ? ?Brief Narrative:  ?Brooke Baldwin is a 46 y.o. female with medical history significant of HIV, ESRD due to FSGS / HIVAN on HD for ~1 yr. HIV = CD4 of 153 last month, undetectable VL on ART. ?  ?Pt presents to ED with c/o lose dialysis catheter that can slide in and out when she moves.  Over last several days has developed increasing erythema and tenderness along catheter site tracking up on upper R chest toward shoulder and neck.  No fever or chills or nausea vomiting.  Admitted with line infection.  Currently on line holiday.  Nephrology, vascular surgery and ID on board. ? ?Assessment & Plan: ?  ?Principal Problem: ?  Central line insertion site infection ?Active Problems: ?  At risk for central line-associated bloodstream infection (CLABSI) ?  End stage renal disease ([place]) ?  HIV (human immunodeficiency virus infection) ([place]) ?  Immunosuppressed status ([place]) ? ?Central line insertion site infection: HD line was removed by vascular surgery on 10/22/2021 after she received her hemodialysis.  Currently on line holiday.  Blood cultures negative but line tip is growing Staph aureus.  ID on board.  Antibiotics transition to vancomycin.  Vascular also consulted for [facility] however patient this morning mentioned to several of the physicians that she prefers to be discharged and have her care as outpatient in [place] and she is from there.  She has been informed that this is not recommended and that it is highly recommended that she stays further line, hemodialysis and treatment until she is cleared.  She is not happy but she understands that.  Although she is calm but she remains at risk of leaving [place].  Right upper chest ultrasound ordered by ID. ? ?ESRD on HD: Nephrology on board. ? ?HIV: Continue medications. ? ?Lactic acidosis: Resolved. ? ?DVT prophylaxis: heparin injection 5,000 Units  Start: 10/21/21 2200 ?  Code Status: Full Code  ?Family Communication:  None present at bedside.  Plan of care discussed with patient in length and he/she verbalized understanding and agreed with it. ? ?Status is: Inpatient ?Remains inpatient appropriate because: Needs treatment/IV antibiotics for possible CLABSI. ? ? ? ? ?Estimated body mass index is 33.67 kg/m? as calculated from the following: ?  Height as of this encounter: '5\' 2"'$  (1.575 m). ?  Weight as of this encounter: 83.5 kg. ? ?  ?Nutritional Assessment: ?Body mass index is 33.67 kg/m?Marland KitchenMarland Kitchen ?Seen by dietician.  I agree with the assessment and plan as outlined below: ?Nutrition Status: ? ?. ?Skin Assessment: ?I have examined the patient's skin and I agree with the wound assessment as performed by the wound care RN as outlined below: ?  ? ?Consultants:  ?Nephrology ?Vascular surgery ?ID ?Procedures:  ?None ? ?Antimicrobials:  ?Anti-infectives (From admission, onward)  ? ? Start     Dose/Rate Route Frequency Ordered Stop  ? 10/25/21 1200  vancomycin (VANCOCIN) IVPB 1000 mg/200 mL premix       ? 1,000 mg ?200 mL/hr over 60 Minutes Intravenous Every M-W-F (Hemodialysis) 10/22/21 1545    ? 10/24/21 1200  vancomycin (VANCOCIN) IVPB 1000 mg/200 mL premix  Status:  Discontinued       ? 1,000 mg ?200 mL/hr over 60 Minutes Intravenous Every T-Th-Sa (Hemodialysis) 10/21/21 2054 10/22/21 1545  ? 10/22/21 2200  ceFEPIme (MAXIPIME) 1 g in sodium chloride 0.9 % 100 mL IVPB  Status:  Discontinued       ? 1 g ?200 mL/hr over 30 Minutes Intravenous Every 24 hours 10/21/21 2104 10/24/21 1201  ? 10/22/21 1700  vancomycin (VANCOCIN) IVPB 1000 mg/200 mL premix       ? 1,000 mg ?200 mL/hr over 60 Minutes Intravenous  Once 10/22/21 1546 10/22/21 2352  ? 10/22/21 1000  atovaquone (MEPRON) 750 MG/5ML suspension 1,500 mg       ? 1,500 mg Oral Daily 10/21/21 2109    ? 10/21/21 2200  bictegravir-emtricitabine-tenofovir AF (BIKTARVY) 50-200-25 MG per tablet 1 tablet       ? 1 tablet Oral  Daily at bedtime 10/21/21 2108    ? 10/21/21 2115  ceFEPIme (MAXIPIME) 2 g in sodium chloride 0.9 % 100 mL IVPB       ? 2 g ?200 mL/hr over 30 Minutes Intravenous  Once 10/21/21 2104 10/22/21 0009  ? 10/21/21 2100  piperacillin-tazobactam (ZOSYN) IVPB 3.375 g  Status:  Discontinued       ? 3.375 g ?100 mL/hr over 30 Minutes Intravenous  Once 10/21/21 2047 10/21/21 2059  ? 10/21/21 2100  vancomycin (VANCOREADY) IVPB 2000 mg/400 mL       ? 2,000 mg ?200 mL/hr over 120 Minutes Intravenous  Once 10/21/21 2054 10/22/21 0236  ? ?  ?  ? ? ?Subjective: ? ?Patient seen and examined.  No complaints.  But she is still tender at the right upper chest. ? ?Objective: ?Vitals:  ? 10/23/21 1739 10/23/21 2314 10/24/21 6384 10/24/21 0931  ?BP: 115/76 112/82 109/70 122/81  ?Pulse: 93 80 75 76  ?Resp: '17 17 18 17  '$ ?Temp: 98.2 ?F (36.8 ?C) 97.7 ?F (36.5 ?C) (!) 97.5 ?F (36.4 ?C) 98.3 ?F (36.8 ?C)  ?TempSrc:  Oral Oral   ?SpO2: 100% 100% 99% 100%  ?Weight:      ?Height:      ? ? ?Intake/Output Summary (Last 24 hours) at 10/24/2021 1236 ?Last data filed at 10/24/2021 0800 ?Gross per 24 hour  ?Intake 1000 ml  ?Output 0 ml  ?Net 1000 ml  ? ? ?Filed Weights  ? 10/22/21 1104 10/22/21 1427  ?Weight: 85.5 kg 83.5 kg  ? ? ?Examination: ? ?General exam: Appears calm and comfortable  ?Respiratory system: Clear to auscultation. Respiratory effort normal.  Right upper chest tenderness. ?Cardiovascular system: S1 & S2 heard, RRR. No JVD, murmurs, rubs, gallops or clicks. No pedal edema. ?Gastrointestinal system: Abdomen is nondistended, soft and nontender. No organomegaly or masses felt. Normal bowel sounds heard. ?Central nervous system: Alert and oriented. No focal neurological deficits. ?Extremities: Symmetric 5 x 5 power. ?Skin: No rashes, lesions or ulcers.  ?Psychiatry: Judgement and insight appear normal. Mood & affect appropriate.  ? ?Data Reviewed: I have personally reviewed following labs and imaging studies ? ?CBC: ?Recent Labs  ?Lab  10/21/21 ?1823 10/21/21 ?2349  ?WBC 8.3 7.7  ?NEUTROABS 5.4  --   ?HGB 12.5 11.6*  ?HCT 38.9 35.4*  ?MCV 106.0* 104.1*  ?PLT 195 168  ? ? ?Basic Metabolic Panel: ?Recent Labs  ?Lab 10/21/21 ?1823 10/21/21 ?2349  ?NA 135 135  ?K 5.3* 4.4  ?CL 104 109  ?CO2 19* 16*  ?GLUCOSE 80 177*  ?BUN 55* 57*  ?CREATININE 6.62* 6.46*  ?CALCIUM 9.6 9.1  ? ? ?GFR: ?Estimated Creatinine Clearance: 10.8 mL/min (A) (by C-G formula based on SCr of 6.46 mg/dL (H)). ?Liver Function Tests: ?Recent Labs  ?Lab 10/21/21 ?1823  ?AST 40  ?ALT 46*  ?ALKPHOS 136*  ?BILITOT  0.4  ?PROT 7.8  ?ALBUMIN 3.5  ? ? ?Recent Labs  ?Lab 10/21/21 ?1823  ?LIPASE 107*  ? ? ?No results for input(s): AMMONIA in the last 168 hours. ?Coagulation Profile: ?No results for input(s): INR, PROTIME in the last 168 hours. ?Cardiac Enzymes: ?No results for input(s): CKTOTAL, CKMB, CKMBINDEX, TROPONINI in the last 168 hours. ?BNP (last 3 results) ?No results for input(s): PROBNP in the last 8760 hours. ?HbA1C: ?No results for input(s): HGBA1C in the last 72 hours. ?CBG: ?No results for input(s): GLUCAP in the last 168 hours. ?Lipid Profile: ?No results for input(s): CHOL, HDL, LDLCALC, TRIG, CHOLHDL, LDLDIRECT in the last 72 hours. ?Thyroid Function Tests: ?No results for input(s): TSH, T4TOTAL, FREET4, T3FREE, THYROIDAB in the last 72 hours. ?Anemia Panel: ?No results for input(s): VITAMINB12, FOLATE, FERRITIN, TIBC, IRON, RETICCTPCT in the last 72 hours. ?Sepsis Labs: ?Recent Labs  ?Lab 10/21/21 ?1823 10/21/21 ?2147 10/22/21 ?0150  ?LATICACIDVEN 1.3 2.4* 0.8  ? ? ? ?Recent Results (from the past 240 hour(s))  ?Aerobic/Anaerobic Culture w Gram Stain (surgical/deep wound)     Status: None (Preliminary result)  ? Collection Time: 10/21/21  5:43 PM  ? Specimen: Chest; Other  ?Result Value Ref Range Status  ? Specimen Description CHEST  Final  ? Special Requests Immunocompromised  Final  ? Gram Stain   Final  ?  ABUNDANT WBC PRESENT, PREDOMINANTLY PMN ?RARE GRAM POSITIVE  COCCI ?  ? Culture   Final  ?  FEW STAPHYLOCOCCUS AUREUS ?SUSCEPTIBILITIES TO FOLLOW ?Performed at [facility], [place] [address] [place], [place] [address] ?  ? Report Status PENDING  Incomplete  ?Blood cult

## 2021-10-24 NOTE — Progress Notes (Signed)
?[facility] ?Progress Note  ? ?Subjective: Wants to go home and have TDC inserted as OP. Lengthy discussion with patient regarding positive BC, need for ABX and need to remain in hospital for [facility] insertion. She is not happy but verbalizes understanding.   ? ?Objective ?Vitals:  ? 10/23/21 1739 10/23/21 2314 10/24/21 0350 10/24/21 0931  ?BP: 115/76 112/82 109/70 122/81  ?Pulse: 93 80 75 76  ?Resp: '17 17 18 17  '$ ?Temp: 98.2 ?F (36.8 ?C) 97.7 ?F (36.5 ?C) (!) 97.5 ?F (36.4 ?C) 98.3 ?F (36.8 ?C)  ?TempSrc:  Oral Oral   ?SpO2: 100% 100% 99% 100%  ?Weight:      ?Height:      ? ?Physical Exam ?General: Pleasant, WN, WD NAD ?Heart: S1,S2 RRR No M/R/G ?Lungs: CTAB A/P ?Abdomen: Soft, NABS ?Extremities: No LE edema ?Dialysis Access: None at present. Still mild swelling, tenderness RIJ area.  ?  ? ? ?Additional Objective ?Labs: ?Basic Metabolic Panel: ?Recent Labs  ?Lab 10/21/21 ?1823 10/21/21 ?2349  ?NA 135 135  ?K 5.3* 4.4  ?CL 104 109  ?CO2 19* 16*  ?GLUCOSE 80 177*  ?BUN 55* 57*  ?CREATININE 6.62* 6.46*  ?CALCIUM 9.6 9.1  ? ?Liver Function Tests: ?Recent Labs  ?Lab 10/21/21 ?1823  ?AST 40  ?ALT 46*  ?ALKPHOS 136*  ?BILITOT 0.4  ?PROT 7.8  ?ALBUMIN 3.5  ? ?Recent Labs  ?Lab 10/21/21 ?1823  ?LIPASE 107*  ? ?CBC: ?Recent Labs  ?Lab 10/21/21 ?1823 10/21/21 ?2349  ?WBC 8.3 7.7  ?NEUTROABS 5.4  --   ?HGB 12.5 11.6*  ?HCT 38.9 35.4*  ?MCV 106.0* 104.1*  ?PLT 195 168  ? ?Blood Culture ?   ?Component Value Date/Time  ? SDES BLOOD LEFT HAND 10/21/2021 2349  ? SPECREQUEST  10/21/2021 2349  ?  BOTTLES DRAWN AEROBIC AND ANAEROBIC Blood Culture results may not be optimal due to an inadequate volume of blood received in culture bottles  ? CULT  10/21/2021 2349  ?  NO GROWTH 2 DAYS ?Performed at [facility], [place] [address] [place], [place] [address] ?  ? REPTSTATUS PENDING 10/21/2021 2349  ? ? ?Cardiac Enzymes: ?No results for input(s): CKTOTAL, CKMB, CKMBINDEX, TROPONINI in the last 168 hours. ?CBG: ?No results  for input(s): GLUCAP in the last 168 hours. ?Iron Studies: No results for input(s): IRON, TIBC, TRANSFERRIN, FERRITIN in the last 72 hours. ?'@lablastinr3'$ @ ?Studies/Results: ?No results found. ?Medications: ? ceFEPime (MAXIPIME) IV 1 g (10/23/21 2326)  ? [START ON 10/25/2021] vancomycin    ? ? atovaquone  1,500 mg Oral Daily  ? bictegravir-emtricitabine-tenofovir AF  1 tablet Oral QHS  ? Chlorhexidine Gluconate Cloth  6 each Topical Daily  ? heparin  5,000 Units Subcutaneous Q8H  ? multivitamin  1 tablet Oral q morning  ? ? ? ?HD orders: [facility] MWF ?4:15 hrs 300/Autoflow 1.5 2.0 K/2.0 Ca 82 kg  ?-Heparin 3000 units IV TIW ?-Calcitriol 2 mcg PO TIW ?  ?Assessment/Plan: ?TDC infection-BC X 2 obtained 3/18, Catheter tip + Staph. BC NGTD 2 days. On IV vanc/Cefepime/ VVS consulted.  TDC removed 10/22/2021 for line holiday. Plan for HD Wed or Thursday this week after TDC replaced.  ?ESRD -  on HD MWF. Please see above. Had planned on doing PD but not able to place PD catheter D/T uterine fibroids. Refusing permanent access at this time. Repeat RFP today.  ?Hypertension/volume  - euvolemic on exam, Bps stable. Not reaching OP EDW. May need to increase EDW.  ?Anemia  of CKD - Hgb 11.6, no indication for ESA/Fe at this time ?Secondary Hyperparathyroidism -  Ca okay, will check PO4 in AM. Continue binders ?Nutrition - Renal with fluid restriction for now. NPO depending on when [facility] removal will occur ?HIV-Meds per primary ? ?Fynley Chrystal H. Ishan Sanroman NP-C ?10/24/2021, 11:45 AM  ?[facility] ?[phone] ? ? ?  ? ?

## 2021-10-24 NOTE — Progress Notes (Addendum)
?  Progress Note ? ? ? ?10/24/2021 ?7:26 AM ?* No surgery found * ? ?Subjective:  no complaints.  Denies fevers, chills, N/V ? ? ?Vitals:  ? 10/23/21 2314 10/24/21 0508  ?BP: 112/82 109/70  ?Pulse: 80 75  ?Resp: 17 18  ?Temp: 97.7 ?F (36.5 ?C) (!) 97.5 ?F (36.4 ?C)  ?SpO2: 100% 99%  ? ?Physical Exam: ?Lungs:  non labored ?Neurologic: a&O ? ?CBC ?   ?Component Value Date/Time  ? WBC 7.7 10/21/2021 2349  ? RBC 3.40 (L) 10/21/2021 2349  ? HGB 11.6 (L) 10/21/2021 2349  ? HGB 14.1 12/09/2019 0954  ? HCT 35.4 (L) 10/21/2021 2349  ? HCT 41.3 12/09/2019 0954  ? PLT 168 10/21/2021 2349  ? MCV 104.1 (H) 10/21/2021 2349  ? MCV 86 12/09/2019 0954  ? MCH 34.1 (H) 10/21/2021 2349  ? MCHC 32.8 10/21/2021 2349  ? RDW 12.6 10/21/2021 2349  ? RDW 13.7 12/09/2019 0954  ? LYMPHSABS 1.2 10/21/2021 1823  ? LYMPHSABS 1.1 12/09/2019 0954  ? MONOABS 1.0 10/21/2021 1823  ? EOSABS 0.8 (H) 10/21/2021 1823  ? EOSABS 0.2 12/09/2019 0954  ? BASOSABS 0.0 10/21/2021 1823  ? BASOSABS 0.0 12/09/2019 0954  ? ? ?BMET ?   ?Component Value Date/Time  ? NA 135 10/21/2021 2349  ? NA 139 12/09/2019 0954  ? K 4.4 10/21/2021 2349  ? CL 109 10/21/2021 2349  ? CO2 16 (L) 10/21/2021 2349  ? GLUCOSE 177 (H) 10/21/2021 2349  ? BUN 57 (H) 10/21/2021 2349  ? BUN 21 12/15/2019 0848  ? CREATININE 6.46 (H) 10/21/2021 2349  ? CREATININE 6.10 (H) 09/27/2021 0930  ? CALCIUM 9.1 10/21/2021 2349  ? CALCIUM 7.0 (L) 06/30/2020 1459  ? GFRNONAA 7 (L) 10/21/2021 2349  ? GFRNONAA 12 (L) 08/19/2020 0000  ? GFRAA 14 (L) 08/19/2020 0000  ? ? ?INR ?   ?Component Value Date/Time  ? INR 0.9 04/13/2020 0620  ? ? ? ?Intake/Output Summary (Last 24 hours) at 10/24/2021 0726 ?Last data filed at 10/24/2021 0600 ?Gross per 24 hour  ?Intake 1000 ml  ?Output 0 ml  ?Net 1000 ml  ? ? ? ?Assessment/Plan:  46 y.o. female is s/p removal of infected TDC  ? ?Patient prefers to be discharged today and have [place] placed at [facility] as an outpatient.  I encouraged her to discuss this with her Nephrologist  this morning.  Vascular will standby if patient remains inpatient and needs [place] placed tomorrow or Thursday. ? ? ? ?Dagoberto Ligas, PA-C ?[facility] ?[phone] ?10/24/2021 ?7:26 AM ? ?VASCULAR STAFF ADDENDUM: ?I have independently interviewed and examined the patient. ?I agree with the above.  ?S. aureus on catheter tip. Will likely need vancomycin at HD. Will defer to nephrology. ?Available to help with [facility] if she changes her mind. ?Please call for questions. ? ?[name]. Stanford Breed, MD ?[facility] [place] ?Office Phone Number: [phone] ?10/24/2021 10:33 AM ? ? ? ?

## 2021-10-24 NOTE — Plan of Care (Signed)
  Problem: Clinical Measurements: Goal: Respiratory complications will improve Outcome: Progressing   Problem: Clinical Measurements: Goal: Cardiovascular complication will be avoided Outcome: Progressing   Problem: Activity: Goal: Risk for activity intolerance will decrease Outcome: Progressing   Problem: Nutrition: Goal: Adequate nutrition will be maintained Outcome: Progressing   

## 2021-10-25 ENCOUNTER — Inpatient Hospital Stay (HOSPITAL_COMMUNITY): Payer: POS

## 2021-10-25 ENCOUNTER — Encounter (HOSPITAL_COMMUNITY): Payer: Self-pay | Admitting: Certified Registered"

## 2021-10-25 ENCOUNTER — Encounter (HOSPITAL_COMMUNITY): Payer: POS

## 2021-10-25 ENCOUNTER — Other Ambulatory Visit (HOSPITAL_COMMUNITY): Payer: Self-pay

## 2021-10-25 ENCOUNTER — Encounter (HOSPITAL_COMMUNITY)
Admission: EM | Disposition: A | Discharge status: Home / Self Care | Payer: Self-pay | Source: Home / Self Care | Attending: Family Medicine

## 2021-10-25 DIAGNOSIS — T80212A Local infection due to central venous catheter, initial encounter: Secondary | ICD-10-CM

## 2021-10-25 DIAGNOSIS — T827XXA Infection and inflammatory reaction due to other cardiac and vascular devices, implants and grafts, initial encounter: Secondary | ICD-10-CM

## 2021-10-25 DIAGNOSIS — T80212D Local infection due to central venous catheter, subsequent encounter: Secondary | ICD-10-CM

## 2021-10-25 LAB — RENAL FUNCTION PANEL
Albumin: 2.9 g/dL — ABNORMAL LOW (ref 3.5–5.0)
Anion gap: 11 (ref 5–15)
BUN: 70 mg/dL — ABNORMAL HIGH (ref 6–20)
CO2: 18 mmol/L — ABNORMAL LOW (ref 22–32)
Calcium: 9.6 mg/dL (ref 8.9–10.3)
Chloride: 107 mmol/L (ref 98–111)
Creatinine, Ser: 6.59 mg/dL — ABNORMAL HIGH (ref 0.44–1.00)
GFR, Estimated: 7 mL/min — ABNORMAL LOW (ref >60)
Glucose, Bld: 87 mg/dL (ref 70–99)
Phosphorus: 4.9 mg/dL — ABNORMAL HIGH (ref 2.5–4.6)
Potassium: 4.7 mmol/L (ref 3.5–5.1)
Sodium: 136 mmol/L (ref 135–145)

## 2021-10-25 LAB — SURGICAL PCR SCREEN
MRSA, PCR: NEGATIVE
Staphylococcus aureus: NEGATIVE

## 2021-10-25 LAB — CBC
HCT: 35.8 % — ABNORMAL LOW (ref 36.0–46.0)
Hemoglobin: 11.6 g/dL — ABNORMAL LOW (ref 12.0–15.0)
MCH: 33.4 pg (ref 26.0–34.0)
MCHC: 32.4 g/dL (ref 30.0–36.0)
MCV: 103.2 fL — ABNORMAL HIGH (ref 80.0–100.0)
Platelets: 208 10*3/uL (ref 150–400)
RBC: 3.47 MIL/uL — ABNORMAL LOW (ref 3.87–5.11)
RDW: 12.3 % (ref 11.5–15.5)
WBC: 5.3 10*3/uL (ref 4.0–10.5)
nRBC: 0 % (ref 0.0–0.2)

## 2021-10-25 SURGERY — INSERTION OF DIALYSIS CATHETER
Anesthesia: Choice

## 2021-10-25 MED ORDER — LINEZOLID 600 MG PO TABS
600.0000 mg | ORAL_TABLET | Freq: Two times a day (BID) | ORAL | 0 refills | Status: AC
  Filled 2021-10-25: qty 56, 28d supply, fill #0

## 2021-10-25 MED ORDER — CEFAZOLIN SODIUM-DEXTROSE 1-4 GM/50ML-% IV SOLN
1.0000 g | INTRAVENOUS | Status: DC
  Filled 2021-10-25: qty 50

## 2021-10-25 NOTE — Progress Notes (Signed)
Error

## 2021-10-25 NOTE — Discharge Summary (Signed)
? ? ? ? ?Physician Discharge Summary  ?Brooke Baldwin VZD:[id number] DOB: Apr 24, 1976 DOA: 10/21/2021 ? ?PCP: Pcp, No ? ?Admit date: 10/21/2021 ?Discharge date: 10/25/2021 ? ?Admitted From: home ?Discharge disposition: home ? ? ?Recommendations for Outpatient Follow-Up:  ? ?4 weeks of zyvox ?Patient refusing to have access placed at [facility]-- will follow up in [place]- has appointment tomorrow PM ? ? ?Discharge Diagnosis:  ? ?Principal Problem: ?  Central line insertion site infection ?Active Problems: ?  At risk for central line-associated bloodstream infection (CLABSI) ?  End stage renal disease ([place]) ?  HIV (human immunodeficiency virus infection) ([place]) ?  Immunosuppressed status ([place]) ? ? ? ?Discharge Condition: Improved. ? ?Diet recommendation: renal ? ?Wound care: None. ? ?Code status: Full. ? ? ?History of Present Illness:  ? ?Brooke Baldwin is a 46 y.o. 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 ?  ?ESRD on HD for ~1 yr. ?  ?HIV = CD4 of 153 last month, undetectable VL on ART. ?  ?Pt undergoing dialysis via tunneled IJ replaced in Jan. ?  ?Pt presents to ED with c/o lose dialysis catheter that can slide in and out when she moves.  Over last several days has developed increasing erythema and tenderness along catheter site tracking up on upper R chest toward shoulder and neck. ?  ?No fevers, chills, N/V or other systemic symptoms, no drainage. ?  ?Went to UC who sent her into ED. ? ? ?Hospital Course by Problem:  ? ?Central line insertion site infection: HD line was removed by vascular surgery on 10/22/2021 after she received her hemodialysis.   ?-Blood cultures negative but line tip is growing Staph aureus.  ID on board- plan for 4 weeks of zyvox ?-Vascular also consulted for [facility] however patient prefers to be discharged and have her care as outpatient in [place] and she is from there.  She has been informed that this is not recommended and that it is highly recommended  that she stays further line, hemodialysis and treatment until she is cleared.  Patient to be d/c'd to follow up in [place] in the AM for her previously scheduled appointment.   ? ?ESRD on HD: Nephrology on board. ?  ?HIV: Continue medications. ?  ?Lactic acidosis: Resolved. ? ? ? ?Medical Consultants:  ? ?Renal ?vascular ? ? ?Discharge Exam:  ? ?Vitals:  ? 10/25/21 0600 10/25/21 0811  ?BP: 110/85 134/83  ?Pulse:  69  ?Resp: 18 18  ?Temp: 98.3 ?F (36.8 ?C) 98 ?F (36.7 ?C)  ?SpO2:  100%  ? ?Vitals:  ? 10/24/21 1759 10/24/21 2033 10/25/21 0600 10/25/21 0811  ?BP: 124/89 116/89 110/85 134/83  ?Pulse: 80 80  69  ?Resp: '18 18 18 18  '$ ?Temp: 98.2 ?F (36.8 ?C) 98 ?F (36.7 ?C) 98.3 ?F (36.8 ?C) 98 ?F (36.7 ?C)  ?TempSrc:  Oral Oral   ?SpO2: 100% 100%  100%  ?Weight:      ?Height:      ? ? ?General exam: Appears calm and comfortable.  ? ? ?The results of significant diagnostics from this hospitalization (including imaging, microbiology, ancillary and laboratory) are listed below for reference.   ? ? ?Procedures and Diagnostic Studies:  ? ?DG Chest 2 View ? ?Result Date: 10/21/2021 ?CLINICAL DATA:  Pain around tunneled dialysis catheter EXAM: CHEST - 2 VIEW COMPARISON:  08/14/2021 FINDINGS: The heart size and mediastinal contours are within normal limits. Large-bore right neck multi lumen vascular catheter, tip  appears somewhat retracted compared to prior examination, now over the mid SVC. Both lungs are clear. The visualized skeletal structures are unremarkable. IMPRESSION: 1. No acute abnormality of the lungs. 2. Large-bore right neck multi lumen vascular catheter, tip appears somewhat retracted compared to prior examination, now over the mid SVC. Electronically Signed   By: Delanna Ahmadi M.D.   On: 10/21/2021 18:49  ? ? ? ?Labs:  ? ?Basic Metabolic Panel: ?Recent Labs  ?Lab 10/21/21 ?1823 10/21/21 ?2349 10/24/21 ?1200 10/25/21 ?0932  ?NA 135 135 138 136  ?K 5.3* 4.4 5.0 4.7  ?CL 104 109 106 107  ?CO2 19* 16* 21* 18*  ?GLUCOSE 80  177* 84 87  ?BUN 55* 57* 58* 70*  ?CREATININE 6.62* 6.46* 6.44* 6.59*  ?CALCIUM 9.6 9.1 9.7 9.6  ?PHOS  --   --  5.0* 4.9*  ? ?GFR ?Estimated Creatinine Clearance: 10.6 mL/min (A) (by C-G formula based on SCr of 6.59 mg/dL (H)). ?Liver Function Tests: ?Recent Labs  ?Lab 10/21/21 ?1823 10/24/21 ?1200 10/25/21 ?3557  ?AST 40  --   --   ?ALT 46*  --   --   ?ALKPHOS 136*  --   --   ?BILITOT 0.4  --   --   ?PROT 7.8  --   --   ?ALBUMIN 3.5 3.2* 2.9*  ? ?Recent Labs  ?Lab 10/21/21 ?1823  ?LIPASE 107*  ? ?No results for input(s): AMMONIA in the last 168 hours. ?Coagulation profile ?No results for input(s): INR, PROTIME in the last 168 hours. ? ?CBC: ?Recent Labs  ?Lab 10/21/21 ?1823 10/21/21 ?2349 10/25/21 ?3220  ?WBC 8.3 7.7 5.3  ?NEUTROABS 5.4  --   --   ?HGB 12.5 11.6* 11.6*  ?HCT 38.9 35.4* 35.8*  ?MCV 106.0* 104.1* 103.2*  ?PLT 195 168 208  ? ?Cardiac Enzymes: ?No results for input(s): CKTOTAL, CKMB, CKMBINDEX, TROPONINI in the last 168 hours. ?BNP: ?Invalid input(s): POCBNP ?CBG: ?No results for input(s): GLUCAP in the last 168 hours. ?D-Dimer ?No results for input(s): DDIMER in the last 72 hours. ?Hgb A1c ?No results for input(s): HGBA1C in the last 72 hours. ?Lipid Profile ?No results for input(s): CHOL, HDL, LDLCALC, TRIG, CHOLHDL, LDLDIRECT in the last 72 hours. ?Thyroid function studies ?No results for input(s): TSH, T4TOTAL, T3FREE, THYROIDAB in the last 72 hours. ? ?Invalid input(s): FREET3 ?Anemia work up ?No results for input(s): VITAMINB12, FOLATE, FERRITIN, TIBC, IRON, RETICCTPCT in the last 72 hours. ?Microbiology ?Recent Results (from the past 240 hour(s))  ?Aerobic/Anaerobic Culture w Gram Stain (surgical/deep wound)     Status: None (Preliminary result)  ? Collection Time: 10/21/21  5:43 PM  ? Specimen: Chest; Other  ?Result Value Ref Range Status  ? Specimen Description CHEST  Final  ? Special Requests Immunocompromised  Final  ? Gram Stain   Final  ?  ABUNDANT WBC PRESENT, PREDOMINANTLY PMN ?RARE  GRAM POSITIVE COCCI ?Performed at [facility], [place] [address] [place], [place] [address] ?  ? Culture   Final  ?  FEW STAPHYLOCOCCUS AUREUS ?NO ANAEROBES ISOLATED; CULTURE IN PROGRESS FOR 5 DAYS ?  ? Report Status PENDING  Incomplete  ? Organism ID, Bacteria STAPHYLOCOCCUS AUREUS  Final  ?    Susceptibility  ? Staphylococcus aureus - MIC*  ?  CIPROFLOXACIN <=0.5 SENSITIVE Sensitive   ?  ERYTHROMYCIN <=0.25 SENSITIVE Sensitive   ?  GENTAMICIN <=0.5 SENSITIVE Sensitive   ?  OXACILLIN <=0.25 SENSITIVE Sensitive   ?  TETRACYCLINE <=1 SENSITIVE Sensitive   ?  VANCOMYCIN 1 SENSITIVE Sensitive   ?  TRIMETH/SULFA <=10 SENSITIVE Sensitive   ?  CLINDAMYCIN <=0.25 SENSITIVE Sensitive   ?  RIFAMPIN <=0.5 SENSITIVE Sensitive   ?  Inducible Clindamycin NEGATIVE Sensitive   ?  * FEW STAPHYLOCOCCUS AUREUS  ?Blood culture (routine x 2)     Status: None (Preliminary result)  ? Collection Time: 10/21/21  9:30 PM  ? Specimen: BLOOD  ?Result Value Ref Range Status  ? Specimen Description BLOOD RIGHT HAND  Final  ? Special Requests   Final  ?  BOTTLES DRAWN AEROBIC AND ANAEROBIC Blood Culture results may not be optimal due to an inadequate volume of blood received in culture bottles  ? Culture   Final  ?  NO GROWTH 4 DAYS ?Performed at [facility], [place] [address], [place], [place] [address] ?  ? Report Status PENDING  Incomplete  ?Blood culture (routine x 2)     Status: None (Preliminary result)  ? Collection Time: 10/21/21 11:49 PM  ? Specimen: BLOOD  ?Result Value Ref Range Status  ? Specimen Description BLOOD LEFT HAND  Final  ? Special Requests   Final  ?  BOTTLES DRAWN AEROBIC AND ANAEROBIC Blood Culture results may not be optimal due to an inadequate volume of blood received in culture bottles  ? Culture   Final  ?  NO GROWTH 3 DAYS ?Performed at [facility], [place] [address], [place], [place] [address] ?  ? Report Status PENDING  Incomplete  ?MRSA Next Gen by PCR, Nasal     Status: None  ? Collection Time:  10/24/21  5:18 AM  ?Result Value Ref Range Status  ? MRSA by PCR Next Gen NOT DETECTED NOT DETECTED Final  ?  Comment: (NOTE) ?The GeneXpert MRSA Assay (FDA approved for NASAL specimens only), ?is one compone

## 2021-10-25 NOTE — Progress Notes (Signed)
Right upper extremity venous duplex completed. ? ?10/25/2021 11:10 AM ?Kelby Aline., MHA, RVT, RDCS, RDMS   ?

## 2021-10-25 NOTE — TOC Benefit Eligibility Note (Signed)
Patient Advocate Encounter ? ?Prior Authorization for linezolid (Zyvox) 600 mg tablets has been approved.   ? ?PA# [id number] ?Effective dates: 10/25/2021 through 11/24/2021 ? ?Patients co-pay is $10.00.  ? ? ? ?Lyndel Safe, CPhT ?Pharmacy Patient Advocate Specialist ?[facility] Patient Advocate Team ?Direct Number: [phone]  Fax: [phone]  ?

## 2021-10-25 NOTE — Progress Notes (Signed)
? ?[facility] Infectious Diseases Follow Up Note ? ?Patient Identification: ?Patient Name: Brooke Baldwin MRN: [id number] [place] Date: 10/21/2021  5:43 PM ?Age: 46 y.o.Today's Date: 10/25/2021 ? ?Reason for Visit: HD catheter infection  ? ?Principal Problem: ?  Central line insertion site infection ?Active Problems: ?  HIV (human immunodeficiency virus infection) ([place]) ?  End stage renal disease ([place]) ?  At risk for central line-associated bloodstream infection (CLABSI) ?  Immunosuppressed status ([place]) ? ?Antibiotics:  ?Vancomycin 3/18-c ?Cefepime 3/18- 3/21 ?  ?Lines/Hardware: left forearm AV graft  ?  ?Interval Events: continues to be afebrile, no leukcoytosis. [place] chest with following abnormality  ? ? ?Assessment ?# HD catheter site infection/associated thrombosis without bacteremia( MSSA) ?- 3/18 blood cx both sets no growth in 3 days ( no prior abtx) ?- HD catheter removed on 3/19 by VVS ?- [place] chest with probable scarring vs clot in the jugular vein ?- Vascular US acute, occlusive superficial jugular venous thrombosis noted in the upper right  chest in the area of recent tunneled catheter removal. ?  ?# ESRD on HD  - has old AVG in left forearm without any issues ( non working). Nephrology following. Patient is refusing new tunneled HD catheter placement ?  ?# HIV/AIDS, well controlled - on Biktarvy,  follows Dr Juleen [facility] at [facility] ?  ?# Immunosuppressed - on atovaquone for PJP ppx ? ? ?Recommendations ?Will switch Vancomycin to cefazolin given staph aureus is MSSA, plan for 4 weeks from 3/18 if patient agreeable to have new HD line placed.  If patient refuses to have new HD placed, can do linezolid on discharge for same duration.  ? ?Need to fu with PCP in 2 weeks for CBC check  ? ?Continue Biktarvy and atovaquone as is. She would like to continue HIV care at [facility] after visit to [facility].  ? ?ID available as needed. Please call with questions  ? ?Rest of the  management as per the primary team. ?Thank you for the consult. Please page with pertinent questions or concerns. ? ?______________________________________________________________________ ?Subjective ?patient seen and examined at the bedside.  ?Pain at the HD catheter site is better ? ?Vitals ?BP 134/83 (BP Location: Right Arm)   Pulse 69   Temp 98 ?F (36.7 ?C)   Resp 18   Ht '5\' 2"'$  (1.575 m)   Wt 83.5 kg   LMP 04/02/2015 (Approximate)   SpO2 100%   BMI 33.67 kg/m?  ? ?  ?Physical Exam ?Constitutional:  sitting up in bed ?   Comments:  ? ?Cardiovascular:  ?   Rate and Rhythm: Normal rate and regular rhythm.  ?   Heart sounds:  ? ?Pulmonary:  ?   Effort: Pulmonary effort is normal.  ?   Comments:  ? ?Abdominal:  ?   Palpations: Abdomen is soft.  ?   Tenderness:  ? ?Musculoskeletal:     ?   General: No swelling or tenderness.  ? ?Skin: ?   Comments: Rt chest HD cath site - less tender and less swollen  ? ?Neurological:  ?   General: Grossly non focal, awake, alert and oriented  ? ?Psychiatric:     ?   Mood and Affect: Mood normal.  ? ? ?Pertinent Microbiology ?Results for orders placed or performed during the hospital encounter of 10/21/21  ?Aerobic/Anaerobic Culture w Gram Stain (surgical/deep wound)     Status: None (Preliminary result)  ? Collection Time: 10/21/21  5:43 PM  ? Specimen: Chest; Other  ?Result Value Ref  Range Status  ? Specimen Description CHEST  Final  ? Special Requests Immunocompromised  Final  ? Gram Stain   Final  ?  ABUNDANT WBC PRESENT, PREDOMINANTLY PMN ?RARE GRAM POSITIVE COCCI ?Performed at [facility], [place] [address] [place], [place] [address] ?  ? Culture   Final  ?  FEW STAPHYLOCOCCUS AUREUS ?NO ANAEROBES ISOLATED; CULTURE IN PROGRESS FOR 5 DAYS ?  ? Report Status PENDING  Incomplete  ? Organism ID, Bacteria STAPHYLOCOCCUS AUREUS  Final  ?    Susceptibility  ? Staphylococcus aureus - MIC*  ?  CIPROFLOXACIN <=0.5 SENSITIVE Sensitive   ?  ERYTHROMYCIN <=0.25 SENSITIVE  Sensitive   ?  GENTAMICIN <=0.5 SENSITIVE Sensitive   ?  OXACILLIN <=0.25 SENSITIVE Sensitive   ?  TETRACYCLINE <=1 SENSITIVE Sensitive   ?  VANCOMYCIN 1 SENSITIVE Sensitive   ?  TRIMETH/SULFA <=10 SENSITIVE Sensitive   ?  CLINDAMYCIN <=0.25 SENSITIVE Sensitive   ?  RIFAMPIN <=0.5 SENSITIVE Sensitive   ?  Inducible Clindamycin NEGATIVE Sensitive   ?  * FEW STAPHYLOCOCCUS AUREUS  ?Blood culture (routine x 2)     Status: None (Preliminary result)  ? Collection Time: 10/21/21  9:30 PM  ? Specimen: BLOOD  ?Result Value Ref Range Status  ? Specimen Description BLOOD RIGHT HAND  Final  ? Special Requests   Final  ?  BOTTLES DRAWN AEROBIC AND ANAEROBIC Blood Culture results may not be optimal due to an inadequate volume of blood received in culture bottles  ? Culture   Final  ?  NO GROWTH 4 DAYS ?Performed at [facility], [place] [address], [place], [place] [address] ?  ? Report Status PENDING  Incomplete  ?Blood culture (routine x 2)     Status: None (Preliminary result)  ? Collection Time: 10/21/21 11:49 PM  ? Specimen: BLOOD  ?Result Value Ref Range Status  ? Specimen Description BLOOD LEFT HAND  Final  ? Special Requests   Final  ?  BOTTLES DRAWN AEROBIC AND ANAEROBIC Blood Culture results may not be optimal due to an inadequate volume of blood received in culture bottles  ? Culture   Final  ?  NO GROWTH 3 DAYS ?Performed at [facility], [place] [address] [place], [place] [address] ?  ? Report Status PENDING  Incomplete  ?MRSA Next Gen by PCR, Nasal     Status: None  ? Collection Time: 10/24/21  5:18 AM  ?Result Value Ref Range Status  ? MRSA by PCR Next Gen NOT DETECTED NOT DETECTED Final  ?  Comment: (NOTE) ?The GeneXpert MRSA Assay (FDA approved for NASAL specimens only), ?is one component of a comprehensive MRSA colonization surveillance ?program. It is not intended to diagnose MRSA infection nor to guide ?or monitor treatment for MRSA infections. ?Test performance is not FDA approved in patients less than  2 years ?old. ?Performed at [facility], [place] [address], [place], [place] ?[address] ?  ?Surgical pcr screen     Status: None  ? Collection Time: 10/25/21  8:14 AM  ? Specimen: Nasal Mucosa; Nasal Swab  ?Result Value Ref Range Status  ? MRSA, PCR NEGATIVE NEGATIVE Final  ? Staphylococcus aureus NEGATIVE NEGATIVE Final  ?  Comment: (NOTE) ?The Xpert SA Assay (FDA approved for NASAL specimens in patients 42 ?years of age and older), is one component of a comprehensive ?surveillance program. It is not intended to diagnose infection nor to ?guide or monitor treatment. ?Performed  at [facility], [place] [address] [place], [place] ?[address] ?  ? ? ?Pertinent Lab. ? ?  Latest Ref Rng & Units 10/25/2021  ?  5:08 AM 10/21/2021  ? 11:49 PM 10/21/2021  ?  6:23 PM  ?CBC  ?WBC 4.0 - 10.5 K/uL 5.3   7.7   8.3    ?Hemoglobin 12.0 - 15.0 g/dL 11.6   11.6   12.5    ?Hematocrit 36.0 - 46.0 % 35.8   35.4   38.9    ?Platelets 150 - 400 K/uL 208   168   195    ? ? ?  Latest Ref Rng & Units 10/25/2021  ?  5:08 AM 10/24/2021  ? 12:00 PM 10/21/2021  ? 11:49 PM  ?CMP  ?Glucose 70 - 99 mg/dL 87   84   177    ?BUN 6 - 20 mg/dL 70   58   57    ?Creatinine 0.44 - 1.00 mg/dL 6.59   6.44   6.46    ?Sodium 135 - 145 mmol/L 136   138   135    ?Potassium 3.5 - 5.1 mmol/L 4.7   5.0   4.4    ?Chloride 98 - 111 mmol/L 107   106   109    ?CO2 22 - 32 mmol/L '18   21   16    '$ ?Calcium 8.9 - 10.3 mg/dL 9.6   9.7   9.1    ? ? ? ?Pertinent Imaging today ?Plain films and CT images have been personally visualized and interpreted; radiology reports have been reviewed. Decision making incorporated into the Impression / Recommendations. ? ?[place] CHEST SOFT TISSUE ? ?Result Date: 10/25/2021 ?CLINICAL DATA:  Evaluate for abscess. EXAM: ULTRASOUND OF CHEST SOFT TISSUES TECHNIQUE: Ultrasound examination of the chest wall soft tissues was performed in the area of clinical concern. COMPARISON:  None. FINDINGS: Targeted sonographic images of the right side of the  chest and neck in the region of the clinical concern was performed. No mass, fluid collection, architectural distortion, or abnormal vascularity. There is incomplete compressibility of the visualized portion of the jugular vei

## 2021-10-25 NOTE — Progress Notes (Signed)
Requested to see pt due to pt planning to go to [facility] at d/c for a MD appt. Met with pt at bedside. Pt wants to go to MD appt in [place] and return back home (here) after appt. Offered to arrange out-pt clinic in [place] but pt states there is no need due to pt's plans to return home after appt. Pt also does not have an access for out-pt HD at this time. Pt states she is agreeable to [facility] being placed out-pt once she returns from appt in [place]. This information provided to attending and nephrologist. Pt voiced concerns to navigator regarding receiving TDC placement while inpt and feels she needs opinions/input from providers in [place]. Pt is requesting to speak with renal leadership. Contacted Ryanne Young with pt's request. Will assist as needed.  ? ?Melven Sartorius ?Renal Navigator ?[phone] ?

## 2021-10-25 NOTE — TOC Benefit Eligibility Note (Signed)
Patient Advocate Encounter ? ?Insurance verification completed.   ? ?The patient is currently admitted and upon discharge could be taking linezolid (Zyvox) 600 mg tablets. ? ?Requires Prior Authorization ? ?The patient is insured through [employer] ? ? ? ?Lyndel Safe, CPhT ?Pharmacy Patient Advocate Specialist ?[facility] Patient Advocate Team ?Direct Number: [phone]  Fax: [phone] ? ? ? ? ? ?  ?

## 2021-10-25 NOTE — TOC Transition Note (Signed)
Transition of Care (TOC) - CM/SW Discharge Note ? ? ?Patient Details  ?Name: Brooke Baldwin ?MRN: [id number] ?Date of Birth: 1976-01-16 ? ?Transition of Care (TOC) CM/SW Contact:  ?Tom-Johnson, Renea Ee, RN ?Phone Number: ?10/25/2021, 5:20 PM ? ? ?Clinical Narrative:    ? ?Patient is scheduled for discharge today. No TOC recommendations or needs noted. Family to transport at discharge. No further TOC needs noted. ? ?  ?  ? ? ?Patient Goals and CMS Choice ?  ?  ?  ? ?Discharge Placement ?  ?           ?  ?  ?  ?  ? ?Discharge Plan and Services ?  ?  ?           ?  ?  ?  ?  ?  ?  ?  ?  ?  ?  ? ?Social Determinants of Health (SDOH) Interventions ?  ? ? ?Readmission Risk Interventions ?   ? View : No data to display.  ?  ?  ?  ? ? ? ? ? ?

## 2021-10-25 NOTE — Progress Notes (Signed)
?[facility] ?Progress Note  ? ?Subjective: pt today wants placement of temp cath, not TDC, then get HD and remove it post HD.  She states that she is going back to [place] where her family is and will get any other procedures needed and her dialysis up there.   ? ?Objective ?Vitals:  ? 10/24/21 1759 10/24/21 2033 10/25/21 0600 10/25/21 0811  ?BP: 124/89 116/89 110/85 134/83  ?Pulse: 80 80  69  ?Resp: '18 18 18 18  '$ ?Temp: 98.2 ?F (36.8 ?C) 98 ?F (36.7 ?C) 98.3 ?F (36.8 ?C) 98 ?F (36.7 ?C)  ?TempSrc:  Oral Oral   ?SpO2: 100% 100%  100%  ?Weight:      ?Height:      ? ?Physical Exam ?General: Pleasant, WN, WD NAD ?Heart: S1,S2 RRR No M/R/G ?Lungs: CTAB A/P ?Abdomen: Soft, NABS ?Extremities: No LE edema ?Dialysis Access: None at present. Still mild swelling, tenderness RIJ area.  ?  ? ? ?Additional Objective ?Labs: ?Basic Metabolic Panel: ?Recent Labs  ?Lab 10/21/21 ?2349 10/24/21 ?1200 10/25/21 ?5009  ?NA 135 138 136  ?K 4.4 5.0 4.7  ?CL 109 106 107  ?CO2 16* 21* 18*  ?GLUCOSE 177* 84 87  ?BUN 57* 58* 70*  ?CREATININE 6.46* 6.44* 6.59*  ?CALCIUM 9.1 9.7 9.6  ?PHOS  --  5.0* 4.9*  ? ? ?Liver Function Tests: ?Recent Labs  ?Lab 10/21/21 ?1823 10/24/21 ?1200 10/25/21 ?3818  ?AST 40  --   --   ?ALT 46*  --   --   ?ALKPHOS 136*  --   --   ?BILITOT 0.4  --   --   ?PROT 7.8  --   --   ?ALBUMIN 3.5 3.2* 2.9*  ? ? ?Recent Labs  ?Lab 10/21/21 ?1823  ?LIPASE 107*  ? ? ?CBC: ?Recent Labs  ?Lab 10/21/21 ?1823 10/21/21 ?2349 10/25/21 ?2993  ?WBC 8.3 7.7 5.3  ?NEUTROABS 5.4  --   --   ?HGB 12.5 11.6* 11.6*  ?HCT 38.9 35.4* 35.8*  ?MCV 106.0* 104.1* 103.2*  ?PLT 195 168 208  ? ? ?Blood Culture ?   ?Component Value Date/Time  ? SDES BLOOD LEFT HAND 10/21/2021 2349  ? SPECREQUEST  10/21/2021 2349  ?  BOTTLES DRAWN AEROBIC AND ANAEROBIC Blood Culture results may not be optimal due to an inadequate volume of blood received in culture bottles  ? CULT  10/21/2021 2349  ?  NO GROWTH 3 DAYS ?Performed at [facility] [place] [address], [place], [place] [address] ?  ? REPTSTATUS PENDING 10/21/2021 2349  ? ? ?Cardiac Enzymes: ?No results for input(s): CKTOTAL, CKMB, CKMBINDEX, TROPONINI in the last 168 hours. ?CBG: ?No results for input(s): GLUCAP in the last 168 hours. ?Iron Studies: No results for input(s): IRON, TIBC, TRANSFERRIN, FERRITIN in the last 72 hours. ?'@lablastinr3'$ @ ?Studies/Results: ?[place] CHEST SOFT TISSUE ? ?Result Date: 10/25/2021 ?CLINICAL DATA:  Evaluate for abscess. EXAM: ULTRASOUND OF CHEST SOFT TISSUES TECHNIQUE: Ultrasound examination of the chest wall soft tissues was performed in the area of clinical concern. COMPARISON:  None. FINDINGS: Targeted sonographic images of the right side of the chest and neck in the region of the clinical concern was performed. No mass, fluid collection, architectural distortion, or abnormal vascularity. There is incomplete compressibility of the visualized portion of the jugular vein with peripheral clot or scarring, possibly related to catheter placement. There is an 8 mm somewhat spongiform nodule in the right thyroid lobe. Not clinically significant; no follow-up imaging recommended (  ref: J Am Coll Radiol. 2015 Feb;12(2): 143-50). IMPRESSION: 1. No abscess or fluid collection. 2. Probable scarring of the visualized vein related to catheter placement. Dedicated duplex ultrasound of the neck veins recommended for better evaluation. These results will be called to the ordering clinician or representative by the Radiologist Assistant, and communication documented in the PACS or [employer]. Electronically Signed   By: Anner Crete M.D.   On: 10/25/2021 01:14   ?Medications: ? vancomycin    ? ? atovaquone  1,500 mg Oral Daily  ? bictegravir-emtricitabine-tenofovir AF  1 tablet Oral QHS  ? Chlorhexidine Gluconate Cloth  6 each Topical Q0600  ? heparin  5,000 Units Subcutaneous Q8H  ? multivitamin  1 tablet Oral q morning  ? ? ? ?HD orders: [facility] MWF ?4:15 hrs 300/Autoflow 1.5 2.0  K/2.0 Ca 82 kg  ?-Heparin 3000 units IV TIW ?-Calcitriol 2 mcg PO TIW ?  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 BC NGTD 2 days. On IV vanc per ID.  TDC removed 10/22/2021 by [facility] for line holiday. Refusing new TDC today.  [facility] is not offering temp cath access.  Have notified the patient that no temp cath will be placed. Pt states she is going back to [place] and that she will get "any other procedures I need" done there. ?ESRD -  on HD MWF. She had planned on doing PD but not able to place PD catheter D/T uterine fibroids. Patient is refusing AVF and TDC at this time. She requested temp HD cath but [facility] declined to place a temp cath. I discussed this w/ the patient and related my concern that if she leaves here without any HD access in place, and w/o a dedicated outpt HD unit, she could be putting her health at significant risk.  Pt states she has gone a month w/o dialysis before, and she is adamant not having any TDC placement or perm access done here.  ?Hypertension/volume  - euvolemic on exam, Bps stable.  ?Anemia of CKD - Hgb 11.6, no indication for ESA/Fe at this time ?Secondary Hyperparathyroidism -  Ca okay, will check PO4 in AM. Continue binders ?Nutrition - Renal with fluid restriction for now. NPO depending on when [facility] removal will occur ?HIV-Meds per primary ?Dispo - no further HD plans here, see above. Will ask renal SW if there is anything that can be done to assist in getting OP HD unit in [place].  ? ?Kelly Splinter, MD ?10/25/2021, 9:14 AM ? ? ? ? ? ? ?  ? ?

## 2021-10-26 ENCOUNTER — Other Ambulatory Visit: Payer: Self-pay | Admitting: Infectious Diseases

## 2021-10-26 ENCOUNTER — Telehealth: Payer: Self-pay

## 2021-10-26 ENCOUNTER — Encounter: Payer: Self-pay | Admitting: Internal Medicine

## 2021-10-26 DIAGNOSIS — Z5181 Encounter for therapeutic drug level monitoring: Secondary | ICD-10-CM

## 2021-10-26 LAB — CULTURE, BLOOD (ROUTINE X 2): Culture: NO GROWTH

## 2021-10-26 NOTE — Progress Notes (Unsigned)
{  Select_TRH_Note:[id number]} 

## 2021-10-26 NOTE — Telephone Encounter (Signed)
Left voicemail asking patient to return my call to schedule lab appointment for repeat CBC. I sent a message to patient via my chart also. ? ? ? ?Dortha Neighbors P Kaidyn Javid, CMA ? ?

## 2021-10-26 NOTE — Progress Notes (Signed)
Late entry note: ? ?Pt was d/c home yesterday afternoon. Contacted pt's clinic this am to provide update regarding pt's d/c and plans. ? ?Melven Sartorius ?Renal Navigator ?[phone] ?

## 2021-10-26 NOTE — Telephone Encounter (Signed)
-----   Message from Rosiland Oz, MD sent at 10/26/2021  3:37 PM EDT ----- ?Regarding: CBC check ?Please have a CBC done for her when she comes in for Hep B vaccine on 11/09/21. I have a standing order for CBC, Thanks.  ? ?

## 2021-10-27 LAB — AEROBIC/ANAEROBIC CULTURE W GRAM STAIN (SURGICAL/DEEP WOUND)

## 2021-10-27 LAB — CULTURE, BLOOD (ROUTINE X 2): Culture: NO GROWTH

## 2021-10-27 LAB — HEPATITIS B SURFACE ANTIBODY, QUANTITATIVE: Hep B S AB Quant (Post): <3.1 m[IU]/mL — ABNORMAL LOW (ref >9.9)

## 2021-11-07 ENCOUNTER — Inpatient Hospital Stay: Payer: POS | Admitting: Infectious Diseases

## 2021-11-08 ENCOUNTER — Ambulatory Visit (INDEPENDENT_AMBULATORY_CARE_PROVIDER_SITE_OTHER): Payer: POS

## 2021-11-08 ENCOUNTER — Other Ambulatory Visit: Payer: Self-pay

## 2021-11-08 ENCOUNTER — Other Ambulatory Visit: Payer: POS

## 2021-11-08 DIAGNOSIS — Z23 Encounter for immunization: Secondary | ICD-10-CM

## 2021-11-08 DIAGNOSIS — Z5181 Encounter for therapeutic drug level monitoring: Secondary | ICD-10-CM

## 2021-11-08 LAB — CBC
HCT: 39.5 % (ref 35.0–45.0)
Hemoglobin: 13.2 g/dL (ref 11.7–15.5)
MCH: 32.9 pg (ref 27.0–33.0)
MCHC: 33.4 g/dL (ref 32.0–36.0)
MCV: 98.5 fL (ref 80.0–100.0)
MPV: 9.3 fL (ref 7.5–12.5)
Platelets: 230 10*3/uL (ref 140–400)
RBC: 4.01 10*6/uL (ref 3.80–5.10)
RDW: 12.6 % (ref 11.0–15.0)
WBC: 4.6 10*3/uL (ref 3.8–10.8)

## 2021-11-09 ENCOUNTER — Ambulatory Visit: Payer: POS

## 2021-12-13 ENCOUNTER — Other Ambulatory Visit: Payer: Self-pay | Admitting: Obstetrics and Gynecology

## 2021-12-13 ENCOUNTER — Other Ambulatory Visit (HOSPITAL_COMMUNITY)
Admission: RE | Admit: 2021-12-13 | Discharge: 2021-12-13 | Disposition: A | Discharge status: Home / Self Care | Payer: POS | Source: Ambulatory Visit | Attending: Obstetrics and Gynecology | Admitting: Obstetrics and Gynecology

## 2021-12-13 DIAGNOSIS — Z1151 Encounter for screening for human papillomavirus (HPV): Secondary | ICD-10-CM | POA: Insufficient documentation

## 2021-12-13 DIAGNOSIS — Z01419 Encounter for gynecological examination (general) (routine) without abnormal findings: Secondary | ICD-10-CM | POA: Diagnosis present

## 2021-12-19 LAB — CYTOLOGY - PAP
Comment: NEGATIVE
Comment: NEGATIVE
Comment: NEGATIVE
HPV 16: NEGATIVE
HPV 18 / 45: NEGATIVE
High risk HPV: POSITIVE — AB

## 2021-12-26 ENCOUNTER — Encounter (HOSPITAL_COMMUNITY): Payer: Self-pay | Admitting: Emergency Medicine

## 2021-12-26 ENCOUNTER — Ambulatory Visit (HOSPITAL_COMMUNITY)
Admission: EM | Admit: 2021-12-26 | Discharge: 2021-12-26 | Disposition: A | Discharge status: Home / Self Care | Payer: POS | Attending: Family Medicine | Admitting: Family Medicine

## 2021-12-26 DIAGNOSIS — R1030 Lower abdominal pain, unspecified: Secondary | ICD-10-CM

## 2021-12-26 NOTE — ED Provider Notes (Signed)
[facility]    CSN: [id number] Arrival date & time: 12/26/21  [place]      History   Chief Complaint Chief Complaint  Patient presents with   Abdominal Pain   Urinary Frequency    HPI Brooke Baldwin is a 46 y.o. female.    Abdominal Pain Urinary Frequency Associated symptoms include abdominal pain.  Here for about a 4-day history of lower abdominal cramping.  It was very intense yesterday and the day before.  She did take some medicine and did have a bowel movement.  Is not certain if that actually helped how she felt or not.  She did throw up once yesterday after taking something for the constipation.  No dysuria or hematuria, but she has had some frequency.  No fever or chills.  She had a similar symptom complex back in December, but got better.  Urinalysis was negative at that time  She is on dialysis.  She also takes [employer].  She notes a history of fibroids.  No periods in about 7 years  Past Medical History:  Diagnosis Date   Anemia associated with chronic renal failure    Atrophic kidney    bilateral   CIN I (cervical intraepithelial neoplasia I)    ESRD (end stage renal disease) on dialysis ([place]) 05/2020   hemodialysis  T/ Th/ Sat  @ [facility] in [place], ( 10-17-2020  s/p left AVG 11/ 2021 unable to use as of yet, currently using right IJ tunneled cath (right side of upper chest) for dialysis   FSGS (focal segmental glomerulosclerosis)    renal biopsy 04-15-2020 in epic , secondary to HTN/ HIV/ Obese   Heart palpitations 11/ 2021  (10-17-2020  pt stated the palpitations does not bother her and has no other symptoms , stated they started 11/ 2021)   pt was been seen by cardiology, dr s. Patria Mane ([facility] in [place]),  per pt had event monitor for 17 days , was told normal with  no arrhythmias,  and stated echo is scheduled for 10-28-2020   History of vulvar dysplasia    HIV (human immunodeficiency virus infection) ([place])    followed by  ID-- dr a. wallace   Hypertension    S/P arteriovenous (AV) fistula creation 07-04-2020 [facility]    left forearm,  07-28-2020  angioplasty of thrombosis   Secondary hyperparathyroidism of renal origin ([place])    Tachycardia    Thrombocytopenia ([place]) 07/01/2020   Trigeminal neuralgia    ride side ---  neurologist--- dr Krista Blue   (10-17-2020  pt stated last episode about 4 months ago was mild)   VIN II (vulvar intraepithelial neoplasia II)     Patient Active Problem List   Diagnosis Date Noted   Immunosuppressed status ([place])    At risk for central line-associated bloodstream infection (CLABSI) 10/21/2021   Central line insertion site infection 10/21/2021   Hyperkalemia 08/12/2021   Adnexal mass 05/31/2021   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   Need for hepatitis B booster vaccination 09/30/2020   Need for pneumococcal vaccination 09/30/2020   Need for COVID-19 vaccine 09/30/2020   Palpitations 09/08/2020   Class 1 obesity due to excess calories with serious comorbidity and body mass index (BMI) of 31.0 to 31.9 in adult 09/08/2020   Hypertension 09/08/2020   Healthcare maintenance 08/19/2020   Need for pneumocystis prophylaxis 07/27/2020   Vaccine counseling 07/27/2020   Hypoalbuminemia 07/01/2020   Secondary hyperparathyroidism ([place]) 07/01/2020   HIV (human immunodeficiency  virus infection) ([place]) 06/30/2020   End stage renal disease ([place]) 06/30/2020   Elevated blood pressure reading without diagnosis of hypertension 06/30/2020   Right trigeminal neuralgia 12/09/2019    Past Surgical History:  Procedure Laterality Date   A/V FISTULAGRAM Left 07/28/2020   Procedure: A/V FISTULAGRAM;  Surgeon: Marty Heck, MD;  Location: [facility];  Service: Cardiovascular;  Laterality: Left;   AV FISTULA PLACEMENT Left 07/04/2020   Procedure: INSERTION OF LEFT ARTERIOVENOUS (AV) GORE-TEX GRAFT ARM (ACUSEAL);  Surgeon: Cherre Robins, MD;  Location: [facility];  Service:  Vascular;  Laterality: Left;   CERVICAL BIOPSY  W/ LOOP ELECTRODE EXCISION  3295   CO2 LASER APPLICATION N/A 1/88/4166   Procedure: CO2 LASER APPLICATION;  Surgeon: Everitt Amber, MD;  Location: [facility];  Service: Gynecology;  Laterality: N/A;   INSERTION OF DIALYSIS CATHETER N/A 08/14/2021   Procedure: TUNNELED DIALYSIS CATHETER EXCHANGE;  Surgeon: Angelia Mould, MD;  Location: [facility];  Service: Vascular;  Laterality: N/A;   ORIF ANKLE FRACTURE Right 2012   per pt retained hardware   PERIPHERAL VASCULAR BALLOON ANGIOPLASTY Left 07/28/2020   Procedure: PERIPHERAL VASCULAR BALLOON ANGIOPLASTY;  Surgeon: Marty Heck, MD;  Location: [facility];  Service: Cardiovascular;  Laterality: Left;  AVF   VULVA SURGERY  2013   excision    OB History   No obstetric history on file.      Home Medications    Prior to Admission medications   Medication Sig Start Date End Date Taking? Authorizing Provider  atovaquone (MEPRON) 750 MG/5ML suspension Take 10 mLs (1,500 mg total) by mouth daily. Patient not taking: Reported on 10/22/2021 10/12/21   Mignon Pine, DO  bictegravir-emtricitabine-tenofovir AF (BIKTARVY) 50-200-25 MG TABS tablet Take 1 tablet by mouth at bedtime. 10/12/21 10/07/22  Mignon Pine, DO  ferric citrate (AURYXIA) 1 GM 210 MG(Fe) tablet Take 420 mg by mouth See admin instructions. Take 2 tablets (420 mg) by mouth once or twice daily with meals    [provider]  multivitamin (RENA-VIT) TABS tablet Take 1 tablet by mouth at bedtime.    [provider]    Family History Family History  Problem Relation Age of Onset   Diabetes Mother    Healthy Father    Breast cancer Maternal Aunt    Pancreatic cancer Maternal Grandfather    Colon cancer Neg Hx    Colon polyps Neg Hx    Esophageal cancer Neg Hx    Rectal cancer Neg Hx    Stomach cancer Neg Hx    Prostate cancer Neg Hx    Endometrial cancer Neg Hx    Ovarian cancer  Neg Hx     Social History Social History   Tobacco Use   Smoking status: Never   Smokeless tobacco: Never  Vaping Use   Vaping Use: Never used  Substance Use Topics   Alcohol use: Not Currently   Drug use: Not Currently     Allergies   Baclofen, Gabapentin, and Penicillins   Review of Systems Review of Systems  Gastrointestinal:  Positive for abdominal pain.  Genitourinary:  Positive for frequency.    Physical Exam Triage Vital Signs ED Triage Vitals  Enc Vitals Group     BP 12/26/21 1904 (!) 202/133     Pulse Rate 12/26/21 1904 74     Resp 12/26/21 1904 18     Temp 12/26/21 1904 98 F (36.7 C)  Temp Source 12/26/21 1904 Oral     SpO2 12/26/21 1904 98 %     Weight --      Height --      Head Circumference --      Peak Flow --      Pain Score 12/26/21 1903 9     Pain Loc --      Pain Edu? --      Excl. in [place]? --    No data found.  Updated Vital Signs BP (!) 202/133 (BP Location: Right Arm)   Pulse 74   Temp 98 F (36.7 C) (Oral)   Resp 18   LMP 04/02/2015 (Approximate)   SpO2 98%   Visual Acuity Right Eye Distance:   Left Eye Distance:   Bilateral Distance:    Right Eye Near:   Left Eye Near:    Bilateral Near:     Physical Exam Constitutional:      General: She is not in acute distress.    Appearance: She is not ill-appearing, toxic-appearing or diaphoretic.  HENT:     Mouth/Throat:     Mouth: Mucous membranes are moist.  Eyes:     Extraocular Movements: Extraocular movements intact.     Conjunctiva/sclera: Conjunctivae normal.     Pupils: Pupils are equal, round, and reactive to light.  Cardiovascular:     Rate and Rhythm: Normal rate and regular rhythm.     Heart sounds: No murmur heard. Pulmonary:     Effort: Pulmonary effort is normal.     Breath sounds: Normal breath sounds.  Abdominal:     Palpations: Abdomen is soft. There is no mass.     Comments: Tender in suprapubic area  Musculoskeletal:     Cervical back: Neck  supple.  Lymphadenopathy:     Cervical: No cervical adenopathy.  Skin:    Capillary Refill: Capillary refill takes less than 2 seconds.     Coloration: Skin is not jaundiced or pale.  Neurological:     General: No focal deficit present.     Mental Status: She is alert and oriented to person, place, and time.  Psychiatric:        Behavior: Behavior normal.     UC Treatments / Results  Labs (all labs ordered are listed, but only abnormal results are displayed) Labs Reviewed  POCT URINALYSIS DIPSTICK, ED / UC    EKG   Radiology No results found.  Procedures Procedures (including critical care time)  Medications Ordered in UC Medications - No data to display  Initial Impression / Assessment and Plan / UC Course  I have reviewed the triage vital signs and the nursing notes.  Pertinent labs & imaging results that were available during my care of the patient were reviewed by me and considered in my medical decision making (see chart for details).    She could not urinate for [place], despite 3 glasses of water (does make urine daily). In the room, she was obviously uncomfortable, rocking back and forth. I asked her to please go to the ER, for further evaluation.   She stated she might just come back in the AM; states it is very much like when she was here in [facility] note, her urinalysis showed only protein, but no WBC, and only a trace of hb.  I reiterated my recommendation for her to go to the ER tonight.  Final Clinical Impressions(s) / UC Diagnoses   Final diagnoses:  Lower abdominal pain  Discharge Instructions      Please proceed to the ER for further evaluation     ED Prescriptions   None    I have reviewed the PDMP during this encounter.   Barrett Henle, MD 12/26/21 2025

## 2021-12-26 NOTE — Discharge Instructions (Addendum)
Please proceed to the ER for further evaluation

## 2021-12-26 NOTE — ED Triage Notes (Signed)
Pt reports constant abd cramping for past 4 days. Having urinary frequency. Reports took medication today to make self have a BM. Pt adds that had no appetite and and having headache.

## 2021-12-26 NOTE — ED Notes (Signed)
Pt still unable to provide urine specimen. Pt reports that she has had three cups of water and full and doesn't want any more at this time when offered more water by staff.  Informed patient that would let Dr Windy Carina made aware.

## 2021-12-27 ENCOUNTER — Emergency Department (HOSPITAL_BASED_OUTPATIENT_CLINIC_OR_DEPARTMENT_OTHER): Payer: POS

## 2021-12-27 ENCOUNTER — Observation Stay (HOSPITAL_BASED_OUTPATIENT_CLINIC_OR_DEPARTMENT_OTHER)
Admission: EM | Admit: 2021-12-27 | Discharge: 2021-12-28 | Disposition: A | Discharge status: Home / Self Care | Payer: POS | Attending: Internal Medicine | Admitting: Internal Medicine

## 2021-12-27 ENCOUNTER — Other Ambulatory Visit: Payer: Self-pay

## 2021-12-27 ENCOUNTER — Encounter (HOSPITAL_BASED_OUTPATIENT_CLINIC_OR_DEPARTMENT_OTHER): Payer: Self-pay | Admitting: Radiology

## 2021-12-27 DIAGNOSIS — R1031 Right lower quadrant pain: Secondary | ICD-10-CM | POA: Diagnosis present

## 2021-12-27 DIAGNOSIS — I120 Hypertensive chronic kidney disease with stage 5 chronic kidney disease or end stage renal disease: Secondary | ICD-10-CM | POA: Diagnosis not present

## 2021-12-27 DIAGNOSIS — E6609 Other obesity due to excess calories: Secondary | ICD-10-CM | POA: Diagnosis not present

## 2021-12-27 DIAGNOSIS — Z95820 Peripheral vascular angioplasty status with implants and grafts: Secondary | ICD-10-CM | POA: Insufficient documentation

## 2021-12-27 DIAGNOSIS — D259 Leiomyoma of uterus, unspecified: Secondary | ICD-10-CM

## 2021-12-27 DIAGNOSIS — Z6831 Body mass index (BMI) 31.0-31.9, adult: Secondary | ICD-10-CM

## 2021-12-27 DIAGNOSIS — N186 End stage renal disease: Secondary | ICD-10-CM | POA: Diagnosis present

## 2021-12-27 DIAGNOSIS — Z79899 Other long term (current) drug therapy: Secondary | ICD-10-CM | POA: Insufficient documentation

## 2021-12-27 DIAGNOSIS — Z21 Asymptomatic human immunodeficiency virus [HIV] infection status: Secondary | ICD-10-CM | POA: Diagnosis present

## 2021-12-27 DIAGNOSIS — Z87898 Personal history of other specified conditions: Secondary | ICD-10-CM | POA: Diagnosis not present

## 2021-12-27 DIAGNOSIS — I10 Essential (primary) hypertension: Secondary | ICD-10-CM | POA: Diagnosis present

## 2021-12-27 DIAGNOSIS — E872 Acidosis, unspecified: Secondary | ICD-10-CM

## 2021-12-27 DIAGNOSIS — Z992 Dependence on renal dialysis: Secondary | ICD-10-CM | POA: Insufficient documentation

## 2021-12-27 DIAGNOSIS — E875 Hyperkalemia: Principal | ICD-10-CM | POA: Diagnosis present

## 2021-12-27 DIAGNOSIS — B20 Human immunodeficiency virus [HIV] disease: Secondary | ICD-10-CM | POA: Insufficient documentation

## 2021-12-27 LAB — CBC WITH DIFFERENTIAL/PLATELET
Abs Immature Granulocytes: 0.01 10*3/uL (ref 0.00–0.07)
Basophils Absolute: 0 10*3/uL (ref 0.0–0.1)
Basophils Relative: 1 %
Eosinophils Absolute: 0.3 10*3/uL (ref 0.0–0.5)
Eosinophils Relative: 6 %
HCT: 42.8 % (ref 36.0–46.0)
Hemoglobin: 13.6 g/dL (ref 12.0–15.0)
Immature Granulocytes: 0 %
Lymphocytes Relative: 16 %
Lymphs Abs: 0.9 10*3/uL (ref 0.7–4.0)
MCH: 32.4 pg (ref 26.0–34.0)
MCHC: 31.8 g/dL (ref 30.0–36.0)
MCV: 101.9 fL — ABNORMAL HIGH (ref 80.0–100.0)
Monocytes Absolute: 0.4 10*3/uL (ref 0.1–1.0)
Monocytes Relative: 7 %
Neutro Abs: 3.9 10*3/uL (ref 1.7–7.7)
Neutrophils Relative %: 70 %
Platelets: 274 10*3/uL (ref 150–400)
RBC: 4.2 MIL/uL (ref 3.87–5.11)
RDW: 16.1 % — ABNORMAL HIGH (ref 11.5–15.5)
WBC: 5.5 10*3/uL (ref 4.0–10.5)
nRBC: 0 % (ref 0.0–0.2)

## 2021-12-27 LAB — URINALYSIS, ROUTINE W REFLEX MICROSCOPIC
Bilirubin Urine: NEGATIVE
Glucose, UA: NEGATIVE mg/dL
Leukocytes,Ua: NEGATIVE
Nitrite: NEGATIVE
Protein, ur: >300 mg/dL — AB
Specific Gravity, Urine: 1.012 (ref 1.005–1.030)
pH: 7.5 (ref 5.0–8.0)

## 2021-12-27 LAB — COMPREHENSIVE METABOLIC PANEL
ALT: 16 U/L (ref 0–44)
AST: 18 U/L (ref 15–41)
Albumin: 4.4 g/dL (ref 3.5–5.0)
Alkaline Phosphatase: 147 U/L — ABNORMAL HIGH (ref 38–126)
Anion gap: 10 (ref 5–15)
BUN: 54 mg/dL — ABNORMAL HIGH (ref 6–20)
CO2: 18 mmol/L — ABNORMAL LOW (ref 22–32)
Calcium: 10.3 mg/dL (ref 8.9–10.3)
Chloride: 110 mmol/L (ref 98–111)
Creatinine, Ser: 5.99 mg/dL — ABNORMAL HIGH (ref 0.44–1.00)
GFR, Estimated: 8 mL/min — ABNORMAL LOW (ref >60)
Glucose, Bld: 62 mg/dL — ABNORMAL LOW (ref 70–99)
Potassium: 5.8 mmol/L — ABNORMAL HIGH (ref 3.5–5.1)
Sodium: 138 mmol/L (ref 135–145)
Total Bilirubin: 0.7 mg/dL (ref 0.3–1.2)
Total Protein: 8.5 g/dL — ABNORMAL HIGH (ref 6.5–8.1)

## 2021-12-27 LAB — PREGNANCY, URINE: Preg Test, Ur: NEGATIVE

## 2021-12-27 LAB — CBG MONITORING, ED
Glucose-Capillary: 69 mg/dL — ABNORMAL LOW (ref 70–99)
Glucose-Capillary: 70 mg/dL (ref 70–99)
Glucose-Capillary: 105 mg/dL — ABNORMAL HIGH (ref 70–99)

## 2021-12-27 LAB — GLUCOSE, CAPILLARY: Glucose-Capillary: 110 mg/dL — ABNORMAL HIGH (ref 70–99)

## 2021-12-27 LAB — LIPASE, BLOOD: Lipase: 83 U/L — ABNORMAL HIGH (ref 11–51)

## 2021-12-27 MED ORDER — DEXTROSE 50 % IV SOLN: 2.0000 | Freq: Once | INTRAVENOUS | Status: DC

## 2021-12-27 MED ORDER — SODIUM ZIRCONIUM CYCLOSILICATE 10 G PO PACK
10.0000 g | PACK | Freq: Once | ORAL | Status: AC
  Administered 2021-12-27: 10 g via ORAL
  Filled 2021-12-27: qty 1

## 2021-12-27 MED ORDER — AMLODIPINE BESYLATE 5 MG PO TABS
10.0000 mg | ORAL_TABLET | Freq: Once | ORAL | Status: AC
  Administered 2021-12-27: 10 mg via ORAL
  Filled 2021-12-27: qty 2

## 2021-12-27 MED ORDER — CLONIDINE HCL 0.1 MG PO TABS: 0.1000 mg | ORAL_TABLET | ORAL | Status: DC | PRN

## 2021-12-27 MED ORDER — SODIUM CHLORIDE 0.9 % IV SOLN: 1.0000 g | Freq: Once | INTRAVENOUS | Status: DC

## 2021-12-27 MED ORDER — IOHEXOL 9 MG/ML PO SOLN: 500.0000 mL | Freq: Once | ORAL | Status: DC

## 2021-12-27 MED ORDER — HYDRALAZINE HCL 25 MG PO TABS
50.0000 mg | ORAL_TABLET | Freq: Once | ORAL | Status: AC
  Administered 2021-12-27: 50 mg via ORAL
  Filled 2021-12-27: qty 2

## 2021-12-27 MED ORDER — HYDROCODONE-ACETAMINOPHEN 5-325 MG PO TABS
1.0000 | ORAL_TABLET | ORAL | Status: DC | PRN
  Administered 2021-12-27 – 2021-12-28 (×2): 2 via ORAL
  Filled 2021-12-27 (×2): qty 2

## 2021-12-27 MED ORDER — INSULIN REGULAR HUMAN 100 UNIT/ML IJ SOLN
7.0000 [IU] | Freq: Once | INTRAMUSCULAR | Status: DC
  Filled 2021-12-27: qty 3

## 2021-12-27 MED ORDER — BICTEGRAVIR-EMTRICITAB-TENOFOV 50-200-25 MG PO TABS
1.0000 | ORAL_TABLET | Freq: Every day | ORAL | Status: DC
  Filled 2021-12-27 (×2): qty 1

## 2021-12-27 MED ORDER — HEPARIN SODIUM (PORCINE) 5000 UNIT/ML IJ SOLN
5000.0000 [IU] | Freq: Three times a day (TID) | INTRAMUSCULAR | Status: DC
  Administered 2021-12-27 – 2021-12-28 (×3): 5000 [IU] via SUBCUTANEOUS
  Filled 2021-12-27 (×2): qty 1

## 2021-12-27 MED ORDER — SODIUM BICARBONATE 8.4 % IV SOLN
50.0000 meq | Freq: Once | INTRAVENOUS | Status: DC
Start: 2021-12-27 — End: 2021-12-27

## 2021-12-27 NOTE — ED Notes (Signed)
Provider notified of CBG apple juice given

## 2021-12-27 NOTE — Assessment & Plan Note (Signed)
Unstable. Will need to reinitiate HD. Pt with poor venous access. [place] from 10/2021 shows "There is incomplete compressibility of the visualized portion of the jugular vein with peripheral clot or scarring, possibly related to catheter placement."  May need addition vascular imaging to determine best location for HD catheter. Pt states in September 2022 she was going to have peritoneal dialysis catheter placed.  At the time of her surgery, surgery noted that the patient had a large uterine fibroid.  She states that she is ready seen GYN.  They want to perform a hysterectomy on her but are waiting her blood work to come back improved so they can proceed with surgery.  Patient may need GYN consult while she is here so that this can be planned.  She states that she sees Dr. Christophe Louis with [facility].

## 2021-12-27 NOTE — ED Notes (Signed)
Called [facility] to transport patient to [facility] 6N room# 28

## 2021-12-27 NOTE — Subjective & Objective (Signed)
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  Patient has been recently seen by GYN.  She was told that she has a uterine fibroid that needs to be removed.  Patient relates that back in September 2022 when she was getting ready for a peritoneal dialysis catheter placement, the surgeon noted intraoperatively that she had a very large uterine fibroid that prevented her from getting a peritoneal catheter.  She subsequently ended up getting a hemodialysis catheter infection and was hospitalized in March 2023.  She states that she had a very difficult time being in the hospital.  She ultimately ended up having the dialysis catheter removed and was placed on a line holiday at that time.  She states that she got very frustrated being in the hospital and felt that the providers were not listening to her.  She ultimately was discharged to home without a hemodialysis catheter.  Patient states that she originally moved down here to [facility] in 2021 as a job transfer for the [employer].  She tried to get transferred back to [place] where her family is from but has been unable to initiate transfer within the [employer].  When she was discharged in March 2023, she returned to [place] and saw a nephrologist in [place] who warned her that she need to restart dialysis.  Its been well over  2 months month since she had any dialysis.  She continues to urinate.  She returned to [place] after only spending a week in [place].  She continues to work at the [employer].  In the ER, CT abdomen pelvis was negative for any acute intra-abdominal pathology.  They did note her large uterine fibroid measuring 9.1 x 6.4 x 14.5 cm  Patient also noted to have hyperkalemia with a potassium of 5.8.  She was treated with  10 mg of p.o. Lokelma.  Patient transferred to [facility] for further care.

## 2021-12-27 NOTE — ED Notes (Signed)
Pt stated that her pain was ok at this time it just depended on how she was positioned. Also spoke to Pt about CBG levels. Pt stated that she felt fine.

## 2021-12-27 NOTE — ED Notes (Signed)
Report given to the Floor. 

## 2021-12-27 NOTE — ED Notes (Signed)
Meds being held until IV access can be obtained by the provider.

## 2021-12-27 NOTE — ED Triage Notes (Signed)
Pt arrived POV c/o intermittent right lower abdominal pain, bubbling, x 5 days. Was seen at [facility] last night for same, reports some relief after BM/passing gas but pain is back today. H/O Fibroids.

## 2021-12-27 NOTE — Assessment & Plan Note (Signed)
Hyperkalemia is a Acute illness/condition that poses a threat to life or bodily function. Admit to med telemetry bed. Pt given 10 mg lokelma in ER. Repeat BMP in AM. Due to pt not having received HD since March 2023.

## 2021-12-27 NOTE — Assessment & Plan Note (Addendum)
Uncontrolled.  Patient with no IV access.  Continue with clonidine 0.1 mg every 4 hours as needed for hypertension.  Continue with p.o. Norvasc 10 mg daily.

## 2021-12-27 NOTE — ED Notes (Signed)
Nephrol paged 15:46 AOM

## 2021-12-27 NOTE — ED Provider Notes (Signed)
[place] EMERGENCY DEPT Provider Note   CSN: [id number] Arrival date & time: 12/27/21  1122     History No chief complaint on file.   Brooke Baldwin is a 46 y.o. female history of end-stage renal disease, HIV presents to the emergency department for evaluation of right lower quadrant abdominal pain for the past 5 days.  Patient reports has been gradually improving and has become more intermittent instead of constant like it was in the beginning.  She reports she has been burping and been able to pass gas.  She reports she had 1 episode of vomiting on Monday that was nonbloody or coffee-ground emesis and happened after she drink the MiraLAX.  She denies any nausea.  She 1 bowel movement yesterday and reports it was soft and watery.  She denies any fever, chest pain, shortness of breath, melena, hematochezia, or any sick contacts.  She denies any pain like this before.  Patient is an end-stage renal disease patient and was on dialysis.  She had a recent catheter infection in March and they removed it and placed her on 30 days of antibiotics.  She has not had any dialysis since March 19 per patient.  She did not like the way she was treated at [facility] and did not want to go back further care.  She was seen yesterday at urgent care and was told to come to the ER.  Additionally she is mentioning some urinary urgency/frequency but denies any dysuria or hematuria.  She reports she does have a known history of a large fibroid in her uterus that she is seeing gynecology to get removed.  She reports she does still make urine. Her pain is relieved with passing gas.   HPI     Home Medications Prior to Admission medications   Medication Sig Start Date End Date Taking? Authorizing Provider  atovaquone (MEPRON) 750 MG/5ML suspension Take 10 mLs (1,500 mg total) by mouth daily. Patient not taking: Reported on 10/22/2021 10/12/21   Mignon Pine, DO   bictegravir-emtricitabine-tenofovir AF (BIKTARVY) 50-200-25 MG TABS tablet Take 1 tablet by mouth at bedtime. 10/12/21 10/07/22  Mignon Pine, DO  ferric citrate (AURYXIA) 1 GM 210 MG(Fe) tablet Take 420 mg by mouth See admin instructions. Take 2 tablets (420 mg) by mouth once or twice daily with meals    [provider]  multivitamin (RENA-VIT) TABS tablet Take 1 tablet by mouth at bedtime.    [provider]      Allergies    Baclofen, Gabapentin, and Penicillins    Review of Systems   Review of Systems  Constitutional:  Negative for chills and fever.  Respiratory:  Negative for cough and shortness of breath.   Cardiovascular:  Negative for chest pain.  Gastrointestinal:  Positive for abdominal pain, constipation and vomiting. Negative for anal bleeding, blood in stool, diarrhea, nausea and rectal pain.  Genitourinary:  Positive for frequency and urgency. Negative for dysuria, hematuria, pelvic pain, vaginal bleeding and vaginal discharge.  Musculoskeletal:  Negative for joint swelling.  Neurological:  Negative for weakness and headaches.   Physical Exam Updated Vital Signs BP (!) 131/94   Pulse 95   Temp 98.2 F (36.8 C)   Resp 18   Ht '5\' 1"'$  (1.549 m)   Wt 80.3 kg   LMP 04/02/2015 (Approximate)   SpO2 98%   BMI 33.44 kg/m  Physical Exam Vitals and nursing note reviewed.  Constitutional:      General: She  is not in acute distress.    Appearance: Normal appearance. She is not toxic-appearing.  HENT:     Head: Normocephalic and atraumatic.     Mouth/Throat:     Mouth: Mucous membranes are moist.  Eyes:     General: No scleral icterus. Cardiovascular:     Rate and Rhythm: Normal rate and regular rhythm.     Pulses: Normal pulses.  Pulmonary:     Effort: Pulmonary effort is normal. No respiratory distress.     Breath sounds: Normal breath sounds.  Abdominal:     General: Abdomen is flat. Bowel sounds are normal.     Palpations: Abdomen is soft.  There is mass.     Tenderness: There is abdominal tenderness. There is no right CVA tenderness, left CVA tenderness or guarding.     Comments: Palpable mass in the lower abdomen consistent with patient's fibroid.  She has mild suprapubic and right lower quadrant tenderness to palpation.  No guarding or rebound.  Normal active bowel sounds.  No overlying skin changes noted.  No abdominal distention.  Musculoskeletal:        General: No deformity.     Cervical back: Normal range of motion.  Skin:    General: Skin is warm and dry.  Neurological:     General: No focal deficit present.     Mental Status: She is alert. Mental status is at baseline.    ED Results / Procedures / Treatments   Labs (all labs ordered are listed, but only abnormal results are displayed) Labs Reviewed  CBC WITH DIFFERENTIAL/PLATELET - Abnormal; Notable for the following components:      Result Value   MCV 101.9 (*)    RDW 16.1 (*)    All other components within normal limits  COMPREHENSIVE METABOLIC PANEL - Abnormal; Notable for the following components:   Potassium 5.8 (*)    CO2 18 (*)    Glucose, Bld 62 (*)    BUN 54 (*)    Creatinine, Ser 5.99 (*)    Total Protein 8.5 (*)    Alkaline Phosphatase 147 (*)    GFR, Estimated 8 (*)    All other components within normal limits  LIPASE, BLOOD - Abnormal; Notable for the following components:   Lipase 83 (*)    All other components within normal limits  URINALYSIS, ROUTINE W REFLEX MICROSCOPIC - Abnormal; Notable for the following components:   Hgb urine dipstick TRACE (*)    Ketones, ur TRACE (*)    Protein, ur >300 (*)    All other components within normal limits  CBG MONITORING, ED - Abnormal; Notable for the following components:   Glucose-Capillary 69 (*)    All other components within normal limits  URINE CULTURE  PREGNANCY, URINE    EKG EKG Interpretation  Date/Time:  Wednesday Dec 27 2021 16:05:24 EDT Ventricular Rate:  64 PR  Interval:  135 QRS Duration: 76 QT Interval:  433 QTC Calculation: 447 R Axis:   27 Text Interpretation: Sinus rhythm Abnormal R-wave progression, early transition when compared to prior, similar appearance. No STEMI Confirmed by Antony Blackbird [phone]) on 12/27/2021 4:15:23 PM  Radiology CT ABDOMEN PELVIS WO CONTRAST  Result Date: 12/27/2021 CLINICAL DATA:  Right lower quadrant abdominal pain for 5 days currently on dialysis. History of CKD. EXAM: CT ABDOMEN AND PELVIS WITHOUT CONTRAST TECHNIQUE: Multidetector CT imaging of the abdomen and pelvis was performed following the standard protocol without IV contrast. RADIATION DOSE REDUCTION: This exam was  performed according to the departmental dose-optimization program which includes automated exposure control, adjustment of the mA and/or kV according to patient size and/or use of iterative reconstruction technique. COMPARISON:  None Available. FINDINGS: Lower chest: No acute abnormality. Hepatobiliary: No focal liver abnormality is seen. No gallstones, gallbladder wall thickening, or biliary dilatation. Pancreas: Unremarkable. No pancreatic ductal dilatation or surrounding inflammatory changes. Spleen: Normal in size without focal abnormality. Adrenals/Urinary Tract: Adrenal glands are unremarkable. Bilateral renal atrophy. No evidence of nephrolithiasis or hydronephrosis. No appreciable renal mass on this noncontrast enhanced examination. Bladder is unremarkable. Stomach/Bowel: Stomach is within normal limits. Appendix appears normal. No evidence of bowel wall thickening, distention, or inflammatory changes. Vascular/Lymphatic: No significant vascular findings are present. No enlarged abdominal or pelvic lymph nodes. Reproductive: Large pedunculated peripherally calcified degenerative fibroid measuring at least 9.1 x 6.4 x 14.5 cm. Fat containing paraumbilical hernia with mild fat stranding. Musculoskeletal: No acute or significant osseous findings.  IMPRESSION: 1. Large pedunculated peripherally calcified fibroid measuring at least 9.1 x 6.4 x 14.5 cm. 2. Bowel loops are normal in caliber. Normal appendix. No evidence of colitis or diverticulitis. 3. Mild atrophy of bilateral kidneys consistent with history of CKD. No acute abnormality. 4. Fat containing periumbilical hernia with mild fat stranding. Correlate with localized pain. Electronically Signed   By: Keane Police D.O.   On: 12/27/2021 14:58    Procedures Procedures   Medications Ordered in ED Medications  iohexol (OMNIPAQUE) 9 MG/ML oral solution 500 mL (has no administration in time range)  calcium gluconate 1 g in sodium chloride 0.9 % 100 mL IVPB (has no administration in time range)  dextrose 50 % solution 100 mL (has no administration in time range)  insulin regular (NOVOLIN R) 100 units/mL injection 7 Units (has no administration in time range)  sodium bicarbonate injection 50 mEq (has no administration in time range)  amLODipine (NORVASC) tablet 10 mg (10 mg Oral Given 12/27/21 1457)  sodium zirconium cyclosilicate (LOKELMA) packet 10 g (10 g Oral Given 12/27/21 1537)  hydrALAZINE (APRESOLINE) tablet 50 mg (50 mg Oral Given 12/27/21 1535)    ED Course/ Medical Decision Making/ A&P Clinical Course as of 12/27/21 1842  Wed Dec 27, 2021  1515 Total Bilirubin: 0.7 [JH]    Clinical Course User Index [JH] Luna Fuse, MD                           Medical Decision Making Amount and/or Complexity of Data Reviewed Labs: ordered. Decision-making details documented in ED Course. Radiology: ordered.  Risk OTC drugs. Prescription drug management. Decision regarding hospitalization.    46 year old female presents the emergency department for evaluation of RLQ abdominal pain.  Differential diagnosis includes but is not limited to appendicitis, colitis, diverticulitis, gas pain, small bowel obstruction, MSK, ovarian torsion, ovarian cyst, constipation.  Vital signs show  elevated blood pressure, otherwise afebrile, normal pulse rate, satting well room air without increased work of breathing.  Physical exam is pertinent for palpable mass in the lower abdomen consistent with patient's fibroid.  She has mild suprapubic and right lower quadrant tenderness to palpation.  No guarding or rebound.  Normal active bowel sounds.  No overlying skin changes noted.  No abdominal distention. .  Given the patient's right lower quadrant tenderness, will order abdominal CT scan without contrast given her elevated creatinine.  I independently reviewed and interpreted the patient's labs and imaging agree with radiologist interpretation.  CT of her abdomen  shows large pedunculated partially calcified fibroid measuring least 9.1 x 6.4 x 14.5 cm.  Bowel loops are normal in caliber.  There is normal appendix.  No evidence of any colitis or diverticulitis.  She does have some mild atrophy of bilateral kidneys consistent with history of CKD.  There is no acute abnormality.  She has a fat-containing periumbilical hernia with normal fat stranding and recommended correlation with localized pain.  Patient's pain is mainly in the suprapubic/right lower quadrant.  She does not have any periumbilical tenderness palpation.  CBC shows no leukocytosis or anemia.  CMP shows mildly increased potassium at 5.8.  She does have a small decrease in bicarb at 18 otherwise.  Glucose is decreased at 62.  She does have an elevated creatinine at 5.99 with a BUN of 54 as well.  Lipase slightly elevated 83.  Urine shows trace hemoglobin with trace ketones and greater than 30 protein.  Will order urine culture given her longer lasting symptoms although urine is not consistent with UTI.  Given that the patient has not had dialysis since March, as well as her increased in potassium, I did place a consult to nephrology.  Consult return promptly and spoke to Dr. Augustin Coupe.  Although the patient does not have any EKG changes and her  potassium is only slightly elevated at 5.8, he did recommend that I ordered the order set for hyperkalemia.  The patient reports that she is more amenable to getting any catheter placement for dialysis, but she is not interested in getting a fistula.  We will admit her to medicine for dialysis as well as new access and repeat of her CMP.  I likely think her abdominal pain may be gas related as she has relief with her abdominal pain from passing gas.  She also may have displacement of some of her internal organs due to the large fibroid on her uterus.    I discussed the case with the admitting doctor and recommended repeat BMP before ordering the hyperkalemia order set as she does have a history of hypokalemia as well and like for this to be checked before ordering all of these medications.  He verbalized understanding and agrees.  She did however receive some Lokelma, as well as some hydralazine and amlodipine for her blood pressure. Admitted to Dr. [place]   I discussed this case with my attending physician who cosigned this note including patient's presenting symptoms, physical exam, and planned diagnostics and interventions. Attending physician stated agreement with plan or made changes to plan which were implemented.   Final Clinical Impression(s) / ED Diagnoses Final diagnoses:  ESRD on dialysis [facility])  Hyperkalemia    Rx / DC Orders ED Discharge Orders     None         Sherrell Puller, Hershal Coria 12/28/21 2233    Luna Fuse, MD 12/29/21 [phone]

## 2021-12-27 NOTE — Assessment & Plan Note (Signed)
Stable

## 2021-12-27 NOTE — Assessment & Plan Note (Signed)
Stable.  Patient maintained on home regimen patient Biktarvy 50/200/25 daily.

## 2021-12-27 NOTE — ED Notes (Signed)
IV never obtained by the provider.

## 2021-12-27 NOTE — ED Notes (Signed)
Multiple attempts 2 rns to gain IV access. Left arm restricted.

## 2021-12-27 NOTE — Assessment & Plan Note (Signed)
Stable.  Currently being evaluated for hysterectomy by [facility].  Patient continues to complain of pain.  CT abdomen pelvis negative for acute intra-abdominal pathology.  Patient had some improvement with abdominal pain after bowel movement.  Patient seen by nephrology who does not feel patient needs emergent hemodialysis at this time recommending close outpatient follow-up with repeat labs and determination as to when patient may be able to get hysterectomy.  -Outpatient follow-up with GYN.

## 2021-12-27 NOTE — Assessment & Plan Note (Signed)
Chronic. Patient with a history of difficult peripheral IV access.  She states that the only place that they can get blood is through her hand veins.  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  Explained to her that most commonly AV fistulas/grafts are placed in the upper extremities.  Then HD catheter placed in the internal jugular veins.  Then in the groin veins.  And then lastly in the lumbar area. Patient remained stable.  No need for hemodialysis at this time and as such access to be deferred to the outpatient setting when and if needed.

## 2021-12-27 NOTE — ED Notes (Addendum)
Report given to [facility]. 

## 2021-12-27 NOTE — ED Notes (Signed)
[place] held until arrival at the receiving facility. Held due to length of procedure and arrival of transport team to transport.

## 2021-12-27 NOTE — Plan of Care (Signed)
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  Was found to have hypercalcemia with potassium 5.8.  Has not been receiving dialysis since March when her HD catheter was removed due to bacteremia.  ED PA discussed with nephrology, Dr. Augustin Coupe who recommended transfer to [facility] to reinitiate hemodialysis.  Patient now okay for HD catheter placement, but declines AV fistula at this time.  Plan of Care:  --Accepted to medical telemetry bed, observation status, [facility]   [facility] will assume care on arrival to accepting facility. Until arrival, care as per EDP. However, TRH available 24/7 for questions and assistance.   Please page [place] and Consults [phone]) as soon as the patient arrives to the hospital.   Addyson Traub [place] DO

## 2021-12-27 NOTE — H&P (Signed)
History and Physical    Brooke Baldwin QPY:[id number] DOB: 16-Apr-1976 DOA: 12/27/2021  DOS: the patient was seen and examined on 12/27/2021  PCP: System, Provider Not In   Patient coming from: Home  I have personally briefly reviewed patient's old medical records in [facility].  Patient hospitalized in March 2023 for hemodialysis catheter infection.  Was ultimately discharged to home with no hemodialysis catheter per her choice.  Patient went to [place] to visit her family.  Was told by her local nephrologist in [place] that she needs to restart dialysis.  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  Patient has been recently seen by GYN.  She was told that she has a uterine fibroid that needs to be removed.  Patient relates that back in September 2022 when she was getting ready for a peritoneal dialysis catheter placement, the surgeon noted intraoperatively that she had a very large uterine fibroid that prevented her from getting a peritoneal catheter.  She subsequently ended up getting a hemodialysis catheter infection and was hospitalized in March 2023.  She states that she had a very difficult time being in the hospital.  She ultimately ended up having the dialysis catheter removed and was placed on a line holiday at that time.  She states that she got very frustrated being in the hospital and felt that the providers were not listening to her.  She ultimately was discharged to home without a hemodialysis catheter.  Patient states that she originally moved down here to [facility] in 2021 as a job transfer for the [employer].  She tried to get transferred back to [place] where her family is from but has been unable to initiate transfer within the [employer].  When she was  discharged in March 2023, she returned to [place] and saw a nephrologist in [place] who warned her that she need to restart dialysis.  Its been well over  2 months month since she had any dialysis.  She continues to urinate.  She returned to [place] after only spending a week in [place].  She continues to work at the [employer].  In the ER, CT abdomen pelvis was negative for any acute intra-abdominal pathology.  They did note her large uterine fibroid measuring 9.1 x 6.4 x 14.5 cm  Patient also noted to have hyperkalemia with a potassium of 5.8.  She was treated with 10 mg of p.o. Lokelma.  Patient transferred to [facility] for further care.   ED Course: CT abdomen pelvis negative for acute intra-abdominal pathology.  Patient had hyperkalemia with potassium 5.8.  She was treated with 10 mg of p.o. Lokelma.  Review of Systems:  Review of Systems  Constitutional:  Positive for malaise/fatigue.       Positive anorexia for 3 days.  Patient states that she has been eating very little.  HENT: Negative.    Eyes: Negative.   Respiratory: Negative.    Cardiovascular: Negative.   Gastrointestinal:  Positive for abdominal pain and constipation.       Has been constipated for 3 days.  Tried some MiraLAX.  Genitourinary: Negative.   Musculoskeletal: Negative.   Skin: Negative.   Neurological: Negative.   Endo/Heme/Allergies: Negative.   Psychiatric/Behavioral: Negative.    All other systems reviewed and are negative.  Past Medical History:  Diagnosis Date   Anemia associated with chronic renal failure    Atrophic kidney    bilateral   CIN I (cervical intraepithelial neoplasia I)    ESRD (end stage renal disease) on dialysis ([place]) 05/2020   hemodialysis  T/ Th/ Sat  @ [facility] ([address] in [place], ( 10-17-2020  s/p left AVG 11/ 2021 unable to use as of yet, currently using right IJ tunneled cath (right side of upper chest) for dialysis   FSGS (focal segmental  glomerulosclerosis)    renal biopsy 04-15-2020 in epic , secondary to HTN/ HIV/ Obese   Heart palpitations 11/ 2021  (10-17-2020  pt stated the palpitations does not bother her and has no other symptoms , stated they started 11/ 2021)   pt was been seen by cardiology, dr s. Patria Mane ([facility] in [place]),  per pt had event monitor for 17 days , was told normal with  no arrhythmias,  and stated echo is scheduled for 10-28-2020   History of vulvar dysplasia    HIV (human immunodeficiency virus infection) ([place])    followed by ID-- dr a. wallace   Hypertension    S/P arteriovenous (AV) fistula creation 07-04-2020 [facility]    left forearm,  07-28-2020  angioplasty of thrombosis   Secondary hyperparathyroidism of renal origin ([place])    Tachycardia    Thrombocytopenia ([place]) 07/01/2020   Trigeminal neuralgia    ride side ---  neurologist--- dr Krista Blue   (10-17-2020  pt stated last episode about 4 months ago was mild)   VIN II (vulvar intraepithelial neoplasia II)     Past Surgical History:  Procedure Laterality Date   A/V FISTULAGRAM Left 07/28/2020   Procedure: A/V FISTULAGRAM;  Surgeon: Marty Heck, MD;  Location: [facility];  Service: Cardiovascular;  Laterality: Left;   AV FISTULA PLACEMENT Left 07/04/2020   Procedure: INSERTION OF LEFT ARTERIOVENOUS (AV) GORE-TEX GRAFT ARM (ACUSEAL);  Surgeon: Cherre Robins, MD;  Location: [facility];  Service: Vascular;  Laterality: Left;   CERVICAL BIOPSY  W/ LOOP ELECTRODE EXCISION  8341   CO2 LASER APPLICATION N/A 9/62/2297   Procedure: CO2 LASER APPLICATION;  Surgeon: Everitt Amber, MD;  Location: [facility];  Service: Gynecology;  Laterality: N/A;   INSERTION OF DIALYSIS CATHETER N/A 08/14/2021   Procedure: TUNNELED DIALYSIS CATHETER EXCHANGE;  Surgeon: Angelia Mould, MD;  Location: [facility];  Service: Vascular;  Laterality: N/A;   ORIF ANKLE FRACTURE Right 2012   per pt retained hardware   PERIPHERAL VASCULAR BALLOON  ANGIOPLASTY Left 07/28/2020   Procedure: PERIPHERAL VASCULAR BALLOON ANGIOPLASTY;  Surgeon: Marty Heck, MD;  Location: [facility];  Service: Cardiovascular;  Laterality: Left;  AVF   VULVA SURGERY  2013   excision     reports that she has never smoked. She has never used smokeless tobacco. She reports that she does not currently use alcohol. She reports that she does not currently use drugs.  Allergies  Allergen Reactions   Baclofen Other (See Comments)    Caused intolerable dizziness    Gabapentin Other (See Comments)    Caused intolerable dizziness   Penicillins Other (See Comments)    Unknown childhood reaction     Family History  Problem Relation Age of Onset   Diabetes Mother    Healthy Father    Breast cancer Maternal Aunt    Pancreatic cancer Maternal Grandfather    Colon cancer Neg Hx  Colon polyps Neg Hx    Esophageal cancer Neg Hx    Rectal cancer Neg Hx    Stomach cancer Neg Hx    Prostate cancer Neg Hx    Endometrial cancer Neg Hx    Ovarian cancer Neg Hx     Prior to Admission medications   Medication Sig Start Date End Date Taking? Authorizing Provider  atovaquone (MEPRON) 750 MG/5ML suspension Take 10 mLs (1,500 mg total) by mouth daily. Patient not taking: Reported on 10/22/2021 10/12/21   Mignon Pine, DO  bictegravir-emtricitabine-tenofovir AF (BIKTARVY) 50-200-25 MG TABS tablet Take 1 tablet by mouth at bedtime. 10/12/21 10/07/22  Mignon Pine, DO  ferric citrate (AURYXIA) 1 GM 210 MG(Fe) tablet Take 420 mg by mouth See admin instructions. Take 2 tablets (420 mg) by mouth once or twice daily with meals    [provider]  multivitamin (RENA-VIT) TABS tablet Take 1 tablet by mouth at bedtime.    [provider]    Physical Exam: Vitals:   12/27/21 1730 12/27/21 2012 12/27/21 2015 12/27/21 2136  BP: (!) 131/94  (!) 127/108 (!) 169/106  Pulse: 95  93 98  Resp:   20 17  Temp:  98.3 F (36.8 C)  98.3 F (36.8  C)  TempSrc:  Oral  Oral  SpO2: 98%  100% 100%  Weight:      Height:        Physical Exam Vitals and nursing note reviewed.  Constitutional:      General: She is not in acute distress.    Appearance: Normal appearance. She is obese. She is not ill-appearing, toxic-appearing or diaphoretic.  HENT:     Head: Normocephalic and atraumatic.     Nose: Nose normal. No rhinorrhea.  Eyes:     General: No scleral icterus. Cardiovascular:     Rate and Rhythm: Normal rate and regular rhythm.  Pulmonary:     Effort: Pulmonary effort is normal. No respiratory distress.     Breath sounds: Normal breath sounds. No wheezing or rales.  Abdominal:     General: Bowel sounds are normal.     Palpations: Abdomen is soft.     Tenderness: There is abdominal tenderness in the right lower quadrant. There is no guarding or rebound. Negative signs include Murphy's sign and McBurney's sign.    Musculoskeletal:     Comments: Prior left forearm AV graft  Skin:    General: Skin is warm and dry.     Capillary Refill: Capillary refill takes less than 2 seconds.  Neurological:     General: No focal deficit present.     Mental Status: She is alert and oriented to person, place, and time.     Labs on Admission: I have personally reviewed following labs and imaging studies  CBC: Recent Labs  Lab 12/27/21 1417  WBC 5.5  NEUTROABS 3.9  HGB 13.6  HCT 42.8  MCV 101.9*  PLT 627   Basic Metabolic Panel: Recent Labs  Lab 12/27/21 1417  NA 138  K 5.8*  CL 110  CO2 18*  GLUCOSE 62*  BUN 54*  CREATININE 5.99*  CALCIUM 10.3   GFR: Estimated Creatinine Clearance: 11.1 mL/min (A) (by C-G formula based on SCr of 5.99 mg/dL (H)). Liver Function Tests: Recent Labs  Lab 12/27/21 1417  AST 18  ALT 16  ALKPHOS 147*  BILITOT 0.7  PROT 8.5*  ALBUMIN 4.4   Recent Labs  Lab 12/27/21 1417  LIPASE 83*  No results for input(s): AMMONIA in the last 168 hours. Coagulation Profile: No results for  input(s): INR, PROTIME in the last 168 hours. Cardiac Enzymes: No results for input(s): CKTOTAL, CKMB, CKMBINDEX, TROPONINI, TROPONINIHS in the last 168 hours. BNP (last 3 results) No results for input(s): PROBNP in the last 8760 hours. HbA1C: No results for input(s): HGBA1C in the last 72 hours. CBG: Recent Labs  Lab 12/27/21 1712 12/27/21 2010 12/27/21 2058 12/27/21 2139  GLUCAP 69* 70 105* 110*   Lipid Profile: No results for input(s): CHOL, HDL, LDLCALC, TRIG, CHOLHDL, LDLDIRECT in the last 72 hours. Thyroid Function Tests: No results for input(s): TSH, T4TOTAL, FREET4, T3FREE, THYROIDAB in the last 72 hours. Anemia Panel: No results for input(s): VITAMINB12, FOLATE, FERRITIN, TIBC, IRON, RETICCTPCT in the last 72 hours. Urine analysis:    Component Value Date/Time   COLORURINE YELLOW 12/27/2021 1356   APPEARANCEUR CLEAR 12/27/2021 1356   LABSPEC 1.012 12/27/2021 1356   PHURINE 7.5 12/27/2021 1356   GLUCOSEU NEGATIVE 12/27/2021 1356   HGBUR TRACE (A) 12/27/2021 1356   BILIRUBINUR NEGATIVE 12/27/2021 1356   KETONESUR TRACE (A) 12/27/2021 1356   PROTEINUR >300 (A) 12/27/2021 1356   UROBILINOGEN 0.2 07/21/2021 1953   NITRITE NEGATIVE 12/27/2021 1356   LEUKOCYTESUR NEGATIVE 12/27/2021 1356    Radiological Exams on Admission: I have personally reviewed images CT ABDOMEN PELVIS WO CONTRAST  Result Date: 12/27/2021 CLINICAL DATA:  Right lower quadrant abdominal pain for 5 days currently on dialysis. History of CKD. EXAM: CT ABDOMEN AND PELVIS WITHOUT CONTRAST TECHNIQUE: Multidetector CT imaging of the abdomen and pelvis was performed following the standard protocol without IV contrast. RADIATION DOSE REDUCTION: This exam was performed according to the departmental dose-optimization program which includes automated exposure control, adjustment of the mA and/or kV according to patient size and/or use of iterative reconstruction technique. COMPARISON:  None Available. FINDINGS:  Lower chest: No acute abnormality. Hepatobiliary: No focal liver abnormality is seen. No gallstones, gallbladder wall thickening, or biliary dilatation. Pancreas: Unremarkable. No pancreatic ductal dilatation or surrounding inflammatory changes. Spleen: Normal in size without focal abnormality. Adrenals/Urinary Tract: Adrenal glands are unremarkable. Bilateral renal atrophy. No evidence of nephrolithiasis or hydronephrosis. No appreciable renal mass on this noncontrast enhanced examination. Bladder is unremarkable. Stomach/Bowel: Stomach is within normal limits. Appendix appears normal. No evidence of bowel wall thickening, distention, or inflammatory changes. Vascular/Lymphatic: No significant vascular findings are present. No enlarged abdominal or pelvic lymph nodes. Reproductive: Large pedunculated peripherally calcified degenerative fibroid measuring at least 9.1 x 6.4 x 14.5 cm. Fat containing paraumbilical hernia with mild fat stranding. Musculoskeletal: No acute or significant osseous findings. IMPRESSION: 1. Large pedunculated peripherally calcified fibroid measuring at least 9.1 x 6.4 x 14.5 cm. 2. Bowel loops are normal in caliber. Normal appendix. No evidence of colitis or diverticulitis. 3. Mild atrophy of bilateral kidneys consistent with history of CKD. No acute abnormality. 4. Fat containing periumbilical hernia with mild fat stranding. Correlate with localized pain. Electronically Signed   By: Keane Police D.O.   On: 12/27/2021 14:58    EKG: My personal interpretation of EKG shows: NSR    Assessment/Plan Principal Problem:   Hyperkalemia Active Problems:   End stage renal disease (HCC)   History of difficult venous access   HIV (human immunodeficiency virus infection) (HCC)   Class 1 obesity due to excess calories with serious comorbidity and body mass index (BMI) of 31.0 to 31.9 in adult   Hypertension   Uterine fibroid  Assessment and Plan: * Hyperkalemia Hyperkalemia is a  Acute illness/condition that poses a threat to life or bodily function. Admit to med telemetry bed. Pt given 10 mg lokelma in ER. Repeat BMP in AM. Due to pt not having received HD since March 2023.  History of difficult venous access Chronic. Patient with a history of difficult peripheral IV access.  She states that the only place that they can get blood is through her hand veins.  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  Explained to her that most commonly AV fistulas/grafts are placed in the upper extremities.  Then HD catheter placed in the internal jugular veins.  Then in the groin veins.  And then lastly in the lumbar area.  End stage renal disease (HCC) Unstable. Will need to reinitiate HD. Pt with poor venous access. [place] from 10/2021 shows "There is incomplete compressibility of the visualized portion of the jugular vein with peripheral clot or scarring, possibly related to catheter placement."  May need addition vascular imaging to determine best location for HD catheter. Pt states in September 2022 she was going to have peritoneal dialysis catheter placed.  At the time of her surgery, surgery noted that the patient had a large uterine fibroid.  She states that she is ready seen GYN.  They want to perform a hysterectomy on her but are waiting her blood work to come back improved so they can proceed with surgery.  Patient may need GYN consult while she is here so that this can be planned.  She states that she sees Dr. Christophe Louis with [facility].  Uterine fibroid Stable.  Currently being evaluated for hysterectomy by [facility].  Patient continues to complain of pain.  CT abdomen pelvis negative for acute intra-abdominal pathology.  Continue as needed Norco 5/325.  Hypertension Uncontrolled.  Patient with no IV access.  Continue with clonidine 0.1 mg every 4 hours as needed for hypertension.  Continue with p.o. Norvasc 10 mg daily.  Class 1 obesity due to excess calories  with serious comorbidity and body mass index (BMI) of 31.0 to 31.9 in adult Stable.  HIV (human immunodeficiency virus infection) ([place]) Stable.  Continue Biktarvy 50/200/25 daily.   DVT prophylaxis: SQ Heparin Code Status: Full Code Family Communication: no family at bedside  Disposition Plan: return home  Consults called: none. Will need nephrology consult in AM  Admission status: Observation, Telemetry bed   Kristopher Oppenheim, DO [facility] 12/27/2021, 10:31 PM

## 2021-12-28 ENCOUNTER — Encounter: Payer: Self-pay | Admitting: Infectious Diseases

## 2021-12-28 ENCOUNTER — Other Ambulatory Visit (HOSPITAL_COMMUNITY): Payer: Self-pay

## 2021-12-28 DIAGNOSIS — E872 Acidosis, unspecified: Secondary | ICD-10-CM

## 2021-12-28 DIAGNOSIS — E6609 Other obesity due to excess calories: Secondary | ICD-10-CM | POA: Diagnosis not present

## 2021-12-28 DIAGNOSIS — E875 Hyperkalemia: Secondary | ICD-10-CM | POA: Diagnosis not present

## 2021-12-28 DIAGNOSIS — Z87898 Personal history of other specified conditions: Secondary | ICD-10-CM | POA: Diagnosis not present

## 2021-12-28 DIAGNOSIS — N186 End stage renal disease: Secondary | ICD-10-CM | POA: Diagnosis not present

## 2021-12-28 LAB — URINE CULTURE: Culture: <10000 — AB

## 2021-12-28 LAB — COMPREHENSIVE METABOLIC PANEL
ALT: 17 U/L (ref 0–44)
AST: 18 U/L (ref 15–41)
Albumin: 3.2 g/dL — ABNORMAL LOW (ref 3.5–5.0)
Alkaline Phosphatase: 127 U/L — ABNORMAL HIGH (ref 38–126)
Anion gap: 8 (ref 5–15)
BUN: 49 mg/dL — ABNORMAL HIGH (ref 6–20)
CO2: 16 mmol/L — ABNORMAL LOW (ref 22–32)
Calcium: 9.1 mg/dL (ref 8.9–10.3)
Chloride: 111 mmol/L (ref 98–111)
Creatinine, Ser: 5.61 mg/dL — ABNORMAL HIGH (ref 0.44–1.00)
GFR, Estimated: 9 mL/min — ABNORMAL LOW (ref >60)
Glucose, Bld: 113 mg/dL — ABNORMAL HIGH (ref 70–99)
Potassium: 4.3 mmol/L (ref 3.5–5.1)
Sodium: 135 mmol/L (ref 135–145)
Total Bilirubin: 0.5 mg/dL (ref 0.3–1.2)
Total Protein: 6.7 g/dL (ref 6.5–8.1)

## 2021-12-28 LAB — MAGNESIUM: Magnesium: 2.2 mg/dL (ref 1.7–2.4)

## 2021-12-28 LAB — PHOSPHORUS: Phosphorus: 4.2 mg/dL (ref 2.5–4.6)

## 2021-12-28 MED ORDER — HYDRALAZINE HCL 25 MG PO TABS
25.0000 mg | ORAL_TABLET | Freq: Two times a day (BID) | ORAL | 1 refills | Status: DC
  Filled 2021-12-28: qty 60, 30d supply, fill #0

## 2021-12-28 MED ORDER — AMLODIPINE BESYLATE 10 MG PO TABS
10.0000 mg | ORAL_TABLET | Freq: Every day | ORAL | 1 refills | Status: AC
  Filled 2021-12-28: qty 30, 30d supply, fill #0

## 2021-12-28 MED ORDER — SENNOSIDES-DOCUSATE SODIUM 8.6-50 MG PO TABS
1.0000 | ORAL_TABLET | Freq: Two times a day (BID) | ORAL | Status: DC
  Administered 2021-12-28: 1 via ORAL
  Filled 2021-12-28: qty 1

## 2021-12-28 MED ORDER — POLYETHYLENE GLYCOL 3350 17 G PO PACK: 17.0000 g | PACK | Freq: Two times a day (BID) | ORAL | Status: DC

## 2021-12-28 MED ORDER — POLYETHYLENE GLYCOL 3350 17 GM/SCOOP PO POWD
17.0000 g | Freq: Two times a day (BID) | ORAL | 1 refills | Status: DC
  Filled 2021-12-28: qty 238, 7d supply, fill #0

## 2021-12-28 MED ORDER — SODIUM BICARBONATE 650 MG PO TABS
650.0000 mg | ORAL_TABLET | Freq: Two times a day (BID) | ORAL | 1 refills | Status: AC
  Filled 2021-12-28: qty 60, 30d supply, fill #0

## 2021-12-28 MED ORDER — SENNOSIDES-DOCUSATE SODIUM 8.6-50 MG PO TABS: 1.0000 | ORAL_TABLET | Freq: Two times a day (BID) | ORAL | Status: DC

## 2021-12-28 MED ORDER — SODIUM BICARBONATE 650 MG PO TABS
650.0000 mg | ORAL_TABLET | Freq: Two times a day (BID) | ORAL | Status: DC
  Administered 2021-12-28: 650 mg via ORAL
  Filled 2021-12-28: qty 1

## 2021-12-28 NOTE — Consult Note (Addendum)
[facility] Renal Consultation Note    Indication for Consultation:  Management of ESRD/hemodialysis; anemia, hypertension/volume and secondary hyperparathyroidism  GEX:[id number], Provider Not In  HPI: Brooke Baldwin is a 46 y.o. female with ESRD on dialysis since December 2021.  HIstorically patient and has been non complaint and last dialysis was on 10/18/21.  Past medical history significant for HIV, FSGS, trigeminal neuralgia, uterine fibroids and hypertension.   Patient presented to the ED due to lower abdominal pain that had been worsening over the last week.  Admits to constipation and decreased appetite since the pain began.  Denies fever, chills, flank pain, n/v/d, SOB, CP, weakness, dizziness and fatigue.    Patient was admitted to the hospital in March due to infection around her [facility].  She left prior to have a new access placed.  She denies uremic symptoms, SOB and edema while not having dialysis for 2 months.  Reports urinating large amounts every 2-3 hours.  Pertinant findings during work up include K 5.8>4.3, CO2 18>16, BUN 54>49, Scr 5.61, GFR 9, Ca 10.3>9.1 and alb 3.2.  Patient admitted to observation due to hyperkalemia.  She tells me that she has missed before one month at a time times 2 but this is the longest she has gone-  she feels well-  working full time-  did not come to hospital for uremic sxms-  thinks her belly pain was gas.   Past Medical History:  Diagnosis Date   Anemia associated with chronic renal failure    Atrophic kidney    bilateral   CIN I (cervical intraepithelial neoplasia I)    ESRD (end stage renal disease) on dialysis ([place]) 05/2020   hemodialysis  T/ Th/ Sat  @ [facility] ([address] in [place], ( 10-17-2020  s/p left AVG 11/ 2021 unable to use as of yet, currently using right IJ tunneled cath (right side of upper chest) for dialysis   FSGS (focal segmental glomerulosclerosis)    renal biopsy 04-15-2020 in epic , secondary to HTN/ HIV/  Obese   Heart palpitations 11/ 2021  (10-17-2020  pt stated the palpitations does not bother her and has no other symptoms , stated they started 11/ 2021)   pt was been seen by cardiology, dr s. Patria Mane ([facility] in [place]),  per pt had event monitor for 17 days , was told normal with  no arrhythmias,  and stated echo is scheduled for 10-28-2020   History of vulvar dysplasia    HIV (human immunodeficiency virus infection) ([place])    followed by ID-- dr a. wallace   Hypertension    S/P arteriovenous (AV) fistula creation 07-04-2020 [facility]    left forearm,  07-28-2020  angioplasty of thrombosis   Secondary hyperparathyroidism of renal origin ([place])    Tachycardia    Thrombocytopenia ([place]) 07/01/2020   Trigeminal neuralgia    ride side ---  neurologist--- dr Krista Blue   (10-17-2020  pt stated last episode about 4 months ago was mild)   VIN II (vulvar intraepithelial neoplasia II)    Past Surgical History:  Procedure Laterality Date   A/V FISTULAGRAM Left 07/28/2020   Procedure: A/V FISTULAGRAM;  Surgeon: Marty Heck, MD;  Location: [facility];  Service: Cardiovascular;  Laterality: Left;   AV FISTULA PLACEMENT Left 07/04/2020   Procedure: INSERTION OF LEFT ARTERIOVENOUS (AV) GORE-TEX GRAFT ARM (ACUSEAL);  Surgeon: Cherre Robins, MD;  Location: [facility] OR;  Service: Vascular;  Laterality: Left;   CERVICAL BIOPSY  W/ LOOP  ELECTRODE EXCISION  9678   CO2 LASER APPLICATION N/A 9/38/1017   Procedure: CO2 LASER APPLICATION;  Surgeon: Everitt Amber, MD;  Location: [facility];  Service: Gynecology;  Laterality: N/A;   INSERTION OF DIALYSIS CATHETER N/A 08/14/2021   Procedure: TUNNELED DIALYSIS CATHETER EXCHANGE;  Surgeon: Angelia Mould, MD;  Location: [facility];  Service: Vascular;  Laterality: N/A;   ORIF ANKLE FRACTURE Right 2012   per pt retained hardware   PERIPHERAL VASCULAR BALLOON ANGIOPLASTY Left 07/28/2020   Procedure: PERIPHERAL VASCULAR BALLOON ANGIOPLASTY;   Surgeon: Marty Heck, MD;  Location: [facility];  Service: Cardiovascular;  Laterality: Left;  AVF   VULVA SURGERY  2013   excision   Family History  Problem Relation Age of Onset   Diabetes Mother    Healthy Father    Breast cancer Maternal Aunt    Pancreatic cancer Maternal Grandfather    Colon cancer Neg Hx    Colon polyps Neg Hx    Esophageal cancer Neg Hx    Rectal cancer Neg Hx    Stomach cancer Neg Hx    Prostate cancer Neg Hx    Endometrial cancer Neg Hx    Ovarian cancer Neg Hx    Social History:  reports that she has never smoked. She has never used smokeless tobacco. She reports that she does not currently use alcohol. She reports that she does not currently use drugs. Allergies  Allergen Reactions   Lioresal [Baclofen] Other (See Comments)    Intolerable dizziness   Neurontin [Gabapentin] Other (See Comments)    Intolerable dizziness   Penicillins Other (See Comments)    Childhood allergy Unknown reaction    Prior to Admission medications   Medication Sig Start Date End Date Taking? Authorizing Provider  bictegravir-emtricitabine-tenofovir AF (BIKTARVY) 50-200-25 MG TABS tablet Take 1 tablet by mouth at bedtime. 10/12/21 10/07/22 Yes Mignon Pine, DO  ferric citrate (AURYXIA) 1 GM 210 MG(Fe) tablet Take 210-420 mg by mouth 3 (three) times daily with meals.   Yes [provider]  multivitamin (RENA-VIT) TABS tablet Take 1 tablet by mouth at bedtime.   Yes [provider]  oxyCODONE-acetaminophen (PERCOCET/ROXICET) 5-325 MG tablet Take 1 tablet by mouth every 4 (four) hours as needed for pain. 11/21/21  Yes [provider]   Current Facility-Administered Medications  Medication Dose Route Frequency Provider Last Rate Last Admin   bictegravir-emtricitabine-tenofovir AF (BIKTARVY) 50-200-25 MG per tablet 1 tablet  1 tablet Oral QHS Kristopher Oppenheim, DO       cloNIDine (CATAPRES) tablet 0.1 mg  0.1 mg Oral Q4H PRN Kristopher Oppenheim, DO        heparin injection 5,000 Units  5,000 Units Subcutaneous Q8H Kristopher Oppenheim, DO   5,000 Units at 12/28/21 0559   HYDROcodone-acetaminophen (NORCO/VICODIN) 5-325 MG per tablet 1-2 tablet  1-2 tablet Oral Q4H PRN Kristopher Oppenheim, DO   2 tablet at 12/28/21 0558   sodium bicarbonate tablet 650 mg  650 mg Oral BID [place], [place], [place]   650 mg at 12/28/21 5102   Labs: Basic Metabolic Panel: Recent Labs  Lab 12/27/21 1417 12/28/21 0102  NA 138 135  K 5.8* 4.3  CL 110 111  CO2 18* 16*  GLUCOSE 62* 113*  BUN 54* 49*  CREATININE 5.99* 5.61*  CALCIUM 10.3 9.1   Liver Function Tests: Recent Labs  Lab 12/27/21 1417 12/28/21 0102  AST 18 18  ALT 16 17  ALKPHOS 147* 127*  BILITOT 0.7  0.5  PROT 8.5* 6.7  ALBUMIN 4.4 3.2*   Recent Labs  Lab 12/27/21 1417  LIPASE 83*   CBC: Recent Labs  Lab 12/27/21 1417  WBC 5.5  NEUTROABS 3.9  HGB 13.6  HCT 42.8  MCV 101.9*  PLT 274   CBG: Recent Labs  Lab 12/27/21 1712 12/27/21 2010 12/27/21 2058 12/27/21 2139  GLUCAP 69* 70 105* 110*   Studies/Results: CT ABDOMEN PELVIS WO CONTRAST  Result Date: 12/27/2021 CLINICAL DATA:  Right lower quadrant abdominal pain for 5 days currently on dialysis. History of CKD. EXAM: CT ABDOMEN AND PELVIS WITHOUT CONTRAST TECHNIQUE: Multidetector CT imaging of the abdomen and pelvis was performed following the standard protocol without IV contrast. RADIATION DOSE REDUCTION: This exam was performed according to the departmental dose-optimization program which includes automated exposure control, adjustment of the mA and/or kV according to patient size and/or use of iterative reconstruction technique. COMPARISON:  None Available. FINDINGS: Lower chest: No acute abnormality. Hepatobiliary: No focal liver abnormality is seen. No gallstones, gallbladder wall thickening, or biliary dilatation. Pancreas: Unremarkable. No pancreatic ductal dilatation or surrounding inflammatory changes. Spleen: Normal in size without  focal abnormality. Adrenals/Urinary Tract: Adrenal glands are unremarkable. Bilateral renal atrophy. No evidence of nephrolithiasis or hydronephrosis. No appreciable renal mass on this noncontrast enhanced examination. Bladder is unremarkable. Stomach/Bowel: Stomach is within normal limits. Appendix appears normal. No evidence of bowel wall thickening, distention, or inflammatory changes. Vascular/Lymphatic: No significant vascular findings are present. No enlarged abdominal or pelvic lymph nodes. Reproductive: Large pedunculated peripherally calcified degenerative fibroid measuring at least 9.1 x 6.4 x 14.5 cm. Fat containing paraumbilical hernia with mild fat stranding. Musculoskeletal: No acute or significant osseous findings. IMPRESSION: 1. Large pedunculated peripherally calcified fibroid measuring at least 9.1 x 6.4 x 14.5 cm. 2. Bowel loops are normal in caliber. Normal appendix. No evidence of colitis or diverticulitis. 3. Mild atrophy of bilateral kidneys consistent with history of CKD. No acute abnormality. 4. Fat containing periumbilical hernia with mild fat stranding. Correlate with localized pain. Electronically Signed   By: Keane Police D.O.   On: 12/27/2021 14:58    ROS: All others negative except those listed in HPI.   Physical Exam: Vitals:   12/27/21 2015 12/27/21 2136 12/28/21 0500 12/28/21 0554  BP: (!) 127/108 (!) 169/106  117/87  Pulse: 93 98  82  Resp: '20 17  16  '$ Temp:  98.3 F (36.8 C)  98.1 F (36.7 C)  TempSrc:  Oral    SpO2: 100% 100%  98%  Weight:   80.3 kg   Height:         General: well appearing female in NAD Head: NCAT sclera not icteric MMM Neck: Supple. No lymphadenopathy Lungs: mostly CTAB, +scattered wheeze L>R, no crackles, nml WOB on RA Heart: RRR. No murmur, rubs or gallops.  Abdomen: soft, nontender, +BS, no guarding, no rebound tenderness M/S:  Equal strength b/l in upper and lower extremities.  Lower extremities:no edema, ischemic changes, or open  wounds  Neuro: AAOx3. Moves all extremities spontaneously. Psych:  Responds to questions appropriately with a normal affect. Dialysis Access: none  Dialysis Orders:  MWF - [facility] - last outpatient HD 10/18/21  4.25 hrs, BFR 300, DFR AF 1.5,  EDW 82kg, 2K/ 2Ca  Access: TDC  Heparin 3000 Calcitriol 32mg PO qHD  Assessment/Plan:  Abdominal pain - likely 2/2 uterine fibroid.  CT abd/pelvis negative for acute intra-abdominal Improved today.  Hyperkalemia - resolved with lokelma x1. K 4.3.  ESRD/CKD stage 5-  Labs relatively stable considering patient has not had dialysis in 2 months.  K elevated on admission but improved to goal with lokelma x1.  BUN mildly elevated and GFR around 8-9.  Patient is not volume overloaded and denies overt uremic symptoms.  Reports robust urine output on a daily basis.  She currently does not have a functioning dialysis access and refuses AVG/AVF placement due to numbness in L arm following last permanent access.  Appears to have enough residual kidney function to try to continue without dialysis at this time with agreement to be closely followed in office.  Ideally she wanted to opt for PD due to the issue she had with perm access and she wants to keep working-  she even got to the point of having a PD cath placed but procedure was aborted due to a large fibroid tumor.  She has seen OB/GYN-  is slated to have a hysterectomy if they get clearance from [place] which we will give.  Then if she gets closer to RRT as and OP we can just proceed with PD cath placement  Hold dialysis with follow up in office in 1-2 weeks. Discussed importance of compliance with office visits.  Reminded of signs/symptoms of uremia and volume overload and advised patient to contact office if any of these develop. Start sodium bicarbonate '650mg'$  BID to help acidosis and to control potassium . Discussed if she continues to have hyperkalemia will need to have [place] placed and restart dialysis. RD consult to  discuss Renal diet, specific focus on potassium and phosphorus.   Hypertension/volume  - Blood pressure now in goal.  Started on amlodipine '10mg'$ , hydralazine '50mg'$  and prn clonidine. Does not appear volume overload.    Anemia of CKD - Hgb 13.6.  No indication for ESA or iron.   Secondary Hyperparathyroidism -  Ca, Mg and phos in goal without binders or Vitamin D supplements.  Last outpatient pth high at 771.  Will recheck and likely need to restart calcitriol at home.   Nutrition - Discussed importance of adhering to renal diet with fluid restrictions.  RD consulted for further education about low K and low phos diet.  HIV - meds per PMD  Jen Mow, PA-C [facility] 12/28/2021, 9:44 AM   Patient seen and examined, agree with above note with above modifications. As above she definitely has residual renal function as labs are not THAT abnormal after 2 months of no dialysis-  and she really does not seem uremic- we are going to try and follow this as an OP with frequent labs- she can then get her hysterectomy  and possibly be ready for PD in the future when dialysis might be needed  Corliss Parish, MD 12/28/2021

## 2021-12-28 NOTE — Plan of Care (Signed)

## 2021-12-28 NOTE — Discharge Summary (Signed)
Physician Discharge Summary  Brooke Baldwin RSW:[id number] DOB: Sep 05, 1975 DOA: 12/27/2021  PCP: System, Provider Not In  Admit date: 12/27/2021 Discharge date: 12/28/2021  Time spent: 55 minutes  Recommendations for Outpatient Follow-up:  Follow-up with PCP in 2 weeks.  Follow-up on patient's blood pressure. Follow-up with Dr. Moshe Cipro, nephrology in 1 to 2 weeks.   Discharge Diagnoses:  Principal Problem:   Hyperkalemia Active Problems:   End stage renal disease (HCC)   History of difficult venous access   HIV (human immunodeficiency virus infection) (HCC)   Class 1 obesity due to excess calories with serious comorbidity and body mass index (BMI) of 31.0 to 31.9 in adult   Hypertension   Uterine fibroid   Metabolic acidosis   Discharge Condition: Stable and improved.  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  Patient has been recently seen by GYN.  She was told that she has a uterine fibroid that needs to be removed.  Patient relates that back in September 2022 when she was getting ready for a peritoneal dialysis catheter placement, the surgeon noted intraoperatively that she had a very large uterine fibroid that prevented her from getting a peritoneal catheter.   She subsequently ended up getting a hemodialysis catheter infection and was hospitalized in March 2023.  She states that she had a very difficult time being in the hospital.  She ultimately ended up having the dialysis catheter removed and was placed on a line holiday at that time.  She states that she got very frustrated being in the hospital and felt that the providers were not listening to her.  She ultimately was discharged to home without a  hemodialysis catheter.  Patient states that she originally moved down here to [facility] in 2021 as a job transfer for the [employer].  She tried to get transferred back to [place] where her family is from but has been unable to initiate transfer within the [employer].  When she was discharged in March 2023, she returned to [place] and saw a nephrologist in [place] who warned her that she need to restart dialysis.  Its been well over  2 months month since she had any dialysis.  She continues to urinate.   She returned to [place] [employer] after only spending a week in [place].  She continues to work at the [employer].   In the ER, CT abdomen pelvis was negative for any acute intra-abdominal pathology.  They did note her large uterine fibroid measuring 9.1 x 6.4 x 14.5 cm   Patient also noted to have hyperkalemia with a potassium of 5.8.  She was treated with 10 mg of p.o. Lokelma.   Patient transferred to [facility] for further care.    ED Course: CT abdomen pelvis negative for acute intra-abdominal pathology.  Patient had hyperkalemia with potassium 5.8.  She was treated with 10 mg of p.o. Lokelma.  Hospital Course:   Assessment and Plan: * Hyperkalemia Hyperkalemia is a Acute illness/condition that poses a threat to life or bodily function. -Patient admitted to telemetry, received 10 mg of leg, in the ED. -Repeat labs in the morning with resolution of hypokalemia with potassium of 4.3. -Patient also started on  bicarb tablets per nephrology. -Per nephrology no need for emergent hemodialysis during this hospitalization and recommended close outpatient follow-up.  History of difficult venous access Chronic. Patient with a history of difficult peripheral IV access.  She states that the only place that they can get blood is through her hand veins.  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  Explained to her that most commonly AV  fistulas/grafts are placed in the upper extremities.  Then HD catheter placed in the internal jugular veins.  Then in the groin veins.  And then lastly in the lumbar area. Patient remained stable.  No need for hemodialysis at this time and as such access to be deferred to the outpatient setting when and if needed.  End stage renal disease ([place]) On presentation felt patient will need to be reinitiated with hemodialysis.  Pt with poor venous access. [place] from 10/2021 shows "There is incomplete compressibility of the visualized portion of the jugular vein with peripheral clot or scarring, possibly related to catheter placement."  May need addition vascular imaging to determine best location for HD catheter. Pt states in September 2022 she was going to have peritoneal dialysis catheter placed.  At the time of her surgery, surgery noted that the patient had a large uterine fibroid.  She states that she is ready seen GYN.  They want to perform a hysterectomy on her but are waiting her blood work to come back improved so they can proceed with surgery.  Patient assessed by nephrology during this hospitalization who reviewed lab work and feel that patient's labs are not that abnormal after 2 months of no hemodialysis, felt patient was not uremic patient started on bicarb tablets and recommended close outpatient follow-up with frequent labs and further management.  Patient was discharged home on bicarb tablets twice daily with close outpatient follow-up with nephrology.  Uterine fibroid Stable.  Currently being evaluated for hysterectomy by [facility] GYN.  Patient continues to complain of pain.  CT abdomen pelvis negative for acute intra-abdominal pathology.  Patient had some improvement with abdominal pain after bowel movement.  Patient seen by nephrology who does not feel patient needs emergent hemodialysis at this time recommending close outpatient follow-up with repeat labs and determination as to when patient may  be able to get hysterectomy.  -Outpatient follow-up with GYN.    Hypertension Uncontrolled on admission.  Patient received 10 mg Norvasc, and also 50 of hydralazine with improvement with blood pressure.,  Norvasc 10 mg daily as well as hydralazine 25 mg twice daily.  Outpatient follow-up with PCP.  Class 1 obesity due to excess calories with serious comorbidity and body mass index (BMI) of 31.0 to 31.9 in adult Lifestyle modification. Outpatient follow-up with PCP  HIV (human immunodeficiency virus infection) ([place]) Stable.  Patient maintained on home regimen patient Biktarvy 50/200/25 daily.  Metabolic acidosis - Secondary to chronic kidney disease. -Patient started on bicarb tablets per nephrology. -Patient with discharge on bicarb tablets.        Procedures: CT abdomen and pelvis 12/27/2021   Consultations: Nephrology: Dr. Moshe Cipro 12/28/2021  Discharge Exam: Vitals:   12/28/21 0554 12/28/21 1100  BP: 117/87 (!) 138/106  Pulse: 82 80  Resp: 16 17  Temp: 98.1 F (36.7 C) 97.8 F (36.6 C)  SpO2: 98% 100%    General: NAD Cardiovascular: RRR no murmurs rubs or gallops.  No JVD.  No lower extremity edema. Respiratory: CTAB.  No wheezes, no crackles, no rhonchi.  Normal respiratory effort.   Discharge Instructions   Discharge Instructions     Diet - low sodium heart healthy   Complete by: As directed    Increase activity slowly   Complete by: As directed       Allergies as of 12/28/2021       Reactions   Lioresal [baclofen] Other (See Comments)   Intolerable dizziness   Neurontin [gabapentin] Other (See Comments)   Intolerable dizziness   Penicillins Other (See Comments)   Childhood allergy Unknown reaction        Medication List     TAKE these medications    amLODipine 10 MG tablet Commonly known as: NORVASC Take 1 tablet (10 mg total) by mouth daily.   Biktarvy 50-200-25 MG Tabs tablet Generic drug: bictegravir-emtricitabine-tenofovir  AF Take 1 tablet by mouth at bedtime.   ferric citrate 1 GM 210 MG(Fe) tablet Commonly known as: AURYXIA Take 210-420 mg by mouth 3 (three) times daily with meals.   hydrALAZINE 25 MG tablet Commonly known as: APRESOLINE Take 1 tablet (25 mg total) by mouth 2 (two) times daily.   multivitamin Tabs tablet Take 1 tablet by mouth at bedtime.   oxyCODONE-acetaminophen 5-325 MG tablet Commonly known as: PERCOCET/ROXICET Take 1 tablet by mouth every 4 (four) hours as needed for pain.   polyethylene glycol powder 17 GM/SCOOP powder Commonly known as: GLYCOLAX/MIRALAX Take 1 capful with water (17 g) by mouth 2 (two) times daily.   senna-docusate 8.6-50 MG tablet Commonly known as: Senokot-S Take 1 tablet by mouth 2 (two) times daily.   sodium bicarbonate 650 MG tablet Take 1 tablet (650 mg total) by mouth 2 (two) times daily.       Allergies  Allergen Reactions   Lioresal [Baclofen] Other (See Comments)    Intolerable dizziness   Neurontin [Gabapentin] Other (See Comments)    Intolerable dizziness   Penicillins Other (See Comments)    Childhood allergy Unknown reaction     Follow-up Information     Corliss Parish, MD. Schedule an appointment as soon as possible for a visit in 2 week(s).   Specialty: Nephrology Why: Follow-up in 1 to 2 weeks. Contact information: [place] [address] [phone]         PCP. Schedule an appointment as soon as possible for a visit in 2 week(s).                   The results of significant diagnostics from this hospitalization (including imaging, microbiology, ancillary and laboratory) are listed below for reference.    Significant Diagnostic Studies: CT ABDOMEN PELVIS WO CONTRAST  Result Date: 12/27/2021 CLINICAL DATA:  Right lower quadrant abdominal pain for 5 days currently on dialysis. History of CKD. EXAM: CT ABDOMEN AND PELVIS WITHOUT CONTRAST TECHNIQUE: Multidetector CT imaging of the abdomen and  pelvis was performed following the standard protocol without IV contrast. RADIATION DOSE REDUCTION: This exam was performed according to the departmental dose-optimization program which includes automated exposure control, adjustment of the mA and/or kV according to patient size and/or use of iterative reconstruction technique. COMPARISON:  None Available. FINDINGS: Lower chest: No acute abnormality. Hepatobiliary: No focal liver abnormality is seen. No gallstones, gallbladder wall thickening, or biliary dilatation. Pancreas: Unremarkable. No pancreatic ductal dilatation or surrounding inflammatory changes. Spleen: Normal in size without focal abnormality. Adrenals/Urinary Tract: Adrenal glands are unremarkable. Bilateral renal atrophy. No evidence of nephrolithiasis or hydronephrosis. No appreciable renal mass on this noncontrast enhanced examination. Bladder  is unremarkable. Stomach/Bowel: Stomach is within normal limits. Appendix appears normal. No evidence of bowel wall thickening, distention, or inflammatory changes. Vascular/Lymphatic: No significant vascular findings are present. No enlarged abdominal or pelvic lymph nodes. Reproductive: Large pedunculated peripherally calcified degenerative fibroid measuring at least 9.1 x 6.4 x 14.5 cm. Fat containing paraumbilical hernia with mild fat stranding. Musculoskeletal: No acute or significant osseous findings. IMPRESSION: 1. Large pedunculated peripherally calcified fibroid measuring at least 9.1 x 6.4 x 14.5 cm. 2. Bowel loops are normal in caliber. Normal appendix. No evidence of colitis or diverticulitis. 3. Mild atrophy of bilateral kidneys consistent with history of CKD. No acute abnormality. 4. Fat containing periumbilical hernia with mild fat stranding. Correlate with localized pain. Electronically Signed   By: Keane Police D.O.   On: 12/27/2021 14:58    Microbiology: Recent Results (from the past 240 hour(s))  Urine Culture     Status: Abnormal    Collection Time: 12/27/21  1:56 PM   Specimen: Urine, Clean Catch  Result Value Ref Range Status   Specimen Description   Final    URINE, CLEAN CATCH Performed at [facility], [address] [place], [place] [address]    Special Requests   Final    NONE Performed at [facility], [address] [place], [place] [address]    Culture (A)  Final    <10,000 COLONIES/mL INSIGNIFICANT GROWTH Performed at [facility] [address], [place], [place] [address]    Report Status 12/28/2021 FINAL  Final     Labs: Basic Metabolic Panel: Recent Labs  Lab 12/27/21 1417 12/28/21 0102  NA 138 135  K 5.8* 4.3  CL 110 111  CO2 18* 16*  GLUCOSE 62* 113*  BUN 54* 49*  CREATININE 5.99* 5.61*  CALCIUM 10.3 9.1  MG  --  2.2  PHOS  --  4.2   Liver Function Tests: Recent Labs  Lab 12/27/21 1417 12/28/21 0102  AST 18 18  ALT 16 17  ALKPHOS 147* 127*  BILITOT 0.7 0.5  PROT 8.5* 6.7  ALBUMIN 4.4 3.2*   Recent Labs  Lab 12/27/21 1417  LIPASE 83*   No results for input(s): AMMONIA in the last 168 hours. CBC: Recent Labs  Lab 12/27/21 1417  WBC 5.5  NEUTROABS 3.9  HGB 13.6  HCT 42.8  MCV 101.9*  PLT 274   Cardiac Enzymes: No results for input(s): CKTOTAL, CKMB, CKMBINDEX, TROPONINI in the last 168 hours. BNP: BNP (last 3 results) Recent Labs    08/12/21 1350  BNP 7.5    ProBNP (last 3 results) No results for input(s): PROBNP in the last 8760 hours.  CBG: Recent Labs  Lab 12/27/21 1712 12/27/21 2010 12/27/21 2058 12/27/21 2139  GLUCAP 69* 70 105* 110*       Signed:  Irine Seal MD.  [facility] 12/28/2021, 2:56 PM

## 2021-12-28 NOTE — Assessment & Plan Note (Signed)
-   Secondary to chronic kidney disease. -Patient started on bicarb tablets per nephrology. -Patient with discharge on bicarb tablets.

## 2021-12-29 LAB — PARATHYROID HORMONE, INTACT (NO CA): PTH: 206 pg/mL — ABNORMAL HIGH (ref 15–65)

## 2022-04-03 IMAGING — CR DG NECK SOFT TISSUE
2 series · 2 of 2 positions shown · non-contrast
Comparison: None.

CLINICAL DATA: Evaluate possible foreign body in neck.

EXAM:
NECK SOFT TISSUES - 1+ VIEW

[neck lat]
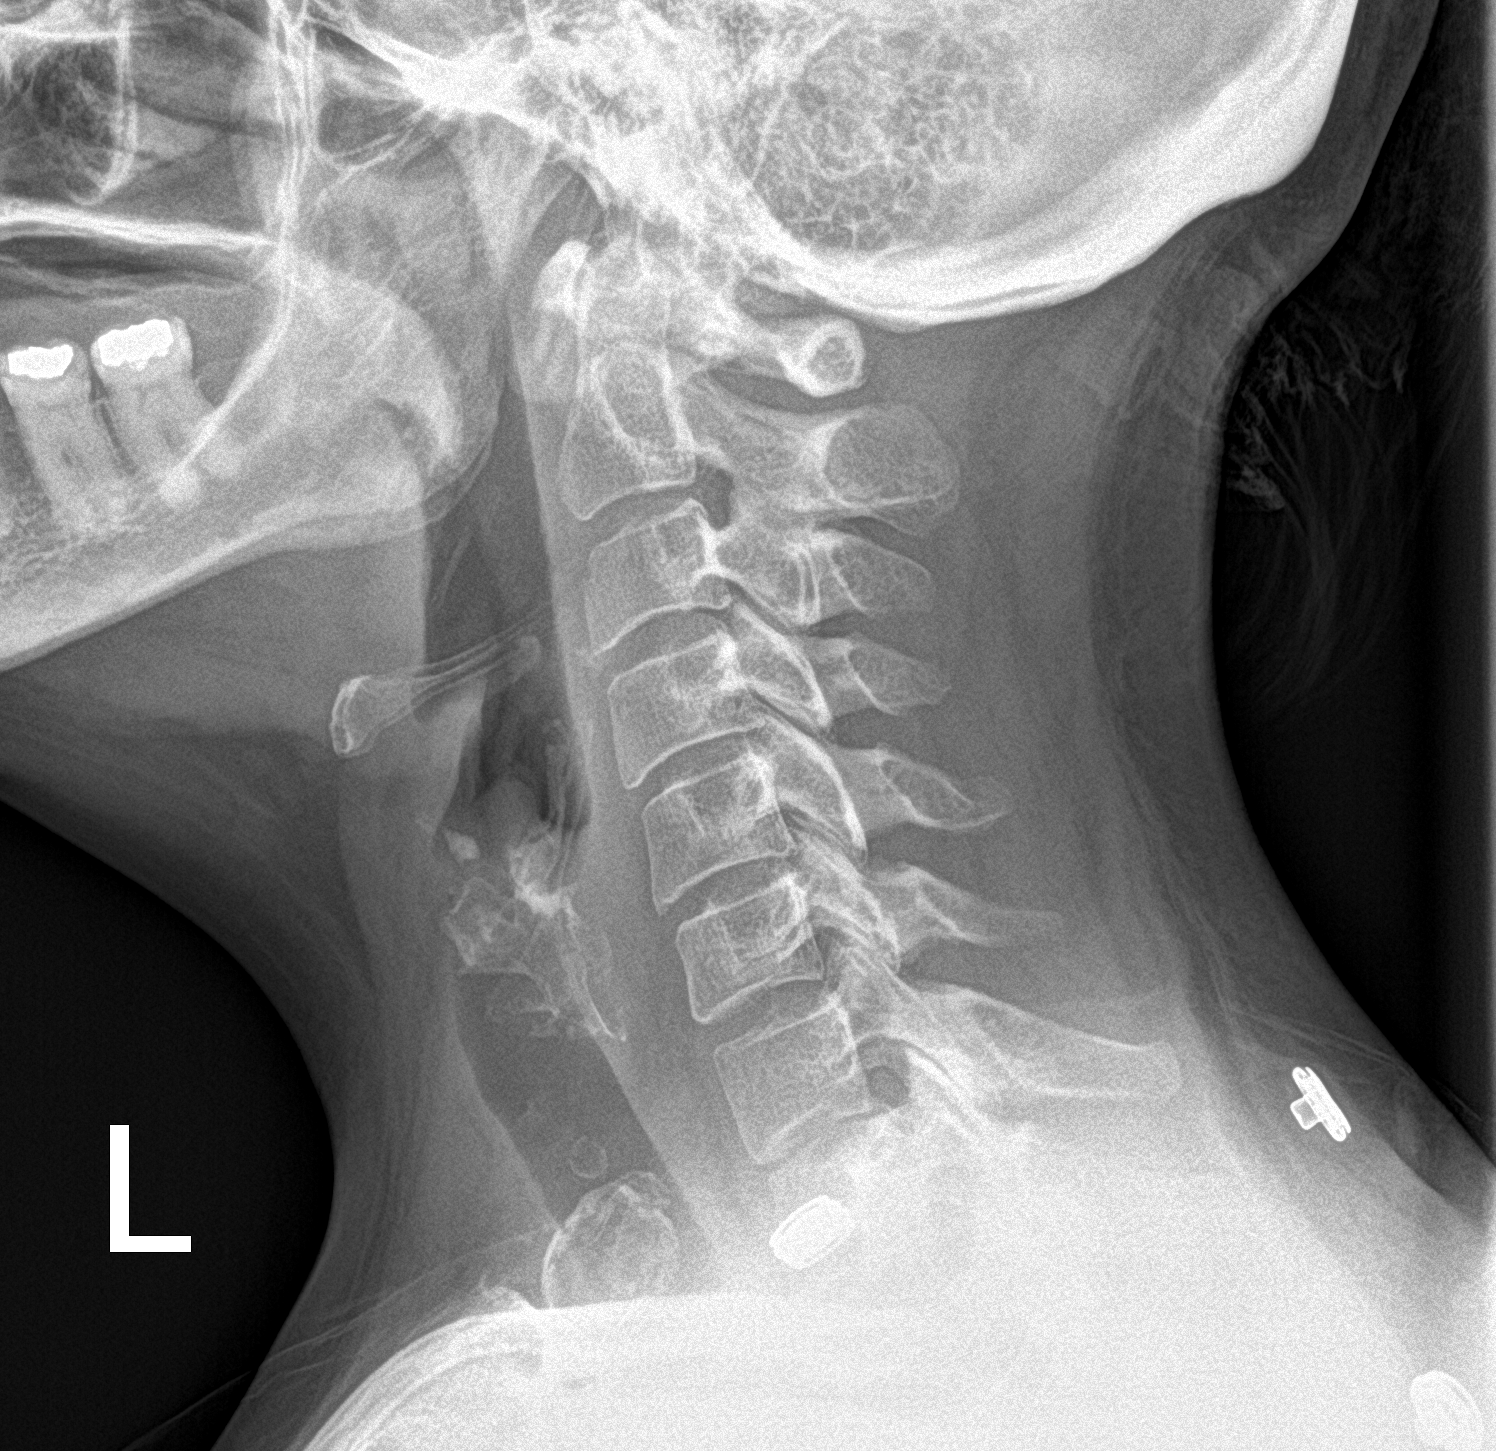

[neck ap]
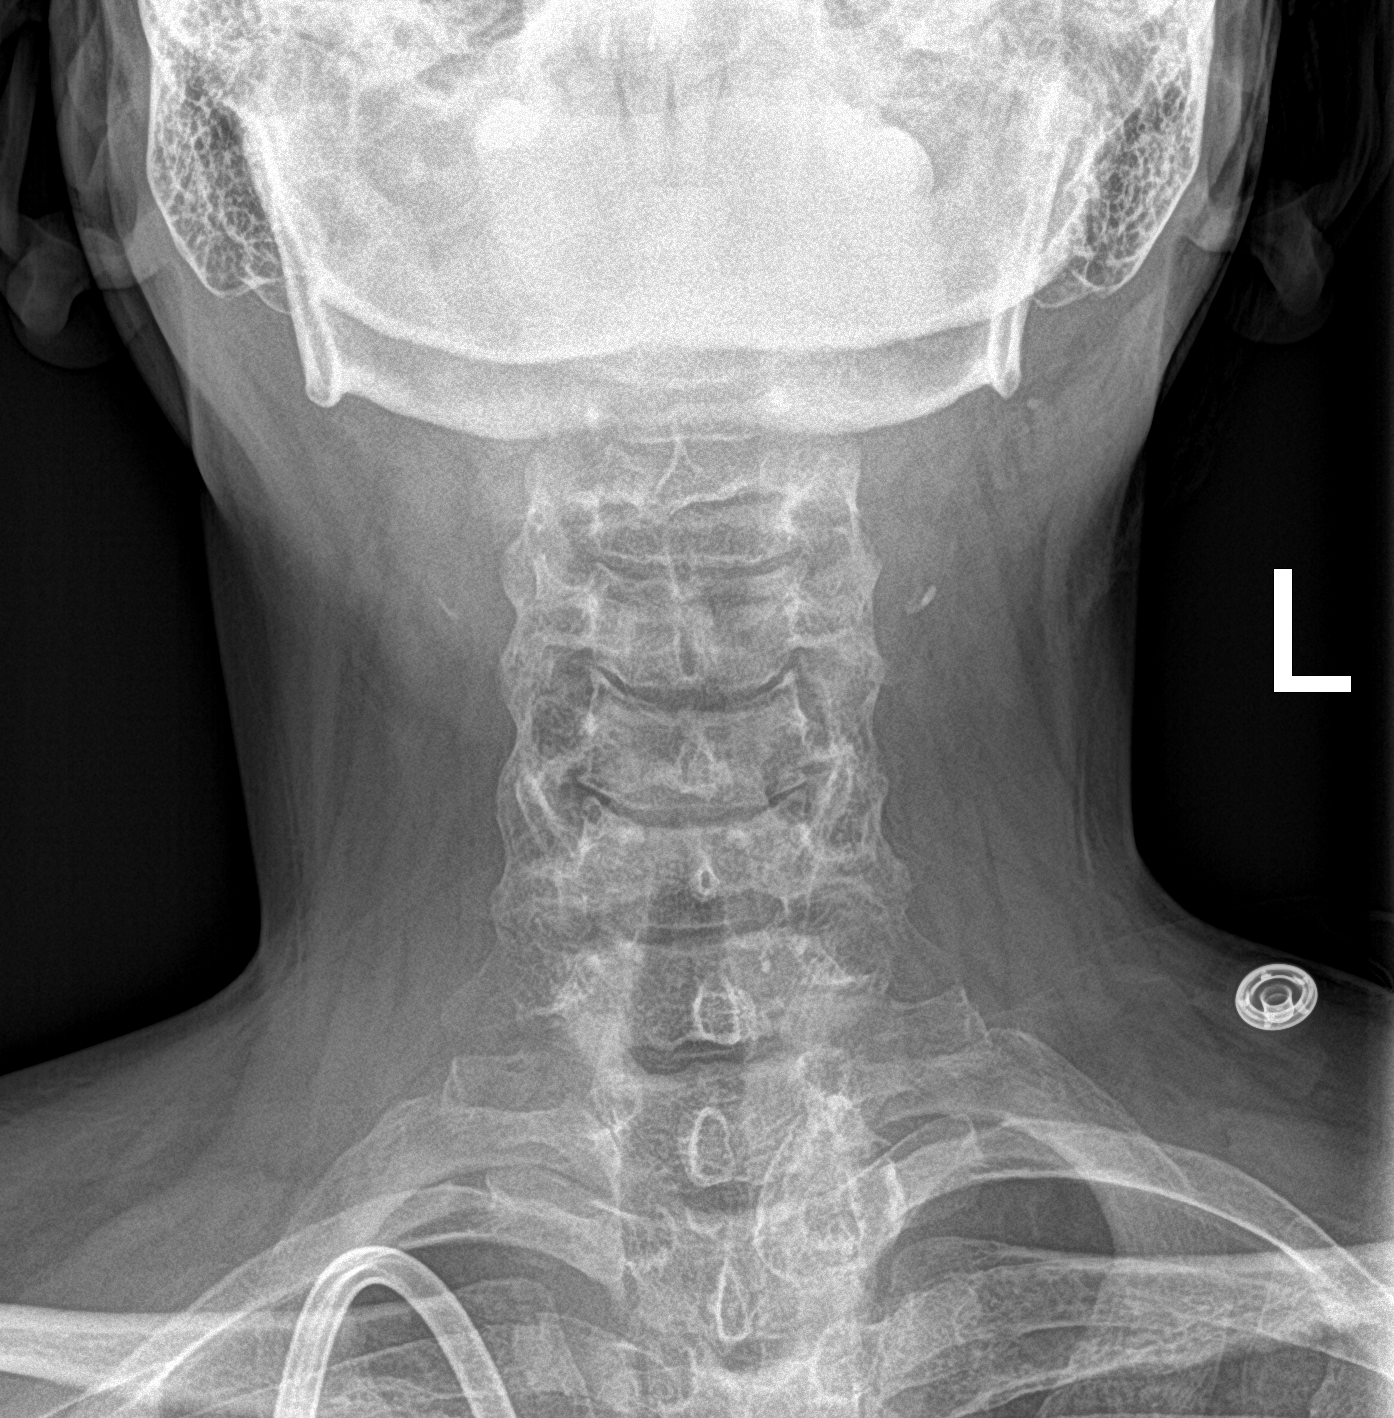

[2 of 2 positions shown; findings below may reference images not displayed]

FINDINGS: The peripherally calcified structure in the left neck is to the left
of the airway on the frontal view. The exact location is unclear.
This could be vascular in origin. A calcified thyroid nodule could
have a similar appearance as could a calcified lymph node. I doubt
this is a true foreign body. The soft tissues of the neck are
otherwise normal. Calcifications in the neck bilaterally are
consistent with carotid calcifications. No other abnormalities.
IMPRESSION: The peripherally calcified structure at the base of the neck on the
left near the thoracic inlet does not appear to be within the airway
and is favored to represent a vascular calcification, calcified
thyroid nodule, or calcified lymph node. A CT scan could best
evaluate this structure. A CT scan of the chest, with contrast,
including the base of the neck should be sufficient.

## 2022-04-03 IMAGING — DX DG CHEST 1V PORT
1 series · 1 of 1 positions shown · non-contrast
Comparison: 06/30/2020

CLINICAL DATA: Cough for 2 weeks.

EXAM:
PORTABLE CHEST 1 VIEW

[chest ap]
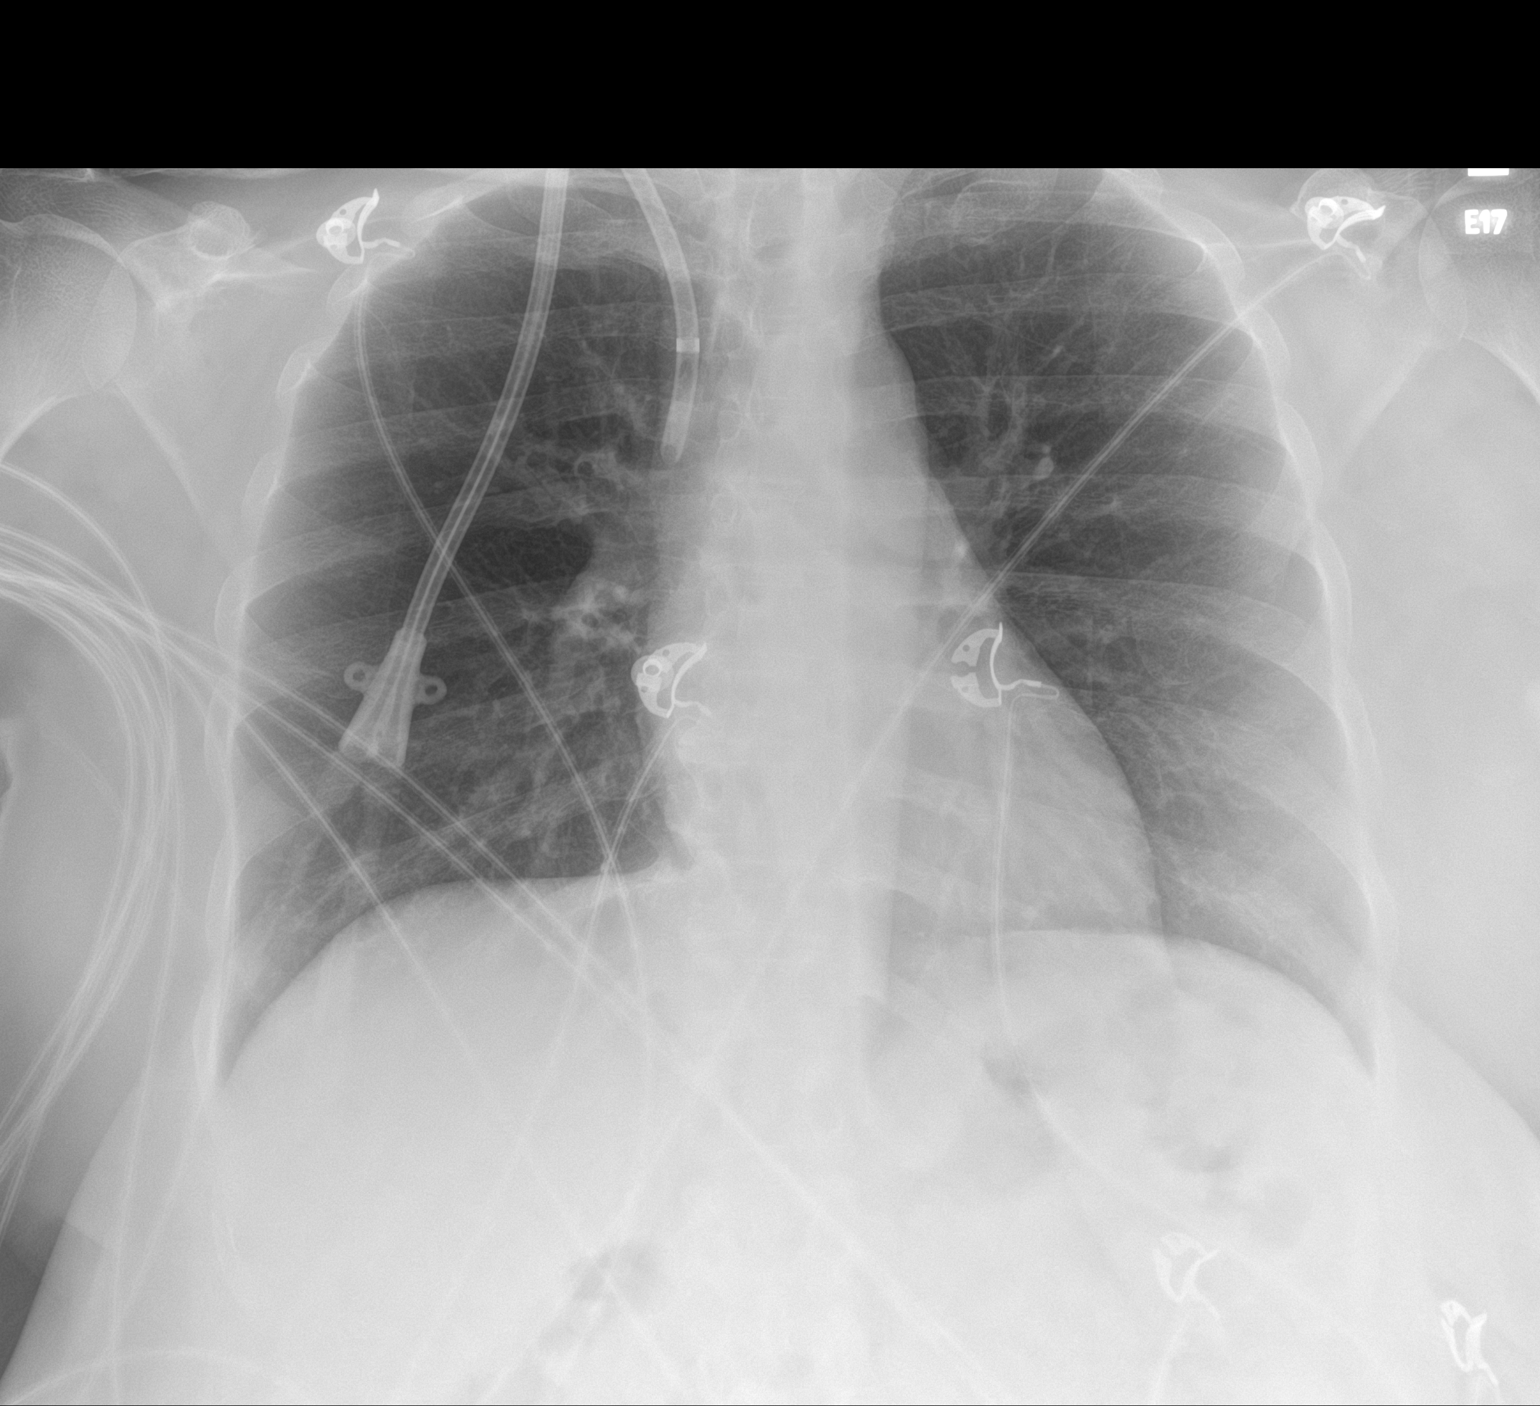

[1 of 1 positions shown; findings below may reference images not displayed]

FINDINGS: Right chest wall dialysis catheter is identified with tip projecting
over the SVC. Heart size and mediastinal contours are normal. No
pleural effusion or edema. No airspace densities identified.

2.3 cm indeterminate peripherally calcified structure projecting
over the medial aspect of the left supraclavicular region, adjacent
to the trachea. Not seen previously. This is of uncertain clinical
significance.
IMPRESSION: 1. No acute cardiopulmonary abnormalities.
2. Indeterminate peripherally calcified structure projecting over
the medial aspect of the left supraclavicular region adjacent to the
trachea. This is of uncertain clinical significance but was not seen
on the previous exams. Consider AP and lateral radiographs of the
soft tissue of neck to assess for foreign body in this patient
presenting with cough.

## 2022-04-05 ENCOUNTER — Other Ambulatory Visit: Payer: Self-pay

## 2022-04-05 DIAGNOSIS — Z79899 Other long term (current) drug therapy: Secondary | ICD-10-CM

## 2022-04-05 DIAGNOSIS — Z113 Encounter for screening for infections with a predominantly sexual mode of transmission: Secondary | ICD-10-CM

## 2022-04-05 DIAGNOSIS — B20 Human immunodeficiency virus [HIV] disease: Secondary | ICD-10-CM

## 2022-04-12 ENCOUNTER — Other Ambulatory Visit: Payer: POS

## 2022-04-12 ENCOUNTER — Other Ambulatory Visit: Payer: Self-pay

## 2022-04-12 DIAGNOSIS — Z79899 Other long term (current) drug therapy: Secondary | ICD-10-CM

## 2022-04-12 DIAGNOSIS — Z113 Encounter for screening for infections with a predominantly sexual mode of transmission: Secondary | ICD-10-CM

## 2022-04-12 DIAGNOSIS — B20 Human immunodeficiency virus [HIV] disease: Secondary | ICD-10-CM

## 2022-04-13 LAB — T-HELPER CELL (CD4) - (RCID CLINIC ONLY)
CD4 % Helper T Cell: 13 % — ABNORMAL LOW (ref 33–65)
CD4 T Cell Abs: 136 /uL — ABNORMAL LOW (ref 400–1790)

## 2022-04-14 LAB — CBC WITH DIFFERENTIAL/PLATELET
Absolute Monocytes: 490 cells/uL (ref 200–950)
Basophils Absolute: 39 cells/uL (ref 0–200)
Basophils Relative: 0.7 %
Eosinophils Absolute: 710 cells/uL — ABNORMAL HIGH (ref 15–500)
Eosinophils Relative: 12.9 %
HCT: 39.2 % (ref 35.0–45.0)
Hemoglobin: 13.4 g/dL (ref 11.7–15.5)
Lymphs Abs: 1078 cells/uL (ref 850–3900)
MCH: 33.5 pg — ABNORMAL HIGH (ref 27.0–33.0)
MCHC: 34.2 g/dL (ref 32.0–36.0)
MCV: 98 fL (ref 80.0–100.0)
MPV: 9.4 fL (ref 7.5–12.5)
Monocytes Relative: 8.9 %
Neutro Abs: 3185 cells/uL (ref 1500–7800)
Neutrophils Relative %: 57.9 %
Platelets: 244 10*3/uL (ref 140–400)
RBC: 4 10*6/uL (ref 3.80–5.10)
RDW: 12.8 % (ref 11.0–15.0)
Total Lymphocyte: 19.6 %
WBC: 5.5 10*3/uL (ref 3.8–10.8)

## 2022-04-14 LAB — COMPLETE METABOLIC PANEL WITH GFR
AG Ratio: 1.3 (calc) (ref 1.0–2.5)
ALT: 32 U/L — ABNORMAL HIGH (ref 6–29)
AST: 28 U/L (ref 10–35)
Albumin: 3.7 g/dL (ref 3.6–5.1)
Alkaline phosphatase (APISO): 184 U/L — ABNORMAL HIGH (ref 31–125)
BUN/Creatinine Ratio: 12 (calc) (ref 6–22)
BUN: 64 mg/dL — ABNORMAL HIGH (ref 7–25)
CO2: 16 mmol/L — ABNORMAL LOW (ref 20–32)
Calcium: 9 mg/dL (ref 8.6–10.2)
Chloride: 114 mmol/L — ABNORMAL HIGH (ref 98–110)
Creat: 5.29 mg/dL — ABNORMAL HIGH (ref 0.50–0.99)
Globulin: 2.9 g/dL (calc) (ref 1.9–3.7)
Glucose, Bld: 79 mg/dL (ref 65–99)
Potassium: 5.1 mmol/L (ref 3.5–5.3)
Sodium: 139 mmol/L (ref 135–146)
Total Bilirubin: 0.3 mg/dL (ref 0.2–1.2)
Total Protein: 6.6 g/dL (ref 6.1–8.1)
eGFR: 9 mL/min/{1.73_m2} — ABNORMAL LOW (ref >=60)

## 2022-04-14 LAB — LIPID PANEL
Cholesterol: 216 mg/dL — ABNORMAL HIGH (ref <200)
HDL: 76 mg/dL (ref >=50)
LDL Cholesterol (Calc): 117 mg/dL (calc) — ABNORMAL HIGH
Non-HDL Cholesterol (Calc): 140 mg/dL (calc) — ABNORMAL HIGH (ref <130)
Total CHOL/HDL Ratio: 2.8 (calc) (ref <5.0)
Triglycerides: 124 mg/dL (ref <150)

## 2022-04-14 LAB — HIV-1 RNA QUANT-NO REFLEX-BLD
HIV 1 RNA Quant: NOT DETECTED Copies/mL
HIV-1 RNA Quant, Log: NOT DETECTED Log cps/mL

## 2022-04-14 LAB — RPR: RPR Ser Ql: NONREACTIVE

## 2022-04-25 ENCOUNTER — Encounter: Payer: Self-pay | Admitting: Internal Medicine

## 2022-04-25 ENCOUNTER — Other Ambulatory Visit: Payer: Self-pay

## 2022-04-25 ENCOUNTER — Ambulatory Visit (INDEPENDENT_AMBULATORY_CARE_PROVIDER_SITE_OTHER): Payer: POS | Admitting: Internal Medicine

## 2022-04-25 VITALS — BP 166/120 | HR 75 | Temp 98.3°F | Wt 179.6 lb

## 2022-04-25 DIAGNOSIS — B20 Human immunodeficiency virus [HIV] disease: Secondary | ICD-10-CM

## 2022-04-25 DIAGNOSIS — Z298 Encounter for other specified prophylactic measures: Secondary | ICD-10-CM

## 2022-04-25 DIAGNOSIS — Z7185 Encounter for immunization safety counseling: Secondary | ICD-10-CM

## 2022-04-25 DIAGNOSIS — Z23 Encounter for immunization: Secondary | ICD-10-CM

## 2022-04-25 DIAGNOSIS — D259 Leiomyoma of uterus, unspecified: Secondary | ICD-10-CM

## 2022-04-25 DIAGNOSIS — Z992 Dependence on renal dialysis: Secondary | ICD-10-CM

## 2022-04-25 DIAGNOSIS — N186 End stage renal disease: Secondary | ICD-10-CM | POA: Diagnosis not present

## 2022-04-25 MED ORDER — ATOVAQUONE 750 MG/5ML PO SUSP: 1500.0000 mg | Freq: Every day | ORAL | 2 refills | Status: AC

## 2022-04-25 NOTE — Assessment & Plan Note (Signed)
Recommend 2nd dose of hepatitis A vaccine today and PCV 20.  She is agreeable so will administer today.

## 2022-04-25 NOTE — Progress Notes (Signed)
[facility]   CHIEF COMPLAINT    HIV follow up.    SUBJECTIVE:    Brooke Baldwin is a 46 y.o. female with PMHx as below who presents to the clinic for HIV follow up.   Please see A&P for the details of today's visit and status of the patient's medical problems.   Patient's Medications  New Prescriptions   ATOVAQUONE (MEPRON) 750 MG/5ML SUSPENSION    Take 10 mLs (1,500 mg total) by mouth daily.  Previous Medications   AMLODIPINE (NORVASC) 10 MG TABLET    Take 1 tablet (10 mg total) by mouth daily.   BICTEGRAVIR-EMTRICITABINE-TENOFOVIR AF (BIKTARVY) 50-200-25 MG TABS TABLET    Take 1 tablet by mouth at bedtime.   FERRIC CITRATE (AURYXIA) 1 GM 210 MG(FE) TABLET    Take 210-420 mg by mouth 3 (three) times daily with meals.   HYDRALAZINE (APRESOLINE) 25 MG TABLET    Take 1 tablet (25 mg total) by mouth 2 (two) times daily.   MULTIVITAMIN (RENA-VIT) TABS TABLET    Take 1 tablet by mouth at bedtime.   POLYETHYLENE GLYCOL POWDER (GLYCOLAX/MIRALAX) 17 GM/SCOOP POWDER    Take 1 capful with water (17 g) by mouth 2 (two) times daily.   SENNA-DOCUSATE (SENOKOT-S) 8.6-50 MG TABLET    Take 1 tablet by mouth 2 (two) times daily.   SODIUM BICARBONATE 650 MG TABLET    Take 1 tablet (650 mg total) by mouth 2 (two) times daily.  Modified Medications   No medications on file  Discontinued Medications   OXYCODONE-ACETAMINOPHEN (PERCOCET/ROXICET) 5-325 MG TABLET    Take 1 tablet by mouth every 4 (four) hours as needed for pain.      Past Medical History:  Diagnosis Date   Anemia associated with chronic renal failure    Atrophic kidney    bilateral   CIN I (cervical intraepithelial neoplasia I)    ESRD (end stage renal disease) on dialysis ([place]) 05/2020   hemodialysis  T/ Th/ Sat  @ [facility] ([address] in [place], ( 10-17-2020  s/p left AVG 11/ 2021 unable to use as of yet, currently using right IJ tunneled cath (right side of upper chest) for dialysis   FSGS  (focal segmental glomerulosclerosis)    renal biopsy 04-15-2020 in epic , secondary to HTN/ HIV/ Obese   Heart palpitations 11/ 2021  (10-17-2020  pt stated the palpitations does not bother her and has no other symptoms , stated they started 11/ 2021)   pt was been seen by cardiology, dr s. Patria Mane ([facility] in [place]),  per pt had event monitor for 17 days , was told normal with  no arrhythmias,  and stated echo is scheduled for 10-28-2020   History of vulvar dysplasia    HIV (human immunodeficiency virus infection) ([place])    followed by ID-- dr a. Rotunda Worden   Hypertension    S/P arteriovenous (AV) fistula creation 07-04-2020 '@[facility]    left forearm,  07-28-2020  angioplasty of thrombosis   Secondary hyperparathyroidism of renal origin ([place])    Tachycardia    Thrombocytopenia ([place]) 07/01/2020   Trigeminal neuralgia    ride side ---  neurologist--- dr Krista Blue   (10-17-2020  pt stated last episode about 4 months ago was mild)   VIN II (vulvar intraepithelial neoplasia II)     Social History   Tobacco Use   Smoking status: Never   Smokeless tobacco: Never  Vaping Use  Vaping Use: Never used  Substance Use Topics   Alcohol use: Not Currently   Drug use: Not Currently    Family History  Problem Relation Age of Onset   Diabetes Mother    Healthy Father    Breast cancer Maternal Aunt    Pancreatic cancer Maternal Grandfather    Colon cancer Neg Hx    Colon polyps Neg Hx    Esophageal cancer Neg Hx    Rectal cancer Neg Hx    Stomach cancer Neg Hx    Prostate cancer Neg Hx    Endometrial cancer Neg Hx    Ovarian cancer Neg Hx     Allergies  Allergen Reactions   Lioresal [Baclofen] Other (See Comments)    Intolerable dizziness   Neurontin [Gabapentin] Other (See Comments)    Intolerable dizziness   Penicillins Other (See Comments)    Childhood allergy Unknown reaction     Review of Systems  Constitutional: Negative.   Respiratory: Negative.    Cardiovascular: Negative.    Gastrointestinal: Negative.      OBJECTIVE:    Vitals:   04/25/22 1338  BP: (!) 166/120  Pulse: 75  Temp: 98.3 F (36.8 C)  SpO2: 100%  Weight: 179 lb 9.6 oz (81.5 kg)     Body mass index is 33.94 kg/m.  Physical Exam Constitutional:      General: She is not in acute distress.    Appearance: Normal appearance.  HENT:     Head: Normocephalic and atraumatic.  Pulmonary:     Effort: Pulmonary effort is normal. No respiratory distress.  Musculoskeletal:        General: Normal range of motion.  Skin:    General: Skin is warm and dry.  Neurological:     General: No focal deficit present.     Mental Status: She is alert and oriented to person, place, and time.     Labs and Microbiology:    Latest Ref Rng & Units 04/12/2022    9:39 AM 12/28/2021    1:02 AM 12/27/2021    2:17 PM  CMP  Glucose 65 - 99 mg/dL 79  113  62   BUN 7 - 25 mg/dL 64  49  54   Creatinine 0.50 - 0.99 mg/dL 5.29  5.61  5.99   Sodium 135 - 146 mmol/L 139  135  138   Potassium 3.5 - 5.3 mmol/L 5.1  4.3  5.8   Chloride 98 - 110 mmol/L 114  111  110   CO2 20 - 32 mmol/L '16  16  18   '$ Calcium 8.6 - 10.2 mg/dL 9.0  9.1  10.3   Total Protein 6.1 - 8.1 g/dL 6.6  6.7  8.5   Total Bilirubin 0.2 - 1.2 mg/dL 0.3  0.5  0.7   Alkaline Phos 38 - 126 U/L  127  147   AST 10 - 35 U/L '28  18  18   '$ ALT 6 - 29 U/L 32  17  16       Latest Ref Rng & Units 04/12/2022    9:39 AM 12/27/2021    2:17 PM 11/08/2021   10:31 AM  CBC  WBC 3.8 - 10.8 Thousand/uL 5.5  5.5  4.6   Hemoglobin 11.7 - 15.5 g/dL 13.4  13.6  13.2   Hematocrit 35.0 - 45.0 % 39.2  42.8  39.5   Platelets 140 - 400 Thousand/uL 244  274  230      Lab  Results  Component Value Date   HIV1RNAQUANT Not Detected 04/12/2022   HIV1RNAQUANT Not Detected 09/27/2021   HIV1RNAQUANT <20 08/13/2021   CD4TABS 136 (L) 04/12/2022   CD4TABS 107 (L) 08/13/2021   CD4TABS 81 (L) 05/04/2021    RPR and STI: Lab Results  Component Value Date   LABRPR NON-REACTIVE  04/12/2022   LABRPR NON-REACTIVE 09/27/2021   LABRPR NON-REACTIVE 05/04/2021   LABRPR NON REACTIVE 07/03/2020    STI Results GC CT  05/04/2021 10:55 AM Negative  Negative     Hepatitis B: Lab Results  Component Value Date   HEPBSAB NON REACTIVE 10/22/2021   HEPBSAG NON REACTIVE 10/22/2021   HEPBCAB NON REACTIVE 07/03/2020   Hepatitis C: No results found for: "HEPCAB", "HCVRNAPCRQN" Hepatitis A: Lab Results  Component Value Date   HAV NON REACTIVE 07/03/2020   Lipids: Lab Results  Component Value Date   CHOL 216 (H) 04/12/2022   TRIG 124 04/12/2022   HDL 76 04/12/2022   CHOLHDL 2.8 04/12/2022   LDLCALC 117 (H) 04/12/2022    Imaging:    ASSESSMENT & PLAN:    HIV (human immunodeficiency virus infection) ([place]) Here today for routine HIV follow up.  Currently on Biktarvy and doing well with no issues noted.  She had labs drawn 04/12/22 and her VL was undetectable, CD 4 count 136 (13%). Will continue daily Biktarvy and follow up again in 6 months.  Refills are on file.   Need for pneumocystis prophylaxis She is not taking her Atovaquone that was refilled last visit in March 2023.  Discussed rationale for treatment.  She currently agrees to resume and will send in new prescription.  Vaccine counseling Recommend 2nd dose of hepatitis A vaccine today and PCV 20.  She is agreeable so will administer today.   End stage renal disease ([place]) She stopped going to HD several months ago and to date has not required reinitiating dialysis.  Labs overall stable on 04/12/22.  Continue to follow up with nephrology.  Also advised nephrology follow up regarding BP management.   Uterine fibroid She had a labial biopsy done done 04/17/22 in [place] showing HSIL (VIN 3).  Will continue to follow up with Gyn there where she also reports she will be seen for hysterectomy and fibroid surgery.     Rayn[facility] 04/25/2022,  1:59 PM      I have personally spent 40 minutes involved in face-to-face and non-face-to-face activities for this patient on the day of the visit. Professional time spent includes the following activities: Preparing to see the patient (review of tests), Obtaining and/or reviewing separately obtained history (admission/discharge record), Performing a medically appropriate examination and/or evaluation , Ordering medications/tests/procedures, referring and communicating with other health care professionals, Documenting clinical information in the EMR, Independently interpreting results (not separately reported), Communicating results to the patient/family/caregiver, Counseling and educating the patient/family/caregiver and Care coordination (not separately reported).

## 2022-04-25 NOTE — Assessment & Plan Note (Signed)
Here today for routine HIV follow up.  Currently on Biktarvy and doing well with no issues noted.  She had labs drawn 04/12/22 and her VL was undetectable, CD 4 count 136 (13%). Will continue daily Biktarvy and follow up again in 6 months.  Refills are on file.

## 2022-04-25 NOTE — Assessment & Plan Note (Signed)
She stopped going to HD several months ago and to date has not required reinitiating dialysis.  Labs overall stable on 04/12/22.  Continue to follow up with nephrology.  Also advised nephrology follow up regarding BP management.

## 2022-04-25 NOTE — Patient Instructions (Signed)
Thank you for coming to see me today. It was a pleasure seeing you.  To Do: Vaccine today Continue Biktarvy I refilled your Atovaquone for infection prevention Follow up with your kidney doctor about blood pressure medication options  If you have any questions or concerns, please do not hesitate to call the office at [phone].  Take Care,   Brooke Baldwin

## 2022-04-25 NOTE — Assessment & Plan Note (Signed)
She is not taking her Atovaquone that was refilled last visit in March 2023.  Discussed rationale for treatment.  She currently agrees to resume and will send in new prescription.

## 2022-04-25 NOTE — Addendum Note (Signed)
Addended by: Tomi Bamberger on: 04/25/2022 02:07 PM   Modules accepted: Orders

## 2022-04-25 NOTE — Assessment & Plan Note (Signed)
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  Will continue to follow up with Gyn there where she also reports she will be seen for hysterectomy and fibroid surgery.

## 2022-04-26 ENCOUNTER — Encounter: Payer: POS | Admitting: Internal Medicine

## 2022-05-18 ENCOUNTER — Other Ambulatory Visit: Payer: Self-pay | Admitting: Physician Assistant

## 2022-05-18 DIAGNOSIS — D259 Leiomyoma of uterus, unspecified: Secondary | ICD-10-CM

## 2022-05-23 ENCOUNTER — Ambulatory Visit
Admission: RE | Admit: 2022-05-23 | Discharge: 2022-05-23 | Disposition: A | Discharge status: Home / Self Care | Payer: POS | Source: Ambulatory Visit | Attending: Physician Assistant | Admitting: Physician Assistant

## 2022-05-23 DIAGNOSIS — D259 Leiomyoma of uterus, unspecified: Secondary | ICD-10-CM

## 2022-06-12 IMAGING — CR DG CHEST 2V
2 series · 2 of 2 positions shown · non-contrast
Comparison: 08/14/2021

CLINICAL DATA: Pain around tunneled dialysis catheter

EXAM:
CHEST - 2 VIEW

[chest pa]
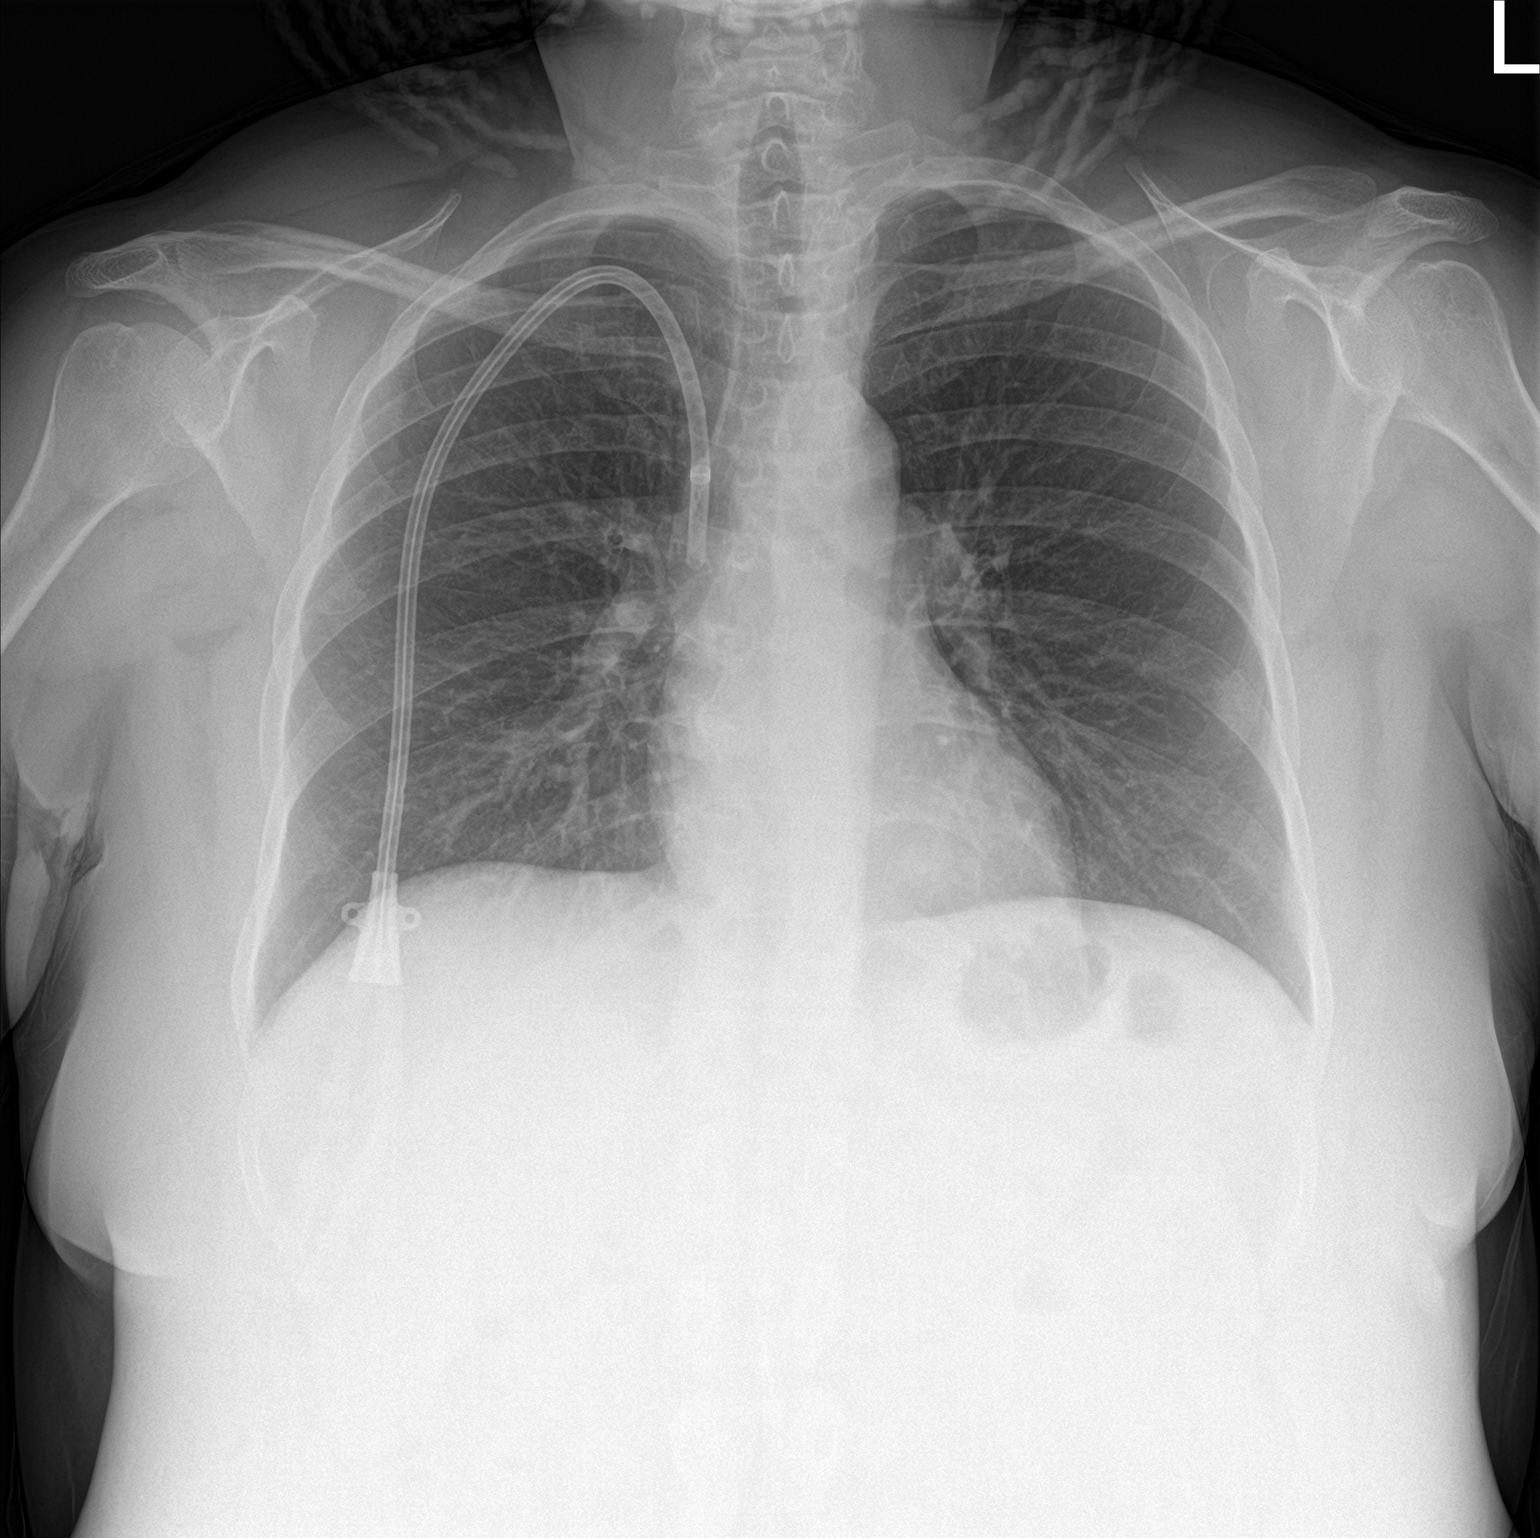

[chest lat]
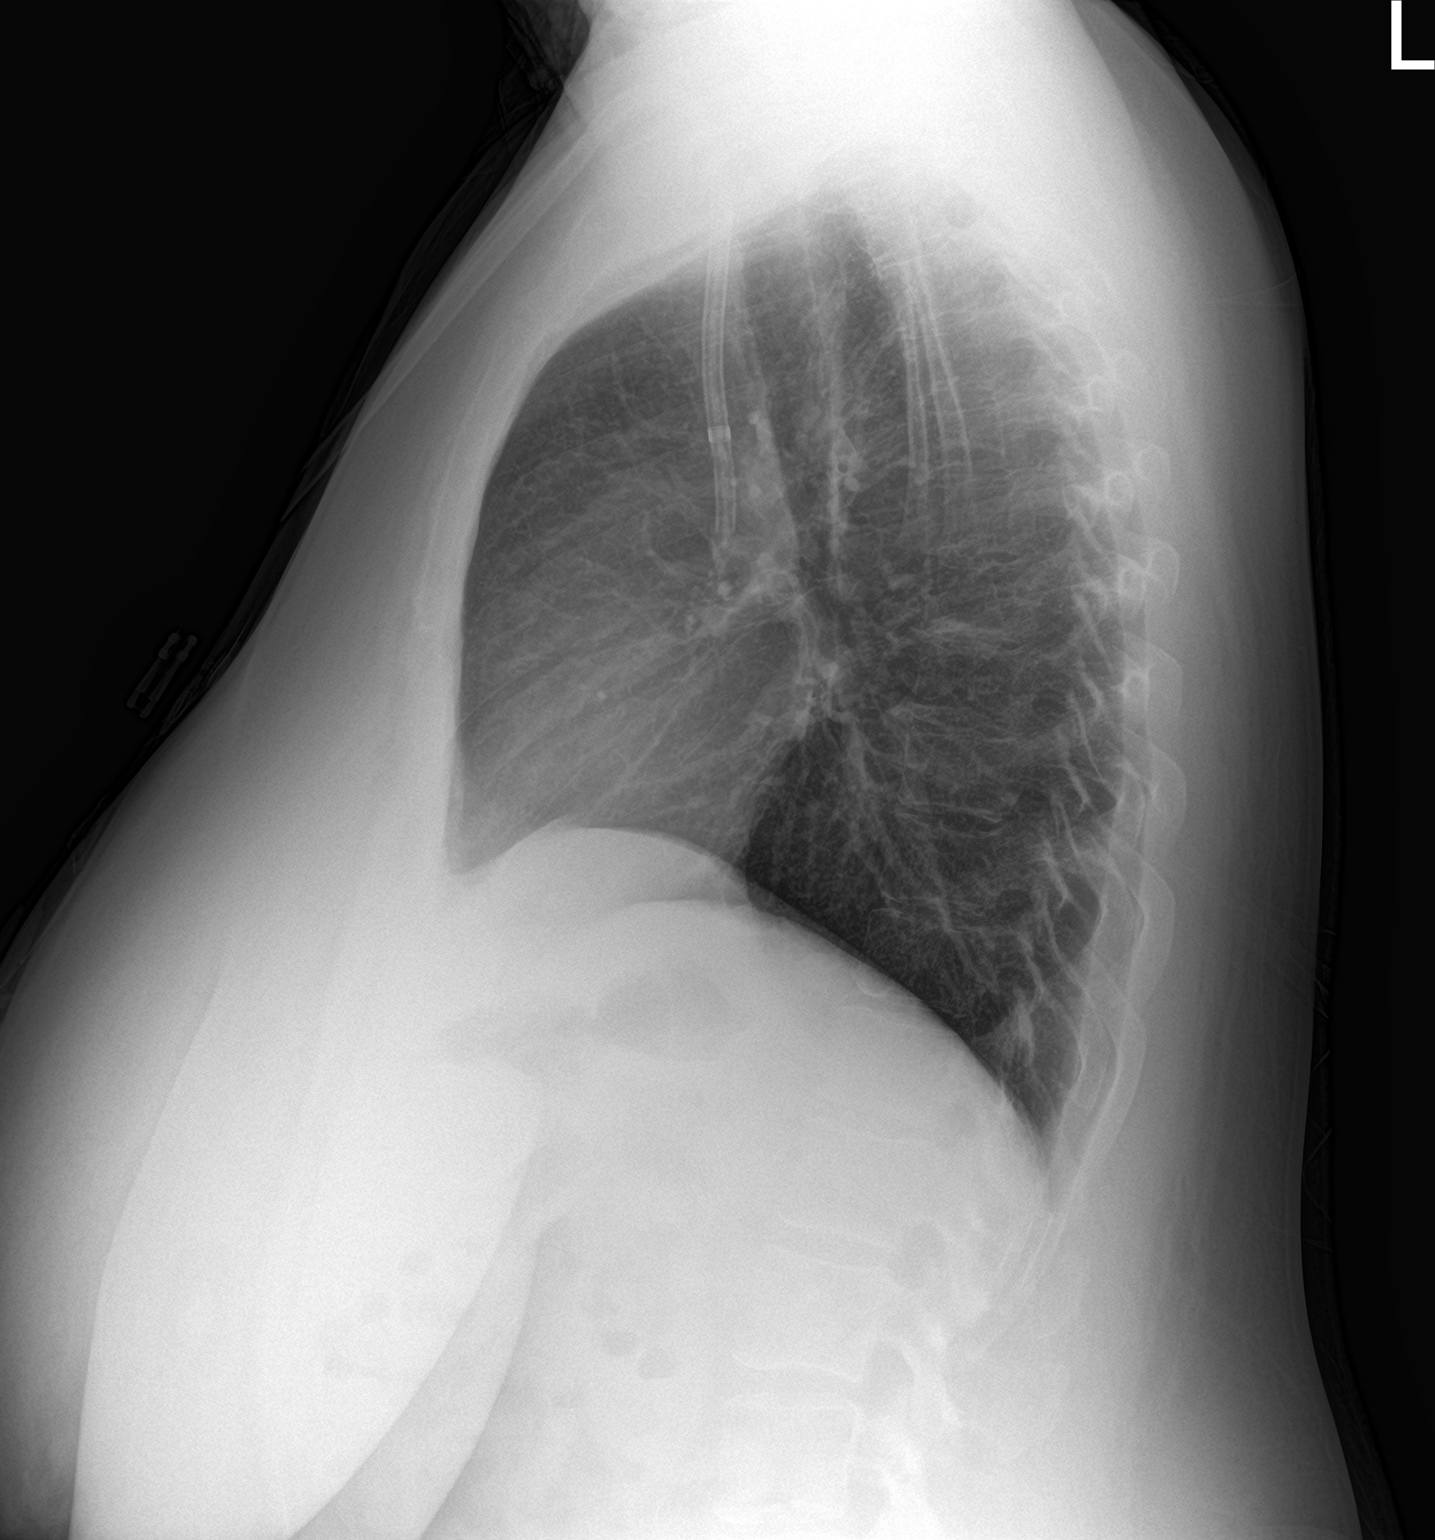

[2 of 2 positions shown; findings below may reference images not displayed]

FINDINGS: The heart size and mediastinal contours are within normal limits.
Large-bore right neck multi lumen vascular catheter, tip appears
somewhat retracted compared to prior examination, now over the mid
SVC. Both lungs are clear. The visualized skeletal structures are
unremarkable.
IMPRESSION: 1. No acute abnormality of the lungs.
2. Large-bore right neck multi lumen vascular catheter, tip appears
somewhat retracted compared to prior examination, now over the mid
SVC.

## 2022-06-15 ENCOUNTER — Encounter (HOSPITAL_BASED_OUTPATIENT_CLINIC_OR_DEPARTMENT_OTHER): Payer: Self-pay | Admitting: Emergency Medicine

## 2022-06-15 ENCOUNTER — Emergency Department (HOSPITAL_BASED_OUTPATIENT_CLINIC_OR_DEPARTMENT_OTHER)
Admission: EM | Admit: 2022-06-15 | Discharge: 2022-06-15 | Disposition: A | Discharge status: Home / Self Care | Payer: POS | Attending: Emergency Medicine | Admitting: Emergency Medicine

## 2022-06-15 ENCOUNTER — Other Ambulatory Visit: Payer: Self-pay

## 2022-06-15 ENCOUNTER — Emergency Department (HOSPITAL_BASED_OUTPATIENT_CLINIC_OR_DEPARTMENT_OTHER): Payer: POS | Admitting: Radiology

## 2022-06-15 DIAGNOSIS — Z21 Asymptomatic human immunodeficiency virus [HIV] infection status: Secondary | ICD-10-CM | POA: Diagnosis not present

## 2022-06-15 DIAGNOSIS — R062 Wheezing: Secondary | ICD-10-CM | POA: Diagnosis present

## 2022-06-15 DIAGNOSIS — I10 Essential (primary) hypertension: Secondary | ICD-10-CM

## 2022-06-15 DIAGNOSIS — I120 Hypertensive chronic kidney disease with stage 5 chronic kidney disease or end stage renal disease: Secondary | ICD-10-CM | POA: Diagnosis not present

## 2022-06-15 DIAGNOSIS — Z79899 Other long term (current) drug therapy: Secondary | ICD-10-CM | POA: Insufficient documentation

## 2022-06-15 DIAGNOSIS — N186 End stage renal disease: Secondary | ICD-10-CM | POA: Diagnosis not present

## 2022-06-15 LAB — BRAIN NATRIURETIC PEPTIDE: B Natriuretic Peptide: 31.3 pg/mL (ref 0.0–100.0)

## 2022-06-15 LAB — CBC WITH DIFFERENTIAL/PLATELET
Abs Immature Granulocytes: 0.02 10*3/uL (ref 0.00–0.07)
Basophils Absolute: 0 10*3/uL (ref 0.0–0.1)
Basophils Relative: 1 %
Eosinophils Absolute: 0.4 10*3/uL (ref 0.0–0.5)
Eosinophils Relative: 7 %
HCT: 46.8 % — ABNORMAL HIGH (ref 36.0–46.0)
Hemoglobin: 15.1 g/dL — ABNORMAL HIGH (ref 12.0–15.0)
Immature Granulocytes: 0 %
Lymphocytes Relative: 18 %
Lymphs Abs: 1.1 10*3/uL (ref 0.7–4.0)
MCH: 31.9 pg (ref 26.0–34.0)
MCHC: 32.3 g/dL (ref 30.0–36.0)
MCV: 98.9 fL (ref 80.0–100.0)
Monocytes Absolute: 0.3 10*3/uL (ref 0.1–1.0)
Monocytes Relative: 6 %
Neutro Abs: 4.1 10*3/uL (ref 1.7–7.7)
Neutrophils Relative %: 68 %
Platelets: 218 10*3/uL (ref 150–400)
RBC: 4.73 MIL/uL (ref 3.87–5.11)
RDW: 13.1 % (ref 11.5–15.5)
WBC: 6 10*3/uL (ref 4.0–10.5)
nRBC: 0 % (ref 0.0–0.2)

## 2022-06-15 LAB — BASIC METABOLIC PANEL
Anion gap: 17 — ABNORMAL HIGH (ref 5–15)
BUN: 64 mg/dL — ABNORMAL HIGH (ref 6–20)
CO2: 15 mmol/L — ABNORMAL LOW (ref 22–32)
Calcium: 9.5 mg/dL (ref 8.9–10.3)
Chloride: 109 mmol/L (ref 98–111)
Creatinine, Ser: 5.76 mg/dL — ABNORMAL HIGH (ref 0.44–1.00)
GFR, Estimated: 9 mL/min — ABNORMAL LOW (ref >60)
Glucose, Bld: 87 mg/dL (ref 70–99)
Potassium: 4.7 mmol/L (ref 3.5–5.1)
Sodium: 141 mmol/L (ref 135–145)

## 2022-06-15 IMAGING — US US SOFT TISSUE
1 series · 14 of 16 positions shown · non-contrast
Comparison: None.

CLINICAL DATA: Evaluate for abscess.

EXAM:
ULTRASOUND OF CHEST SOFT TISSUES
TECHNIQUE: Ultrasound examination of the chest wall soft tissues was performed
in the area of clinical concern.

[Series 1: us chest soft tissue · 22 acquisitions, 14 frames shown]
[im 1/22]
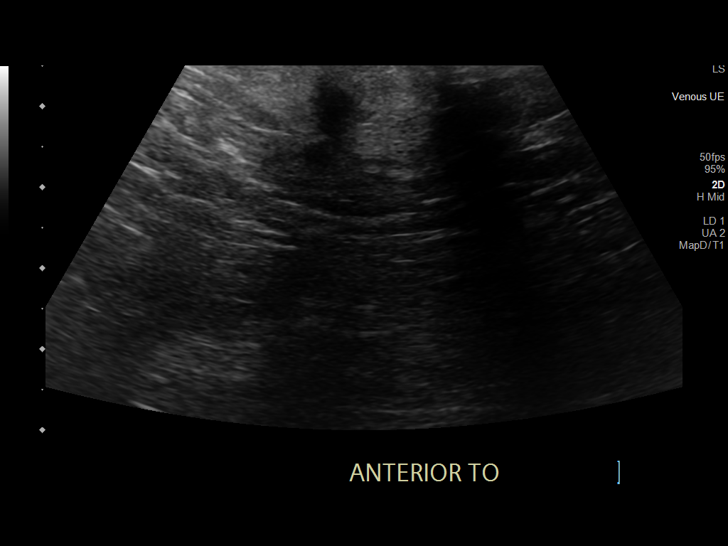
[im 2/22]
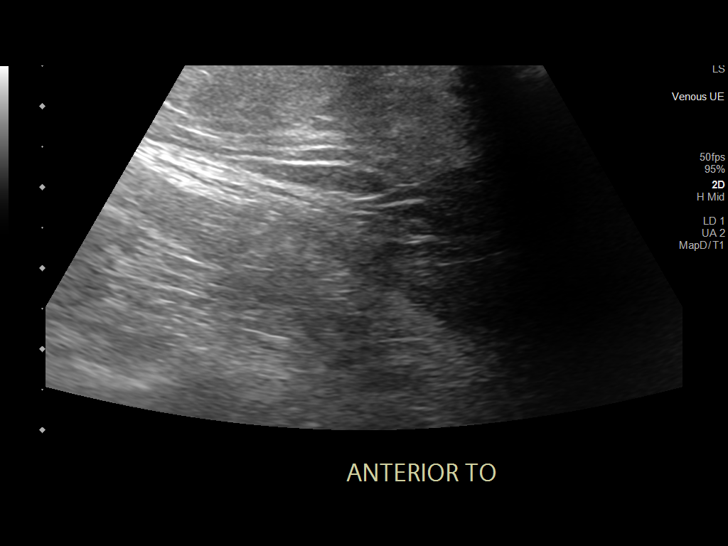
[im 3/22]
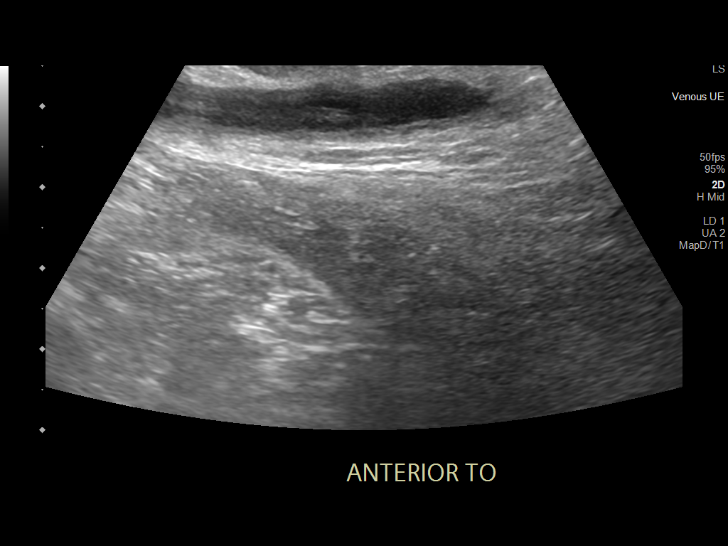
[im 6/22]
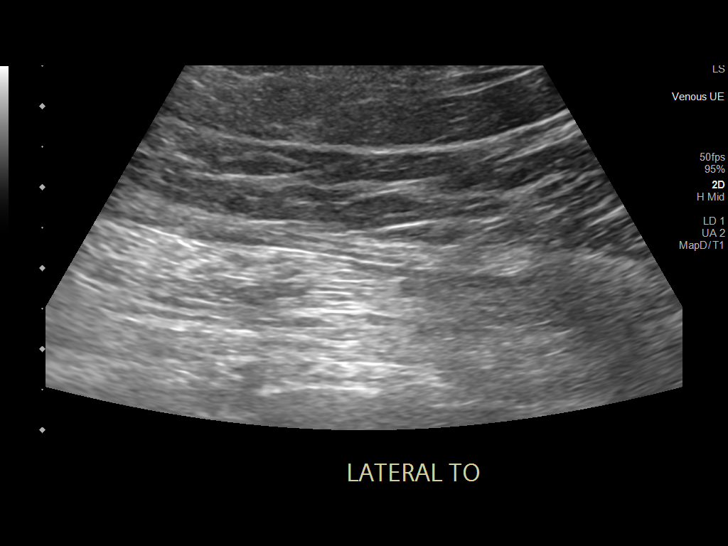
[im 8/22]
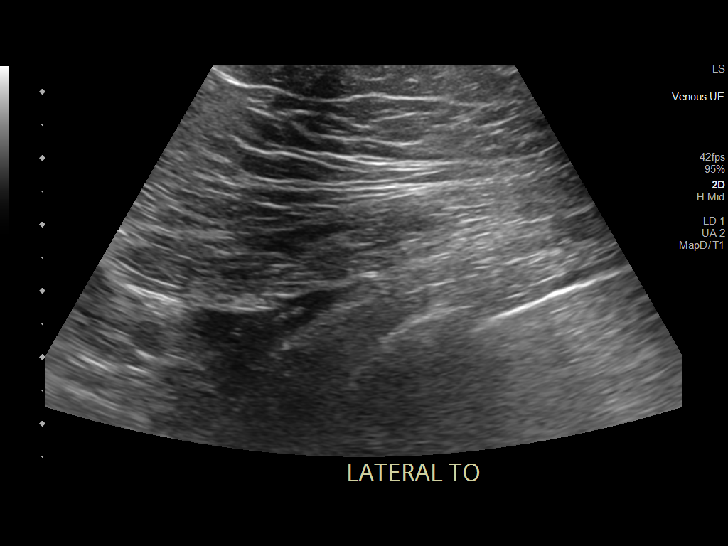
[im 9/22]
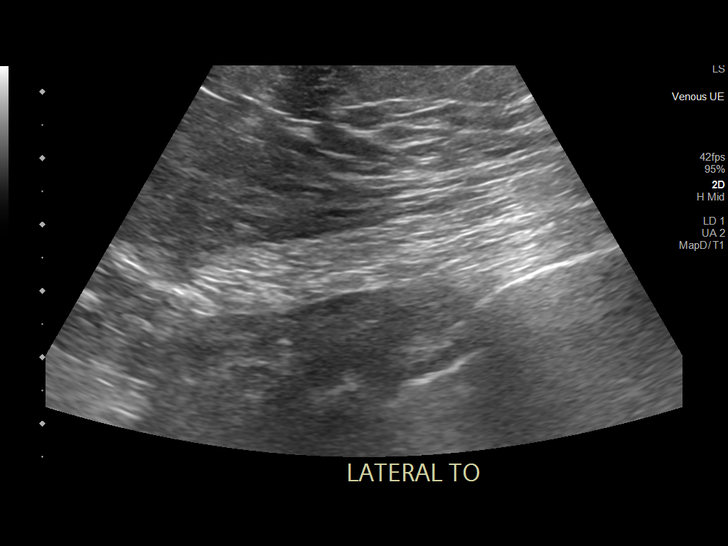
[im 10/22]
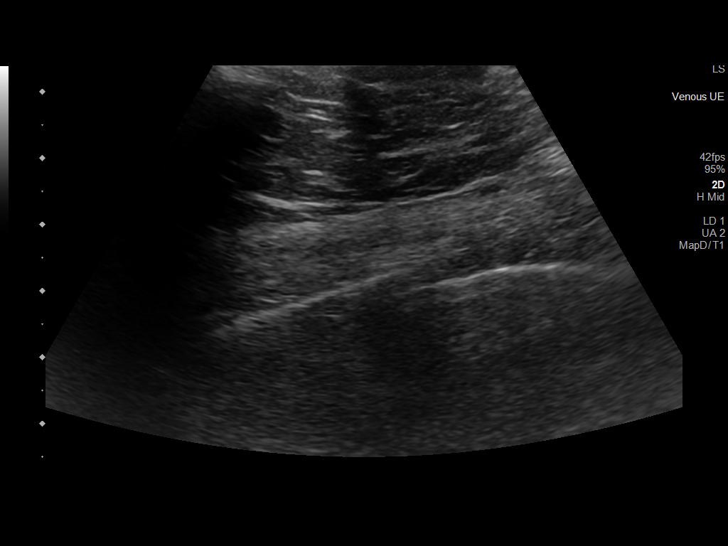
[im 12/22]
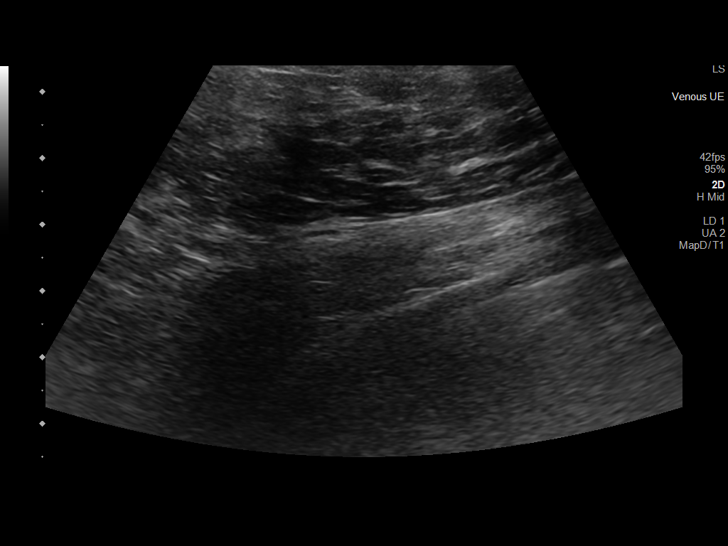
[im 13/22]
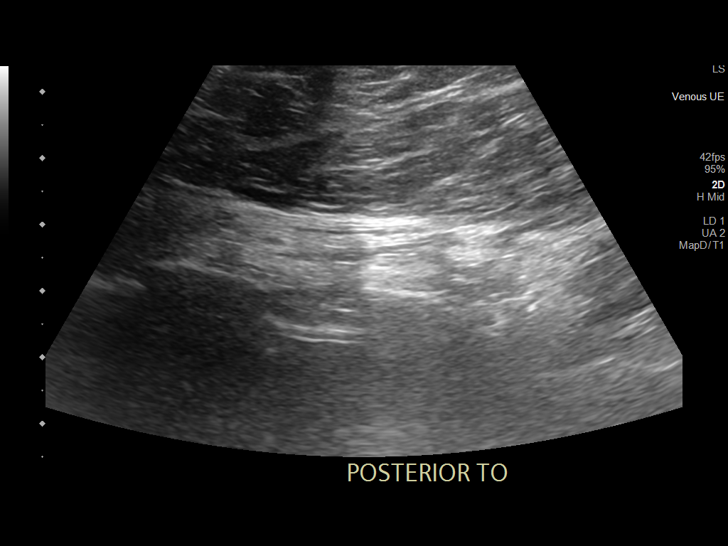
[im 15/22]
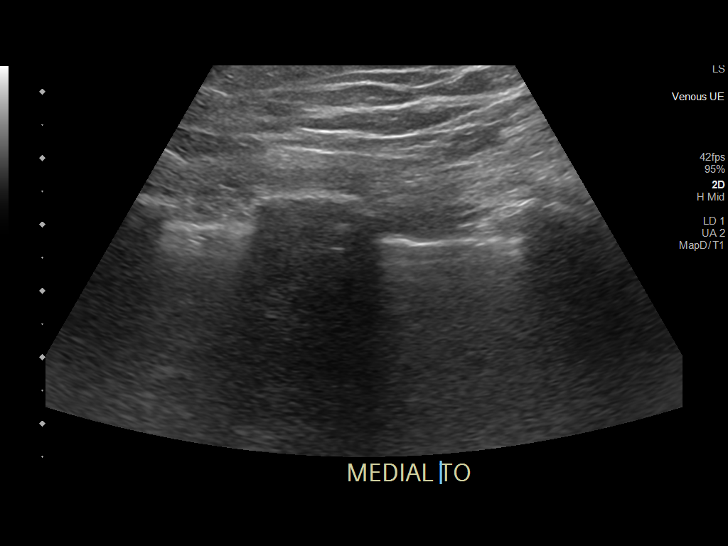
[im 17/22]
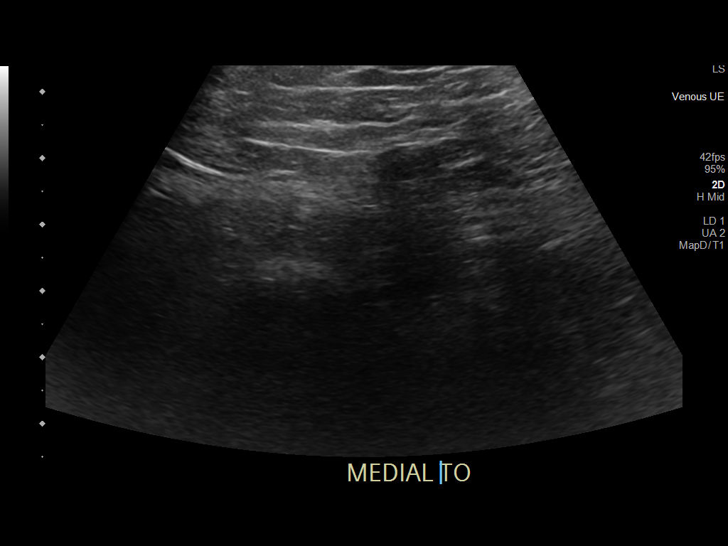
[im 19/22]
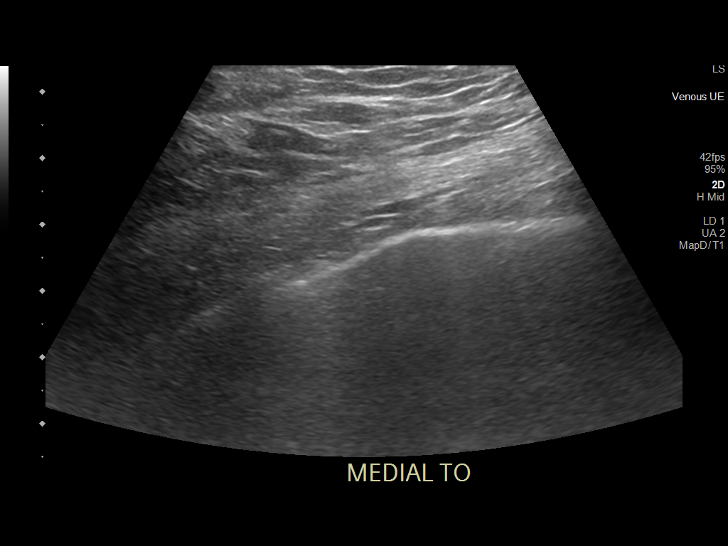
[im 20/22]
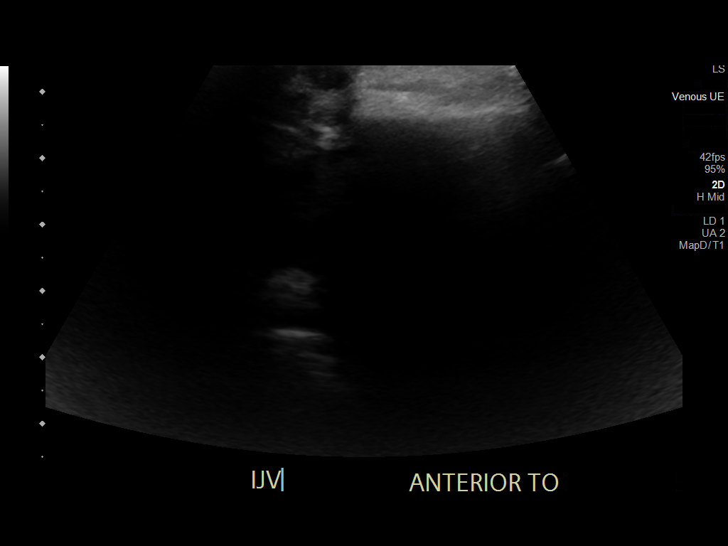
[im 22/22]
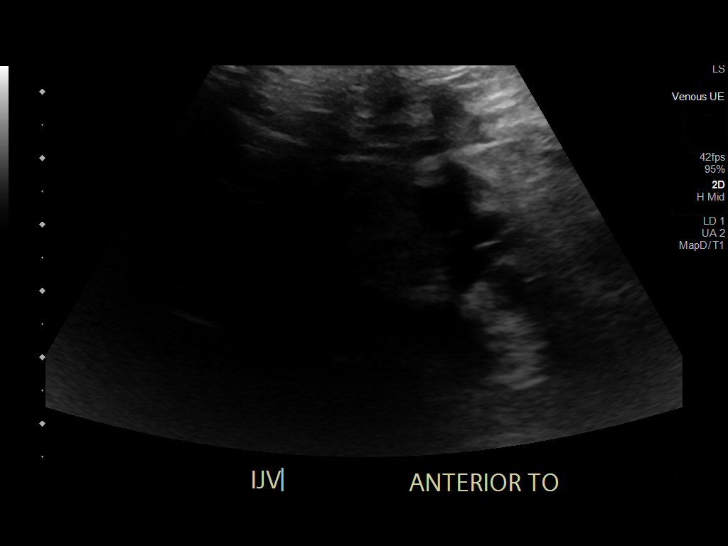

[14 of 16 positions shown; findings below may reference images not displayed]

FINDINGS: Targeted sonographic images of the right side of the chest and neck
in the region of the clinical concern was performed.

No mass, fluid collection, architectural distortion, or abnormal
vascularity.

There is incomplete compressibility of the visualized portion of the
jugular vein with peripheral clot or scarring, possibly related to
catheter placement.

There is an 8 mm somewhat spongiform nodule in the right thyroid
lobe. Not clinically significant; no follow-up imaging recommended
(ref: [HOSPITAL]. [DATE]): 143-50).
IMPRESSION: 1. No abscess or fluid collection.
2. Probable scarring of the visualized vein related to catheter
placement. Dedicated duplex ultrasound of the neck veins recommended
for better evaluation.

These results will be called to the ordering clinician or
representative by the Radiologist Assistant, and communication
documented in the PACS or [REDACTED].

## 2022-06-15 MED ORDER — ALBUTEROL SULFATE HFA 108 (90 BASE) MCG/ACT IN AERS: 1.0000 | INHALATION_SPRAY | Freq: Four times a day (QID) | RESPIRATORY_TRACT | 0 refills | Status: AC | PRN

## 2022-06-15 MED ORDER — IPRATROPIUM-ALBUTEROL 0.5-2.5 (3) MG/3ML IN SOLN
RESPIRATORY_TRACT | Status: AC
  Administered 2022-06-15: 3 mL
  Filled 2022-06-15: qty 3

## 2022-06-15 MED ORDER — ALBUTEROL SULFATE (2.5 MG/3ML) 0.083% IN NEBU
2.5000 mg | INHALATION_SOLUTION | Freq: Once | RESPIRATORY_TRACT | Status: AC
  Administered 2022-06-15: 2.5 mg via RESPIRATORY_TRACT

## 2022-06-15 MED ORDER — LISINOPRIL 10 MG PO TABS: 10.0000 mg | ORAL_TABLET | Freq: Every day | ORAL | 2 refills | Status: DC

## 2022-06-15 MED ORDER — IPRATROPIUM-ALBUTEROL 0.5-2.5 (3) MG/3ML IN SOLN
3.0000 mL | Freq: Once | RESPIRATORY_TRACT | Status: AC
  Administered 2022-06-15: 3 mL via RESPIRATORY_TRACT

## 2022-06-15 MED ORDER — IPRATROPIUM-ALBUTEROL 0.5-2.5 (3) MG/3ML IN SOLN: 3.0000 mL | Freq: Once | RESPIRATORY_TRACT | Status: DC

## 2022-06-15 MED ORDER — IPRATROPIUM-ALBUTEROL 0.5-2.5 (3) MG/3ML IN SOLN
RESPIRATORY_TRACT | Status: AC
  Filled 2022-06-15: qty 3

## 2022-06-15 MED ORDER — ALBUTEROL SULFATE (2.5 MG/3ML) 0.083% IN NEBU
INHALATION_SOLUTION | RESPIRATORY_TRACT | Status: AC
  Filled 2022-06-15: qty 3

## 2022-06-15 NOTE — Discharge Instructions (Addendum)
You were seen today for evaluation of shortness of breath.  Your work-up was reassuring for no signs of fluid overload and no newly abnormal lab values.  Please follow-up with your primary care provider as discussed for further evaluation of your wheezes.  I have prescribed a blood pressure medication to take until you can follow-up with your primary care provider.  I recommend follow-up with your primary care provider in 1 week if possible.  I have also prescribed a rescue inhaler to be used as needed as directed.  If your shortness of breath worsens or you develop other life-threatening conditions please return to the emergency department

## 2022-06-15 NOTE — ED Triage Notes (Addendum)
Has been off of BP meds for months due to making her feel sick. Went to UC this am due to cough and wheezing at night. Was found to be hypertensive at [facility]. Was given neb at [facility].

## 2022-06-15 NOTE — ED Provider Notes (Signed)
[facility] EMERGENCY DEPT Provider Note   CSN: [id number] Arrival date & time: 06/15/22  1011     History  Chief Complaint  Patient presents with   Hypertension    Brooke Baldwin is a 46 y.o. female.  Patient presents to the emergency department complaining of a cough and nighttime wheezing. The patient was seen today for the symptoms at urgent care and was noted to be hypertensive.  The patient states she has not been taking her high blood pressure medication for months due to feeling lightheaded and dizzy when taking it.  She was given a nebulizer at urgent care.  She does state that her symptoms were worse this morning.  She states that the nebulizer she received at both urgent care and here at the hospital have not eased her coughing and wheezing.  Past medical history significant for HIV infection, hypertension, tachycardia, end-stage renal disease  HPI     Home Medications Prior to Admission medications   Medication Sig Start Date End Date Taking? Authorizing Provider  albuterol (VENTOLIN HFA) 108 (90 Base) MCG/ACT inhaler Inhale 1-2 puffs into the lungs every 6 (six) hours as needed for wheezing or shortness of breath. 06/15/22  Yes Cherlynn June B, PA-C  lisinopril (ZESTRIL) 10 MG tablet Take 1 tablet (10 mg total) by mouth daily. 06/15/22  Yes Cherlynn June B, PA-C  amLODipine (NORVASC) 10 MG tablet Take 1 tablet (10 mg total) by mouth daily. Patient not taking: Reported on 04/25/2022 12/28/21 12/28/22  Eugenie Filler, MD  atovaquone [facility]) 750 MG/5ML suspension Take 10 mLs (1,500 mg total) by mouth daily. 04/25/22 07/24/22  Mignon Pine, DO  bictegravir-emtricitabine-tenofovir AF (BIKTARVY) 50-200-25 MG TABS tablet Take 1 tablet by mouth at bedtime. 10/12/21 10/07/22  Mignon Pine, DO  ferric citrate (AURYXIA) 1 GM 210 MG(Fe) tablet Take 210-420 mg by mouth 3 (three) times daily with meals. Patient not taking: Reported on 04/25/2022     [provider]  hydrALAZINE (APRESOLINE) 25 MG tablet Take 1 tablet (25 mg total) by mouth 2 (two) times daily. Patient not taking: Reported on 04/25/2022 12/28/21 12/28/22  Eugenie Filler, MD  multivitamin (RENA-VIT) TABS tablet Take 1 tablet by mouth at bedtime. Patient not taking: Reported on 04/25/2022    [provider]  polyethylene glycol powder (GLYCOLAX/MIRALAX) 17 GM/SCOOP powder Take 1 capful with water (17 g) by mouth 2 (two) times daily. Patient not taking: Reported on 04/25/2022 12/28/21   Eugenie Filler, MD  senna-docusate (SENOKOT-S) 8.6-50 MG tablet Take 1 tablet by mouth 2 (two) times daily. Patient not taking: Reported on 04/25/2022 12/28/21   Eugenie Filler, MD  sodium bicarbonate 650 MG tablet Take 1 tablet (650 mg total) by mouth 2 (two) times daily. Patient not taking: Reported on 04/25/2022 12/28/21   Eugenie Filler, MD      Allergies    Lioresal [baclofen], Neurontin [gabapentin], and Penicillins    Review of Systems   Review of Systems  Respiratory:  Positive for cough and wheezing. Negative for shortness of breath.   Cardiovascular:  Negative for chest pain.  Gastrointestinal:  Negative for abdominal pain, nausea and vomiting.    Physical Exam Updated Vital Signs BP (!) 166/101 (BP Location: Right Arm)   Pulse 93   Temp 98.2 F (36.8 C) (Oral)   Resp 18   LMP 04/02/2015 (Approximate)   SpO2 98%  Physical Exam Vitals and nursing note reviewed.  Constitutional:  General: She is not in acute distress.    Appearance: She is well-developed.  HENT:     Head: Normocephalic and atraumatic.  Eyes:     Conjunctiva/sclera: Conjunctivae normal.  Cardiovascular:     Rate and Rhythm: Normal rate and regular rhythm.     Heart sounds: No murmur heard. Pulmonary:     Effort: Pulmonary effort is normal. No respiratory distress.     Breath sounds: Wheezing present.  Abdominal:     Palpations: Abdomen is soft.     Tenderness: There  is no abdominal tenderness.  Musculoskeletal:        General: No swelling.     Cervical back: Neck supple.  Skin:    General: Skin is warm and dry.     Capillary Refill: Capillary refill takes less than 2 seconds.  Neurological:     Mental Status: She is alert.  Psychiatric:        Mood and Affect: Mood normal.     ED Results / Procedures / Treatments   Labs (all labs ordered are listed, but only abnormal results are displayed) Labs Reviewed  CBC WITH DIFFERENTIAL/PLATELET - Abnormal; Notable for the following components:      Result Value   Hemoglobin 15.1 (*)    HCT 46.8 (*)    All other components within normal limits  BASIC METABOLIC PANEL - Abnormal; Notable for the following components:   CO2 15 (*)    BUN 64 (*)    Creatinine, Ser 5.76 (*)    GFR, Estimated 9 (*)    Anion gap 17 (*)    All other components within normal limits  BRAIN NATRIURETIC PEPTIDE    EKG None  Radiology DG Chest 2 View  Result Date: 06/15/2022 CLINICAL DATA:  46 year old female with cough, shortness of breath. Wheezing for 1 month. EXAM: CHEST - 2 VIEW COMPARISON:  Chest radiographs 10/21/2021 and earlier. FINDINGS: Lung volumes and mediastinal contours are normal. Lung markings appear normal today. Both lungs appear clear. No pneumothorax or pleural effusion. Visualized tracheal air column is within normal limits. No acute osseous abnormality identified. Negative visible bowel gas pattern. IMPRESSION: Negative.  No cardiopulmonary abnormality. Electronically Signed   By: Genevie Ann M.D.   On: 06/15/2022 11:19    Procedures Procedures    Medications Ordered in ED Medications  albuterol (PROVENTIL) (2.5 MG/3ML) 0.083% nebulizer solution (  Not Given 06/15/22 1051)  ipratropium-albuterol (DUONEB) 0.5-2.5 (3) MG/3ML nebulizer solution 3 mL (has no administration in time range)  ipratropium-albuterol (DUONEB) 0.5-2.5 (3) MG/3ML nebulizer solution (has no administration in time range)   albuterol (PROVENTIL) (2.5 MG/3ML) 0.083% nebulizer solution 2.5 mg (2.5 mg Nebulization Given 06/15/22 1050)  ipratropium-albuterol (DUONEB) 0.5-2.5 (3) MG/3ML nebulizer solution 3 mL (3 mLs Nebulization Given 06/15/22 1051)  ipratropium-albuterol (DUONEB) 0.5-2.5 (3) MG/3ML nebulizer solution (3 mLs  Given 06/15/22 1528)    ED Course/ Medical Decision Making/ A&P                           Medical Decision Making Amount and/or Complexity of Data Reviewed Labs: ordered. Radiology: ordered.  Risk Prescription drug management.   This patient presents to the ED for concern of wheezes and hypertension, this involves an extensive number of treatment options, and is a complaint that carries with it a high risk of complications and morbidity.  The differential diagnosis includes bronchitis, COPD, asthma, pneumonia, fluid overload, and others   Co morbidities that  complicate the patient evaluation  HIV, ESRD   Additional history obtained:   External records from outside source obtained and reviewed including urgent care notes from this morning   Lab Tests:  I Ordered, and personally interpreted labs.  The pertinent results include: Unremarkable BMP, unremarkable CBC, creatinine 5.76 (baseline for patient)   Imaging Studies ordered:  I ordered imaging studies including chest x-ray I independently visualized and interpreted imaging which showed no acute cardiopulmonary abnormality I agree with the radiologist interpretation   Cardiac Monitoring: / EKG:  The patient was maintained on a cardiac monitor.  I personally viewed and interpreted the cardiac monitored which showed an underlying rhythm of: Sinus rhythm   Problem List / ED Course / Critical interventions / Medication management   I ordered medication including DuoNeb and albuterol for wheezes Reevaluation of the patient after these medicines showed that the patient improved I have reviewed the patients home  medicines and have made adjustments as needed   Social Determinants of Health:  Patient has HIV   Test / Admission - Considered:  The patient is feeling better after nebulized medications.  No pneumonia on chest x-ray.  No signs of fluid overload.  No history of COPD or asthma.  Question possible bronchitis.  Plan to discharge patient home and have her follow-up with primary care.        Final Clinical Impression(s) / ED Diagnoses Final diagnoses:  Uncontrolled hypertension  Wheezes    Rx / DC Orders ED Discharge Orders          Ordered    lisinopril (ZESTRIL) 10 MG tablet  Daily        06/15/22 1510    albuterol (VENTOLIN HFA) 108 (90 Base) MCG/ACT inhaler  Every 6 hours PRN        06/15/22 1520              Ronny Bacon 06/15/22 [place], Ankit, MD 06/16/22 0745

## 2022-07-01 ENCOUNTER — Other Ambulatory Visit: Payer: Self-pay

## 2022-07-01 ENCOUNTER — Emergency Department (HOSPITAL_BASED_OUTPATIENT_CLINIC_OR_DEPARTMENT_OTHER)
Admission: EM | Admit: 2022-07-01 | Discharge: 2022-07-01 | Disposition: A | Discharge status: Home / Self Care | Payer: POS | Attending: Emergency Medicine | Admitting: Emergency Medicine

## 2022-07-01 ENCOUNTER — Emergency Department (HOSPITAL_BASED_OUTPATIENT_CLINIC_OR_DEPARTMENT_OTHER): Payer: POS | Admitting: Radiology

## 2022-07-01 ENCOUNTER — Encounter (HOSPITAL_BASED_OUTPATIENT_CLINIC_OR_DEPARTMENT_OTHER): Payer: Self-pay | Admitting: Emergency Medicine

## 2022-07-01 DIAGNOSIS — Z79899 Other long term (current) drug therapy: Secondary | ICD-10-CM | POA: Insufficient documentation

## 2022-07-01 DIAGNOSIS — Z21 Asymptomatic human immunodeficiency virus [HIV] infection status: Secondary | ICD-10-CM | POA: Insufficient documentation

## 2022-07-01 DIAGNOSIS — N186 End stage renal disease: Secondary | ICD-10-CM | POA: Insufficient documentation

## 2022-07-01 DIAGNOSIS — R0602 Shortness of breath: Secondary | ICD-10-CM | POA: Diagnosis present

## 2022-07-01 DIAGNOSIS — J209 Acute bronchitis, unspecified: Secondary | ICD-10-CM | POA: Insufficient documentation

## 2022-07-01 MED ORDER — IPRATROPIUM-ALBUTEROL 0.5-2.5 (3) MG/3ML IN SOLN
RESPIRATORY_TRACT | Status: AC
  Administered 2022-07-01: 3 mL via RESPIRATORY_TRACT
  Filled 2022-07-01: qty 3

## 2022-07-01 MED ORDER — ALBUTEROL SULFATE (2.5 MG/3ML) 0.083% IN NEBU: 2.5000 mg | INHALATION_SOLUTION | Freq: Once | RESPIRATORY_TRACT | Status: AC

## 2022-07-01 MED ORDER — ALBUTEROL SULFATE HFA 108 (90 BASE) MCG/ACT IN AERS
2.0000 | INHALATION_SPRAY | Freq: Once | RESPIRATORY_TRACT | Status: AC
  Administered 2022-07-01: 2 via RESPIRATORY_TRACT
  Filled 2022-07-01: qty 6.7

## 2022-07-01 MED ORDER — IPRATROPIUM-ALBUTEROL 0.5-2.5 (3) MG/3ML IN SOLN
3.0000 mL | Freq: Once | RESPIRATORY_TRACT | Status: AC
  Administered 2022-07-01: 3 mL via RESPIRATORY_TRACT
  Filled 2022-07-01: qty 3

## 2022-07-01 MED ORDER — PREDNISONE 20 MG PO TABS: 60.0000 mg | ORAL_TABLET | Freq: Every day | ORAL | 0 refills | Status: DC

## 2022-07-01 MED ORDER — PREDNISONE 50 MG PO TABS
60.0000 mg | ORAL_TABLET | Freq: Once | ORAL | Status: AC
  Administered 2022-07-01: 60 mg via ORAL
  Filled 2022-07-01: qty 1

## 2022-07-01 MED ORDER — ALBUTEROL SULFATE (2.5 MG/3ML) 0.083% IN NEBU
INHALATION_SOLUTION | RESPIRATORY_TRACT | Status: AC
  Administered 2022-07-01: 2.5 mg via RESPIRATORY_TRACT
  Filled 2022-07-01: qty 3

## 2022-07-01 MED ORDER — IPRATROPIUM-ALBUTEROL 0.5-2.5 (3) MG/3ML IN SOLN: 3.0000 mL | Freq: Once | RESPIRATORY_TRACT | Status: AC

## 2022-07-01 NOTE — ED Triage Notes (Signed)
Cough ,congestion started Friday. Was here 2 weeks ago and her breathing isnt getting any better. She saw UC last night, received breathing tx that helped and started z pack. Pt states she just having hard time with her breathing.

## 2022-07-01 NOTE — ED Provider Notes (Signed)
[facility] EMERGENCY DEPT Provider Note   CSN: [id number] Arrival date & time: 07/01/22  1414     History  Chief Complaint  Patient presents with   URI    Brooke Baldwin is a 46 y.o. female.  Has a history of end-stage renal disease currently not on dialysis and HIV.  She is complaining of worsening shortness of breath and wheezing keeping her up at night over the last few days.  She went to urgent care and they just prescribe her some Zithromax.  She said her home COVID test was negative.  No fevers or chills no chest pain.   URI Presenting symptoms: cough   Presenting symptoms: no fever   Severity:  Moderate Onset quality:  Gradual Duration:  2 weeks Timing:  Intermittent Progression:  Worsening Chronicity:  New Relieved by:  Nothing Ineffective treatments:  Inhaler Associated symptoms: wheezing   Risk factors: immunosuppression        Home Medications Prior to Admission medications   Medication Sig Start Date End Date Taking? Authorizing Provider  albuterol (VENTOLIN HFA) 108 (90 Base) MCG/ACT inhaler Inhale 1-2 puffs into the lungs every 6 (six) hours as needed for wheezing or shortness of breath. 06/15/22   Dorothyann Peng, PA-C  amLODipine (NORVASC) 10 MG tablet Take 1 tablet (10 mg total) by mouth daily. Patient not taking: Reported on 04/25/2022 12/28/21 12/28/22  Eugenie Filler, MD  atovaquone [facility]) 750 MG/5ML suspension Take 10 mLs (1,500 mg total) by mouth daily. 04/25/22 07/24/22  Mignon Pine, DO  bictegravir-emtricitabine-tenofovir AF (BIKTARVY) 50-200-25 MG TABS tablet Take 1 tablet by mouth at bedtime. 10/12/21 10/07/22  Mignon Pine, DO  ferric citrate (AURYXIA) 1 GM 210 MG(Fe) tablet Take 210-420 mg by mouth 3 (three) times daily with meals. Patient not taking: Reported on 04/25/2022    [provider]  hydrALAZINE (APRESOLINE) 25 MG tablet Take 1 tablet (25 mg total) by mouth 2 (two) times daily. Patient not  taking: Reported on 04/25/2022 12/28/21 12/28/22  Eugenie Filler, MD  lisinopril (ZESTRIL) 10 MG tablet Take 1 tablet (10 mg total) by mouth daily. 06/15/22   Dorothyann Peng, PA-C  multivitamin (RENA-VIT) TABS tablet Take 1 tablet by mouth at bedtime. Patient not taking: Reported on 04/25/2022    [provider]  polyethylene glycol powder (GLYCOLAX/MIRALAX) 17 GM/SCOOP powder Take 1 capful with water (17 g) by mouth 2 (two) times daily. Patient not taking: Reported on 04/25/2022 12/28/21   Eugenie Filler, MD  senna-docusate (SENOKOT-S) 8.6-50 MG tablet Take 1 tablet by mouth 2 (two) times daily. Patient not taking: Reported on 04/25/2022 12/28/21   Eugenie Filler, MD  sodium bicarbonate 650 MG tablet Take 1 tablet (650 mg total) by mouth 2 (two) times daily. Patient not taking: Reported on 04/25/2022 12/28/21   Eugenie Filler, MD      Allergies    Lioresal [baclofen], Neurontin [gabapentin], and Penicillins    Review of Systems   Review of Systems  Constitutional:  Negative for fever.  Respiratory:  Positive for cough and wheezing.   Gastrointestinal:  Negative for abdominal pain.    Physical Exam Updated Vital Signs BP (!) 160/106   Pulse 91   Temp 98 F (36.7 C) (Oral)   Resp 16   LMP 04/02/2015 (Approximate)   SpO2 99%  Physical Exam Vitals and nursing note reviewed.  Constitutional:      General: She is not in acute distress.  Appearance: Normal appearance. She is well-developed.  HENT:     Head: Normocephalic and atraumatic.  Eyes:     Conjunctiva/sclera: Conjunctivae normal.  Cardiovascular:     Rate and Rhythm: Normal rate and regular rhythm.     Heart sounds: No murmur heard. Pulmonary:     Effort: Pulmonary effort is normal. No respiratory distress.     Breath sounds: Wheezing present.  Abdominal:     Palpations: Abdomen is soft.     Tenderness: There is no abdominal tenderness. There is no guarding or rebound.  Musculoskeletal:         General: No deformity.     Cervical back: Neck supple.  Skin:    General: Skin is warm and dry.     Capillary Refill: Capillary refill takes less than 2 seconds.  Neurological:     General: No focal deficit present.     Mental Status: She is alert.     ED Results / Procedures / Treatments   Labs (all labs ordered are listed, but only abnormal results are displayed) Labs Reviewed - No data to display  EKG None  Radiology DG Chest 2 View  Result Date: 07/01/2022 CLINICAL DATA:  Shortness of breath EXAM: CHEST - 2 VIEW COMPARISON:  06/15/2022 FINDINGS: Mild bronchitic changes. No focal airspace disease or effusion. Normal cardiac size. No pneumothorax IMPRESSION: No active cardiopulmonary disease. Electronically Signed   By: Donavan Foil M.D.   On: 07/01/2022 16:16    Procedures Procedures    Medications Ordered in ED Medications  ipratropium-albuterol (DUONEB) 0.5-2.5 (3) MG/3ML nebulizer solution 3 mL (has no administration in time range)  predniSONE (DELTASONE) tablet 60 mg (has no administration in time range)  ipratropium-albuterol (DUONEB) 0.5-2.5 (3) MG/3ML nebulizer solution 3 mL (3 mLs Nebulization Given 07/01/22 1519)  albuterol (PROVENTIL) (2.5 MG/3ML) 0.083% nebulizer solution 2.5 mg (2.5 mg Nebulization Given 07/01/22 1519)    ED Course/ Medical Decision Making/ A&P Clinical Course as of 07/02/22 4235  [facility] Jul 01, 2022  1819 Patient sounds better after breathing treatments.  100% on room air.  She said she had been on dialysis in the past but has been off dialysis for a few months.  She is comfortable plan for discharge.  We will call in a prescription for prednisone, she already has antibiotics to go pick up also. [MB]    Clinical Course User Index [MB] Hayden Rasmussen, MD                           Medical Decision Making Amount and/or Complexity of Data Reviewed Radiology: ordered.  Risk Prescription drug management.   This patient complains of  cough shortness of breath wheezing; this involves an extensive number of treatment Options and is a complaint that carries with it a high risk of complications and morbidity. The differential includes asthma, bronchitis, pneumonia, CHF, hypoxia  I ordered medication oral steroids, inhalational treatments and reviewed PMP when indicated. I ordered imaging studies which included chest x-ray and I independently    visualized and interpreted imaging which showed no acute infiltrates Previous records obtained and reviewed in epic including recent urgent care and ED visits.  Had labs a few weeks ago. Cardiac monitoring reviewed, normal sinus rhythm Social determinants considered, no significant barriers Critical Interventions: None  After the interventions stated above, I reevaluated the patient and found patient to be breathing much more comfortably in no distress.  She is not hypoxic  Admission and further testing considered, no indications for admission or further work-up at this time.  Patient is comfortable plan for discharge on steroids, she already has a prescription for antibiotics.  Recommended close follow-up with PCP.  Return instructions discussed         Final Clinical Impression(s) / ED Diagnoses Final diagnoses:  Acute bronchitis, unspecified organism    Rx / DC Orders ED Discharge Orders          Ordered    predniSONE (DELTASONE) 20 MG tablet  Daily        07/01/22 1831              Hayden Rasmussen, MD 07/02/22 [phone]

## 2022-07-01 NOTE — Discharge Instructions (Signed)
You were seen in the emergency department for cough shortness of breath and wheezing.  Your chest x-ray did not show a definite pneumonia.  Please start the antibiotics that urgent care prescribed you.  We are starting you on some prednisone.  You can also use the albuterol inhaler 2 puffs every 4 hours as needed.  Follow-up with your primary care doctor.  Return to the emergency department if any worsening or concerning symptoms.

## 2022-07-01 NOTE — ED Notes (Signed)
RT note: Pt. seen/assessed per RT Protocol after being notified by Triage RN/staff and is here for >'d SHOB/Wheezing, was given X1 UD Duoneb-(2.'5mg'$  Albuterol/0.'5mg'$  Atrovent) along with X1 UD Atbuterol-(2.'5mg'$ ),  was seen at [facility] yesterday for same.

## 2022-08-22 ENCOUNTER — Emergency Department (HOSPITAL_BASED_OUTPATIENT_CLINIC_OR_DEPARTMENT_OTHER): Payer: Commercial Managed Care - PPO

## 2022-08-22 ENCOUNTER — Encounter (HOSPITAL_BASED_OUTPATIENT_CLINIC_OR_DEPARTMENT_OTHER): Payer: Self-pay | Admitting: Emergency Medicine

## 2022-08-22 ENCOUNTER — Emergency Department (HOSPITAL_BASED_OUTPATIENT_CLINIC_OR_DEPARTMENT_OTHER)
Admission: EM | Admit: 2022-08-22 | Discharge: 2022-08-22 | Disposition: A | Discharge status: Home / Self Care | Payer: Commercial Managed Care - PPO | Attending: Emergency Medicine | Admitting: Emergency Medicine

## 2022-08-22 ENCOUNTER — Other Ambulatory Visit: Payer: Self-pay

## 2022-08-22 DIAGNOSIS — N186 End stage renal disease: Secondary | ICD-10-CM | POA: Diagnosis not present

## 2022-08-22 DIAGNOSIS — R1030 Lower abdominal pain, unspecified: Secondary | ICD-10-CM | POA: Diagnosis present

## 2022-08-22 DIAGNOSIS — I120 Hypertensive chronic kidney disease with stage 5 chronic kidney disease or end stage renal disease: Secondary | ICD-10-CM | POA: Insufficient documentation

## 2022-08-22 DIAGNOSIS — K429 Umbilical hernia without obstruction or gangrene: Secondary | ICD-10-CM | POA: Diagnosis not present

## 2022-08-22 DIAGNOSIS — Z21 Asymptomatic human immunodeficiency virus [HIV] infection status: Secondary | ICD-10-CM | POA: Insufficient documentation

## 2022-08-22 LAB — URINALYSIS, ROUTINE W REFLEX MICROSCOPIC
Bacteria, UA: NONE SEEN
Bilirubin Urine: NEGATIVE
Glucose, UA: NEGATIVE mg/dL
Ketones, ur: NEGATIVE mg/dL
Leukocytes,Ua: NEGATIVE
Nitrite: NEGATIVE
Protein, ur: 100 mg/dL — AB
Specific Gravity, Urine: 1.01 (ref 1.005–1.030)
pH: 6 (ref 5.0–8.0)

## 2022-08-22 LAB — COMPREHENSIVE METABOLIC PANEL
ALT: 17 U/L (ref 0–44)
AST: 27 U/L (ref 15–41)
Albumin: 3.8 g/dL (ref 3.5–5.0)
Alkaline Phosphatase: 201 U/L — ABNORMAL HIGH (ref 38–126)
Anion gap: 15 (ref 5–15)
BUN: 57 mg/dL — ABNORMAL HIGH (ref 6–20)
CO2: 11 mmol/L — ABNORMAL LOW (ref 22–32)
Calcium: 8.7 mg/dL — ABNORMAL LOW (ref 8.9–10.3)
Chloride: 113 mmol/L — ABNORMAL HIGH (ref 98–111)
Creatinine, Ser: 5.76 mg/dL — ABNORMAL HIGH (ref 0.44–1.00)
GFR, Estimated: 9 mL/min — ABNORMAL LOW (ref >60)
Glucose, Bld: 107 mg/dL — ABNORMAL HIGH (ref 70–99)
Potassium: 5.5 mmol/L — ABNORMAL HIGH (ref 3.5–5.1)
Sodium: 139 mmol/L (ref 135–145)
Total Bilirubin: 0.4 mg/dL (ref 0.3–1.2)
Total Protein: 6.5 g/dL (ref 6.5–8.1)

## 2022-08-22 LAB — CBC
HCT: 43.5 % (ref 36.0–46.0)
Hemoglobin: 14.5 g/dL (ref 12.0–15.0)
MCH: 32.4 pg (ref 26.0–34.0)
MCHC: 33.3 g/dL (ref 30.0–36.0)
MCV: 97.1 fL (ref 80.0–100.0)
Platelets: 238 10*3/uL (ref 150–400)
RBC: 4.48 MIL/uL (ref 3.87–5.11)
RDW: 14.5 % (ref 11.5–15.5)
WBC: 6.7 10*3/uL (ref 4.0–10.5)
nRBC: 0 % (ref 0.0–0.2)

## 2022-08-22 LAB — HCG, SERUM, QUALITATIVE: Preg, Serum: NEGATIVE

## 2022-08-22 LAB — LIPASE, BLOOD: Lipase: 90 U/L — ABNORMAL HIGH (ref 11–51)

## 2022-08-22 MED ORDER — DICYCLOMINE HCL 20 MG PO TABS: 20.0000 mg | ORAL_TABLET | Freq: Three times a day (TID) | ORAL | 0 refills | Status: DC | PRN

## 2022-08-22 MED ORDER — MORPHINE SULFATE (PF) 4 MG/ML IV SOLN
4.0000 mg | Freq: Once | INTRAVENOUS | Status: AC
  Administered 2022-08-22: 4 mg via INTRAVENOUS
  Filled 2022-08-22: qty 1

## 2022-08-22 MED ORDER — ONDANSETRON HCL 4 MG/2ML IJ SOLN
4.0000 mg | Freq: Once | INTRAMUSCULAR | Status: AC
  Administered 2022-08-22: 4 mg via INTRAVENOUS
  Filled 2022-08-22: qty 2

## 2022-08-22 NOTE — ED Triage Notes (Signed)
Pt arrives to ED with c/o constant suprapubic abdominal pain x5 days. Pt describes the pain has a cramp.

## 2022-08-22 NOTE — Discharge Instructions (Signed)
You were seen in the emergency room today with abdominal pain.  You appear to have an umbilical hernia as we discussed this is apparently been seen in the past.  I have listed the name of a surgeon as this could be the cause of your pain but he also have uterine fibroids which may be contributing.  Your urine test looked normal.  Please return with any new or suddenly worsening symptoms but I would have you follow closely with your primary care doctor as well as your surgery team.

## 2022-08-22 NOTE — ED Provider Notes (Signed)
Emergency Department Provider Note   I have reviewed the triage vital signs and the nursing notes.   HISTORY  Chief Complaint Abdominal Pain   HPI Brooke Baldwin is a 47 y.o. female presents to the ED lower abdominal pain. Pain is cramping in nature. No nausea or vomiting. No fever. No UTI symptoms.    Past Medical History:  Diagnosis Date   Anemia associated with chronic renal failure    Atrophic kidney    bilateral   CIN I (cervical intraepithelial neoplasia I)    ESRD (end stage renal disease) on dialysis ([place]) 05/2020   hemodialysis  T/ Th/ Sat  @ [facility] ([address] in [place], ( 10-17-2020  s/p left AVG 11/ 2021 unable to use as of yet, currently using right IJ tunneled cath (right side of upper chest) for dialysis   FSGS (focal segmental glomerulosclerosis)    renal biopsy 04-15-2020 in epic , secondary to HTN/ HIV/ Obese   Heart palpitations 11/ 2021  (10-17-2020  pt stated the palpitations does not bother her and has no other symptoms , stated they started 11/ 2021)   pt was been seen by cardiology, dr s. Patria Mane ([facility] in [place]),  per pt had event monitor for 17 days , was told normal with  no arrhythmias,  and stated echo is scheduled for 10-28-2020   History of vulvar dysplasia    HIV (human immunodeficiency virus infection) ([place])    followed by ID-- dr a. wallace   Hypertension    S/P arteriovenous (AV) fistula creation 07-04-2020 '@[facility]    left forearm,  07-28-2020  angioplasty of thrombosis   Secondary hyperparathyroidism of renal origin ([place])    Tachycardia    Thrombocytopenia ([place]) 07/01/2020   Trigeminal neuralgia    ride side ---  neurologist--- dr Krista Blue   (10-17-2020  pt stated last episode about 4 months ago was mild)   VIN II (vulvar intraepithelial neoplasia II)     Review of Systems  Constitutional: No fever/chills Eyes: No visual changes. ENT: No sore throat. Cardiovascular: Denies chest pain. Respiratory: Denies shortness of  breath. Gastrointestinal: Positive lower abdominal pain.  No nausea, no vomiting.  No diarrhea.  No constipation. Genitourinary: Negative for dysuria. Musculoskeletal: Negative for back pain. Skin: Negative for rash. Neurological: Negative for headaches, focal weakness or numbness.  ____________________________________________   PHYSICAL EXAM:  VITAL SIGNS: ED Triage Vitals  Enc Vitals Group     BP 08/22/22 1850 (!) 182/117     Pulse Rate 08/22/22 1850 98     Resp 08/22/22 1850 18     Temp 08/22/22 1850 98.1 F (36.7 C)     Temp src --      SpO2 08/22/22 1850 100 %     Weight 08/22/22 1850 192 lb (87.1 kg)     Height 08/22/22 1850 '5\' 2"'$  (1.575 m)   Constitutional: Alert and oriented. Well appearing and in no acute distress. Eyes: Conjunctivae are normal. Head: Atraumatic. Nose: No congestion/rhinnorhea. Mouth/Throat: Mucous membranes are moist.  Neck: No stridor. Cardiovascular: Normal rate, regular rhythm. Good peripheral circulation. Grossly normal heart sounds.   Respiratory: Normal respiratory effort.  No retractions. Lungs CTAB. Gastrointestinal: Soft and nontender. No distention.  Musculoskeletal: No lower extremity tenderness nor edema. No gross deformities of extremities. Neurologic:  Normal speech and language. No gross focal neurologic deficits are appreciated.  Skin:  Skin is warm, dry and intact. No rash noted.  ____________________________________________   LABS (all labs ordered are listed,  but only abnormal results are displayed)  Labs Reviewed  LIPASE, BLOOD - Abnormal; Notable for the following components:      Result Value   Lipase 90 (*)    All other components within normal limits  COMPREHENSIVE METABOLIC PANEL - Abnormal; Notable for the following components:   Potassium 5.5 (*)    Chloride 113 (*)    CO2 11 (*)    Glucose, Bld 107 (*)    BUN 57 (*)    Creatinine, Ser 5.76 (*)    Calcium 8.7 (*)    Alkaline Phosphatase 201 (*)    GFR,  Estimated 9 (*)    All other components within normal limits  URINALYSIS, ROUTINE W REFLEX MICROSCOPIC - Abnormal; Notable for the following components:   Color, Urine COLORLESS (*)    Hgb urine dipstick SMALL (*)    Protein, ur 100 (*)    All other components within normal limits  CBC  HCG, SERUM, QUALITATIVE    ____________________________________________   PROCEDURES  Procedure(s) performed:   Procedures  None ____________________________________________   INITIAL IMPRESSION / ASSESSMENT AND PLAN / ED COURSE  Pertinent labs & imaging results that were available during my care of the patient were reviewed by me and considered in my medical decision making (see chart for details).   This patient is Presenting for Evaluation of abdominal pain, which does require a range of treatment options, and is a complaint that involves a high risk of morbidity and mortality.  The Differential Diagnoses includes but is not exclusive to ectopic pregnancy, ovarian cyst, ovarian torsion, acute appendicitis, urinary tract infection, endometriosis, bowel obstruction, hernia, colitis, renal colic, gastroenteritis, volvulus etc.    Clinical Laboratory Tests Ordered, included No leukocytosis. Pregnancy negative. Chronic kidney disease.   Radiologic Tests Ordered, included CT abdomen/pelvis. I independently interpreted the images and agree with radiology interpretation.    Medical Decision Making: Summary:  Patient presents to the ED with lower abdominal pain. CT with umbilical hernia but not focally tender. Will refer to general surgery regarding this. No bowel involvement.    Patient's presentation is most consistent with acute illness / injury with system symptoms.   Disposition: discharge  ____________________________________________  FINAL CLINICAL IMPRESSION(S) / ED DIAGNOSES  Final diagnoses:  Lower abdominal pain  Umbilical hernia without obstruction and without gangrene      NEW OUTPATIENT MEDICATIONS STARTED DURING THIS VISIT:  Discharge Medication List as of 08/22/2022 11:16 PM     START taking these medications   Details  dicyclomine (BENTYL) 20 MG tablet Take 1 tablet (20 mg total) by mouth 3 (three) times daily as needed for spasms., Starting Wed 08/22/2022, Normal        Note:  This document was prepared using Dragon voice recognition software and may include unintentional dictation errors.  Nanda Quinton, MD, [facility] Emergency Medicine    Lalaine Overstreet, [facility], MD 08/30/22 [phone]

## 2022-09-18 ENCOUNTER — Telehealth: Payer: Self-pay

## 2022-09-18 NOTE — Telephone Encounter (Signed)
Pt called & left VM - Called pt back and VM box was full. In pt's message she was requesting an appt w/ Dr. Juleen China for some paper work that is in need to be filled out prior to 10/15/2022.

## 2022-09-19 ENCOUNTER — Other Ambulatory Visit: Payer: Self-pay

## 2022-09-19 ENCOUNTER — Encounter: Payer: Self-pay | Admitting: Internal Medicine

## 2022-09-19 ENCOUNTER — Ambulatory Visit (INDEPENDENT_AMBULATORY_CARE_PROVIDER_SITE_OTHER): Payer: Commercial Managed Care - PPO | Admitting: Internal Medicine

## 2022-09-19 VITALS — BP 156/104 | HR 57 | Temp 97.8°F | Wt 200.4 lb

## 2022-09-19 DIAGNOSIS — Z2989 Encounter for other specified prophylactic measures: Secondary | ICD-10-CM | POA: Diagnosis not present

## 2022-09-19 DIAGNOSIS — D071 Carcinoma in situ of vulva: Secondary | ICD-10-CM

## 2022-09-19 DIAGNOSIS — B20 Human immunodeficiency virus [HIV] disease: Secondary | ICD-10-CM | POA: Diagnosis not present

## 2022-09-19 MED ORDER — BIKTARVY 50-200-25 MG PO TABS: 1.0000 | ORAL_TABLET | Freq: Every day | ORAL | 11 refills | Status: DC

## 2022-09-19 NOTE — Assessment & Plan Note (Signed)
She was seen last in Sept 2023.  She continues to do very well on Biktarvy with good virologic control.  Her labs in Sept were undetectable.  Will repeat today and send in refills.  There is no reason from an ID perspective that she cannot undergo surgery.

## 2022-09-19 NOTE — Assessment & Plan Note (Signed)
She underwent vulvar biopsy in Sept 2023 revealing VIN3.  She has been seeing OB/Gyn in [place] to coordinate surgical efforts.  She is scheduled in March for surgical resection with wide local excision of vulva and hysterectomy per the notes from 09/11/22 available in Care Everywhere.

## 2022-09-19 NOTE — Assessment & Plan Note (Signed)
Her CD4 count has shown evidence of immune reconstitution but has been consistently below 200.  She has not been taking Atovaquone consistently in the past.  Overall her risk of PCP is likely low given she is between 100-200 and viral load has consistently been undetectable.  An approach would be to stop prophylaxis in this setting and I think this is reasonable.

## 2022-09-19 NOTE — Progress Notes (Signed)
[facility] Disease   CHIEF COMPLAINT    HIV follow up.    SUBJECTIVE:    Brooke Baldwin is a 46 y.o. female with PMHx as below who presents to the clinic for HIV follow up.   Please see A&P for the details of today's visit and status of the patient's medical problems.   Patient's Medications  New Prescriptions   No medications on file  Previous Medications   ALBUTEROL (VENTOLIN HFA) 108 (90 BASE) MCG/ACT INHALER    Inhale 1-2 puffs into the lungs every 6 (six) hours as needed for wheezing or shortness of breath.   AMLODIPINE (NORVASC) 10 MG TABLET    Take 1 tablet (10 mg total) by mouth daily.   AZITHROMYCIN (ZITHROMAX) 250 MG TABLET    Take by mouth.   DICYCLOMINE (BENTYL) 20 MG TABLET    Take 1 tablet (20 mg total) by mouth 3 (three) times daily as needed for spasms.   FERRIC CITRATE (AURYXIA) 1 GM 210 MG(FE) TABLET    Take 210-420 mg by mouth 3 (three) times daily with meals.   HYDRALAZINE (APRESOLINE) 25 MG TABLET    Take 1 tablet (25 mg total) by mouth 2 (two) times daily.   LISINOPRIL (ZESTRIL) 10 MG TABLET    Take 1 tablet (10 mg total) by mouth daily.   MULTIVITAMIN (RENA-VIT) TABS TABLET    Take 1 tablet by mouth at bedtime.   POLYETHYLENE GLYCOL POWDER (GLYCOLAX/MIRALAX) 17 GM/SCOOP POWDER    Take 1 capful with water (17 g) by mouth 2 (two) times daily.   PREDNISONE (DELTASONE) 20 MG TABLET    Take 3 tablets (60 mg total) by mouth daily.   SENNA-DOCUSATE (SENOKOT-S) 8.6-50 MG TABLET    Take 1 tablet by mouth 2 (two) times daily.   SODIUM BICARBONATE 650 MG TABLET    Take 1 tablet (650 mg total) by mouth 2 (two) times daily.  Modified Medications   Modified Medication Previous Medication   BICTEGRAVIR-EMTRICITABINE-TENOFOVIR AF (BIKTARVY) 50-200-25 MG TABS TABLET bictegravir-emtricitabine-tenofovir AF (BIKTARVY) 50-200-25 MG TABS tablet      Take 1 tablet by mouth at bedtime.    Take 1 tablet by mouth at bedtime.  Discontinued  Medications   No medications on file      Past Medical History:  Diagnosis Date   Anemia associated with chronic renal failure    Atrophic kidney    bilateral   CIN I (cervical intraepithelial neoplasia I)    ESRD (end stage renal disease) on dialysis ([place]) 05/2020   hemodialysis  T/ Th/ Sat  @ [facility] ([address] in [place], ( 10-17-2020  s/p left AVG 11/ 2021 unable to use as of yet, currently using right IJ tunneled cath (right side of upper chest) for dialysis   FSGS (focal segmental glomerulosclerosis)    renal biopsy 04-15-2020 in epic , secondary to HTN/ HIV/ Obese   Heart palpitations 11/ 2021  (10-17-2020  pt stated the palpitations does not bother her and has no other symptoms , stated they started 11/ 2021)   pt was been seen by cardiology, dr s. Patria Mane ([facility] in [place]),  per pt had event monitor for 17 days , was told normal with  no arrhythmias,  and stated echo is scheduled for 10-28-2020   History of vulvar dysplasia    HIV (human immunodeficiency virus infection) ([place])    followed by ID-- dr a. Maxxwell Edgett   Hypertension  S/P arteriovenous (AV) fistula creation 07-04-2020 @[facility]    left forearm,  07-28-2020  angioplasty of thrombosis   Secondary hyperparathyroidism of renal origin ([place])    Tachycardia    Thrombocytopenia ([place]) 07/01/2020   Trigeminal neuralgia    ride side ---  neurologist--- dr Krista Blue   (10-17-2020  pt stated last episode about 4 months ago was mild)   VIN II (vulvar intraepithelial neoplasia II)     Social History   Tobacco Use   Smoking status: Never   Smokeless tobacco: Never  Vaping Use   Vaping Use: Never used  Substance Use Topics   Alcohol use: Not Currently   Drug use: Not Currently    Family History  Problem Relation Age of Onset   Diabetes Mother    Healthy Father    Breast cancer Maternal Aunt    Pancreatic cancer Maternal Grandfather    Colon cancer Neg Hx    Colon polyps Neg Hx    Esophageal cancer Neg Hx    Rectal  cancer Neg Hx    Stomach cancer Neg Hx    Prostate cancer Neg Hx    Endometrial cancer Neg Hx    Ovarian cancer Neg Hx     Allergies  Allergen Reactions   Lioresal [Baclofen] Other (See Comments)    Intolerable dizziness   Neurontin [Gabapentin] Other (See Comments)    Intolerable dizziness   Penicillins Other (See Comments)    Childhood allergy Unknown reaction     Review of Systems  Constitutional: Negative.   Gastrointestinal: Negative.   Genitourinary: Negative.   All other systems reviewed and are negative.    OBJECTIVE:    Vitals:   09/19/22 1046  BP: (!) 156/104  Pulse: (!) 57  Temp: 97.8 F (36.6 C)  TempSrc: Oral  SpO2: 97%  Weight: 200 lb 6.4 oz (90.9 kg)     Body mass index is 36.65 kg/m.  Physical Exam Constitutional:      Appearance: Normal appearance.  Eyes:     Extraocular Movements: Extraocular movements intact.     Conjunctiva/sclera: Conjunctivae normal.  Cardiovascular:     Pulses: Normal pulses.  Pulmonary:     Effort: Pulmonary effort is normal. No respiratory distress.  Musculoskeletal:        General: Normal range of motion.     Cervical back: Normal range of motion and neck supple.  Skin:    General: Skin is warm and dry.  Neurological:     General: No focal deficit present.     Mental Status: She is alert and oriented to person, place, and time.  Psychiatric:        Mood and Affect: Mood normal.        Behavior: Behavior normal.     Labs and Microbiology:    Latest Ref Rng & Units 08/22/2022    6:50 PM 06/15/2022    2:09 PM 04/12/2022    9:39 AM  CMP  Glucose 70 - 99 mg/dL 107  87  79   BUN 6 - 20 mg/dL 57  64  64   Creatinine 0.44 - 1.00 mg/dL 5.76  5.76  5.29   Sodium 135 - 145 mmol/L 139  141  139   Potassium 3.5 - 5.1 mmol/L 5.5  4.7  5.1   Chloride 98 - 111 mmol/L 113  109  114   CO2 22 - 32 mmol/L 11  15  16   $ Calcium 8.9 - 10.3 mg/dL 8.7  9.5  9.0   Total Protein 6.5 - 8.1 g/dL 6.5   6.6   Total Bilirubin  0.3 - 1.2 mg/dL 0.4   0.3   Alkaline Phos 38 - 126 U/L 201     AST 15 - 41 U/L 27   28   ALT 0 - 44 U/L 17   32       Latest Ref Rng & Units 08/22/2022    6:50 PM 06/15/2022    1:00 PM 04/12/2022    9:39 AM  CBC  WBC 4.0 - 10.5 K/uL 6.7  6.0  5.5   Hemoglobin 12.0 - 15.0 g/dL 14.5  15.1  13.4   Hematocrit 36.0 - 46.0 % 43.5  46.8  39.2   Platelets 150 - 400 K/uL 238  218  244      Lab Results  Component Value Date   HIV1RNAQUANT Not Detected 04/12/2022   HIV1RNAQUANT Not Detected 09/27/2021   HIV1RNAQUANT <20 08/13/2021   CD4TABS 136 (L) 04/12/2022   CD4TABS 107 (L) 08/13/2021   CD4TABS 81 (L) 05/04/2021    RPR and STI: Lab Results  Component Value Date   LABRPR NON-REACTIVE 04/12/2022   LABRPR NON-REACTIVE 09/27/2021   LABRPR NON-REACTIVE 05/04/2021   LABRPR NON REACTIVE 07/03/2020    STI Results GC CT  05/04/2021 10:55 AM Negative  Negative     Hepatitis B: Lab Results  Component Value Date   HEPBSAB NON REACTIVE 10/22/2021   HEPBSAG NON REACTIVE 10/22/2021   HEPBCAB NON REACTIVE 07/03/2020   Hepatitis C: No results found for: "HEPCAB", "HCVRNAPCRQN" Hepatitis A: Lab Results  Component Value Date   HAV NON REACTIVE 07/03/2020   Lipids: Lab Results  Component Value Date   CHOL 216 (H) 04/12/2022   TRIG 124 04/12/2022   HDL 76 04/12/2022   CHOLHDL 2.8 04/12/2022   LDLCALC 117 (H) 04/12/2022    Imaging:    ASSESSMENT & PLAN:    HIV (human immunodeficiency virus infection) ([place]) She was seen last in Sept 2023.  She continues to do very well on Biktarvy with good virologic control.  Her labs in Sept were undetectable.  Will repeat today and send in refills.  There is no reason from an ID perspective that she cannot undergo surgery.  Need for pneumocystis prophylaxis Her CD4 count has shown evidence of immune reconstitution but has been consistently below 200.  She has not been taking Atovaquone consistently in the past.  Overall her risk of PCP is  likely low given she is between 100-200 and viral load has consistently been undetectable.  An approach would be to stop prophylaxis in this setting and I think this is reasonable.  VIN III (vulvar intraepithelial neoplasia III) She underwent vulvar biopsy in Sept 2023 revealing VIN3.  She has been seeing OB/Gyn in [place] to coordinate surgical efforts.  She is scheduled in March for surgical resection with wide local excision of vulva and hysterectomy per the notes from 09/11/22 available in Care Everywhere.       Raynelle High[facility] for Infectious Disease [facility] 09/19/2022, 11:01 AM

## 2022-09-20 LAB — T-HELPER CELL (CD4) - (RCID CLINIC ONLY)
CD4 % Helper T Cell: 7 % — ABNORMAL LOW (ref 33–65)
CD4 T Cell Abs: 46 /uL — ABNORMAL LOW (ref 400–1790)

## 2022-09-22 LAB — HIV-1 RNA QUANT-NO REFLEX-BLD
HIV 1 RNA Quant: 1680000 Copies/mL — ABNORMAL HIGH
HIV-1 RNA Quant, Log: 6.23 Log cps/mL — ABNORMAL HIGH

## 2022-09-24 ENCOUNTER — Other Ambulatory Visit (HOSPITAL_COMMUNITY): Payer: Self-pay

## 2022-09-24 ENCOUNTER — Telehealth: Payer: Self-pay

## 2022-09-24 ENCOUNTER — Other Ambulatory Visit: Payer: Self-pay | Admitting: Internal Medicine

## 2022-09-24 MED ORDER — ATOVAQUONE 750 MG/5ML PO SUSP: 1500.0000 mg | Freq: Every day | ORAL | 2 refills | Status: AC

## 2022-09-24 NOTE — Telephone Encounter (Signed)
-----   Message from Mignon Pine, DO sent at 09/24/2022  2:21 PM EST ----- Can you please reach out to patient and see if she has had any issues with adherence or access to [facility]?  She did not express concerns to me, but her viral load has consistently been undetectable and is now greater than 1.6 million copies and her CD 4 count has dropped as well.    I would recommend until better virologic control that she resume PCP prophylaxis with Atovoquone 1553m daily and will send in a new prescription.  [facility] then she needs to come back for resistance testing and look into switching her regimen.    If she has been non-adherent, then please stress adherence and have her come back in 4 weeks for repeat labs.  Thanks, AMitzi Hansen

## 2022-09-24 NOTE — Telephone Encounter (Signed)
Patient aware of results. Patient stated that she hasn't had her medication in a few weeks due to her insurance changing at her job. I contacted pharmacy and medication is ready to be picked up but has a co-pay of $300. I sent a message to [employer] to see if patient could use a co-pay card.   Patient aware to start taking Atovaquone as prophylaxis.   [place], CMA

## 2022-09-27 ENCOUNTER — Other Ambulatory Visit (HOSPITAL_COMMUNITY): Payer: Self-pay

## 2022-09-27 ENCOUNTER — Other Ambulatory Visit: Payer: Self-pay

## 2022-09-27 ENCOUNTER — Other Ambulatory Visit: Payer: Self-pay | Admitting: Pharmacist

## 2022-09-27 ENCOUNTER — Telehealth: Payer: Self-pay

## 2022-09-27 DIAGNOSIS — B20 Human immunodeficiency virus [HIV] disease: Secondary | ICD-10-CM

## 2022-09-27 MED ORDER — BIKTARVY 50-200-25 MG PO TABS
1.0000 | ORAL_TABLET | Freq: Every day | ORAL | 11 refills | Status: DC
  Filled 2022-09-27 (×2): qty 30, 30d supply, fill #0
  Filled 2022-12-04 – 2022-12-19 (×2): qty 30, 30d supply, fill #1
  Filled 2023-01-10: qty 30, 30d supply, fill #2

## 2022-09-27 NOTE — Telephone Encounter (Signed)
[facility] Patient Advocate Encounter   I was successful in securing patient a $5,000.00 grant from [employer] [place] (PAF) to provide copayment coverage for Biktarvy.  This will make the out of pocket cost $0.00.     I have spoken with the patient.    The billing information is as follows and has been shared with [employer].         Patient knows to call the office with questions or concerns.  Ileene Patrick, [facility] Patient [facility] for Infectious Disease Phone: [phone] Fax:  [phone]

## 2022-10-10 ENCOUNTER — Other Ambulatory Visit: Payer: POS

## 2022-10-17 ENCOUNTER — Other Ambulatory Visit (HOSPITAL_COMMUNITY): Payer: Self-pay

## 2022-10-19 ENCOUNTER — Other Ambulatory Visit (HOSPITAL_COMMUNITY): Payer: Self-pay

## 2022-10-24 ENCOUNTER — Ambulatory Visit: Payer: Self-pay | Admitting: Internal Medicine

## 2022-11-07 ENCOUNTER — Other Ambulatory Visit (HOSPITAL_COMMUNITY): Payer: Self-pay

## 2022-11-09 ENCOUNTER — Other Ambulatory Visit (HOSPITAL_COMMUNITY): Payer: Self-pay

## 2022-11-12 ENCOUNTER — Other Ambulatory Visit (HOSPITAL_COMMUNITY): Payer: Self-pay

## 2022-11-14 ENCOUNTER — Other Ambulatory Visit (HOSPITAL_COMMUNITY): Payer: Self-pay

## 2022-12-04 ENCOUNTER — Other Ambulatory Visit (HOSPITAL_COMMUNITY): Payer: Self-pay

## 2022-12-05 ENCOUNTER — Other Ambulatory Visit (HOSPITAL_COMMUNITY): Payer: Self-pay

## 2022-12-14 ENCOUNTER — Other Ambulatory Visit (HOSPITAL_COMMUNITY): Payer: Self-pay

## 2022-12-18 ENCOUNTER — Other Ambulatory Visit (HOSPITAL_COMMUNITY): Payer: Self-pay

## 2022-12-19 ENCOUNTER — Other Ambulatory Visit (HOSPITAL_COMMUNITY): Payer: Self-pay

## 2023-01-01 ENCOUNTER — Other Ambulatory Visit (HOSPITAL_COMMUNITY): Payer: Self-pay

## 2023-01-03 ENCOUNTER — Other Ambulatory Visit: Payer: Self-pay

## 2023-01-04 ENCOUNTER — Other Ambulatory Visit: Payer: Self-pay

## 2023-01-07 ENCOUNTER — Encounter (HOSPITAL_COMMUNITY): Payer: Self-pay

## 2023-01-07 ENCOUNTER — Other Ambulatory Visit (HOSPITAL_COMMUNITY): Payer: Self-pay

## 2023-01-10 ENCOUNTER — Other Ambulatory Visit (HOSPITAL_COMMUNITY): Payer: Self-pay

## 2023-01-10 ENCOUNTER — Other Ambulatory Visit: Payer: Self-pay

## 2023-01-11 ENCOUNTER — Other Ambulatory Visit: Payer: Self-pay

## 2023-01-17 NOTE — Progress Notes (Signed)
The 10-year ASCVD risk score (Arnett DK, et al., 2019) is: 4.9%   Values used to calculate the score:     Age: 47 years     Sex: Female     Is Non-Hispanic African American: Yes     Diabetic: No     Tobacco smoker: No     Systolic Blood Pressure: 159 mmHg     Is BP treated: Yes     HDL Cholesterol: 76 mg/dL     Total Cholesterol: 216 mg/dL  Sandie Ano, RN

## 2023-01-21 ENCOUNTER — Other Ambulatory Visit (HOSPITAL_COMMUNITY): Payer: Self-pay

## 2023-01-22 ENCOUNTER — Other Ambulatory Visit (HOSPITAL_COMMUNITY): Payer: Self-pay

## 2023-01-24 ENCOUNTER — Other Ambulatory Visit (HOSPITAL_COMMUNITY): Payer: Self-pay

## 2023-01-25 ENCOUNTER — Other Ambulatory Visit (HOSPITAL_BASED_OUTPATIENT_CLINIC_OR_DEPARTMENT_OTHER): Payer: Self-pay

## 2023-01-28 ENCOUNTER — Other Ambulatory Visit (HOSPITAL_COMMUNITY): Payer: Self-pay

## 2023-01-31 ENCOUNTER — Other Ambulatory Visit (HOSPITAL_COMMUNITY): Payer: Self-pay

## 2023-02-01 ENCOUNTER — Other Ambulatory Visit (HOSPITAL_COMMUNITY): Payer: Self-pay

## 2023-02-14 ENCOUNTER — Other Ambulatory Visit (HOSPITAL_COMMUNITY): Payer: Self-pay

## 2023-02-21 ENCOUNTER — Other Ambulatory Visit: Payer: Self-pay

## 2023-02-25 ENCOUNTER — Other Ambulatory Visit: Payer: Self-pay

## 2023-02-27 ENCOUNTER — Other Ambulatory Visit (HOSPITAL_COMMUNITY): Payer: Self-pay

## 2023-02-27 ENCOUNTER — Encounter (HOSPITAL_COMMUNITY): Payer: Self-pay

## 2023-03-01 ENCOUNTER — Other Ambulatory Visit: Payer: Self-pay

## 2023-03-12 ENCOUNTER — Other Ambulatory Visit (HOSPITAL_COMMUNITY): Payer: Self-pay

## 2023-03-13 ENCOUNTER — Ambulatory Visit: Payer: 59 | Admitting: Internal Medicine

## 2023-03-13 ENCOUNTER — Ambulatory Visit: Payer: Commercial Managed Care - PPO | Admitting: Internal Medicine

## 2023-03-28 ENCOUNTER — Other Ambulatory Visit (HOSPITAL_COMMUNITY): Payer: Self-pay

## 2023-04-02 ENCOUNTER — Other Ambulatory Visit (HOSPITAL_COMMUNITY): Payer: Self-pay

## 2023-04-04 ENCOUNTER — Other Ambulatory Visit: Payer: Self-pay

## 2023-04-05 ENCOUNTER — Other Ambulatory Visit: Payer: Self-pay

## 2023-04-05 ENCOUNTER — Telehealth: Payer: Self-pay

## 2023-04-05 NOTE — Telephone Encounter (Signed)
Detectable Viral Load Intervention (DVL)  Most recent VL:  HIV 1 RNA Quant  Date Value Ref Range Status  09/19/2022 1,680,000 (H) Copies/mL Final  04/12/2022 Not Detected Copies/mL Final  09/27/2021 Not Detected Copies/mL Final    Last Clinic Visit:  09/24/22  Current ART regimen: Biktarvy  Appointment status: patient does not have future appointment scheduled   Medication last dispensed (per chart review): 04/01/23    Interventions   Called patient to discuss medication adherence and possible barriers to care.   Juanita Laster, RMA

## 2023-04-09 ENCOUNTER — Other Ambulatory Visit (HOSPITAL_COMMUNITY): Payer: Self-pay

## 2023-04-18 NOTE — Telephone Encounter (Signed)
Staff was able to connect w/ patient today.   Confirmed no barrier to care/medication at this time. Transferred to front desk to assist w/ scheduling follow visit with Dr. Luciana Axe.   Staff also received request for medical records from [facility] with signed consent by patient. Patient states this is for a referral for kidney transplant.   The following records sent as requested:   Most recent: Office notes, labs, next appointment, and missed appointment faxed to [phone] Attn: [facility]  Valarie Cones, LPN

## 2023-04-29 ENCOUNTER — Other Ambulatory Visit (HOSPITAL_COMMUNITY): Payer: Self-pay

## 2023-05-01 ENCOUNTER — Ambulatory Visit (INDEPENDENT_AMBULATORY_CARE_PROVIDER_SITE_OTHER): Payer: 59 | Admitting: Internal Medicine

## 2023-05-01 ENCOUNTER — Encounter: Payer: Self-pay | Admitting: Internal Medicine

## 2023-05-01 ENCOUNTER — Other Ambulatory Visit: Payer: Self-pay

## 2023-05-01 ENCOUNTER — Other Ambulatory Visit (HOSPITAL_COMMUNITY): Payer: Self-pay

## 2023-05-01 VITALS — BP 148/85 | HR 76 | Temp 97.8°F | Ht 62.0 in | Wt 195.0 lb

## 2023-05-01 DIAGNOSIS — B20 Human immunodeficiency virus [HIV] disease: Secondary | ICD-10-CM | POA: Diagnosis not present

## 2023-05-01 DIAGNOSIS — Z2989 Encounter for other specified prophylactic measures: Secondary | ICD-10-CM

## 2023-05-01 DIAGNOSIS — Z7185 Encounter for immunization safety counseling: Secondary | ICD-10-CM

## 2023-05-01 DIAGNOSIS — Z113 Encounter for screening for infections with a predominantly sexual mode of transmission: Secondary | ICD-10-CM | POA: Insufficient documentation

## 2023-05-01 DIAGNOSIS — Z23 Encounter for immunization: Secondary | ICD-10-CM

## 2023-05-01 MED ORDER — BIKTARVY 50-200-25 MG PO TABS: 1.0000 | ORAL_TABLET | Freq: Every day | ORAL | 11 refills | Status: DC

## 2023-05-01 NOTE — Assessment & Plan Note (Signed)
Discussed flu and COVID vaccines and given today

## 2023-05-01 NOTE — Assessment & Plan Note (Addendum)
Will screen

## 2023-05-01 NOTE — Assessment & Plan Note (Addendum)
She is doing well now back on treatment for several months.   Will recheck her labs today and she can rtc in 6 months unless concerns.   No history of resistance mutations and ok for Dovato for tenofovir-sparing if preferred by the transplant team.   Refills provided.

## 2023-05-01 NOTE — Assessment & Plan Note (Signed)
Low CD4 count and can restart atovaquone but will see what the CD4 is and if her viral load is suppressed before deciding.

## 2023-05-01 NOTE — Progress Notes (Signed)
Subjective:    Patient ID: Brooke Baldwin, female    DOB: Jul 12, 1976, 47 y.o.   MRN: [id number]  HPI Brooke Baldwin is here for follow up of HIV She continues on [place] and denies any missed doses.  Last visit in February she had been off of medications and her viral load was up over 1 million and CD4 down to 46.  She reported later that her insurance changed and she was unable to get the medication.  She though is back on medications now since that time and no missed doses.  No new concerns.  She is undergoing a transplant evaluation at [facility].  Has had a persistently low CD4.     Review of Systems  Constitutional:  Negative for fatigue.  Gastrointestinal:  Negative for diarrhea and nausea.  Skin:  Negative for rash.       Objective:   Physical Exam Eyes:     General: No scleral icterus. Pulmonary:     Effort: Pulmonary effort is normal.  Neurological:     Mental Status: She is alert.   SH: no tobacco        Assessment & Plan:

## 2023-05-02 ENCOUNTER — Other Ambulatory Visit: Payer: Self-pay | Admitting: Pharmacist

## 2023-05-02 LAB — RPR: RPR Ser Ql: NONREACTIVE

## 2023-05-03 ENCOUNTER — Other Ambulatory Visit: Payer: Self-pay

## 2023-05-03 LAB — HIV-1 RNA QUANT-NO REFLEX-BLD
HIV 1 RNA Quant: NOT DETECTED {copies}/mL
HIV-1 RNA Quant, Log: NOT DETECTED {Log}

## 2023-05-03 LAB — T-HELPER CELL (CD4) - (RCID CLINIC ONLY)
CD4 % Helper T Cell: 12 % — ABNORMAL LOW (ref 33–65)
CD4 T Cell Abs: 134 /uL — ABNORMAL LOW (ref 400–1790)

## 2023-07-13 ENCOUNTER — Encounter (HOSPITAL_BASED_OUTPATIENT_CLINIC_OR_DEPARTMENT_OTHER): Payer: Self-pay | Admitting: Emergency Medicine

## 2023-07-13 ENCOUNTER — Other Ambulatory Visit: Payer: Self-pay

## 2023-07-13 ENCOUNTER — Emergency Department (HOSPITAL_BASED_OUTPATIENT_CLINIC_OR_DEPARTMENT_OTHER): Payer: 59 | Admitting: Radiology

## 2023-07-13 ENCOUNTER — Emergency Department (HOSPITAL_BASED_OUTPATIENT_CLINIC_OR_DEPARTMENT_OTHER)
Admission: EM | Admit: 2023-07-13 | Discharge: 2023-07-13 | Disposition: A | Discharge status: Home / Self Care | Payer: 59 | Attending: Emergency Medicine | Admitting: Emergency Medicine

## 2023-07-13 DIAGNOSIS — M25571 Pain in right ankle and joints of right foot: Secondary | ICD-10-CM | POA: Insufficient documentation

## 2023-07-13 DIAGNOSIS — N189 Chronic kidney disease, unspecified: Secondary | ICD-10-CM | POA: Diagnosis not present

## 2023-07-13 DIAGNOSIS — M25561 Pain in right knee: Secondary | ICD-10-CM | POA: Diagnosis present

## 2023-07-13 DIAGNOSIS — Y92009 Unspecified place in unspecified non-institutional (private) residence as the place of occurrence of the external cause: Secondary | ICD-10-CM | POA: Diagnosis not present

## 2023-07-13 DIAGNOSIS — B20 Human immunodeficiency virus [HIV] disease: Secondary | ICD-10-CM | POA: Insufficient documentation

## 2023-07-13 DIAGNOSIS — M25532 Pain in left wrist: Secondary | ICD-10-CM | POA: Diagnosis not present

## 2023-07-13 DIAGNOSIS — W1839XA Other fall on same level, initial encounter: Secondary | ICD-10-CM | POA: Diagnosis not present

## 2023-07-13 DIAGNOSIS — Y990 Civilian activity done for income or pay: Secondary | ICD-10-CM | POA: Diagnosis not present

## 2023-07-13 DIAGNOSIS — W19XXXA Unspecified fall, initial encounter: Secondary | ICD-10-CM

## 2023-07-13 NOTE — Discharge Instructions (Addendum)
Tylenol for pain  Follow-up outpatient with orthopedics if her symptoms do not improve  Return for any worsening symptoms

## 2023-07-13 NOTE — ED Provider Notes (Signed)
Brooke Baldwin Provider Note   CSN: [id number] Arrival date & time: 07/13/23  1151    History  Chief Complaint  Patient presents with   Brooke Baldwin    Brooke Baldwin is a 47 y.o. female here for HIV on Biktarvy, CKD not on dialysis currently here for evaluation mechanical fall.  Fell when she got home at work at around 5 AM.  Denies any head, LOC anticoagulation.  She has pain to her left wrist, right knee, right ankle.  She states she has been "hopping" along due to the pain.  She has noted swelling to her right knee.  No numbness or weakness.  No breaks in skin.  Her tetanus is up-to-date. Getting peritoneal dialysis catheter.  Patient states "a long time ago" she had dialysis catheter in her left forearm which subsequently failed and was not able to be used. Has cath placed. Not currently undergoing dialysis. Under Nephrology surveillance and ID.  HPI     Home Medications Prior to Admission medications   Medication Sig Start Date End Date Taking? Authorizing Provider  albuterol (VENTOLIN HFA) 108 (90 Base) MCG/ACT inhaler Inhale 1-2 puffs into the lungs every 6 (six) hours as needed for wheezing or shortness of breath. 06/15/22   Darrick Grinder, PA-C  amLODipine (NORVASC) 10 MG tablet Take 1 tablet (10 mg total) by mouth daily. 12/28/21 05/01/23  Rodolph Bong, MD  bictegravir-emtricitabine-tenofovir AF (BIKTARVY) 50-200-25 MG TABS tablet Take 1 tablet by mouth daily. 05/01/23 04/25/24  Gardiner Barefoot, MD  sodium bicarbonate 650 MG tablet Take 1 tablet (650 mg total) by mouth 2 (two) times daily. 12/28/21   Rodolph Bong, MD      Allergies    Lioresal [baclofen], Neurontin [gabapentin], and Penicillins    Review of Systems   Review of Systems  Constitutional: Negative.   HENT: Negative.    Respiratory: Negative.    Cardiovascular: Negative.   Gastrointestinal: Negative.   Genitourinary: Negative.   Musculoskeletal:  Negative  for arthralgias, back pain, gait problem, joint swelling, myalgias, neck pain and neck stiffness.       Right knee, ankle and left wrist pain  Skin: Negative.   Neurological: Negative.   All other systems reviewed and are negative.   Physical Exam Updated Vital Signs BP (!) 167/102   Pulse 70   Temp 97.7 F (36.5 C)   Resp 16   Ht 5\' 2"  (1.575 m)   Wt 88.5 kg   LMP 04/02/2015 (Approximate)   SpO2 99%   BMI 35.69 kg/m  Physical Exam Vitals and nursing note reviewed.  Constitutional:      General: She is not in acute distress.    Appearance: She is well-developed. She is not ill-appearing, toxic-appearing or diaphoretic.  HENT:     Head: Normocephalic and atraumatic.     Nose: Nose normal.     Mouth/Throat:     Mouth: Mucous membranes are moist.  Eyes:     Pupils: Pupils are equal, round, and reactive to light.  Cardiovascular:     Rate and Rhythm: Normal rate.     Pulses: Normal pulses.          Dorsalis pedis pulses are 2+ on the right side and 2+ on the left side.     Heart sounds: Normal heart sounds.  Pulmonary:     Effort: Pulmonary effort is normal. No respiratory distress.     Breath sounds: Normal breath sounds.  Abdominal:     General: Bowel sounds are normal. There is no distension.     Palpations: Abdomen is soft.  Musculoskeletal:        General: Swelling, tenderness and signs of injury present. Normal range of motion.     Cervical back: Normal range of motion.     Comments: No midline C/T/L tenderness. Mild diffuse tenderness to left wrist. No scaphoid tenderness. Non tender forearm, hand. Non tender RUE. Nontender LLE. Tenderness to right knee with overlying effusion. No lacerations, Able to flex and extend. Tenderness to right ankle, able to flex and extend. Old dialysis graft to left forearm>> Not working per patient  Skin:    General: Skin is warm and dry.     Capillary Refill: Capillary refill takes less than 2 seconds.     Comments: No lacerations   Neurological:     General: No focal deficit present.     Mental Status: She is alert.  Psychiatric:        Mood and Affect: Mood normal.    ED Results / Procedures / Treatments   Labs (all labs ordered are listed, but only abnormal results are displayed) Labs Reviewed - No data to display  EKG None  Radiology DG Ankle Complete Right  Result Date: 07/13/2023 CLINICAL DATA:  Fall with ankle pain. EXAM: RIGHT ANKLE - COMPLETE 3+ VIEW COMPARISON:  None Available. FINDINGS: The patient is status post open reduction internal fixation with plate, screws, and pin placement within the lateral and medial malleolus. There is no evidence of fracture, dislocation, or joint effusion. There is no evidence of arthropathy or other focal bone abnormality. Soft tissues are unremarkable. IMPRESSION: No acute fracture or dislocation. Electronically Signed   By: Romona Curls M.D.   On: 07/13/2023 13:11   DG Knee Complete 4 Views Right  Result Date: 07/13/2023 CLINICAL DATA:  Fall with knee pain. EXAM: RIGHT KNEE - COMPLETE 4+ VIEW COMPARISON:  None Available. FINDINGS: No evidence of fracture or dislocation. There is a moderate knee joint effusion. A superior patellar enthesophyte is noted. Soft tissues are unremarkable. IMPRESSION: Moderate knee joint effusion without evidence of fracture. Electronically Signed   By: Romona Curls M.D.   On: 07/13/2023 13:10   DG Wrist Complete Left  Result Date: 07/13/2023 CLINICAL DATA:  Fall with wrist pain. EXAM: LEFT WRIST - COMPLETE 3+ VIEW COMPARISON:  None Available. FINDINGS: There is no evidence of fracture or dislocation. There is no evidence of arthropathy or other focal bone abnormality. There is mild soft tissue swelling around the wrist. IMPRESSION: No acute fracture or dislocation. Electronically Signed   By: Romona Curls M.D.   On: 07/13/2023 13:09    Procedures .Ortho Injury Treatment  Date/Time: 07/13/2023 3:25 PM  Performed by: Ralph Leyden A,  PA-C Authorized by: Linwood Dibbles, PA-C   Consent:    Consent obtained:  Verbal   Consent given by:  Patient   Risks discussed:  Fracture, nerve damage, restricted joint movement, vascular damage, stiffness, recurrent dislocation and irreducible dislocation   Alternatives discussed:  No treatment, alternative treatment, immobilization, referral and delayed treatmentInjury location: knee Location details: right knee Injury type: soft tissue Pre-procedure neurovascular assessment: neurovascularly intact Pre-procedure distal perfusion: normal Pre-procedure neurological function: normal Pre-procedure range of motion: normal  Anesthesia: Local anesthesia used: no  Patient sedated: NoImmobilization: crutches and brace Splint type: knee immobilizer. Splint Applied by: ED Tech Post-procedure neurovascular assessment: post-procedure neurovascularly intact Post-procedure distal perfusion: normal Post-procedure neurological function:  normal Post-procedure range of motion: normal       Medications Ordered in ED Medications - No data to display  ED Course/ Medical Decision Making/ A&P   47 year old here for evaluation mechanical fall which occurred this morning.  She denies hitting head, LOC or anticoagulation.  She has pain to her left wrist, right knee and right ankle.  Neurovascularly intact.  No lacerations.  No midline C/T/L tenderness.  She has history of HIV, CKD, previously on dialysis however not currently due to some improvement in her kidney function.  Actually sounds like she is going to be beginning the dialysis process and has plans for PD cath with [facility].  She was seen by them less than a week ago.  She has an old dialysis cath to her left forearm which she states has been nonfunctioning for years.  She has some mild diffuse tenderness to wrist, no scaphoid pain.  Neurovascularly intact.  She has some soft tissue swelling to her right knee.  Plan on x-ray and  reassess  Imaging personally viewed interpreted No fracture, dislocation, right knee does show joint effusion which is consistent with exam.  Suspect likely traumatic I low suspicion for septic joint, hemarthrosis  We discussed results with patient.  She feels like her knee is "giving out" and locking up when she walks.  Will place in knee support, crutches.  Discussed Tylenol, ice and elevation.  Referred her outpatient to orthopedics if her symptoms do not improve.  The patient has been appropriately medically screened and/or stabilized in the ED. I have low suspicion for any other emergent medical condition which would require further screening, evaluation or treatment in the ED or require inpatient management.  Patient is hemodynamically stable and in no acute distress.  Patient able to ambulate in department prior to ED.  Evaluation does not show acute pathology that would require ongoing or additional emergent interventions while in the emergency department or further inpatient treatment.  I have discussed the diagnosis with the patient and answered all questions.  Pain is been managed while in the emergency department and patient has no further complaints prior to discharge.  Patient is comfortable with plan discussed in room and is stable for discharge at this time.  I have discussed strict return precautions for returning to the emergency department.  Patient was encouraged to follow-up with PCP/specialist refer to at discharge.                                 Medical Decision Making Amount and/or Complexity of Data Reviewed External Data Reviewed: labs, radiology and notes. Radiology: ordered and independent interpretation performed. Decision-making details documented in ED Course.  Risk OTC drugs. Prescription drug management. Decision regarding hospitalization. Diagnosis or treatment significantly limited by social determinants of health.          Final Clinical  Impression(s) / ED Diagnoses Final diagnoses:  Fall, initial encounter  Acute pain of right knee    Rx / DC Orders ED Discharge Orders     None         Ramiah Helfrich A, PA-C 07/13/23 1541    Alvira Monday, MD 07/14/23 2246

## 2023-07-13 NOTE — ED Triage Notes (Signed)
Pt via pov from home with left wrist, right knee, right ankle pain after a fall this morning at 530. Pt states fall was mechanical; denies hitting her head or LOC. Pt reports trying to break the fall, resulting in the injuries. Pt alert & oriented, nad noted.

## 2023-07-24 NOTE — Progress Notes (Unsigned)
Office Visit Note   Patient: Brooke Baldwin           Date of Birth: 1976-01-06           MRN: [id number] Visit Date: 07/25/2023              Requested by: No referring provider defined for this encounter. PCP: System, Provider Not In   Assessment & Plan: Visit Diagnoses: No diagnosis found.  Plan: ***  Follow-Up Instructions: No follow-ups on file.   Orders:  No orders of the defined types were placed in this encounter.  No orders of the defined types were placed in this encounter.     Procedures: No procedures performed   Clinical Data: No additional findings.   Subjective: No chief complaint on file.   HPI  Review of Systems  Musculoskeletal:  Positive for arthralgias.    Objective: Vital Signs: LMP 04/02/2015 (Approximate)   Physical Exam Vitals and nursing note reviewed.  Constitutional:      Appearance: She is well-developed.  HENT:     Head: Atraumatic.     Nose: Nose normal.  Eyes:     Extraocular Movements: Extraocular movements intact.  Cardiovascular:     Pulses: Normal pulses.  Pulmonary:     Effort: Pulmonary effort is normal.  Abdominal:     Palpations: Abdomen is soft.  Musculoskeletal:     Cervical back: Neck supple.  Skin:    General: Skin is warm.     Capillary Refill: Capillary refill takes less than 2 seconds.  Neurological:     Mental Status: She is alert. Mental status is at baseline.  Psychiatric:        Behavior: Behavior normal.        Thought Content: Thought content normal.        Judgment: Judgment normal.   Ortho Exam  Specialty Comments:  No specialty comments available.  Imaging: No results found.   PMFS History: Patient Active Problem List   Diagnosis Date Noted  . Routine screening for STI (sexually transmitted infection) 05/01/2023  . Uterine fibroid 12/27/2021  . History of difficult venous access 12/27/2021  . Immunosuppressed status (HCC)   . Hyperkalemia 08/12/2021  . Adnexal mass  05/31/2021  . 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 Class 1 obesity due to excess calories with serious comorbidity and body mass index (BMI) of 31.0 to 31.9 in adult 09/08/2020  . Hypertension 09/08/2020  . Need for pneumocystis prophylaxis 07/27/2020  . Vaccine counseling 07/27/2020  . Metabolic acidosis 07/01/2020  . Hypoalbuminemia 07/01/2020  . Secondary hyperparathyroidism (HCC) 07/01/2020  . HIV (human immunodeficiency virus infection) (HCC) 06/30/2020  . End stage renal disease (HCC) 06/30/2020  . Elevated blood pressure reading without diagnosis of hypertension 06/30/2020  . Right trigeminal neuralgia 12/09/2019   Past Medical History:  Diagnosis Date  . Anemia associated with chronic renal failure   . Atrophic kidney    bilateral  . CIN I (cervical intraepithelial neoplasia I)   . ESRD (end stage renal disease) on dialysis [facility]) 05/2020   hemodialysis  T/ Th/ Sat  @ [facility] ([address] in [place], ( 10-17-2020  s/p left AVG 11/ 2021 unable to use as of yet, currently using right IJ tunneled cath (right side of upper chest) for dialysis  . FSGS (focal segmental glomerulosclerosis)    renal biopsy 04-15-2020 in epic , secondary to HTN/ HIV/ Obese  . Heart palpitations 11/ 2021  (10-17-2020  pt stated  the palpitations does not bother her and has no other symptoms , stated they started 11/ 2021)   pt was been seen by cardiology, dr s. Nicanor Bake ([facility] in [place]),  per pt had event monitor for 17 days , was told normal with  no arrhythmias,  and stated echo is scheduled for 10-28-2020  . History of vulvar dysplasia   . HIV (human immunodeficiency virus infection) (HCC)    followed by ID-- dr a. wallace  . Hypertension   . S/P arteriovenous (AV) fistula creation 07-04-2020 @[facility]    left forearm,  07-28-2020  angioplasty of thrombosis  . Secondary hyperparathyroidism of renal origin (HCC)   . Tachycardia   . Thrombocytopenia (HCC) 07/01/2020  .  Trigeminal neuralgia    ride side ---  neurologist--- dr Terrace Arabia   (10-17-2020  pt stated last episode about 4 months ago was mild)  . VIN II (vulvar intraepithelial neoplasia II)     Family History  Problem Relation Age of Onset  . Diabetes Mother   . Healthy Father   . Breast cancer Maternal Aunt   . Pancreatic cancer Maternal Grandfather   . Colon cancer Neg Hx   . Colon polyps Neg Hx   . Esophageal cancer Neg Hx   . Rectal cancer Neg Hx   . Stomach cancer Neg Hx   . Prostate cancer Neg Hx   . Endometrial cancer Neg Hx   . Ovarian cancer Neg Hx     Past Surgical History:  Procedure Laterality Date  . A/V FISTULAGRAM Left 07/28/2020   Procedure: A/V FISTULAGRAM;  Surgeon: Cephus Shelling, MD;  Location: [facility] INVASIVE CV LAB;  Service: Cardiovascular;  Laterality: Left;  . AV FISTULA PLACEMENT Left 07/04/2020   Procedure: INSERTION OF LEFT ARTERIOVENOUS (AV) GORE-TEX GRAFT ARM (ACUSEAL);  Surgeon: Leonie Douglas, MD;  Location: [facility] OR;  Service: Vascular;  Laterality: Left;  . CERVICAL BIOPSY  W/ LOOP ELECTRODE EXCISION  2005  . CO2 LASER APPLICATION N/A 12/22/2020   Procedure: CO2 LASER APPLICATION;  Surgeon: Adolphus Birchwood, MD;  [facility];  Service: Gynecology;  Laterality: N/A;  . INSERTION OF DIALYSIS CATHETER N/A 08/14/2021   Procedure: TUNNELED DIALYSIS CATHETER EXCHANGE;  Surgeon: Chuck Hint, MD;  Location: [facility] OR;  Service: Vascular;  Laterality: N/A;  . ORIF ANKLE FRACTURE Right 2012   per pt retained hardware  . PERIPHERAL VASCULAR BALLOON ANGIOPLASTY Left 07/28/2020   Procedure: PERIPHERAL VASCULAR BALLOON ANGIOPLASTY;  Surgeon: Cephus Shelling, MD;  Location: [facility] INVASIVE CV LAB;  Service: Cardiovascular;  Laterality: Left;  AVF  . VULVA SURGERY  2013   excision   Social History   Occupational History  . Occupation: Physicist, medical  Tobacco Use  . Smoking status: Never  . Smokeless tobacco: Never  Vaping Use  . Vaping status:  Never Used  Substance and Sexual Activity  . Alcohol use: Not Currently  . Drug use: Not Currently  . Sexual activity: Not Currently    Birth control/protection: Post-menopausal    Comment: declined condoms

## 2023-07-25 ENCOUNTER — Ambulatory Visit: Payer: 59 | Admitting: Orthopaedic Surgery

## 2023-07-25 ENCOUNTER — Encounter: Payer: Self-pay | Admitting: Orthopaedic Surgery

## 2023-07-25 DIAGNOSIS — M25561 Pain in right knee: Secondary | ICD-10-CM | POA: Diagnosis not present

## 2023-07-25 DIAGNOSIS — M25532 Pain in left wrist: Secondary | ICD-10-CM

## 2023-07-25 MED ORDER — PREDNISONE 10 MG (21) PO TBPK
ORAL_TABLET | ORAL | 3 refills | Status: DC
Start: 2023-07-25 — End: 2024-06-26

## 2023-10-01 ENCOUNTER — Other Ambulatory Visit: Payer: Self-pay

## 2023-11-11 NOTE — Progress Notes (Unsigned)
 HPI: Brooke Baldwin is a 48 y.o. female who presents to the [facility] pharmacy clinic for HIV follow-up.  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   Class 1 obesity due to excess calories with serious comorbidity and body mass index (BMI) of 31.0 to 31.9 in adult 09/08/2020   Hypertension 09/08/2020   Need for pneumocystis prophylaxis 07/27/2020   Vaccine counseling 07/27/2020   Metabolic acidosis 07/01/2020   Hypoalbuminemia 07/01/2020   Secondary hyperparathyroidism (HCC) 07/01/2020   HIV (human immunodeficiency virus infection) (HCC) 06/30/2020   End stage renal disease (HCC) 06/30/2020   Elevated blood pressure reading without diagnosis of hypertension 06/30/2020   Right trigeminal neuralgia 12/09/2019    Patient's Medications  New Prescriptions   No medications on file  Previous Medications   ALBUTEROL (VENTOLIN HFA) 108 (90 BASE) MCG/ACT INHALER    Inhale 1-2 puffs into the lungs every 6 (six) hours as needed for wheezing or shortness of breath.   AMLODIPINE (NORVASC) 10 MG TABLET    Take 1 tablet (10 mg total) by mouth daily.   BICTEGRAVIR-EMTRICITABINE-TENOFOVIR AF (BIKTARVY) 50-200-25 MG TABS TABLET    Take 1 tablet by mouth daily.   PREDNISONE (STERAPRED UNI-PAK 21 TAB) 10 MG (21) TBPK TABLET    Take as directed   SODIUM BICARBONATE 650 MG TABLET    Take 1 tablet (650 mg total) by mouth 2 (two) times daily.  Modified Medications   No medications on file  Discontinued Medications   No medications on file    Labs: Lab Results  Component Value Date   HIV1RNAQUANT Not Detected 05/01/2023   HIV1RNAQUANT 1,680,000 (H) 09/19/2022   HIV1RNAQUANT Not Detected  04/12/2022   CD4TABS 134 (L) 05/01/2023   CD4TABS 46 (L) 09/19/2022   CD4TABS 136 (L) 04/12/2022    RPR and STI Lab Results  Component Value Date   LABRPR NON-REACTIVE 05/01/2023   LABRPR NON-REACTIVE 04/12/2022   LABRPR NON-REACTIVE 09/27/2021   LABRPR NON-REACTIVE 05/04/2021   LABRPR NON REACTIVE 07/03/2020    STI Results GC CT  05/04/2021 10:55 AM Negative  Negative     Hepatitis B Lab Results  Component Value Date   HEPBSAB NON REACTIVE 10/22/2021   HEPBSAG NON REACTIVE 10/22/2021   HEPBCAB NON REACTIVE 07/03/2020   Hepatitis C No results found for: "HEPCAB", "HCVRNAPCRQN" Hepatitis A Lab Results  Component Value Date   HAV NON REACTIVE 07/03/2020   Lipids: Lab Results  Component Value Date   CHOL 216 (H) 04/12/2022   TRIG 124 04/12/2022   HDL 76 04/12/2022   CHOLHDL 2.8 04/12/2022   LDLCALC 117 (H) 04/12/2022    Current HIV Regimen: Biktarvy once daily   Assessment: Brooke Baldwin is a 48 y/o female presenting for HIV therapy follow-up. Last HIV RNA was undetectable and last CD4 was 135 in 04/2023. Brooke Baldwin remain on Biktarvy with no issues noted. Reports no missed doses. Has about 10 tablets of Biktarvy left. Brooke Baldwin have a history of atovaquone use for PJP prophylaxis but has not taken in a while. Will recheck CD4 count today before deciding on restarting. Brooke Baldwin also have a history of CKD and was previously on HD but per patient has not been on HD since 2023,  plans for PD in the next coming weeks. Will recheck BMP today and can consider switching to Dovato pending renal function.  Immunizations: Brooke Baldwin are due for Menveo, shingles, and tetanus vaccinations. Brooke Baldwin agree to receiving menveo today. Discussed talking about shingles vacccinations with Brooke Baldwin pharmacy. Brooke Baldwin would like to speak with their provider to see if Brooke Baldwin are due for the tetanus vaccination before receiving. Previous HAV antibody negative in 2021 and HBV sAB negative in 2023. Has received both immunizations since.  Will recheck antibodies today.  Plan: -HIV RNA, CD4 count, lipid panel, and BMP -Hep A and B antibodies -Menveo given in R deltoid muscle -Medication refills pending labs results -F/u on 03/16/2024 with Dr. Thedore Mins -Call with any questions or concerns  Thank you for involving pharmacy in the patient's care.   Theotis Burrow, PharmD PGY1 Acute Care Pharmacy Resident  11/12/2023 10:02 AM

## 2023-11-12 ENCOUNTER — Ambulatory Visit (INDEPENDENT_AMBULATORY_CARE_PROVIDER_SITE_OTHER): Payer: 59 | Admitting: Pharmacist

## 2023-11-12 ENCOUNTER — Other Ambulatory Visit: Payer: Self-pay

## 2023-11-12 DIAGNOSIS — Z23 Encounter for immunization: Secondary | ICD-10-CM

## 2023-11-12 DIAGNOSIS — Z21 Asymptomatic human immunodeficiency virus [HIV] infection status: Secondary | ICD-10-CM

## 2023-11-12 DIAGNOSIS — Z1159 Encounter for screening for other viral diseases: Secondary | ICD-10-CM

## 2023-11-15 ENCOUNTER — Telehealth: Payer: Self-pay

## 2023-11-15 ENCOUNTER — Other Ambulatory Visit: Payer: Self-pay

## 2023-11-15 DIAGNOSIS — Z21 Asymptomatic human immunodeficiency virus [HIV] infection status: Secondary | ICD-10-CM

## 2023-11-15 LAB — BASIC METABOLIC PANEL WITH GFR
BUN/Creatinine Ratio: 9 (calc) (ref 6–22)
BUN: 82 mg/dL — ABNORMAL HIGH (ref 7–25)
CO2: 14 mmol/L — ABNORMAL LOW (ref 20–32)
Calcium: 9.4 mg/dL (ref 8.6–10.2)
Chloride: 116 mmol/L — ABNORMAL HIGH (ref 98–110)
Creat: 8.68 mg/dL — ABNORMAL HIGH (ref 0.50–0.99)
Glucose, Bld: 70 mg/dL (ref 65–99)
Potassium: 4.8 mmol/L (ref 3.5–5.3)
Sodium: 143 mmol/L (ref 135–146)
eGFR: 5 mL/min/{1.73_m2} — ABNORMAL LOW (ref >=60)

## 2023-11-15 LAB — LIPID PANEL
Cholesterol: 235 mg/dL — ABNORMAL HIGH (ref <200)
HDL: 67 mg/dL (ref >=50)
LDL Cholesterol (Calc): 142 mg/dL — ABNORMAL HIGH
Non-HDL Cholesterol (Calc): 168 mg/dL — ABNORMAL HIGH (ref <130)
Total CHOL/HDL Ratio: 3.5 (calc) (ref <5.0)
Triglycerides: 139 mg/dL (ref <150)

## 2023-11-15 LAB — HEPATITIS B SURFACE ANTIBODY,QUALITATIVE: Hep B S Ab: NONREACTIVE

## 2023-11-15 LAB — HIV-1 RNA QUANT-NO REFLEX-BLD
HIV 1 RNA Quant: NOT DETECTED {copies}/mL
HIV-1 RNA Quant, Log: NOT DETECTED {Log_copies}/mL

## 2023-11-15 LAB — HEPATITIS A ANTIBODY, TOTAL: Hepatitis A AB,Total: NONREACTIVE

## 2023-11-15 MED ORDER — DOVATO 50-300 MG PO TABS: 1.0000 | ORAL_TABLET | Freq: Every day | ORAL | 5 refills | Status: DC

## 2023-11-15 NOTE — Telephone Encounter (Signed)
 Excellent thank you!!!

## 2023-11-15 NOTE — Telephone Encounter (Signed)
 Patient called back and we discussed transitioning from [facility] to [facility] with her renal function. Patient was agreeable to the change. Sent new prescription to her preferred pharmacy

## 2023-11-15 NOTE — Telephone Encounter (Signed)
 Left HIPAA compliant voicemail regarding changing medication therapy from Biktarvy to Dovato in response to reduced renal function and not on dialysis.

## 2023-11-22 ENCOUNTER — Other Ambulatory Visit: Payer: Self-pay

## 2023-11-22 DIAGNOSIS — Z1231 Encounter for screening mammogram for malignant neoplasm of breast: Secondary | ICD-10-CM

## 2023-11-29 ENCOUNTER — Ambulatory Visit
Admission: RE | Admit: 2023-11-29 | Discharge: 2023-11-29 | Disposition: A | Discharge status: Home / Self Care | Source: Ambulatory Visit

## 2023-11-29 ENCOUNTER — Other Ambulatory Visit: Payer: Self-pay

## 2023-11-29 DIAGNOSIS — Z1231 Encounter for screening mammogram for malignant neoplasm of breast: Secondary | ICD-10-CM

## 2023-11-29 DIAGNOSIS — N63 Unspecified lump in unspecified breast: Secondary | ICD-10-CM

## 2024-01-01 ENCOUNTER — Other Ambulatory Visit

## 2024-01-01 ENCOUNTER — Encounter

## 2024-01-02 ENCOUNTER — Ambulatory Visit
Admission: RE | Admit: 2024-01-02 | Discharge: 2024-01-02 | Disposition: A | Discharge status: Home / Self Care | Source: Ambulatory Visit

## 2024-01-02 ENCOUNTER — Other Ambulatory Visit: Payer: Self-pay

## 2024-01-02 DIAGNOSIS — N6320 Unspecified lump in the left breast, unspecified quadrant: Secondary | ICD-10-CM

## 2024-01-02 DIAGNOSIS — N641 Fat necrosis of breast: Secondary | ICD-10-CM

## 2024-01-02 DIAGNOSIS — N63 Unspecified lump in unspecified breast: Secondary | ICD-10-CM

## 2024-01-03 NOTE — Progress Notes (Signed)
 The 10-year ASCVD risk score (Arnett DK, et al., 2019) is: 5.8%   Values used to calculate the score:     Age: 48 years     Sex: Female     Is Non-Hispanic African American: Yes     Diabetic: No     Tobacco smoker: No     Systolic Blood Pressure: 153 mmHg     Is BP treated: Yes     HDL Cholesterol: 67 mg/dL     Total Cholesterol: 235 mg/dL  No current statin therapy, next appointment note updated.   Vianca Bracher, BSN, RN

## 2024-02-14 ENCOUNTER — Encounter: Payer: Self-pay | Admitting: Physician Assistant

## 2024-02-14 ENCOUNTER — Ambulatory Visit (INDEPENDENT_AMBULATORY_CARE_PROVIDER_SITE_OTHER): Admitting: Physician Assistant

## 2024-02-14 ENCOUNTER — Other Ambulatory Visit: Payer: Self-pay

## 2024-02-14 DIAGNOSIS — M25561 Pain in right knee: Secondary | ICD-10-CM | POA: Diagnosis not present

## 2024-02-14 MED ORDER — LIDOCAINE HCL 1 % IJ SOLN
4.0000 mL | INTRAMUSCULAR | Status: AC | PRN
  Administered 2024-02-14: 4 mL

## 2024-02-14 MED ORDER — METHYLPREDNISOLONE ACETATE 40 MG/ML IJ SUSP
40.0000 mg | INTRAMUSCULAR | Status: AC | PRN
  Administered 2024-02-14: 40 mg via INTRA_ARTICULAR

## 2024-02-14 NOTE — Progress Notes (Signed)
 Office Visit Note   Patient: Brooke Baldwin           Date of Birth: 1976-02-11           MRN: [id number] Visit Date: 02/14/2024              Requested by: Duncan, Kathyann S, MD [address] [place],  [place] [address] PCP: Cleatus Donovan RAMAN, MD   Assessment & Plan: Visit Diagnoses:  1. Acute pain of right knee     Plan: Patient is a 48 year old woman with ongoing pain in her right knee.  She is status post a fall in December 2024.  She had pain in her knee at that time x-rays did not show any acute fractures but more degenerative changes.  No evidence of infection.  I told her we could try an aspiration and injection.  She like to go forward with this today.  Aspirated about 15 cc of clear fluid and injected her with methylprednisolone  will follow-up in a month  Follow-Up Instructions: No follow-ups on file.   Orders:  Orders Placed This Encounter  Procedures  . XR KNEE 3 VIEW RIGHT   No orders of the defined types were placed in this encounter.     Procedures: Large Joint Inj: R knee on 02/14/2024 10:19 AM Indications: pain and diagnostic evaluation Details: 25 G 1.5 in needle, superolateral approach  Arthrogram: No  Medications: 40 mg methylPREDNISolone  acetate 40 MG/ML; 4 mL lidocaine  1 % Aspirate: clear Outcome: tolerated well, no immediate complications  After obtaining verbal consent superior lateral pouch of the right knee was prepped with alcohol and Betadine.  4 cc of lidocaine  was injected.  After adequate analgesia it was reprepped 18-gauge needle aspirated 15 cc of clear serous few fluid.  40 mg of methylprednisolone  was injected Band-Aid was applied she ambulated from the clinic Procedure, treatment alternatives, risks and benefits explained, specific risks discussed. Consent was given by the patient.      Clinical Data: No additional findings.   Subjective: No chief complaint on file.   HPI pleasant 48 year old woman comes in today with a  chief complaint of right knee pain.  Also has associated swelling swelling gets worse as well as pain as she progresses through her day.  She did have a fall in December at which time knee x-rays did not show any acute fractures.  She cannot take ibuprofen  because she has kidney disease  Review of Systems  All other systems reviewed and are negative.    Objective: Vital Signs: LMP 04/02/2015 (Approximate)   Physical Exam Constitutional:      Appearance: Normal appearance.  Pulmonary:     Effort: Pulmonary effort is normal.  Skin:    General: Skin is warm and dry.  Neurological:     General: No focal deficit present.     Mental Status: She is alert and oriented to person, place, and time.  Psychiatric:        Mood and Affect: Mood normal.        Behavior: Behavior normal.     Ortho Exam Examination of her knee she has a moderate effusion no redness no cellulitis compartments are soft and compressible she is neurovascularly intact she has full flexion and extension good quad mechanism and patella tendon mechanism Specialty Comments:  No specialty comments available.  Imaging: No results found.   PMFS History: Patient Active Problem List   Diagnosis Date Noted  . Routine screening for STI (sexually transmitted  infection) 05/01/2023  . Uterine fibroid 12/27/2021  . History of difficult venous access 12/27/2021  . Immunosuppressed status (HCC)   . Hyperkalemia 08/12/2021  . Adnexal mass 05/31/2021  . 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 Class 1 obesity due to excess calories with serious comorbidity and body mass index (BMI) of 31.0 to 31.9 in adult 09/08/2020  . Hypertension 09/08/2020  . Need for pneumocystis prophylaxis 07/27/2020  . Vaccine counseling 07/27/2020  . Metabolic acidosis 07/01/2020  . Hypoalbuminemia 07/01/2020  . Secondary hyperparathyroidism (HCC) 07/01/2020  . HIV (human immunodeficiency virus infection) (HCC) 06/30/2020  .  End stage renal disease (HCC) 06/30/2020  . Elevated blood pressure reading without diagnosis of hypertension 06/30/2020  . Right trigeminal neuralgia 12/09/2019   Past Medical History:  Diagnosis Date  . Anemia associated with chronic renal failure   . Atrophic kidney    bilateral  . CIN I (cervical intraepithelial neoplasia I)   . ESRD (end stage renal disease) on dialysis [facility]) 05/2020   hemodialysis  T/ Th/ Sat  @ [facility] ([address] in [place], ( 10-17-2020  s/p left AVG 11/ 2021 unable to use as of yet, currently using right IJ tunneled cath (right side of upper chest) for dialysis  . FSGS (focal segmental glomerulosclerosis)    renal biopsy 04-15-2020 in epic , secondary to HTN/ HIV/ Obese  . Heart palpitations 11/ 2021  (10-17-2020  pt stated the palpitations does not bother her and has no other symptoms , stated they started 11/ 2021)   pt was been seen by cardiology, dr s. vallabhajosywla ([facility] in [place]),  per pt had event monitor for 17 days , was told normal with  no arrhythmias,  and stated echo is scheduled for 10-28-2020  . History of vulvar dysplasia   . HIV (human immunodeficiency virus infection) (HCC)    followed by ID-- dr a. wallace  . Hypertension   . S/P arteriovenous (AV) fistula creation 07-04-2020 @[facility]    left forearm,  07-28-2020  angioplasty of thrombosis  . Secondary hyperparathyroidism of renal origin (HCC)   . Tachycardia   . Thrombocytopenia (HCC) 07/01/2020  . Trigeminal neuralgia    ride side ---  neurologist--- dr onita   (10-17-2020  pt stated last episode about 4 months ago was mild)  . VIN II (vulvar intraepithelial neoplasia II)     Family History  Problem Relation Age of Onset  . Diabetes Mother   . Healthy Father   . Breast cancer Maternal Aunt   . Pancreatic cancer Maternal Grandfather   . Colon cancer Neg Hx   . Colon polyps Neg Hx   . Esophageal cancer Neg Hx   . Rectal cancer Neg Hx   . Stomach cancer Neg Hx   . Prostate cancer Neg Hx    . Endometrial cancer Neg Hx   . Ovarian cancer Neg Hx     Past Surgical History:  Procedure Laterality Date  . A/V FISTULAGRAM Left 07/28/2020   Procedure: A/V FISTULAGRAM;  Surgeon: Gretta Lonni PARAS, MD;  Location: [facility] INVASIVE CV LAB;  Service: Cardiovascular;  Laterality: Left;  . AV FISTULA PLACEMENT Left 07/04/2020   Procedure: INSERTION OF LEFT ARTERIOVENOUS (AV) GORE-TEX GRAFT ARM (ACUSEAL);  Surgeon: Magda Debby SAILOR, MD;  Location: [facility] OR;  Service: Vascular;  Laterality: Left;  . CERVICAL BIOPSY  W/ LOOP ELECTRODE EXCISION  2005  . CO2 LASER APPLICATION N/A 12/22/2020   Procedure: CO2 LASER APPLICATION;  Surgeon: Eloy Herring, MD;  Location: [facility];  Service: Gynecology;  Laterality: N/A;  . INSERTION OF DIALYSIS CATHETER N/A 08/14/2021   Procedure: TUNNELED DIALYSIS CATHETER EXCHANGE;  Surgeon: Eliza Lonni RAMAN, MD;  Location: [facility] OR;  Service: Vascular;  Laterality: N/A;  . ORIF ANKLE FRACTURE Right 2012   per pt retained hardware  . PERIPHERAL VASCULAR BALLOON ANGIOPLASTY Left 07/28/2020   Procedure: PERIPHERAL VASCULAR BALLOON ANGIOPLASTY;  Surgeon: Gretta Lonni PARAS, MD;  Location: [facility] INVASIVE CV LAB;  Service: Cardiovascular;  Laterality: Left;  AVF  . VULVA SURGERY  2013   excision   Social History   Occupational History  . Occupation: Physicist, medical  Tobacco Use  . Smoking status: Never  . Smokeless tobacco: Never  Vaping Use  . Vaping status: Never Used  Substance and Sexual Activity  . Alcohol use: Not Currently  . Drug use: Not Currently  . Sexual activity: Not Currently    Birth control/protection: Post-menopausal    Comment: declined condoms

## 2024-02-17 ENCOUNTER — Telehealth: Payer: Self-pay | Admitting: Physician Assistant

## 2024-02-17 NOTE — Telephone Encounter (Signed)
 Patient called. She says her job needs a letter stating that she can return without restrictions. Would like a call.

## 2024-02-18 ENCOUNTER — Telehealth: Payer: Self-pay

## 2024-03-09 NOTE — Telephone Encounter (Signed)
 [name]

## 2024-03-16 ENCOUNTER — Ambulatory Visit: Admitting: Internal Medicine

## 2024-03-16 NOTE — Telephone Encounter (Signed)
 [name]

## 2024-03-25 ENCOUNTER — Telehealth: Payer: Self-pay | Admitting: Physician Assistant

## 2024-03-25 NOTE — Telephone Encounter (Signed)
 Called pt and left vm for pt to call Crystal be to reschedule Ronal Caldron appt

## 2024-03-30 ENCOUNTER — Ambulatory Visit: Admitting: Physician Assistant

## 2024-04-07 ENCOUNTER — Ambulatory Visit (HOSPITAL_BASED_OUTPATIENT_CLINIC_OR_DEPARTMENT_OTHER): Admitting: Physician Assistant

## 2024-04-08 ENCOUNTER — Encounter (HOSPITAL_BASED_OUTPATIENT_CLINIC_OR_DEPARTMENT_OTHER): Payer: Self-pay

## 2024-04-09 ENCOUNTER — Ambulatory Visit (HOSPITAL_BASED_OUTPATIENT_CLINIC_OR_DEPARTMENT_OTHER): Admitting: Student

## 2024-04-09 ENCOUNTER — Encounter (HOSPITAL_BASED_OUTPATIENT_CLINIC_OR_DEPARTMENT_OTHER): Payer: Self-pay | Admitting: Student

## 2024-04-09 DIAGNOSIS — M25561 Pain in right knee: Secondary | ICD-10-CM | POA: Diagnosis not present

## 2024-04-09 NOTE — Progress Notes (Signed)
 Chief Complaint: Right knee pain     History of Present Illness:    Brooke Baldwin is a 48 y.o. female who presents today for follow-up of right knee pain.  She was seen by my colleague Ronal Arlean Bach, PA-C on 7/11.  She did undergo an aspiration and cortisone injection at that time and states that this has given her some good relief.  She does continue to experience some intermittent symptoms although she is able to tolerate this okay.  Denies having to take any pain medicine.   Surgical History:   None  PMH/PSH/Family History/Social History/Meds/Allergies:    Past Medical History:  Diagnosis Date   Anemia associated with chronic renal failure    Atrophic kidney    bilateral   CIN I (cervical intraepithelial neoplasia I)    ESRD (end stage renal disease) on dialysis (HCC) 05/2020   hemodialysis  T/ Th/ Sat  @ [facility] ([address] in [place], ( 10-17-2020  s/p left AVG 11/ 2021 unable to use as of yet, currently using right IJ tunneled cath (right side of upper chest) for dialysis   FSGS (focal segmental glomerulosclerosis)    renal biopsy 04-15-2020 in epic , secondary to HTN/ HIV/ Obese   Heart palpitations 11/ 2021  (10-17-2020  pt stated the palpitations does not bother her and has no other symptoms , stated they started 11/ 2021)   pt was been seen by cardiology, dr s. emelie ([facility] in [place]),  per pt had event monitor for 17 days , was told normal with  no arrhythmias,  and stated echo is scheduled for 10-28-2020   History of vulvar dysplasia    HIV (human immunodeficiency virus infection) (HCC)    followed by ID-- dr a. wallace   Hypertension    S/P arteriovenous (AV) fistula creation 07-04-2020 @[facility]    left forearm,  07-28-2020  angioplasty of thrombosis   Secondary hyperparathyroidism of renal origin (HCC)    Tachycardia    Thrombocytopenia (HCC) 07/01/2020   Trigeminal neuralgia    ride side ---  neurologist--- dr onita    (10-17-2020  pt stated last episode about 4 months ago was mild)   VIN II (vulvar intraepithelial neoplasia II)    Past Surgical History:  Procedure Laterality Date   A/V FISTULAGRAM Left 07/28/2020   Procedure: A/V FISTULAGRAM;  Surgeon: Gretta Lonni PARAS, MD;  Location: [facility] INVASIVE CV LAB;  Service: Cardiovascular;  Laterality: Left;   AV FISTULA PLACEMENT Left 07/04/2020   Procedure: INSERTION OF LEFT ARTERIOVENOUS (AV) GORE-TEX GRAFT ARM (ACUSEAL);  Surgeon: Magda Debby SAILOR, MD;  [facility]: [facility] OR;  Service: Vascular;  Laterality: Left;   CERVICAL BIOPSY  W/ LOOP ELECTRODE EXCISION  2005   CO2 LASER APPLICATION N/A 12/22/2020   Procedure: CO2 LASER APPLICATION;  Surgeon: Eloy Herring, MD;  Location: [facility];  Service: Gynecology;  Laterality: N/A;   INSERTION OF DIALYSIS CATHETER N/A 08/14/2021   Procedure: TUNNELED DIALYSIS CATHETER EXCHANGE;  Surgeon: Eliza Lonni RAMAN, MD;  Location: [facility] OR;  Service: Vascular;  Laterality: N/A;   ORIF ANKLE FRACTURE Right 2012   per pt retained hardware   PERIPHERAL VASCULAR BALLOON ANGIOPLASTY Left 07/28/2020   Procedure: PERIPHERAL VASCULAR BALLOON ANGIOPLASTY;  Surgeon: Gretta Lonni PARAS, MD;  Location: [facility] INVASIVE CV LAB;  Service:  Cardiovascular;  Laterality: Left;  AVF   VULVA SURGERY  2013   excision   Social History   Socioeconomic History   Marital status: Significant Other    Spouse name: Not on file   Number of children: 0   Years of education: college   Highest education level: Bachelor's degree (e.g., BA, AB, BS)  Occupational History   Occupation: Physicist, medical  Tobacco Use   Smoking status: Never   Smokeless tobacco: Never  Vaping Use   Vaping status: Never Used  Substance and Sexual Activity   Alcohol use: Not Currently   Drug use: Not Currently   Sexual activity: Not Currently    Birth control/protection: Post-menopausal    Comment: declined condoms  Other Topics Concern   Not on file   Social History Narrative   Lives at home with significant other.   Right-handed.   No daily caffeine use.   Social Drivers of Corporate investment banker Strain: Low Risk  (11/14/2023)   Received from [facility]   Overall Financial Resource Strain (CARDIA)    Difficulty of Paying Living Expenses: Not hard at all  Food Insecurity: No Food Insecurity (11/14/2023)   Received from [facility]   Hunger Vital Sign    Within the past 12 months, you worried that your food would run out before you got the money to buy more.: Never true    Within the past 12 months, the food you bought just didn't last and you didn't have money to get more.: Never true  Transportation Needs: No Transportation Needs (11/14/2023)   Received from [facility] - Transportation    Lack of Transportation (Medical): No    Lack of Transportation (Non-Medical): No  Physical Activity: Not on file  Stress: Not on file  Social Connections: Unknown (03/28/2023)   Received from [facility]   Social Network    Social Network: Not on file   Family History  Problem Relation Age of Onset   Diabetes Mother    Healthy Father    Breast cancer Maternal Aunt    Pancreatic cancer Maternal Grandfather    Colon cancer Neg Hx    Colon polyps Neg Hx    Esophageal cancer Neg Hx    Rectal cancer Neg Hx    Stomach cancer Neg Hx    Prostate cancer Neg Hx    Endometrial cancer Neg Hx    Ovarian cancer Neg Hx    Allergies  Allergen Reactions   Lioresal  [Baclofen ] Other (See Comments)    Intolerable dizziness   Neurontin  [Gabapentin ] Other (See Comments)    Intolerable dizziness   Penicillins Other (See Comments)    Childhood allergy Unknown reaction    Current Outpatient Medications  Medication Sig Dispense Refill   albuterol  (VENTOLIN  HFA) 108 (90 Base) MCG/ACT inhaler Inhale 1-2 puffs into the lungs every 6 (six) hours as needed for wheezing or shortness of breath. 1 each 0   amLODipine  (NORVASC ) 10 MG  tablet Take 1 tablet (10 mg total) by mouth daily. 30 tablet 1   dolutegravir-lamiVUDine (DOVATO ) 50-300 MG tablet Take 1 tablet by mouth daily. 30 tablet 5   predniSONE  (STERAPRED UNI-PAK 21 TAB) 10 MG (21) TBPK tablet Take as directed 21 tablet 3   sodium bicarbonate  650 MG tablet Take 1 tablet (650 mg total) by mouth 2 (two) times daily. 60 tablet 1   No current facility-administered medications for this visit.   No results found.  Review  of Systems:   A ROS was performed including pertinent positives and negatives as documented in the HPI.  Physical Exam :   Constitutional: NAD and appears stated age Neurological: Alert and oriented Psych: Appropriate affect and cooperative Last menstrual period 04/02/2015.   Comprehensive Musculoskeletal Exam:    Right knee exam demonstrates active range of motion from 0 to 120 degrees.  Mild prepatellar fluctuance without overlying erythema or warmth.  Minimal effusion present.  No significant medial or lateral joint line tenderness.  Imaging:   Xray review from 02/14/2024 (right knee 3 views): Mild to moderate degenerative changes within the medial compartment   I personally reviewed and interpreted the radiographs.   Assessment:   48 y.o. female following up today on right knee pain.  Aspiration injection performed on 7/11 have given her some good relief although she does continue to experience some off-and-on symptoms.  May have a component of prepatellar bursitis as there is a small amount of fluid anteriorly.  She is unable to take NSAIDs due to kidney disease, therefore I have recommended use of topical Voltaren as well as compression sleeve to aid in swelling.  Plan to have her follow-up as needed.  Plan :    - Return to clinic as needed     I personally saw and evaluated the patient, and participated in the management and treatment plan.  Leonce Reveal, PA-C Orthopedics

## 2024-06-01 ENCOUNTER — Other Ambulatory Visit: Payer: Self-pay

## 2024-06-01 DIAGNOSIS — Z21 Asymptomatic human immunodeficiency virus [HIV] infection status: Secondary | ICD-10-CM

## 2024-06-01 MED ORDER — BIKTARVY 50-200-25 MG PO TABS: 1.0000 | ORAL_TABLET | Freq: Every day | ORAL | 11 refills | Status: AC

## 2024-06-01 NOTE — Progress Notes (Signed)
 Per last pharmacy notes patient taking Biktarvy  and no longer taking Dovato .   New rx sent to pharmacy and patient will also schedule overdue visit with labs.

## 2024-06-02 ENCOUNTER — Ambulatory Visit (INDEPENDENT_AMBULATORY_CARE_PROVIDER_SITE_OTHER): Admitting: Student

## 2024-06-02 ENCOUNTER — Encounter (HOSPITAL_BASED_OUTPATIENT_CLINIC_OR_DEPARTMENT_OTHER): Payer: Self-pay | Admitting: Student

## 2024-06-02 ENCOUNTER — Telehealth (HOSPITAL_BASED_OUTPATIENT_CLINIC_OR_DEPARTMENT_OTHER): Payer: Self-pay | Admitting: Student

## 2024-06-02 DIAGNOSIS — G8929 Other chronic pain: Secondary | ICD-10-CM | POA: Diagnosis not present

## 2024-06-02 DIAGNOSIS — M25561 Pain in right knee: Secondary | ICD-10-CM | POA: Diagnosis not present

## 2024-06-02 MED ORDER — TRIAMCINOLONE ACETONIDE 40 MG/ML IJ SUSP
2.0000 mL | INTRAMUSCULAR | Status: AC | PRN
  Administered 2024-06-02: 2 mL via INTRA_ARTICULAR

## 2024-06-02 MED ORDER — LIDOCAINE HCL 1 % IJ SOLN
4.0000 mL | INTRAMUSCULAR | Status: AC | PRN
  Administered 2024-06-02: 4 mL

## 2024-06-02 NOTE — Telephone Encounter (Signed)
 Patient had an injection and states that he knee is hurting and swollen. Best contct [phone]

## 2024-06-02 NOTE — Telephone Encounter (Signed)
 LMOM and advised to take OTC pain relievers, ice, and elevate and to give our office a call back if there is no improvement.

## 2024-06-02 NOTE — Progress Notes (Signed)
 Chief Complaint: Right knee pain     History of Present Illness:   06/02/24: Patient presents today for follow-up of chronic right knee pain.  Her pain began back in December 2024 after sustaining a fall onto her right knee.  She was seen in clinic on 7/11 and received a aspiration and cortisone injection of the knee which did give her relief for 1 to 2 months.  Today she reports continued pain and swelling particularly with periods of activity.  Rates current pain at a 2/10 but becomes severe after long periods on her feet at work.  She has been using a compression sleeve and Voltaren.    04/09/24: Brooke Baldwin is a 48 y.o. female who presents today for follow-up of right knee pain.  She was seen by my colleague Ronal Arlean Bach, PA-C on 7/11.  She did undergo an aspiration and cortisone injection at that time and states that this has given her some good relief.  She does continue to experience some intermittent symptoms although she is able to tolerate this okay.  Denies having to take any pain medicine.   Surgical History:   None  PMH/PSH/Family History/Social History/Meds/Allergies:    Past Medical History:  Diagnosis Date   Anemia associated with chronic renal failure    Atrophic kidney    bilateral   CIN I (cervical intraepithelial neoplasia I)    ESRD (end stage renal disease) on dialysis (HCC) 05/2020   hemodialysis  T/ Th/ Sat  @ [facility] ([address] in [place], ( 10-17-2020  s/p left AVG 11/ 2021 unable to use as of yet, currently using right IJ tunneled cath (right side of upper chest) for dialysis   FSGS (focal segmental glomerulosclerosis)    renal biopsy 04-15-2020 in epic , secondary to HTN/ HIV/ Obese   Heart palpitations 11/ 2021  (10-17-2020  pt stated the palpitations does not bother her and has no other symptoms , stated they started 11/ 2021)   pt was been seen by cardiology, dr s. emelie ([facility] in [place]),  per pt  had event monitor for 17 days , was told normal with  no arrhythmias,  and stated echo is scheduled for 10-28-2020   History of vulvar dysplasia    HIV (human immunodeficiency virus infection) (HCC)    followed by ID-- dr a. wallace   Hypertension    S/P arteriovenous (AV) fistula creation 07-04-2020 @[facility]    left forearm,  07-28-2020  angioplasty of thrombosis   Secondary hyperparathyroidism of renal origin    Tachycardia    Thrombocytopenia 07/01/2020   Trigeminal neuralgia    ride side ---  neurologist--- dr onita   (10-17-2020  pt stated last episode about 4 months ago was mild)   VIN II (vulvar intraepithelial neoplasia II)    Past Surgical History:  Procedure Laterality Date   A/V FISTULAGRAM Left 07/28/2020   Procedure: A/V FISTULAGRAM;  Surgeon: Gretta Lonni PARAS, MD;  Location: [facility] INVASIVE CV LAB;  Service: Cardiovascular;  Laterality: Left;   AV FISTULA PLACEMENT Left 07/04/2020   Procedure: INSERTION OF LEFT ARTERIOVENOUS (AV) GORE-TEX GRAFT ARM (ACUSEAL);  Surgeon: Magda Debby SAILOR, MD;  Location: [facility] OR;  Service: Vascular;  Laterality: Left;   CERVICAL BIOPSY  W/ LOOP ELECTRODE EXCISION  2005   CO2 LASER APPLICATION N/A  12/22/2020   Procedure: CO2 LASER APPLICATION;  Surgeon: Eloy Herring, MD;  Location: [facility];  Service: Gynecology;  Laterality: N/A;   INSERTION OF DIALYSIS CATHETER N/A 08/14/2021   Procedure: TUNNELED DIALYSIS CATHETER EXCHANGE;  Surgeon: Eliza Lonni RAMAN, MD;  Location: [facility] OR;  Service: Vascular;  Laterality: N/A;   ORIF ANKLE FRACTURE Right 2012   per pt retained hardware   PERIPHERAL VASCULAR BALLOON ANGIOPLASTY Left 07/28/2020   Procedure: PERIPHERAL VASCULAR BALLOON ANGIOPLASTY;  Surgeon: Gretta Lonni PARAS, MD;  Location: [facility] INVASIVE CV LAB;  Service: Cardiovascular;  Laterality: Left;  AVF   VULVA SURGERY  2013   excision   Social History   Socioeconomic History   Marital status: Significant Other    Spouse name: Not on  file   Number of children: 0   Years of education: college   Highest education level: Bachelor's degree (e.g., BA, AB, BS)  Occupational History   Occupation: Physicist, Medical  Tobacco Use   Smoking status: Never   Smokeless tobacco: Never  Vaping Use   Vaping status: Never Used  Substance and Sexual Activity   Alcohol use: Not Currently   Drug use: Not Currently   Sexual activity: Not Currently    Birth control/protection: Post-menopausal    Comment: declined condoms  Other Topics Concern   Not on file  Social History Narrative   Lives at home with significant other.   Right-handed.   No daily caffeine use.   Social Drivers of Corporate Investment Banker Strain: Low Risk  (11/14/2023)   Received from [facility]   Overall Financial Resource Strain (CARDIA)    Difficulty of Paying Living Expenses: Not hard at all  Food Insecurity: No Food Insecurity (11/14/2023)   Received from [facility]   Hunger Vital Sign    Within the past 12 months, you worried that your food would run out before you got the money to buy more.: Never true    Within the past 12 months, the food you bought just didn't last and you didn't have money to get more.: Never true  Transportation Needs: No Transportation Needs (11/14/2023)   Received from [facility] - Transportation    Lack of Transportation (Medical): No    Lack of Transportation (Non-Medical): No  Physical Activity: Not on file  Stress: Not on file  Social Connections: Unknown (03/28/2023)   Received from [facility]   Social Network    Social Network: Not on file   Family History  Problem Relation Age of Onset   Diabetes Mother    Healthy Father    Breast cancer Maternal Aunt    Pancreatic cancer Maternal Grandfather    Colon cancer Neg Hx    Colon polyps Neg Hx    Esophageal cancer Neg Hx    Rectal cancer Neg Hx    Stomach cancer Neg Hx    Prostate cancer Neg Hx    Endometrial cancer Neg Hx    Ovarian cancer Neg  Hx    Allergies  Allergen Reactions   Lioresal  [Baclofen ] Other (See Comments)    Intolerable dizziness   Neurontin  [Gabapentin ] Other (See Comments)    Intolerable dizziness   Penicillins Other (See Comments)    Childhood allergy Unknown reaction    Current Outpatient Medications  Medication Sig Dispense Refill   albuterol  (VENTOLIN  HFA) 108 (90 Base) MCG/ACT inhaler Inhale 1-2 puffs into the lungs every 6 (six) hours as needed for wheezing or  shortness of breath. 1 each 0   amLODipine  (NORVASC ) 10 MG tablet Take 1 tablet (10 mg total) by mouth daily. 30 tablet 1   bictegravir-emtricitabine -tenofovir  AF (BIKTARVY ) 50-200-25 MG TABS tablet Take 1 tablet by mouth daily. 30 tablet 11   predniSONE  (STERAPRED UNI-PAK 21 TAB) 10 MG (21) TBPK tablet Take as directed 21 tablet 3   sodium bicarbonate  650 MG tablet Take 1 tablet (650 mg total) by mouth 2 (two) times daily. 60 tablet 1   No current facility-administered medications for this visit.   No results found.  Review of Systems:   A ROS was performed including pertinent positives and negatives as documented in the HPI.  Physical Exam :   Constitutional: NAD and appears stated age Neurological: Alert and oriented Psych: Appropriate affect and cooperative Last menstrual period 04/02/2015.   Comprehensive Musculoskeletal Exam:    Exam of the right knee demonstrates presence of a moderate effusion without overlying erythema or warmth.  Active range of motion from 0 to 120 degrees without significant crepitus.  Tenderness over the medial joint line.  Pain with [place].  Imaging:     Assessment:   47 y.o. female with continued pain and swelling of the right knee 10 months status post fall last December.  X-rays have not demonstrated any evidence of fracture although she does have mild tricompartmental degenerative findings.  Given her persistent symptoms despite conservative therapies including topical NSAIDs, compression, rest,  elevation, and injections, I would like to proceed with an MRI for further soft tissue evaluation.  An underlying meniscal or chondral injury could be more symptomatic than her early arthritis.  I have offered to repeat cortisone injection today which patient is agreeable to and this was performed without difficulty.  Will plan to see her back shortly after MRI for review.  Plan :    - Right knee cortisone injection performed today - Obtain MRI of the right knee and return to clinic for review and treatment discussion    Procedure Note  Patient: Brooke Baldwin             Date of Birth: 11/19/1975           MRN: [id number]             Visit Date: 06/02/2024  Procedures: Visit Diagnoses:  1. Chronic pain of right knee     Large Joint Inj: R knee on 06/02/2024 10:52 AM Indications: pain Details: 22 G 1.5 in needle, anterolateral approach Medications: 4 mL lidocaine  1 %; 2 mL triamcinolone acetonide 40 MG/ML Outcome: tolerated well, no immediate complications Procedure, treatment alternatives, risks and benefits explained, specific risks discussed. Consent was given by the patient. Immediately prior to procedure a time out was called to verify the correct patient, procedure, equipment, support staff and site/side marked as required. Patient was prepped and draped in the usual sterile fashion.       I personally saw and evaluated the patient, and participated in the management and treatment plan.  Leonce Reveal, PA-C Orthopedics

## 2024-06-04 ENCOUNTER — Ambulatory Visit: Admitting: Internal Medicine

## 2024-06-08 ENCOUNTER — Encounter: Payer: Self-pay | Admitting: Radiology

## 2024-06-09 ENCOUNTER — Encounter (HOSPITAL_BASED_OUTPATIENT_CLINIC_OR_DEPARTMENT_OTHER): Payer: Self-pay | Admitting: Student

## 2024-06-10 ENCOUNTER — Ambulatory Visit: Admitting: Internal Medicine

## 2024-06-11 ENCOUNTER — Other Ambulatory Visit

## 2024-06-26 ENCOUNTER — Emergency Department (HOSPITAL_BASED_OUTPATIENT_CLINIC_OR_DEPARTMENT_OTHER)
Admission: EM | Admit: 2024-06-26 | Discharge: 2024-06-26 | Disposition: A | Discharge status: Home / Self Care | Attending: Emergency Medicine | Admitting: Emergency Medicine

## 2024-06-26 ENCOUNTER — Other Ambulatory Visit: Payer: Self-pay

## 2024-06-26 ENCOUNTER — Other Ambulatory Visit (HOSPITAL_BASED_OUTPATIENT_CLINIC_OR_DEPARTMENT_OTHER): Payer: Self-pay | Admitting: Student

## 2024-06-26 ENCOUNTER — Emergency Department (HOSPITAL_BASED_OUTPATIENT_CLINIC_OR_DEPARTMENT_OTHER): Admitting: Radiology

## 2024-06-26 ENCOUNTER — Telehealth: Payer: Self-pay | Admitting: Student

## 2024-06-26 DIAGNOSIS — J209 Acute bronchitis, unspecified: Secondary | ICD-10-CM | POA: Insufficient documentation

## 2024-06-26 DIAGNOSIS — R059 Cough, unspecified: Secondary | ICD-10-CM | POA: Diagnosis present

## 2024-06-26 MED ORDER — PREDNISONE 20 MG PO TABS: 40.0000 mg | ORAL_TABLET | Freq: Every day | ORAL | 0 refills | Status: DC

## 2024-06-26 MED ORDER — BUDESONIDE-FORMOTEROL FUMARATE 80-4.5 MCG/ACT IN AERO: 2.0000 | INHALATION_SPRAY | Freq: Two times a day (BID) | RESPIRATORY_TRACT | 12 refills | Status: DC

## 2024-06-26 MED ORDER — DOXYCYCLINE HYCLATE 100 MG PO CAPS: 100.0000 mg | ORAL_CAPSULE | Freq: Two times a day (BID) | ORAL | 0 refills | Status: DC

## 2024-06-26 MED ORDER — ONDANSETRON 4 MG PO TBDP: ORAL_TABLET | ORAL | 0 refills | Status: DC

## 2024-06-26 MED ORDER — ALBUTEROL SULFATE HFA 108 (90 BASE) MCG/ACT IN AERS: 2.0000 | INHALATION_SPRAY | RESPIRATORY_TRACT | Status: DC | PRN

## 2024-06-26 NOTE — ED Triage Notes (Signed)
 Pt POV reporting cough that began this morning and multiple nosebleeds yesterday, concerned for pneumonia/bronchitis.

## 2024-06-26 NOTE — ED Provider Notes (Signed)
 [place] EMERGENCY DEPARTMENT AT [facility] Provider Note   CSN: [id number] Arrival date & time: 06/26/24  9781     Patient presents with: Cough   Brooke Baldwin is a 48 y.o. female.   Presents to the emergency department for evaluation of cough.  Patient reports that she has had cough and chest congestion for the last couple of days.  Tonight she had some vomiting, possibly from forceful vomiting but she does have mild nausea as well.  Patient reports that every year this time her bronchitis acts up when she needs treatment.       Prior to Admission medications   Medication Sig Start Date End Date Taking? Authorizing Provider  budesonide -formoterol  (SYMBICORT ) 80-4.5 MCG/ACT inhaler Inhale 2 puffs into the lungs 2 (two) times daily. 06/26/24  Yes Omer Monter, Lonni PARAS, MD  doxycycline  (VIBRAMYCIN ) 100 MG capsule Take 1 capsule (100 mg total) by mouth 2 (two) times daily. 06/26/24  Yes Nickholas Goldston, Lonni PARAS, MD  ondansetron  (ZOFRAN -ODT) 4 MG disintegrating tablet 4mg  ODT q4 hours prn nausea/vomit 06/26/24  Yes Abayomi Pattison, Lonni PARAS, MD  predniSONE  (DELTASONE ) 20 MG tablet Take 2 tablets (40 mg total) by mouth daily with breakfast. 06/26/24  Yes Durand Wittmeyer, Lonni PARAS, MD  albuterol  (VENTOLIN  HFA) 108 (90 Base) MCG/ACT inhaler Inhale 1-2 puffs into the lungs every 6 (six) hours as needed for wheezing or shortness of breath. 06/15/22   Logan Ubaldo NOVAK, PA-C  amLODipine  (NORVASC ) 10 MG tablet Take 1 tablet (10 mg total) by mouth daily. 12/28/21 05/01/23  Sebastian Toribio GAILS, MD  bictegravir-emtricitabine -tenofovir  AF (BIKTARVY ) 50-200-25 MG TABS tablet Take 1 tablet by mouth daily. 06/01/24 05/27/25  Dennise Kingsley, MD  sodium bicarbonate  650 MG tablet Take 1 tablet (650 mg total) by mouth 2 (two) times daily. 12/28/21   Sebastian Toribio GAILS, MD    Allergies: Lioresal  [baclofen ], Neurontin  Aventino.bison ], and Penicillins    Review of Systems  Updated Vital Signs BP  (!) 157/95   Pulse 69   Temp 98.2 F (36.8 C)   Resp 18   Ht 5' 2 (1.575 m)   Wt 83.5 kg   LMP 04/02/2015 (Approximate)   SpO2 100%   BMI 33.65 kg/m   Physical Exam Vitals and nursing note reviewed.  Constitutional:      General: She is not in acute distress.    Appearance: She is well-developed.  HENT:     Head: Normocephalic and atraumatic.     Mouth/Throat:     Mouth: Mucous membranes are moist.  Eyes:     General: Vision grossly intact. Gaze aligned appropriately.     Extraocular Movements: Extraocular movements intact.     Conjunctiva/sclera: Conjunctivae normal.  Cardiovascular:     Rate and Rhythm: Normal rate and regular rhythm.     Pulses: Normal pulses.     Heart sounds: Normal heart sounds, S1 normal and S2 normal. No murmur heard.    No friction rub. No gallop.  Pulmonary:     Effort: Pulmonary effort is normal. No respiratory distress.     Breath sounds: Normal breath sounds.  Abdominal:     General: Bowel sounds are normal.     Palpations: Abdomen is soft.     Tenderness: There is no abdominal tenderness. There is no guarding or rebound.     Hernia: No hernia is present.  Musculoskeletal:        General: No swelling.     Cervical back: Full passive range of motion without pain,  normal range of motion and neck supple. No spinous process tenderness or muscular tenderness. Normal range of motion.     Right lower leg: No edema.     Left lower leg: No edema.  Skin:    General: Skin is warm and dry.     Capillary Refill: Capillary refill takes less than 2 seconds.     Findings: No ecchymosis, erythema, rash or wound.  Neurological:     General: No focal deficit present.     Mental Status: She is alert and oriented to person, place, and time.     GCS: GCS eye subscore is 4. GCS verbal subscore is 5. GCS motor subscore is 6.     Cranial Nerves: Cranial nerves 2-12 are intact.     Sensory: Sensation is intact.     Motor: Motor function is intact.      Coordination: Coordination is intact.  Psychiatric:        Attention and Perception: Attention normal.        Mood and Affect: Mood normal.        Speech: Speech normal.        Behavior: Behavior normal.     (all labs ordered are listed, but only abnormal results are displayed) Labs Reviewed - No data to display   EKG: EKG Interpretation Date/Time:  Friday June 26 2024 02:32:29 EST Ventricular Rate:  70 PR Interval:  136 QRS Duration:  80 QT Interval:  416 QTC Calculation: 449 R Axis:   8  Text Interpretation: Normal sinus rhythm Minimal voltage criteria for LVH, may be normal variant ( R in aVL ) Possible Anterior infarct , age undetermined Abnormal ECG When compared with ECG of 15-Jun-2022 12:51, No acute changes Confirmed by Haze Lonni PARAS [phone]) on 06/26/2024 3:20:18 AM  Radiology: DG Chest 2 View Result Date: 06/26/2024 EXAM: 2 VIEW(S) XRAY OF THE CHEST 06/26/2024 02:40:57 AM COMPARISON: Chest radiograph 07/01/2022 CLINICAL HISTORY: cough FINDINGS: LUNGS AND PLEURA: No focal pulmonary opacity. No pleural effusion. No pneumothorax. HEART AND MEDIASTINUM: No acute abnormality of the cardiac and mediastinal silhouettes. BONES AND SOFT TISSUES: No acute osseous abnormality. IMPRESSION: 1. No acute process. Electronically signed by: Gilmore Molt MD 06/26/2024 03:51 AM EST RP Workstation: [id number]     Procedures   Medications Ordered in the ED - No data to display                                  Medical Decision Making Amount and/or Complexity of Data Reviewed Radiology: ordered and independent interpretation performed. Decision-making details documented in ED Course.  Risk Prescription drug management.   Differential Diagnosis considered includes, but not limited to: COVID-19; influenza; RSV; simple viral URI; strep pharyngitis; pneumonia  NSTEMI emergency department with concerns over cough that been present for a week.  She reports that she gets  bronchitis around this time of the year usually but wants to make sure she does not have pneumonia.  She did have some vomiting today, likely posttussive emesis.  No active vomiting here in the ED.  Abdominal exam benign.  Chest x-ray without pneumonia.     Final diagnoses:  Acute bronchitis, unspecified organism    ED Discharge Orders          Ordered    budesonide -formoterol  (SYMBICORT ) 80-4.5 MCG/ACT inhaler  2 times daily        06/26/24 0356    doxycycline  (VIBRAMYCIN ) 100  MG capsule  2 times daily        06/26/24 0356    predniSONE  (DELTASONE ) 20 MG tablet  Daily with breakfast        06/26/24 0356    ondansetron  (ZOFRAN -ODT) 4 MG disintegrating tablet        06/26/24 0356               Haze Lonni PARAS, MD 06/26/24 2259

## 2024-08-13 ENCOUNTER — Ambulatory Visit (INDEPENDENT_AMBULATORY_CARE_PROVIDER_SITE_OTHER): Admitting: Physician Assistant

## 2024-08-13 ENCOUNTER — Encounter: Payer: Self-pay | Admitting: Physician Assistant

## 2024-08-13 DIAGNOSIS — G8929 Other chronic pain: Secondary | ICD-10-CM

## 2024-08-13 DIAGNOSIS — M25561 Pain in right knee: Secondary | ICD-10-CM | POA: Insufficient documentation

## 2024-08-13 NOTE — Progress Notes (Signed)
 "  Office Visit Note   Patient: Brooke Baldwin           Date of Birth: 1975/10/19           MRN: [id number] Visit Date: 08/13/2024              Requested by: Duncan, Kathyann S, MD [address] [place],  [place] [address] PCP: Cleatus Donovan RAMAN, MD   Assessment & Plan: Visit Diagnoses:  1. Chronic pain of right knee     Plan: Patient comes in today with an over 1 year history of right knee pain.  She is normally followed by Leonce.  Pain has not changed.  She started to get some lateral pain in her right hip.  She declined any x-rays on her hip clinical findings of the hip most likely with trochanteric bursitis talked her about doing some stretching.  We we will wait 2 more make weeks and resubmit for the MRI of the right knee that was ordered with Leonce.  May follow-up with me as needed  Follow-Up Instructions: No follow-ups on file.   Orders:  No orders of the defined types were placed in this encounter.  No orders of the defined types were placed in this encounter.     Procedures: No procedures performed   Clinical Data: No additional findings.   Subjective: No chief complaint on file.   HPI pleasant 49 year old woman comes in today with continued right knee pain.  She is now having instability and falls.  She has been seen by myself last summer and more recently by Leonce Reveal.  At his last visit with her in October he recommended an MRI.  She said she has not had the MRI yet.  Wonders what the step next Epson to be also has some lateral hip pain on the right side hurts when she sleeps on it.  Does not have any groin pain or lower back pain or radiculopathy  Review of Systems  All other systems reviewed and are negative.    Objective: Vital Signs: LMP 04/02/2015   Physical Exam Constitutional:      Appearance: Normal appearance.  Pulmonary:     Effort: Pulmonary effort is normal.  Skin:    General: Skin is warm and dry.  Neurological:      General: No focal deficit present.     Mental Status: She is alert.  Psychiatric:        Mood and Affect: Mood normal.        Behavior: Behavior normal.       Specialty Comments:  No specialty comments available.  Imaging: No results found.   PMFS History: Patient Active Problem List   Diagnosis Date Noted   Pain in right knee 08/13/2024   Routine screening for STI (sexually transmitted infection) 05/01/2023   Uterine fibroid 12/27/2021   History of difficult venous access 12/27/2021   Immunosuppressed status    Hyperkalemia 08/12/2021   Adnexal mass 05/31/2021   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   Class 1 obesity due to excess calories with serious comorbidity and body mass index (BMI) of 31.0 to 31.9 in adult 09/08/2020   Hypertension 09/08/2020   Need for pneumocystis prophylaxis 07/27/2020   Vaccine counseling 07/27/2020   Metabolic acidosis 07/01/2020   Hypoalbuminemia 07/01/2020   Secondary hyperparathyroidism 07/01/2020   HIV (human immunodeficiency virus infection) (HCC) 06/30/2020   End stage renal disease (HCC) 06/30/2020   Elevated blood pressure reading without diagnosis  of hypertension 06/30/2020   Right trigeminal neuralgia 12/09/2019   Past Medical History:  Diagnosis Date   Anemia associated with chronic renal failure    Atrophic kidney    bilateral   CIN I (cervical intraepithelial neoplasia I)    ESRD (end stage renal disease) on dialysis (HCC) 05/2020   hemodialysis  T/ Th/ Sat  @ [facility] ([address] in [place], ( 10-17-2020  s/p left AVG 11/ 2021 unable to use as of yet, currently using right IJ tunneled cath (right side of upper chest) for dialysis   FSGS (focal segmental glomerulosclerosis)    renal biopsy 04-15-2020 in epic , secondary to HTN/ HIV/ Obese   Heart palpitations 11/ 2021  (10-17-2020  pt stated the palpitations does not bother her and has no other symptoms , stated they started 11/ 2021)   pt was been seen by  cardiology, dr s. emelie ([facility] in [place]),  per pt had event monitor for 17 days , was told normal with  no arrhythmias,  and stated echo is scheduled for 10-28-2020   History of vulvar dysplasia    HIV (human immunodeficiency virus infection) (HCC)    followed by ID-- dr a. wallace   Hypertension    S/P arteriovenous (AV) fistula creation 07-04-2020 @[facility]    left forearm,  07-28-2020  angioplasty of thrombosis   Secondary hyperparathyroidism of renal origin    Tachycardia    Thrombocytopenia 07/01/2020   Trigeminal neuralgia    ride side ---  neurologist--- dr onita   (10-17-2020  pt stated last episode about 4 months ago was mild)   VIN II (vulvar intraepithelial neoplasia II)     Family History  Problem Relation Age of Onset   Diabetes Mother    Healthy Father    Breast cancer Maternal Aunt    Pancreatic cancer Maternal Grandfather    Colon cancer Neg Hx    Colon polyps Neg Hx    Esophageal cancer Neg Hx    Rectal cancer Neg Hx    Stomach cancer Neg Hx    Prostate cancer Neg Hx    Endometrial cancer Neg Hx    Ovarian cancer Neg Hx     Past Surgical History:  Procedure Laterality Date   A/V FISTULAGRAM Left 07/28/2020   Procedure: A/V FISTULAGRAM;  Surgeon: Gretta Lonni PARAS, MD;  Location: [facility] INVASIVE CV LAB;  Service: Cardiovascular;  Laterality: Left;   AV FISTULA PLACEMENT Left 07/04/2020   Procedure: INSERTION OF LEFT ARTERIOVENOUS (AV) GORE-TEX GRAFT ARM (ACUSEAL);  Surgeon: Magda Debby SAILOR, MD;  Location: [facility] OR;  Service: Vascular;  Laterality: Left;   CERVICAL BIOPSY  W/ LOOP ELECTRODE EXCISION  2005   CO2 LASER APPLICATION N/A 12/22/2020   Procedure: CO2 LASER APPLICATION;  Surgeon: Eloy Herring, MD;  Location: [facility] [place];  Service: Gynecology;  Laterality: N/A;   INSERTION OF DIALYSIS CATHETER N/A 08/14/2021   Procedure: TUNNELED DIALYSIS CATHETER EXCHANGE;  Surgeon: Eliza Lonni RAMAN, MD;  Location: [facility] OR;  Service: Vascular;   Laterality: N/A;   ORIF ANKLE FRACTURE Right 2012   per pt retained hardware   PERIPHERAL VASCULAR BALLOON ANGIOPLASTY Left 07/28/2020   Procedure: PERIPHERAL VASCULAR BALLOON ANGIOPLASTY;  Surgeon: Gretta Lonni PARAS, MD;  Location: [facility] INVASIVE CV LAB;  Service: Cardiovascular;  Laterality: Left;  AVF   VULVA SURGERY  2013   excision   Social History   Occupational History   Occupation: Physicist, Medical  Tobacco Use   Smoking status: Never  Smokeless tobacco: Never  Vaping Use   Vaping status: Never Used  Substance and Sexual Activity   Alcohol use: Not Currently   Drug use: Not Currently   Sexual activity: Not Currently    Birth control/protection: Post-menopausal    Comment: declined condoms        "

## 2024-09-04 ENCOUNTER — Other Ambulatory Visit: Payer: Self-pay

## 2024-09-04 ENCOUNTER — Encounter (HOSPITAL_BASED_OUTPATIENT_CLINIC_OR_DEPARTMENT_OTHER): Payer: Self-pay | Admitting: Emergency Medicine

## 2024-09-04 ENCOUNTER — Emergency Department (HOSPITAL_BASED_OUTPATIENT_CLINIC_OR_DEPARTMENT_OTHER)

## 2024-09-04 ENCOUNTER — Inpatient Hospital Stay (HOSPITAL_BASED_OUTPATIENT_CLINIC_OR_DEPARTMENT_OTHER)
Admission: EM | Admit: 2024-09-04 | Disposition: A | Source: Home / Self Care | Attending: Internal Medicine | Admitting: Internal Medicine

## 2024-09-04 DIAGNOSIS — I2489 Other forms of acute ischemic heart disease: Secondary | ICD-10-CM

## 2024-09-04 DIAGNOSIS — E872 Acidosis, unspecified: Secondary | ICD-10-CM | POA: Diagnosis present

## 2024-09-04 DIAGNOSIS — Z21 Asymptomatic human immunodeficiency virus [HIV] infection status: Secondary | ICD-10-CM | POA: Diagnosis present

## 2024-09-04 DIAGNOSIS — R7989 Other specified abnormal findings of blood chemistry: Secondary | ICD-10-CM

## 2024-09-04 DIAGNOSIS — N185 Chronic kidney disease, stage 5: Secondary | ICD-10-CM | POA: Diagnosis present

## 2024-09-04 DIAGNOSIS — R079 Chest pain, unspecified: Secondary | ICD-10-CM

## 2024-09-04 DIAGNOSIS — D638 Anemia in other chronic diseases classified elsewhere: Secondary | ICD-10-CM | POA: Diagnosis present

## 2024-09-04 DIAGNOSIS — E875 Hyperkalemia: Secondary | ICD-10-CM | POA: Diagnosis present

## 2024-09-04 DIAGNOSIS — E7800 Pure hypercholesterolemia, unspecified: Secondary | ICD-10-CM

## 2024-09-04 DIAGNOSIS — I161 Hypertensive emergency: Secondary | ICD-10-CM

## 2024-09-04 DIAGNOSIS — I160 Hypertensive urgency: Secondary | ICD-10-CM | POA: Diagnosis present

## 2024-09-04 DIAGNOSIS — N186 End stage renal disease: Principal | ICD-10-CM

## 2024-09-04 DIAGNOSIS — I214 Non-ST elevation (NSTEMI) myocardial infarction: Secondary | ICD-10-CM | POA: Diagnosis present

## 2024-09-04 LAB — BASIC METABOLIC PANEL WITH GFR
Anion gap: 20 — ABNORMAL HIGH (ref 5–15)
BUN: 94 mg/dL — ABNORMAL HIGH (ref 6–20)
CO2: 14 mmol/L — ABNORMAL LOW (ref 22–32)
Calcium: 9.4 mg/dL (ref 8.9–10.3)
Chloride: 107 mmol/L (ref 98–111)
Creatinine, Ser: 11.9 mg/dL — ABNORMAL HIGH (ref 0.44–1.00)
GFR, Estimated: 3 mL/min — ABNORMAL LOW
Glucose, Bld: 86 mg/dL (ref 70–99)
Potassium: 5.7 mmol/L — ABNORMAL HIGH (ref 3.5–5.1)
Sodium: 140 mmol/L (ref 135–145)

## 2024-09-04 LAB — CBC
HCT: 27 % — ABNORMAL LOW (ref 36.0–46.0)
Hemoglobin: 8.6 g/dL — ABNORMAL LOW (ref 12.0–15.0)
MCH: 31.2 pg (ref 26.0–34.0)
MCHC: 31.9 g/dL (ref 30.0–36.0)
MCV: 97.8 fL (ref 80.0–100.0)
Platelets: 271 10*3/uL (ref 150–400)
RBC: 2.76 MIL/uL — ABNORMAL LOW (ref 3.87–5.11)
RDW: 14 % (ref 11.5–15.5)
WBC: 6.7 10*3/uL (ref 4.0–10.5)
nRBC: 0 % (ref 0.0–0.2)

## 2024-09-04 LAB — TROPONIN T, HIGH SENSITIVITY: Troponin T High Sensitivity: 1367 ng/L (ref 0–19)

## 2024-09-04 NOTE — ED Triage Notes (Signed)
 Chest pain and sob DOE out of breathe walking short distances Started Last week but getting worse Chest pain left side around to the back Was seen at [facility] on 09/02/2024 neg flu and covid, cxr neg Given inhaler for home with improvement

## 2024-09-05 ENCOUNTER — Inpatient Hospital Stay (HOSPITAL_COMMUNITY)

## 2024-09-05 DIAGNOSIS — I214 Non-ST elevation (NSTEMI) myocardial infarction: Secondary | ICD-10-CM | POA: Diagnosis present

## 2024-09-05 DIAGNOSIS — Z21 Asymptomatic human immunodeficiency virus [HIV] infection status: Secondary | ICD-10-CM

## 2024-09-05 DIAGNOSIS — E875 Hyperkalemia: Secondary | ICD-10-CM

## 2024-09-05 DIAGNOSIS — N185 Chronic kidney disease, stage 5: Secondary | ICD-10-CM | POA: Diagnosis present

## 2024-09-05 DIAGNOSIS — E872 Acidosis, unspecified: Secondary | ICD-10-CM

## 2024-09-05 DIAGNOSIS — D638 Anemia in other chronic diseases classified elsewhere: Secondary | ICD-10-CM | POA: Diagnosis present

## 2024-09-05 DIAGNOSIS — I160 Hypertensive urgency: Secondary | ICD-10-CM | POA: Diagnosis present

## 2024-09-05 LAB — ECHOCARDIOGRAM COMPLETE
AR max vel: 2.14 cm2
AV Area VTI: 2.44 cm2
AV Area mean vel: 2.08 cm2
AV Mean grad: 5 mmHg
AV Peak grad: 7.5 mmHg
Ao pk vel: 1.37 m/s
Area-P 1/2: 3.93 cm2
Height: 62 in
S' Lateral: 3.8 cm
Weight: 2744.29 [oz_av]

## 2024-09-05 LAB — ABO/RH: ABO/RH(D): O POS

## 2024-09-05 LAB — SEDIMENTATION RATE: Sed Rate: 100 mm/h — ABNORMAL HIGH (ref 0–22)

## 2024-09-05 LAB — BASIC METABOLIC PANEL WITH GFR
Anion gap: 20 — ABNORMAL HIGH (ref 5–15)
BUN: 93 mg/dL — ABNORMAL HIGH (ref 6–20)
CO2: 12 mmol/L — ABNORMAL LOW (ref 22–32)
Calcium: 9.4 mg/dL (ref 8.9–10.3)
Chloride: 109 mmol/L (ref 98–111)
Creatinine, Ser: 11.5 mg/dL — ABNORMAL HIGH (ref 0.44–1.00)
GFR, Estimated: 4 mL/min — ABNORMAL LOW
Glucose, Bld: 74 mg/dL (ref 70–99)
Potassium: 4.9 mmol/L (ref 3.5–5.1)
Sodium: 141 mmol/L (ref 135–145)

## 2024-09-05 LAB — HEPARIN LEVEL (UNFRACTIONATED)
Heparin Unfractionated: 0.24 [IU]/mL — ABNORMAL LOW (ref 0.30–0.70)
Heparin Unfractionated: 0.41 [IU]/mL (ref 0.30–0.70)

## 2024-09-05 LAB — CBC WITH DIFFERENTIAL/PLATELET
Abs Immature Granulocytes: 0.02 10*3/uL (ref 0.00–0.07)
Basophils Absolute: 0 10*3/uL (ref 0.0–0.1)
Basophils Relative: 1 %
Eosinophils Absolute: 0.6 10*3/uL — ABNORMAL HIGH (ref 0.0–0.5)
Eosinophils Relative: 10 %
HCT: 29.2 % — ABNORMAL LOW (ref 36.0–46.0)
Hemoglobin: 9.1 g/dL — ABNORMAL LOW (ref 12.0–15.0)
Immature Granulocytes: 0 %
Lymphocytes Relative: 15 %
Lymphs Abs: 0.9 10*3/uL (ref 0.7–4.0)
MCH: 31.2 pg (ref 26.0–34.0)
MCHC: 31.2 g/dL (ref 30.0–36.0)
MCV: 100 fL (ref 80.0–100.0)
Monocytes Absolute: 0.6 10*3/uL (ref 0.1–1.0)
Monocytes Relative: 10 %
Neutro Abs: 4.1 10*3/uL (ref 1.7–7.7)
Neutrophils Relative %: 64 %
Platelets: 233 10*3/uL (ref 150–400)
RBC: 2.92 MIL/uL — ABNORMAL LOW (ref 3.87–5.11)
RDW: 13.9 % (ref 11.5–15.5)
WBC: 6.3 10*3/uL (ref 4.0–10.5)
nRBC: 0 % (ref 0.0–0.2)

## 2024-09-05 LAB — HEPATIC FUNCTION PANEL
ALT: 12 U/L (ref 0–44)
AST: 21 U/L (ref 15–41)
Albumin: 3.5 g/dL (ref 3.5–5.0)
Alkaline Phosphatase: 112 U/L (ref 38–126)
Bilirubin, Direct: <0.1 mg/dL (ref 0.0–0.2)
Total Bilirubin: 0.4 mg/dL (ref 0.0–1.2)
Total Protein: 7 g/dL (ref 6.5–8.1)

## 2024-09-05 LAB — C-REACTIVE PROTEIN: CRP: 4.9 mg/dL — ABNORMAL HIGH

## 2024-09-05 LAB — TYPE AND SCREEN
ABO/RH(D): O POS
Antibody Screen: NEGATIVE

## 2024-09-05 LAB — PRO BRAIN NATRIURETIC PEPTIDE: Pro Brain Natriuretic Peptide: >35000 pg/mL — ABNORMAL HIGH

## 2024-09-05 LAB — PHOSPHORUS: Phosphorus: 7.6 mg/dL — ABNORMAL HIGH (ref 2.5–4.6)

## 2024-09-05 LAB — TROPONIN T, HIGH SENSITIVITY
Troponin T High Sensitivity: 1408 ng/L (ref 0–19)
Troponin T High Sensitivity: 1585 ng/L (ref 0–19)

## 2024-09-05 LAB — VITAMIN D 25 HYDROXY (VIT D DEFICIENCY, FRACTURES): Vit D, 25-Hydroxy: 12.5 ng/mL — ABNORMAL LOW (ref 30–100)

## 2024-09-05 MED ORDER — SODIUM BICARBONATE 650 MG PO TABS
1300.0000 mg | ORAL_TABLET | Freq: Three times a day (TID) | ORAL | Status: DC
  Administered 2024-09-05 – 2024-09-08 (×8): 1300 mg via ORAL
  Filled 2024-09-05 (×11): qty 2

## 2024-09-05 MED ORDER — ASPIRIN 325 MG PO TABS
325.0000 mg | ORAL_TABLET | ORAL | Status: AC
  Administered 2024-09-05: 325 mg via ORAL
  Filled 2024-09-05: qty 1

## 2024-09-05 MED ORDER — BICTEGRAVIR-EMTRICITAB-TENOFOV 50-200-25 MG PO TABS: 1.0000 | ORAL_TABLET | Freq: Every day | ORAL | Status: DC

## 2024-09-05 MED ORDER — SODIUM BICARBONATE 650 MG PO TABS: 650.0000 mg | ORAL_TABLET | Freq: Three times a day (TID) | ORAL | Status: DC

## 2024-09-05 MED ORDER — SODIUM ZIRCONIUM CYCLOSILICATE 5 G PO PACK
5.0000 g | PACK | Freq: Once | ORAL | Status: AC
  Administered 2024-09-05: 5 g via ORAL
  Filled 2024-09-05: qty 1

## 2024-09-05 MED ORDER — NITROGLYCERIN 2 % TD OINT
1.0000 [in_us] | TOPICAL_OINTMENT | Freq: Once | TRANSDERMAL | Status: AC
  Administered 2024-09-05: 1 [in_us] via TOPICAL
  Filled 2024-09-05: qty 1

## 2024-09-05 MED ORDER — CARVEDILOL 3.125 MG PO TABS
3.1250 mg | ORAL_TABLET | Freq: Two times a day (BID) | ORAL | Status: AC
  Administered 2024-09-05 – 2024-09-11 (×12): 3.125 mg via ORAL
  Filled 2024-09-05 (×13): qty 1

## 2024-09-05 MED ORDER — FUROSEMIDE 10 MG/ML IJ SOLN
80.0000 mg | Freq: Once | INTRAMUSCULAR | Status: AC
  Administered 2024-09-05: 80 mg via INTRAVENOUS
  Filled 2024-09-05: qty 8

## 2024-09-05 MED ORDER — MORPHINE SULFATE (PF) 2 MG/ML IV SOLN: 1.0000 mg | INTRAVENOUS | Status: AC | PRN

## 2024-09-05 MED ORDER — AMLODIPINE BESYLATE 10 MG PO TABS
10.0000 mg | ORAL_TABLET | Freq: Every day | ORAL | Status: AC
  Administered 2024-09-05 – 2024-09-11 (×6): 10 mg via ORAL
  Filled 2024-09-05 (×7): qty 1

## 2024-09-05 MED ORDER — HEPARIN (PORCINE) 25000 UT/250ML-% IV SOLN
950.0000 [IU]/h | INTRAVENOUS | Status: DC
  Administered 2024-09-05: 850 [IU]/h via INTRAVENOUS
  Administered 2024-09-06: 950 [IU]/h via INTRAVENOUS
  Filled 2024-09-05 (×2): qty 250

## 2024-09-05 MED ORDER — ACETAMINOPHEN 325 MG PO TABS
650.0000 mg | ORAL_TABLET | Freq: Four times a day (QID) | ORAL | Status: AC | PRN
  Administered 2024-09-05: 650 mg via ORAL
  Filled 2024-09-05: qty 2

## 2024-09-05 MED ORDER — HYDRALAZINE HCL 20 MG/ML IJ SOLN
10.0000 mg | Freq: Four times a day (QID) | INTRAMUSCULAR | Status: AC | PRN
  Administered 2024-09-05: 10 mg via INTRAVENOUS
  Filled 2024-09-05: qty 1

## 2024-09-05 MED ORDER — BICTEGRAVIR-EMTRICITAB-TENOFOV 50-200-25 MG PO TABS
1.0000 | ORAL_TABLET | Freq: Every day | ORAL | Status: AC
  Administered 2024-09-05 – 2024-09-11 (×7): 1 via ORAL
  Filled 2024-09-05 (×8): qty 1

## 2024-09-05 MED ORDER — SODIUM BICARBONATE 650 MG PO TABS: 650.0000 mg | ORAL_TABLET | Freq: Two times a day (BID) | ORAL | Status: DC

## 2024-09-05 MED ORDER — NITROGLYCERIN 0.4 MG SL SUBL: 0.4000 mg | SUBLINGUAL_TABLET | SUBLINGUAL | Status: AC | PRN

## 2024-09-05 MED ORDER — DOLUTEGRAVIR-LAMIVUDINE 50-300 MG PO TABS
1.0000 | ORAL_TABLET | Freq: Every day | ORAL | Status: DC
  Filled 2024-09-05: qty 1

## 2024-09-05 MED ORDER — SODIUM CHLORIDE 0.9% FLUSH
3.0000 mL | Freq: Two times a day (BID) | INTRAVENOUS | Status: AC
  Administered 2024-09-05 – 2024-09-11 (×12): 3 mL via INTRAVENOUS

## 2024-09-05 MED ORDER — ALBUTEROL SULFATE (2.5 MG/3ML) 0.083% IN NEBU: 2.5000 mg | INHALATION_SOLUTION | Freq: Four times a day (QID) | RESPIRATORY_TRACT | Status: AC | PRN

## 2024-09-05 MED ORDER — ASPIRIN 325 MG PO TABS: 325.0000 mg | ORAL_TABLET | Freq: Every day | ORAL | Status: DC

## 2024-09-05 MED ORDER — FLUTICASONE FUROATE-VILANTEROL 100-25 MCG/ACT IN AEPB
1.0000 | INHALATION_SPRAY | Freq: Every day | RESPIRATORY_TRACT | Status: AC
  Administered 2024-09-06 – 2024-09-10 (×5): 1 via RESPIRATORY_TRACT
  Filled 2024-09-05: qty 28

## 2024-09-05 MED ORDER — ONDANSETRON HCL 4 MG/2ML IJ SOLN: 4.0000 mg | Freq: Four times a day (QID) | INTRAMUSCULAR | Status: AC | PRN

## 2024-09-05 MED ORDER — SODIUM ZIRCONIUM CYCLOSILICATE 10 G PO PACK
10.0000 g | PACK | Freq: Once | ORAL | Status: AC
  Administered 2024-09-05: 10 g via ORAL
  Filled 2024-09-05: qty 1

## 2024-09-05 NOTE — Progress Notes (Signed)
 PHARMACY - ANTICOAGULATION CONSULT NOTE  Pharmacy Consult for heparin  Indication: chest pain/ACS  Allergies[1]  Patient Measurements: Height: 5' 2 (157.5 cm) Weight: 77.8 kg (171 lb 8.3 oz) (Wt from 09/02/2024) IBW/kg (Calculated) : 50.1 HEPARIN  DW (KG): 67.2  Vital Signs: Temp: 97.4 F (36.3 C) (01/31 0324) Temp Source: Oral (01/31 0324) BP: 174/114 (01/31 0324) Pulse Rate: 99 (01/31 0324)  Labs: Recent Labs    09/04/24 2238  HGB 8.6*  HCT 27.0*  PLT 271  CREATININE 11.90*    Estimated Creatinine Clearance: 5.5 mL/min (A) (by C-G formula based on SCr of 11.9 mg/dL (H)).  Assessment: 26 yoF presented with chest pain and shortness of breath. Pharmacy consulted to dose heparin  for ACS.  -Hg 8.6, plts 271 -No PTA oral anticoagulation  -Trop 1400  Goal of Therapy:  Heparin  level 0.3-0.7 units/ml Monitor platelets by anticoagulation protocol: Yes   Plan:  -No bolus given drop in Hgb (unsure if baseline given ESRD-not on dialysis) -Start heparin  at 850 units/hr -8h heparin  level and daily -CBC daily -F/u cards consult   Rutha Poplar, PharmD, BCPS Clinical Pharmacist 09/05/2024 3:30 AM        [1]  Allergies Allergen Reactions   Lioresal  [Baclofen ] Other (See Comments)    Intolerable dizziness   Neurontin  [Gabapentin ] Other (See Comments)    Intolerable dizziness   Penicillins Other (See Comments)    Childhood allergy Unknown reaction

## 2024-09-05 NOTE — Plan of Care (Signed)
   Problem: Education: Goal: Knowledge of General Education information will improve Description Including pain rating scale, medication(s)/side effects and non-pharmacologic comfort measures Outcome: Progressing

## 2024-09-05 NOTE — Progress Notes (Signed)
 Date and time results received: 09/05/24 at 1114   Test: TROPONIN Critical Value: 1585  Name of Provider Notified: DR CLAUDENE ON DEPARTMENT   Orders Received? Or Actions Taken?:  NO NEW ORDERS RECEIVED

## 2024-09-05 NOTE — Progress Notes (Signed)
 PHARMACY - ANTICOAGULATION CONSULT NOTE  Pharmacy Consult for heparin  Indication: chest pain/ACS  Allergies[1]  Patient Measurements: Height: 5' 2 (157.5 cm) Weight: 77.8 kg (171 lb 8.3 oz) (Wt from 09/02/2024) IBW/kg (Calculated) : 50.1 HEPARIN  DW (KG): 67.2  Vital Signs: Temp: 97.4 F (36.3 C) (01/31 2142) Temp Source: Oral (01/31 2142) BP: 158/105 (01/31 2100) Pulse Rate: 78 (01/31 2100)  Labs: Recent Labs    09/04/24 2238 09/05/24 0921 09/05/24 0922 09/05/24 1245 09/05/24 2156  HGB 8.6* 9.1*  --   --   --   HCT 27.0* 29.2*  --   --   --   PLT 271 233  --   --   --   HEPARINUNFRC  --   --   --  0.24* 0.41  CREATININE 11.90*  --  11.50*  --   --     Estimated Creatinine Clearance: 5.7 mL/min (A) (by C-G formula based on SCr of 11.5 mg/dL (H)).  Assessment: 24 yoF presented with chest pain and shortness of breath. No PTA oral anticoagulation. Pharmacy consulted to dose heparin  for ACS.  Heparin  level therapeutic (0.41) on infusion at 1000 units/hr. No bleeding noted.  Goal of Therapy:  Heparin  level 0.3-0.7 units/ml Monitor platelets by anticoagulation protocol: Yes   Plan:  Continue heparin  at 1000 units/hr F/u daily heparin  level and CBC  Vito Ralph, PharmD, BCPS Please see amion for complete clinical pharmacist phone list 09/05/2024 10:24 PM          [1]  Allergies Allergen Reactions   Lioresal  [Baclofen ] Other (See Comments)    Intolerable dizziness   Neurontin  [Gabapentin ] Other (See Comments)    Intolerable dizziness   Penicillins Other (See Comments)    Childhood allergy Unknown reaction

## 2024-09-05 NOTE — Progress Notes (Signed)
 PHARMACY - ANTICOAGULATION CONSULT NOTE  Pharmacy Consult for heparin  Indication: chest pain/ACS  Allergies[1]  Patient Measurements: Height: 5' 2 (157.5 cm) Weight: 77.8 kg (171 lb 8.3 oz) (Wt from 09/02/2024) IBW/kg (Calculated) : 50.1 HEPARIN  DW (KG): 67.2  Vital Signs: Temp: 97.8 F (36.6 C) (01/31 1212) Temp Source: Oral (01/31 1212) BP: 145/93 (01/31 1212) Pulse Rate: 87 (01/31 1212)  Labs: Recent Labs    09/04/24 2238 09/05/24 0921 09/05/24 0922 09/05/24 1245  HGB 8.6* 9.1*  --   --   HCT 27.0* 29.2*  --   --   PLT 271 233  --   --   HEPARINUNFRC  --   --   --  0.24*  CREATININE 11.90*  --  11.50*  --     Estimated Creatinine Clearance: 5.7 mL/min (A) (by C-G formula based on SCr of 11.5 mg/dL (H)).  Assessment: 11 yoF presented with chest pain and shortness of breath. No PTA oral anticoagulation. Pharmacy consulted to dose heparin  for ACS.  Heparin  level subtherapeutic at 0.24. N oissues with the infusion or signs/sx bleeding per RN. Hgb trending up but still below baseline. Trop 1400  Goal of Therapy:  Heparin  level 0.3-0.7 units/ml Monitor platelets by anticoagulation protocol: Yes   Plan:  -No bolus given drop in Hgb (unsure if baseline given ESRD-not on dialysis) -Increase heparin  to 1000 units/hr -8h heparin  level and daily -CBC daily -F/u cards consult   Elma Fail, PharmD PGY1 Clinical Pharmacist [facility]  09/05/2024 1:52 PM         [1]  Allergies Allergen Reactions   Lioresal  [Baclofen ] Other (See Comments)    Intolerable dizziness   Neurontin  [Gabapentin ] Other (See Comments)    Intolerable dizziness   Penicillins Other (See Comments)    Childhood allergy Unknown reaction

## 2024-09-05 NOTE — Consult Note (Addendum)
 Reason for Consult: CKD5 Referring Physician:  Dr. Madison Peaches  Chief Complaint: Chest pain  Assessment/Plan: CKD5 - She has been followed at [facility] q6-8weeks for many years, resistant to permanent access after  failed attempt x1 (lt FAL AVG) despite multiple attempts to encourage another attempt. She is interested in PD but has canceled appts many times because her mother not being able to come down to help with transport etc. Finally was willing to see [facility] again for permanent access but still has not see them. - No absolute indication for dialysis; will give Lokelma  another dose later today. - Registered dietician to counsel on renal diet; may need Lokelma  2-3x/week as well - incr bicarb to 650mg  2 tabs TID; wondering about compliance.   - Dose meds for GFR < 39ml/min -Monitor Daily I/Os, Daily weight  -Maintain MAP>65 for optimal renal perfusion.  - Avoid nephrotoxic agents such as IV contrast, NSAIDs, and phosphate containing bowel preps (FLEETS)  Pre-emptive renal transplant - She is currently being worked up at [facility] but they have had concerns with [place] and prior high HIV viral loads. I've actually forwarded results of undetectable viral loads in recent times to [facility]. She also needs a colonoscopy but [facility] (has not called to resolve and get the necessary colonoscopy). Chest pain with elevated troponin on heparin  gtt, nitropaste with improvement in CP. Wonder if demand ischemia was playing a role; out of BP meds for a few days/week (see below) Hypertension - restart amlodipine , historically on higher side of normal range. Patient had been out of antihypertensives and never called the clinic for refill. 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 HIV now back on meds    HPI: Brooke Baldwin is an 49 y.o. female with a PMH HIV, FSGS, trigeminal neuralgia, uterine fibroids, hypertension, and medical non compliance.  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  Kidney function was normal in 2016.  Renal US  with smaller kidneys and increased echogenicity.  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  She was started on short-term prednisone  in response to the MPO antibodies.  Renal biopsy completed in early September, found to have FSGS with mild glomerulomegaly, moderate arteriolosclerosis with diffuse mild to moderate TI scarring.  There was no evidence of immune complex disease.  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 She required HD in December of 2021 but was non compliant with dialysis, sometimes missing >1 month of treatment. Ultimately had wanted to do PD, but when she went to have PD cath placed in Sept 2022 was noted to have large pelvic mass and surgery was aborted (uterine fibroid).    Her TDC was removed in March 2023 due to possible infection and she left the hospital prior to  having a new one placed had been without dialysis since that time.   07/14/24 vitamin D  is 8.8, creatinine 12.9, potassium 5.3, hemoglobin 10.1, adequate iron  stores, PTH 1028  She was out of antihypertensives and never called the clinic for another rx to be called in; her PCP is in [place] and she keeps traveling back for that. Also has stress from her job with [employer].  She is now coming  in with shortness of breath especially with exertion and chest pain, chest pain radiating to the back negative for COVID and flu; she denies any fever, chills, nausea, vomiting.  She was being worked up for a brief dialysis transplant at [facility] but it was stalled by the need for a GI workup and she has had issues getting her mother to help coordinate transportation for the study as well as issues with outstanding bills precluding further workup.  We have tried to get her a permanent access in case she needs to start dialysis but she has been absolutely resistant despite multiple visits with extensive counseling and education.  She is not here with a NSTEMI with potassium of 5.7 and BUN/creatinine of 94/11.9 with anion gap acidosis, troponin level was 1367 and rising to 3500.  Hemoglobin is 8.6 and she is not on any ESA.  Patient was given Lokelma  and started on heparin  as well as a Nitropaste applied.    ROS Pertinent items are noted in HPI.  Chemistry and CBC: Creat  Date/Time Value Ref Range Status  11/12/2023 10:07 AM 8.68 (H) 0.50 - 0.99 mg/dL Final    Comment:    Verified by repeat analysis. [name]   04/12/2022 09:39 AM 5.29 (H) 0.50 - 0.99 mg/dL Final  97/77/7976 90:69 AM 6.10 (H) 0.50 - 0.99 mg/dL Final  90/70/7977 89:98 AM 7.36 (H) 0.50 - 0.99 mg/dL Final  98/85/7977 87:99 AM 4.26 (H) 0.50 - 1.10 mg/dL Final    Comment:    Verified by repeat analysis. .    Creatinine, Ser  Date/Time Value Ref Range Status  09/04/2024 10:38 PM 11.90 (H) 0.44 - 1.00 mg/dL Final  98/82/7975 93:49 PM 5.76 (H) 0.44 - 1.00 mg/dL Final  88/89/7976 97:90 PM 5.76 (H) 0.44 - 1.00 mg/dL Final  94/74/7976 98:97 AM 5.61 (H) 0.44 - 1.00 mg/dL Final  94/75/7976 97:82 PM 5.99 (H) 0.44 - 1.00 mg/dL Final  96/77/7976 94:91 AM 6.59 (H) 0.44 - 1.00 mg/dL Final  96/78/7976 87:99 PM 6.44 (H) 0.44 - 1.00 mg/dL Final  96/81/7976 88:50 PM 6.46 (H) 0.44 - 1.00 mg/dL Final  96/81/7976 93:76 PM 6.62 (H) 0.44 - 1.00 mg/dL  Final  98/90/7976 94:50 AM 6.82 (H) 0.44 - 1.00 mg/dL Final  98/91/7976 94:99 AM 6.03 (H) 0.44 - 1.00 mg/dL Final  98/92/7976 91:67 PM 6.48 (H) 0.44 - 1.00 mg/dL Final  98/92/7976 98:49 PM 7.28 (H) 0.44 - 1.00 mg/dL Final  98/92/7976 87:88 PM 7.80 (H) 0.44 - 1.00 mg/dL Final  98/92/7976 88:85 AM 7.28 (H) 0.44 - 1.00 mg/dL Final  90/97/7977 90:83 PM 7.26 (H) 0.44 - 1.00 mg/dL Final  94/80/7977 90:88 AM 10.60 (H) 0.44 - 1.00 mg/dL Final  87/76/7978 90:83 AM 7.20 (H) 0.44 - 1.00 mg/dL Final  87/81/7978 93:92 PM 4.48 (H) 0.44 - 1.00 mg/dL Final  87/92/7978 96:45 AM 11.38 (H) 0.44 - 1.00 mg/dL Final  87/93/7978 94:92 AM 10.16 (H) 0.44 - 1.00 mg/dL Final  87/94/7978 98:51 AM 13.10 (H) 0.44 - 1.00 mg/dL Final  87/95/7978 96:82 AM 12.37 (H) 0.44 -  1.00 mg/dL Final  87/96/7978 96:78 AM 12.32 (H) 0.44 - 1.00 mg/dL Final  87/97/7978 95:60 AM 11.80 (H) 0.44 - 1.00 mg/dL Final  87/98/7978 87:59 AM 10.99 (H) 0.44 - 1.00 mg/dL Final  88/69/7978 91:44 AM 10.48 (H) 0.44 - 1.00 mg/dL Final  88/69/7978 95:73 AM 10.51 (H) 0.44 - 1.00 mg/dL Final  88/70/7978 97:66 AM 9.82 (H) 0.44 - 1.00 mg/dL Final  88/71/7978 95:97 AM 9.16 (H) 0.44 - 1.00 mg/dL Final  88/72/7978 96:93 AM 9.42 (H) 0.44 - 1.00 mg/dL Final  88/73/7978 96:88 PM 9.44 (H) 0.44 - 1.00 mg/dL Final  88/73/7978 91:91 AM 9.24 (H) 0.44 - 1.00 mg/dL Final  88/74/7978 88:66 AM 10.02 (H) 0.44 - 1.00 mg/dL Final  94/88/7978 91:51 AM 2.06 (H) 0.57 - 1.00 mg/dL Final  94/94/7978 90:45 AM 1.85 (H) 0.57 - 1.00 mg/dL Final   Recent Labs  Lab 09/04/24 2238  NA 140  K 5.7*  CL 107  CO2 14*  GLUCOSE 86  BUN 94*  CREATININE 11.90*  CALCIUM  9.4   Recent Labs  Lab 09/04/24 2238  WBC 6.7  HGB 8.6*  HCT 27.0*  MCV 97.8  PLT 271   Liver Function Tests: No results for input(s): AST, ALT, ALKPHOS, BILITOT, PROT, ALBUMIN  in the last 168 hours. No results for input(s): LIPASE, AMYLASE in the last 168 hours. No results for input(s):  AMMONIA in the last 168 hours. Cardiac Enzymes: No results for input(s): CKTOTAL, CKMB, CKMBINDEX, TROPONINI in the last 168 hours. Iron  Studies: No results for input(s): IRON , TIBC, TRANSFERRIN, FERRITIN in the last 72 hours. PT/INR: @LABRCNTIP (inr:5)  Xrays/Other Studies: ) Results for orders placed or performed during the hospital encounter of 09/04/24 (from the past 48 hours)  Basic metabolic panel     Status: Abnormal   Collection Time: 09/04/24 10:38 PM  Result Value Ref Range   Sodium 140 135 - 145 mmol/L   Potassium 5.7 (H) 3.5 - 5.1 mmol/L   Chloride 107 98 - 111 mmol/L   CO2 14 (L) 22 - 32 mmol/L   Glucose, Bld 86 70 - 99 mg/dL    Comment: Glucose reference range applies only to samples taken after fasting for at least 8 hours.   BUN 94 (H) 6 - 20 mg/dL   Creatinine, Ser 88.09 (H) 0.44 - 1.00 mg/dL   Calcium  9.4 8.9 - 10.3 mg/dL   GFR, Estimated 3 (L) >60 mL/min    Comment: (NOTE) Calculated using the CKD-EPI Creatinine Equation (2021)    Anion gap 20 (H) 5 - 15    Comment: Performed at [facility], [address], [place], [place] [address]  CBC     Status: Abnormal   Collection Time: 09/04/24 10:38 PM  Result Value Ref Range   WBC 6.7 4.0 - 10.5 K/uL   RBC 2.76 (L) 3.87 - 5.11 MIL/uL   Hemoglobin 8.6 (L) 12.0 - 15.0 g/dL   HCT 72.9 (L) 63.9 - 53.9 %   MCV 97.8 80.0 - 100.0 fL   MCH 31.2 26.0 - 34.0 pg   MCHC 31.9 30.0 - 36.0 g/dL   RDW 85.9 88.4 - 84.4 %   Platelets 271 150 - 400 K/uL   nRBC 0.0 0.0 - 0.2 %    Comment: Performed at [facility], [address] [place], [place] [address]  Troponin T, High Sensitivity     Status: Abnormal   Collection Time: 09/04/24 10:38 PM  Result Value Ref Range   Troponin T High  Sensitivity 1,367 (HH) 0 - 19 ng/L    Comment: Critical Value, Read Back and verified with Delanna Polite RN,09/04/2024 @ 2358 by ADELBERT (NOTE) Biotin concentrations > 1000 ng/mL falsely  decrease TnT results.  Serial cardiac troponin measurements are suggested.  Refer to the Links section for chest pain algorithms and additional  guidance. Performed at [facility], [address] [place], [place] [address]   Troponin T, High Sensitivity     Status: Abnormal   Collection Time: 09/05/24 12:38 AM  Result Value Ref Range   Troponin T High Sensitivity 1,408 (HH) 0 - 19 ng/L    Comment: Critical Value, Read Back and verified with CAMERON C., RN [phone] AD (NOTE) Biotin concentrations > 1000 ng/mL falsely decrease TnT results.  Serial cardiac troponin measurements are suggested.  Refer to the Links section for chest pain algorithms and additional  guidance. Performed at [facility], [address] [place], [place] [address]   Pro Brain natriuretic peptide     Status: Abnormal   Collection Time: 09/05/24 12:48 AM  Result Value Ref Range   Pro Brain Natriuretic Peptide >35,000.0 (H) <300.0 pg/mL    Comment: (NOTE) Age Group        Cut-Points    Interpretation  < 50 years     450 pg/mL       NT-proBNP > 450 pg/mL indicates                                ADHF is likely              50 to 75 years  900 pg/mL      NT-proBNP > 900 pg/mL indicates          ADHF is likely  > 75 years      1800 pg/mL     NT-proBNP > 1800 pg/mL indicates          ADHF is likely                           All ages    Results between       Indeterminate. Further clinical             300 and the cut-   information is needed to determine            point for age group   if ADHF is present.                                                             Elecsys proBNP II/ Elecsys proBNP II STAT           Cut-Point                       Interpretation  300 pg/mL                    NT-proBNP <300pg/mL indicates                             ADHF is not likely  Performed at [facility], [address],  [place], [place] [address]     DG Chest Port 1 View Result Date: 09/05/2024 EXAM: 1 VIEW(S) XRAY OF THE CHEST 09/04/2024 10:59:00 PM COMPARISON: 06/26/2024 CLINICAL HISTORY: Chest pain. FINDINGS: LUNGS AND PLEURA: No focal pulmonary opacity. Blunting of the left costophrenic angle. No pneumothorax. HEART AND MEDIASTINUM: No acute abnormality of the cardiac and mediastinal silhouettes. BONES AND SOFT TISSUES: 2.8 cm calcified density in left thoracic inlet may represent calcified lymph node or calcified thyroid  nodule. No acute osseous abnormality. IMPRESSION: 1. Blunting of the left costophrenic angle may represent small pleural effusion . 2. 2.8 cm calcified density in the left thoracic inlet, which may represent a calcified lymph node or calcified thyroid  nodule. Electronically signed by: Greig Pique MD 09/05/2024 12:14 AM EST RP Workstation: [id number]    PMH:   Past Medical History:  Diagnosis Date   Anemia associated with chronic renal failure    Atrophic kidney    bilateral   CIN I (cervical intraepithelial neoplasia I)    ESRD (end stage renal disease) on dialysis (HCC) 05/2020   hemodialysis  T/ Th/ Sat  @ [facility] in [place], ( 10-17-2020  s/p left AVG 11/ 2021 unable to use as of yet, currently using right IJ tunneled cath (right side of upper chest) for dialysis   FSGS (focal segmental glomerulosclerosis)    renal biopsy 04-15-2020 in epic , secondary to HTN/ HIV/ Obese   Heart palpitations 11/ 2021  (10-17-2020  pt stated the palpitations does not bother her and has no other symptoms , stated they started 11/ 2021)   pt was been seen by cardiology, dr s. emelie ([facility] in [place]),  per pt had event monitor for 17 days , was told normal with  no arrhythmias,  and stated echo is scheduled for 10-28-2020   History of vulvar dysplasia    HIV (human immunodeficiency virus infection) (HCC)    followed by ID-- dr a. wallace   Hypertension    S/P arteriovenous (AV) fistula creation 07-04-2020 @[facility]     left forearm,  07-28-2020  angioplasty of thrombosis   Secondary hyperparathyroidism of renal origin    Tachycardia    Thrombocytopenia 07/01/2020   Trigeminal neuralgia    ride side ---  neurologist--- dr onita   (10-17-2020  pt stated last episode about 4 months ago was mild)   VIN II (vulvar intraepithelial neoplasia II)     PSH:   Past Surgical History:  Procedure Laterality Date   A/V FISTULAGRAM Left 07/28/2020   Procedure: A/V FISTULAGRAM;  Surgeon: Gretta Lonni PARAS, MD;  Location: [facility] INVASIVE CV LAB;  Service: Cardiovascular;  Laterality: Left;   AV FISTULA PLACEMENT Left 07/04/2020   Procedure: INSERTION OF LEFT ARTERIOVENOUS (AV) GORE-TEX GRAFT ARM (ACUSEAL);  Surgeon: Magda Debby SAILOR, MD;  Location: [facility] OR;  Service: Vascular;  Laterality: Left;   CERVICAL BIOPSY  W/ LOOP ELECTRODE EXCISION  2005   CO2 LASER APPLICATION N/A 12/22/2020   Procedure: CO2 LASER APPLICATION;  Surgeon: Eloy Herring, MD;  Location: [facility];  Service: Gynecology;  Laterality: N/A;   INSERTION OF DIALYSIS CATHETER N/A 08/14/2021   Procedure: TUNNELED DIALYSIS CATHETER EXCHANGE;  Surgeon: Eliza Lonni RAMAN, MD;  Location: [facility] OR;  Service: Vascular;  Laterality: N/A;   ORIF ANKLE FRACTURE Right 2012   per pt retained hardware   PERIPHERAL VASCULAR BALLOON ANGIOPLASTY Left 07/28/2020   Procedure: PERIPHERAL VASCULAR BALLOON ANGIOPLASTY;  Surgeon: Gretta Lonni PARAS, MD;  Location: [facility] INVASIVE CV LAB;  Service: Cardiovascular;  Laterality: Left;  AVF   VULVA SURGERY  2013   excision    Allergies: Allergies[1]  Medications:   Prior to Admission medications  Medication Sig Start Date End Date Taking? Authorizing Provider  albuterol  (VENTOLIN  HFA) 108 (90 Base) MCG/ACT inhaler Inhale 1-2 puffs into the lungs every 6 (six) hours as needed for wheezing or shortness of breath. 06/15/22   Logan Ubaldo NOVAK, PA-C  amLODipine  (NORVASC ) 10 MG tablet Take 1 tablet (10 mg total) by mouth  daily. 12/28/21 05/01/23  Sebastian Toribio GAILS, MD  bictegravir-emtricitabine -tenofovir  AF (BIKTARVY ) 50-200-25 MG TABS tablet Take 1 tablet by mouth daily. 06/01/24 05/27/25  Dennise Kingsley, MD  budesonide -formoterol  (SYMBICORT ) 80-4.5 MCG/ACT inhaler Inhale 2 puffs into the lungs 2 (two) times daily. 06/26/24   Haze Lonni PARAS, MD  doxycycline  (VIBRAMYCIN ) 100 MG capsule Take 1 capsule (100 mg total) by mouth 2 (two) times daily. 06/26/24   Haze Lonni PARAS, MD  ondansetron  (ZOFRAN -ODT) 4 MG disintegrating tablet 4mg  ODT q4 hours prn nausea/vomit 06/26/24   Pollina, Lonni PARAS, MD  predniSONE  (DELTASONE ) 20 MG tablet Take 2 tablets (40 mg total) by mouth daily with breakfast. 06/26/24   Pollina, Lonni PARAS, MD  sodium bicarbonate  650 MG tablet Take 1 tablet (650 mg total) by mouth 2 (two) times daily. 12/28/21   Sebastian Toribio GAILS, MD    Discontinued Meds:  There are no discontinued medications.  Social History:  reports that she has never smoked. She has never used smokeless tobacco. She reports that she does not currently use alcohol. She reports that she does not currently use drugs.  Family History:   Family History  Problem Relation Age of Onset   Diabetes Mother    Healthy Father    Breast cancer Maternal Aunt    Pancreatic cancer Maternal Grandfather    Colon cancer Neg Hx    Colon polyps Neg Hx    Esophageal cancer Neg Hx    Rectal cancer Neg Hx    Stomach cancer Neg Hx    Prostate cancer Neg Hx    Endometrial cancer Neg Hx    Ovarian cancer Neg Hx     Blood pressure (!) 181/108, pulse 93, temperature (!) 97.4 F (36.3 C), temperature source Oral, resp. rate 19, height 5' 2 (1.575 m), weight 77.8 kg, last menstrual period 04/02/2015, SpO2 100%. General appearance: alert, cooperative, and appears stated age Head: Normocephalic, without obvious abnormality, atraumatic Eyes: negative Neck: no adenopathy, no carotid bruit, supple, symmetrical, trachea midline,  and thyroid  not enlarged, symmetric, no tenderness/mass/nodules Back: symmetric, no curvature. ROM normal. No CVA tenderness. Resp: wheezing on right Cardio: regular rate and rhythm GI: soft, non-tender; bowel sounds normal; no masses,  no organomegaly Extremities: extremities normal, atraumatic, no cyanosis or edema Pulses: 2+ and symmetric Lt FAL thrombosed       Beckey Polkowski, LYNWOOD ORN, MD 09/05/2024, 4:31 AM      [1]  Allergies Allergen Reactions   Lioresal  [Baclofen ] Other (See Comments)    Intolerable dizziness   Neurontin  [Gabapentin ] Other (See Comments)    Intolerable dizziness   Penicillins Other (See Comments)    Childhood allergy Unknown reaction

## 2024-09-05 NOTE — Progress Notes (Addendum)
 Plan of Care Note for accepted transfer   Patient: Brooke Baldwin MRN: [id number]   DOA: 09/04/2024  Facility requesting transfer: [facility] Requesting Provider: Bari Pfeiffer, MD Reason for transfer: Chest pain, dyspnea, AKI,?  Non-STEMI Facility course: This is a 49 year old female with chronic kidney disease who presents for shortness of breath and chest pain.  Patient reports that she has had increasing dyspnea on exertion over the last week.  She states she can barely walk up steps.  She also reports difficulty lying flat.  She has had some left-sided chest pain that is radiating to the back.  She was seen and evaluated urgent care and tested negative for COVID and flu.  Chest x-ray was reassuring.  She has not had any fevers.  She has been using an inhaler at home but this does not seem to help.  She is not currently on dialysis.  She gets seen every 6 weeks by her primary nephrologist and is also being worked up for prekidney transplant at [facility].  No recent cardiology evaluation.   When the patient came to the ER BP was 183/114 with otherwise normal vital signs.Labs revealed hyperkalemia 5.7 and a BUN of 94 with a creatinine of 11.9 and anion gap was 20.  High-sensitivity troponin I was 1367 and later 1408 and proBNP more than 3500.  CBC showed hemoglobin 8.6 and hematocrit 27.  Portable chest x-ray showed: 1. Blunting of the left costophrenic angle may represent small pleural effusion . 2. 2.8 cm calcified density in the left thoracic inlet, which may represent a calcified lymph node or calcified thyroid  nodule.  1. Blunting of the left costophrenic angle may represent small pleural effusion . 2. 2.8 cm calcified density in the left thoracic inlet, which may represent a calcified lymph node or calcified thyroid  nodule.  EKG showed sinus rhythm with a rate of 99 with probable left atrial enlargement and ST segment elevation in V1 and V2 that was thought to be baseline when  drink.  Repeat EKG showed sinus rhythm with a rate of 97 with no ST segment elevation.  The patient was given p.o. Lokelma  and I recommended starting IV heparin  and 1 inch of Nitropaste.  Contact was made with Dr. Render with cardiology who can be notified when the patient arrives to [facility] and Dr. Melia with nephrology  Plan of care: The patient is accepted for admission to Progressive unit, at [facility]..  The patient will be under the responsibility and care of the ED physician until arrival to [facility].  Author: Madison DELENA Peaches, MD 09/05/2024  Check [email or web address] for on-call coverage.  Nursing staff, Please call [facility] Admits & Consults System-Wide number on Amion as soon as patient's arrival, so appropriate admitting provider can evaluate the pt.

## 2024-09-05 NOTE — H&P (Signed)
 " History and Physical    Patient: Brooke Baldwin FMW:[id number] DOB: [birth year] DOA: 09/04/2024 DOS: the patient was seen and examined on 09/05/2024 PCP: Cleatus Donovan RAMAN, MD  Patient coming from: Home  Chief Complaint:  Chief Complaint  Patient presents with   Chest Pain   HPI: Brooke Baldwin is a 49 y.o. 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  She has been experiencing chest discomfort for about a week, describing it as a constant pressure similar to 'someone standing on my chest.' The discomfort worsens when lying down and improves when sitting up. No leg swelling or weight gain is associated with these symptoms.  She has a history of bronchitis, with symptoms typically worsening around November. Since then, she has had a persistent dry cough, which is non-productive. No fever is reported.  She was previously on dialysis but has been off it since 2023. She has a non-functioning graft in the left forearm and expresses uncertainty about the need to resume dialysis, noting she does not feel swollen despite some fluid concerns noted in recent tests.  In the emergency department blood pressures elevated up to 183/114 with all other vital signs relatively within normal limits.  Labs significant for potassium 5.7, CO2 14, BUN 94, creatinine 11.9, anion gap 20, high-sensitivity troponin 1367-> 1408,  proBNP greater than 35,000, and hemoglobin 8.6.  Chest x-ray noted blunting of the left costophrenic angle thought to represent a small pleural effusion and 2.8 cm calcified density in the left thoracic inlet.  Nitroglycerin  ointment was applied to the patient's chest.  She was given Lokelma  5 g p.o., full dose aspirin , and started on a heparin  drip.  Review of Systems: As mentioned in the history of present illness. All other systems reviewed and are  negative. Past Medical History:  Diagnosis Date   Anemia associated with chronic renal failure    Atrophic kidney    bilateral   CIN I (cervical intraepithelial neoplasia I)    ESRD (end stage renal disease) on dialysis (HCC) 05/2020   hemodialysis  T/ Th/ Sat  @ [facility] ([address] in [place], ( 10-17-2020  s/p left AVG 11/ 2021 unable to use as of yet, currently using right IJ tunneled cath (right side of upper chest) for dialysis   FSGS (focal segmental glomerulosclerosis)    renal biopsy 04-15-2020 in epic , secondary to HTN/ HIV/ Obese   Heart palpitations 11/ 2021  (10-17-2020  pt stated the palpitations does not bother her and has no other symptoms , stated they started 11/ 2021)   pt was been seen by cardiology, dr s. emelie ([facility] in [place]),  per pt had event monitor for 17 days , was told normal with  no arrhythmias,  and stated echo is scheduled for 10-28-2020   History of vulvar dysplasia    HIV (human immunodeficiency virus infection) (HCC)    followed by ID-- dr a. wallace   Hypertension    S/P arteriovenous (AV) fistula creation 07-04-2020 @[facility]    left forearm,  07-28-2020  angioplasty of thrombosis   Secondary hyperparathyroidism of renal origin    Tachycardia    Thrombocytopenia 07/01/2020   Trigeminal neuralgia    ride side ---  neurologist--- dr onita   (10-17-2020  pt stated last episode about 4 months ago was mild)   VIN II (vulvar intraepithelial neoplasia II)    Past Surgical  History:  Procedure Laterality Date   A/V FISTULAGRAM Left 07/28/2020   Procedure: A/V FISTULAGRAM;  Surgeon: Gretta Lonni PARAS, MD;  Location: [facility] INVASIVE CV LAB;  Service: Cardiovascular;  Laterality: Left;   AV FISTULA PLACEMENT Left 07/04/2020   Procedure: INSERTION OF LEFT ARTERIOVENOUS (AV) GORE-TEX GRAFT ARM (ACUSEAL);  Surgeon: Magda Debby SAILOR, MD;  Location: [facility] OR;  Service: Vascular;  Laterality: Left;   CERVICAL BIOPSY  W/ LOOP ELECTRODE EXCISION  2005   CO2 LASER  APPLICATION N/A 12/22/2020   Procedure: CO2 LASER APPLICATION;  Surgeon: Eloy Herring, MD;  Location: [facility];  Service: Gynecology;  Laterality: N/A;   INSERTION OF DIALYSIS CATHETER N/A 08/14/2021   Procedure: TUNNELED DIALYSIS CATHETER EXCHANGE;  Surgeon: Eliza Lonni RAMAN, MD;  Location: [facility] OR;  Service: Vascular;  Laterality: N/A;   ORIF ANKLE FRACTURE Right 2012   per pt retained hardware   PERIPHERAL VASCULAR BALLOON ANGIOPLASTY Left 07/28/2020   Procedure: PERIPHERAL VASCULAR BALLOON ANGIOPLASTY;  Surgeon: Gretta Lonni PARAS, MD;  Location: [facility] INVASIVE CV LAB;  Service: Cardiovascular;  Laterality: Left;  AVF   VULVA SURGERY  2013   excision   Social History:  reports that she has never smoked. She has never used smokeless tobacco. She reports that she does not currently use alcohol. She reports that she does not currently use drugs.  Allergies[1]  Family History  Problem Relation Age of Onset   Diabetes Mother    Healthy Father    Breast cancer Maternal Aunt    Pancreatic cancer Maternal Grandfather    Colon cancer Neg Hx    Colon polyps Neg Hx    Esophageal cancer Neg Hx    Rectal cancer Neg Hx    Stomach cancer Neg Hx    Prostate cancer Neg Hx    Endometrial cancer Neg Hx    Ovarian cancer Neg Hx     Prior to Admission medications  Medication Sig Start Date End Date Taking? Authorizing Provider  albuterol  (VENTOLIN  HFA) 108 (90 Base) MCG/ACT inhaler Inhale 1-2 puffs into the lungs every 6 (six) hours as needed for wheezing or shortness of breath. 06/15/22   Logan Ubaldo NOVAK, PA-C  amLODipine  (NORVASC ) 10 MG tablet Take 1 tablet (10 mg total) by mouth daily. 12/28/21 05/01/23  Sebastian Toribio GAILS, MD  bictegravir-emtricitabine -tenofovir  AF (BIKTARVY ) 50-200-25 MG TABS tablet Take 1 tablet by mouth daily. 06/01/24 05/27/25  Dennise Kingsley, MD  budesonide -formoterol  (SYMBICORT ) 80-4.5 MCG/ACT inhaler Inhale 2 puffs into the lungs 2 (two) times daily.  06/26/24   Haze Lonni PARAS, MD  doxycycline  (VIBRAMYCIN ) 100 MG capsule Take 1 capsule (100 mg total) by mouth 2 (two) times daily. 06/26/24   Haze Lonni PARAS, MD  ondansetron  (ZOFRAN -ODT) 4 MG disintegrating tablet 4mg  ODT q4 hours prn nausea/vomit 06/26/24   Pollina, Lonni PARAS, MD  predniSONE  (DELTASONE ) 20 MG tablet Take 2 tablets (40 mg total) by mouth daily with breakfast. 06/26/24   Pollina, Lonni PARAS, MD  sodium bicarbonate  650 MG tablet Take 1 tablet (650 mg total) by mouth 2 (two) times daily. 12/28/21   Sebastian Toribio GAILS, MD    Physical Exam: Vitals:   09/05/24 0400 09/05/24 0532 09/05/24 0601 09/05/24 0632  BP: (!) 173/111 (!) 178/124 (!) 182/115 108/66  Pulse: 97 94  69  Resp: 19 18    Temp:  98.2 F (36.8 C)    TempSrc:  Oral    SpO2: 99% 100%  100%  Weight:  Height:       Constitutional: Middle-aged female currently no acute distress Eyes: PERRL, lids and conjunctivae normal ENMT: Mucous membranes are moist. Posterior pharynx clear of any exudate or lesions.Normal dentition.  Neck: normal, supple, no JVD Respiratory: clear to auscultation bilaterally, no wheezing, no crackles. Normal respiratory effort. No accessory muscle use.  Cardiovascular: Regular rate and rhythm, no murmurs / rubs / gallops. No extremity edema. 2+ pedal pulses.  Left forearm fistula without palpable thrill. Abdomen: no tenderness, no masses palpated.  Bowel sounds positive.  Musculoskeletal: no clubbing / cyanosis. No joint deformity upper and lower extremities. Good ROM, no contractures. Normal muscle tone.  Skin: no rashes, lesions, ulcers. No induration Neurologic: CN 2-12 grossly intact.  Strength 5/5 in all 4.  Psychiatric: Normal judgment and insight. Alert and oriented x 3. Normal mood.   Data Reviewed:  EKG showed sinus rhythm with a rate of 99 with probable left atrial enlargement and ST segment elevation in V1 and V2.  Repeat EKG showed sinus rhythm with a rate  of 97 with no ST segment elevation. Reviewed labs, imaging, and pertinent records as documented.  Assessment and Plan:  NSTEMI Acute.  Presented with complaints of chest pain and reports a pressure.  High-sensitivity troponin 1367-> 1408.  Initial EKG showed some ST wave segment elevation in V1 and V2, but  not present with repeat EKG.  Patient was given full dose aspirin , nitroglycerin  paste, and started on a heparin  drip. - Admit to a progressive bed - Continue heparin  drip for pharmacy - Check echocardiogram - Cardiology consulted, follow-up for any further recommendations  Fluid overload Chronic kidney disease stage V Metabolic acidosis with increased anion gap Patient presents with complaints of shortness of breath and chest pain.  Labs significant for CO2 14, BUN 94, creatinine 11.9, anion gap 20, and proBNP greater than 3500.  Chest x-ray shows blunting of the left costophrenic angle concerning for possible small pleural effusion.  She has not been on dialysis since 2023. - Strict I&O's and daily weights - Follow-up urine studies - Lasix  80 mg IV x 1 dose - Nephrology consulted, follow-up for any further recommendations.  Hyperkalemia Acute.  Initial potassium elevated at 5.7.  Patient had been given Lokelma  5 g p.o. - Given additional dose of Lokelma   Hypertensive urgency Blood pressure noted to be 183/114.    - Continue amlodipine  - Hydralazine  IV as needed for elevated blood pressure  Anemia of chronic disease Acute.  Hemoglobin noted to be 8.6 which appears lower than prior of 12.3 when checked on 10/18/2022. - Type and screen possible need of blood products - Recheck H&H  HIV HIV RNA noted to be undetectable when last checked on 11/12/2023.  Patient had been on Biktarvy  previously. - Pharmacy recommends switching to Dovato  due to kidney function, but patient declined the change  and requested to be continued on Biktarvy   DVT prophylaxis: Heparin  Advance Care  Planning:   Code Status: Full Code   Consults: Nephrology, cardiology  Family Communication: Significant other updated at bedside  Severity of Illness: The appropriate patient status for this patient is INPATIENT. Inpatient status is judged to be reasonable and necessary in order to provide the required intensity of service to ensure the patient's safety. The patient's presenting symptoms, physical exam findings, and initial radiographic and laboratory data in the context of their chronic comorbidities is felt to place them at high risk for further clinical deterioration. Furthermore, it is not anticipated that the patient will  be medically stable for discharge from the hospital within 2 midnights of admission.   * I certify that at the point of admission it is my clinical judgment that the patient will require inpatient hospital care spanning beyond 2 midnights from the point of admission due to high intensity of service, high risk for further deterioration and high frequency of surveillance required.*  Author: Maximino DELENA Sharps, MD 09/05/2024 7:12 AM  For on call review [email or web address].      [1]  Allergies Allergen Reactions   Lioresal  [Baclofen ] Other (See Comments)    Intolerable dizziness   Neurontin  [Gabapentin ] Other (See Comments)    Intolerable dizziness   Penicillins Other (See Comments)    Childhood allergy Unknown reaction    "

## 2024-09-05 NOTE — ED Notes (Signed)
 Cardiology consulted called to [facility] at [facility].

## 2024-09-06 ENCOUNTER — Encounter (HOSPITAL_COMMUNITY): Payer: Self-pay | Admitting: Family Medicine

## 2024-09-06 DIAGNOSIS — I214 Non-ST elevation (NSTEMI) myocardial infarction: Secondary | ICD-10-CM | POA: Diagnosis not present

## 2024-09-06 LAB — RENAL FUNCTION PANEL
Albumin: 3.2 g/dL — ABNORMAL LOW (ref 3.5–5.0)
Anion gap: 20 — ABNORMAL HIGH (ref 5–15)
BUN: 92 mg/dL — ABNORMAL HIGH (ref 6–20)
CO2: 12 mmol/L — ABNORMAL LOW (ref 22–32)
Calcium: 9.1 mg/dL (ref 8.9–10.3)
Chloride: 109 mmol/L (ref 98–111)
Creatinine, Ser: 11.7 mg/dL — ABNORMAL HIGH (ref 0.44–1.00)
GFR, Estimated: 4 mL/min — ABNORMAL LOW
Glucose, Bld: 120 mg/dL — ABNORMAL HIGH (ref 70–99)
Phosphorus: 7.7 mg/dL — ABNORMAL HIGH (ref 2.5–4.6)
Potassium: 4.7 mmol/L (ref 3.5–5.1)
Sodium: 141 mmol/L (ref 135–145)

## 2024-09-06 LAB — CBC
HCT: 30 % — ABNORMAL LOW (ref 36.0–46.0)
Hemoglobin: 9.3 g/dL — ABNORMAL LOW (ref 12.0–15.0)
MCH: 31.4 pg (ref 26.0–34.0)
MCHC: 31 g/dL (ref 30.0–36.0)
MCV: 101.4 fL — ABNORMAL HIGH (ref 80.0–100.0)
Platelets: 255 10*3/uL (ref 150–400)
RBC: 2.96 MIL/uL — ABNORMAL LOW (ref 3.87–5.11)
RDW: 13.9 % (ref 11.5–15.5)
WBC: 4.8 10*3/uL (ref 4.0–10.5)
nRBC: 0 % (ref 0.0–0.2)

## 2024-09-06 LAB — HEPARIN LEVEL (UNFRACTIONATED): Heparin Unfractionated: 0.54 [IU]/mL (ref 0.30–0.70)

## 2024-09-06 LAB — HEPATITIS B SURFACE ANTIGEN: Hepatitis B Surface Ag: NONREACTIVE

## 2024-09-06 LAB — PARATHYROID HORMONE, INTACT (NO CA): PTH: 357 pg/mL — ABNORMAL HIGH (ref 15–65)

## 2024-09-06 MED ORDER — VITAMIN D 25 MCG (1000 UNIT) PO TABS
2000.0000 [IU] | ORAL_TABLET | Freq: Every day | ORAL | Status: AC
  Administered 2024-09-06 – 2024-09-11 (×6): 2000 [IU] via ORAL
  Filled 2024-09-06 (×6): qty 2

## 2024-09-06 MED ORDER — HYDROCODONE-ACETAMINOPHEN 5-325 MG PO TABS: 1.0000 | ORAL_TABLET | ORAL | Status: AC | PRN

## 2024-09-06 MED ORDER — FAMOTIDINE 40 MG/5ML PO SUSR
20.0000 mg | Freq: Once | ORAL | Status: AC
  Administered 2024-09-06: 20 mg via ORAL
  Filled 2024-09-06: qty 2.5

## 2024-09-06 MED ORDER — ERGOCALCIFEROL 200 MCG/ML PO SOLN: 8000.0000 [IU] | Freq: Every day | ORAL | Status: DC

## 2024-09-06 MED ORDER — FERRIC CITRATE 1 GM 210 MG(FE) PO TABS
420.0000 mg | ORAL_TABLET | Freq: Three times a day (TID) | ORAL | Status: AC
  Administered 2024-09-06 – 2024-09-11 (×12): 420 mg via ORAL
  Filled 2024-09-06 (×12): qty 2

## 2024-09-06 MED ORDER — CHLORHEXIDINE GLUCONATE CLOTH 2 % EX PADS
6.0000 | MEDICATED_PAD | Freq: Every day | CUTANEOUS | Status: AC
  Administered 2024-09-07 – 2024-09-11 (×5): 6 via TOPICAL

## 2024-09-06 MED ORDER — CALCIUM CARBONATE ANTACID 500 MG PO CHEW
800.0000 mg | CHEWABLE_TABLET | Freq: Three times a day (TID) | ORAL | Status: AC
  Administered 2024-09-06 – 2024-09-11 (×14): 800 mg via ORAL
  Filled 2024-09-06 (×14): qty 4

## 2024-09-06 NOTE — Progress Notes (Signed)
 [facility] PROGRESS NOTE    Progress Note  Brooke Baldwin  FMW:[id number] DOB: July 28, 1976 DOA: 09/04/2024 PCP: Cleatus Donovan RAMAN, MD     Brief Narrative:   Brooke Baldwin is an 49 y.o. 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  In the ED blood pressure was 183/114, with a potassium of 5.7 CO2 of 14 creatinine of 12 cardiac biomarkers in the 1400s, chest x-ray showed small left-sided pleural effusion, he was given Lokelma  aspirin  and started on IV heparin  drip   Assessment/Plan:   Elevated troponin/atypical chest pain Twelve-lead EKG showed normal sinus rhythm no ST segment abnormalities, cardiac biomarkers troponins of 1500. Cardiology was consulted chest pain not suspected of angina. They recommended CRP and ESR, 2D echo showed an EF of 55% global hypokinesia no wall motion abnormality. He also recommended IV Lasix .  And continue conservative treatment  Hypertensive urgency: She says she ran out with blood pressure medications for about 3 weeks. She was started on Coreg  and amlodipine . Her blood pressures improved this morning. This could have been contributing to her chest pain.  Chronic kidney disease stage V/metabolic acidosis with increased anion gap and accumulation of organic acids: Been followed by [facility] for about 2 months she has been resistant to permanent accessed. Canceled several appointments for PD catheter. There is no current indication for dialysis they recommended Lokelma . Bicarb tablets. GFR is greater than 10 mL/min. She is currently being evaluated at [facility] for renal transplant but they are concerned because of her high viral load.  Hyperkalemia: Potassium 5.7 on admission she was given Lokelma , potassium this morning is  4.7.  Hyperphosphatemia: Start him on phosphate binder, as a phosphorus of 7.  Hypertensive urgency: Started on amlodipine  and IV hydralazine  as needed.  Anemia of chronic renal disease: Her hemoglobin this morning is 9.3 continue to monitor.  Vitamin D  deficiency: Started on replacement.  HIV (human immunodeficiency virus infection) (HCC) Resume her home medications, the patient refused switching to Dovato  she was continue Biktarvy  despite knowing the risk because of her renal dysfunction   DVT prophylaxis: lovenox Family Communication:none Status is: Inpatient Remains inpatient appropriate because: chest pain    Code Status:     Code Status Orders  (From admission, onward)           Start     Ordered   09/05/24 0728  Full code  Continuous       Question:  By:  Answer:  Consent: discussion documented in EHR   09/05/24 0730           Code Status History     Date Active Date Inactive Code Status Order ID Comments User Context   12/27/2021 2305 12/28/2021 2200 Full Code [id number]  Laurence Locus, DO Inpatient   10/21/2021 2105 10/25/2021 2344 Full Code [id number]  Lonzell Emeline HERO, DO ED   08/13/2021 0013 08/15/2021 0556 Full Code [id number]  Silvester Ales, MD ED   12/22/2020 0827 12/22/2020 1744 Full Code [id number]  Micheline Eleanor BIRCH, NP Inpatient   06/30/2020 1313 07/12/2020 2217 Full Code [id number]  Tobie Mario GAILS, MD ED         IV Access:   Peripheral IV   Procedures and diagnostic studies:   ECHOCARDIOGRAM COMPLETE Result Date: 09/05/2024    ECHOCARDIOGRAM REPORT   Patient Name:   Brooke  Marsa Baldwin Date of Exam: 09/05/2024 Medical Rec #:  [id number]            Height:       62.0 in Accession #:    [id number]           Weight:       171.5 lb Date of Birth:  1975-10-26           BSA:          1.791 m Patient Age:    48 years             BP:           145/93 mmHg Patient Gender: F                    HR:           95 bpm. Exam Location:  Inpatient Procedure: 2D  Echo, Cardiac Doppler and Color Doppler (Both Spectral and Color            Flow Doppler were utilized during procedure). Indications:    NSTEMI I21.4  History:        Patient has prior history of Echocardiogram examinations and                 Patient has no prior history of Echocardiogram examinations.  Sonographer:    Brooke Baldwin Referring Phys: [id number] Brooke Baldwin IMPRESSIONS  1. Left ventricular ejection fraction, by estimation, is 50 to 55%. The left ventricle has low normal function. The left ventricle demonstrates global hypokinesis. Left ventricular diastolic parameters were normal.  2. Right ventricular systolic function is normal. The right ventricular size is normal.  3. The mitral valve is normal in structure. Mild to moderate mitral valve regurgitation. No evidence of mitral stenosis.  4. The aortic valve is normal in structure. Aortic valve regurgitation is not visualized. No aortic stenosis is present.  5. The inferior vena cava is normal in size with greater than 50% respiratory variability, suggesting right atrial pressure of 3 mmHg. FINDINGS  Left Ventricle: Left ventricular ejection fraction, by estimation, is 50 to 55%. The left ventricle has low normal function. The left ventricle demonstrates global hypokinesis. The left ventricular internal cavity size was normal in size. There is no left ventricular hypertrophy. Left ventricular diastolic parameters were normal. Right Ventricle: The right ventricular size is normal. No increase in right ventricular wall thickness. Right ventricular systolic function is normal. Left Atrium: Left atrial size was normal in size. Right Atrium: Right atrial size was normal in size. Pericardium: There is no evidence of pericardial effusion. Mitral Valve: The mitral valve is normal in structure. Mild to moderate mitral valve regurgitation. No evidence of mitral valve stenosis. Tricuspid Valve: The tricuspid valve is normal in structure. Tricuspid valve  regurgitation is not demonstrated. No evidence of tricuspid stenosis. Aortic Valve: The aortic valve is normal in structure. Aortic valve regurgitation is not visualized. No aortic stenosis is present. Aortic valve mean gradient measures 5.0 mmHg. Aortic valve peak gradient measures 7.5 mmHg. Aortic valve area, by VTI measures 2.44 cm. Pulmonic Valve: The pulmonic valve was normal in structure. Pulmonic valve regurgitation is not visualized. No evidence of pulmonic stenosis. Aorta: The aortic root is normal in size and structure. Venous: The inferior vena cava is normal in size with greater than 50% respiratory variability, suggesting right atrial pressure of 3 mmHg. IAS/Shunts: No atrial level shunt detected by color flow Doppler.  LEFT VENTRICLE PLAX 2D LVIDd:  5.40 cm   Diastology LVIDs:         3.80 cm   LV e' medial:    8.05 cm/s LV PW:         0.80 cm   LV E/e' medial:  13.2 LV IVS:        0.80 cm   LV e' lateral:   9.57 cm/s LVOT diam:     2.00 cm   LV E/e' lateral: 11.1 LV SV:         65 LV SV Index:   36 LVOT Area:     3.14 cm  RIGHT VENTRICLE RV S prime:     12.80 cm/s TAPSE (M-mode): 2.2 cm LEFT ATRIUM             Index        RIGHT ATRIUM          Index LA Vol (A2C):   34.1 ml 19.04 ml/m  RA Area:     9.75 cm LA Vol (A4C):   33.7 ml 18.82 ml/m  RA Volume:   22.30 ml 12.45 ml/m LA Biplane Vol: 36.0 ml 20.10 ml/m  AORTIC VALVE AV Area (Vmax):    2.14 cm AV Area (Vmean):   2.08 cm AV Area (VTI):     2.44 cm AV Vmax:           137.00 cm/s AV Vmean:          103.000 cm/s AV VTI:            0.266 m AV Peak Grad:      7.5 mmHg AV Mean Grad:      5.0 mmHg LVOT Vmax:         93.40 cm/s LVOT Vmean:        68.100 cm/s LVOT VTI:          0.207 m LVOT/AV VTI ratio: 0.78  AORTA Ao Root diam: 2.90 cm MITRAL VALVE                TRICUSPID VALVE MV Area (PHT): 3.93 cm     TR Peak grad:   15.7 mmHg MV Decel Time: 193 msec     TR Vmax:        198.00 cm/s MV E velocity: 106.00 cm/s MV A velocity: 116.00  cm/s  SHUNTS MV E/A ratio:  0.91         Systemic VTI:  0.21 m                             Systemic Diam: 2.00 cm Brooke Tobb DO Electronically signed by Brooke Huntsman DO Signature Date/Time: 09/05/2024/2:17:38 PM    Final    DG Chest Port 1 View Result Date: 09/05/2024 EXAM: 1 VIEW(S) XRAY OF THE CHEST 09/04/2024 10:59:00 PM COMPARISON: 06/26/2024 CLINICAL HISTORY: Chest pain. FINDINGS: LUNGS AND PLEURA: No focal pulmonary opacity. Blunting of the left costophrenic angle. No pneumothorax. HEART AND MEDIASTINUM: No acute abnormality of the cardiac and mediastinal silhouettes. BONES AND SOFT TISSUES: 2.8 cm calcified density in left thoracic inlet may represent calcified lymph node or calcified thyroid  nodule. No acute osseous abnormality. IMPRESSION: 1. Blunting of the left costophrenic angle may represent small pleural effusion . 2. 2.8 cm calcified density in the left thoracic inlet, which may represent a calcified lymph node or calcified thyroid  nodule. Electronically signed by: Brooke Pique MD 09/05/2024 12:14 AM EST RP Workstation: [id number]  Medical Consultants:   None.   Subjective:    Brooke Baldwin V chest pain is resolved.  Objective:    Vitals:   09/05/24 2100 09/05/24 2142 09/05/24 2311 09/06/24 0514  BP: (!) 158/105  (!) 142/98 (!) 141/107  Pulse: 78  86 90  Resp: 16  16 18   Temp:  (!) 97.4 F (36.3 C)  97.6 F (36.4 C)  TempSrc:  Oral  Axillary  SpO2: 100%  100% 100%  Weight:    76.9 kg  Height:       SpO2: 100 % O2 Flow Rate (L/min): 0 L/min FiO2 (%): 21 %   Intake/Output Summary (Last 24 hours) at 09/06/2024 0703 Last data filed at 09/05/2024 2303 Gross per 24 hour  Intake 289.78 ml  Output 2550 ml  Net -2260.22 ml   Filed Weights   09/05/24 0302 09/06/24 0514  Weight: 77.8 kg 76.9 kg    Exam: General exam: In no acute distress. Respiratory system: Good air movement and clear to auscultation. Cardiovascular system: S1 & S2 heard, RRR. No  JVD. Gastrointestinal system: Abdomen is nondistended, soft and nontender.  Extremities: No pedal edema. Skin: No rashes, lesions or ulcers Psychiatry: Judgement and insight appear normal. Mood & affect appropriate.    Data Reviewed:    Labs: Basic Metabolic Panel: Recent Labs  Lab 09/04/24 2238 09/05/24 0921 09/05/24 0922 09/06/24 0227  NA 140  --  141 141  K 5.7*  --  4.9 4.7  CL 107  --  109 109  CO2 14*  --  12* 12*  GLUCOSE 86  --  74 120*  BUN 94*  --  93* 92*  CREATININE 11.90*  --  11.50* 11.70*  CALCIUM  9.4  --  9.4 9.1  PHOS  --  7.6*  --  7.7*   GFR Estimated Creatinine Clearance: 5.5 mL/min (A) (by C-G formula based on SCr of 11.7 mg/dL (H)). Liver Function Tests: Recent Labs  Lab 09/05/24 0921 09/06/24 0227  AST 21  --   ALT 12  --   ALKPHOS 112  --   BILITOT 0.4  --   PROT 7.0  --   ALBUMIN  3.5 3.2*   No results for input(s): LIPASE, AMYLASE in the last 168 hours. No results for input(s): AMMONIA in the last 168 hours. Coagulation profile No results for input(s): INR, PROTIME in the last 168 hours. COVID-19 Labs  Recent Labs    09/05/24 0921  CRP 4.9*    Lab Results  Component Value Date   SARSCOV2NAA NEGATIVE 08/12/2021   SARSCOV2NAA NEGATIVE 04/07/2021   SARSCOV2NAA NEGATIVE 11/01/2020   SARSCOV2NAA NEGATIVE 10/17/2020    CBC: Recent Labs  Lab 09/04/24 2238 09/05/24 0921 09/06/24 0227  WBC 6.7 6.3 4.8  NEUTROABS  --  4.1  --   HGB 8.6* 9.1* 9.3*  HCT 27.0* 29.2* 30.0*  MCV 97.8 100.0 101.4*  PLT 271 233 255   Cardiac Enzymes: No results for input(s): CKTOTAL, CKMB, CKMBINDEX, TROPONINI in the last 168 hours. BNP (last 3 results) Recent Labs    09/05/24 0048  PROBNP >35,000.0*   CBG: No results for input(s): GLUCAP in the last 168 hours. D-Dimer: No results for input(s): DDIMER in the last 72 hours. Hgb A1c: No results for input(s): HGBA1C in the last 72 hours. Lipid Profile: No results  for input(s): CHOL, HDL, LDLCALC, TRIG, CHOLHDL, LDLDIRECT in the last 72 hours. Thyroid  function studies: No results for input(s): TSH, T4TOTAL, T3FREE, THYROIDAB in the  last 72 hours.  Invalid input(s): FREET3 Anemia work up: No results for input(s): VITAMINB12, FOLATE, FERRITIN, TIBC, IRON , RETICCTPCT in the last 72 hours. Sepsis Labs: Recent Labs  Lab 09/04/24 2238 09/05/24 0921 09/06/24 0227  WBC 6.7 6.3 4.8   Microbiology No results found for this or any previous visit (from the past 240 hours).   Medications:    amLODipine   10 mg Oral Daily   bictegravir-emtricitabine -tenofovir  AF  1 tablet Oral Daily   carvedilol   3.125 mg Oral BID WC   fluticasone  furoate-vilanterol  1 puff Inhalation Daily   sodium bicarbonate   1,300 mg Oral TID   sodium chloride  flush  3 mL Intravenous Q12H   Continuous Infusions:  heparin  1,000 Units/hr (09/05/24 2303)      LOS: 1 day   Erle Odell Castor  [facility]  09/06/2024, 7:03 AM

## 2024-09-06 NOTE — Progress Notes (Signed)
 "  Progress Note  Patient Name: Brooke Baldwin Date of Encounter: 09/06/2024  Primary Cardiologist: None   Subjective   Patient seen and examined at her bedside. She reports that the chest pain have improved greatly.  Inpatient Medications    Scheduled Meds:  amLODipine   10 mg Oral Daily   bictegravir-emtricitabine -tenofovir  AF  1 tablet Oral Daily   calcium  carbonate  800 mg of elemental calcium  Oral TID WC   carvedilol   3.125 mg Oral BID WC   [START ON 09/07/2024] Chlorhexidine  Gluconate Cloth  6 each Topical Q0600   cholecalciferol   2,000 Units Oral Daily   fluticasone  furoate-vilanterol  1 puff Inhalation Daily   sodium bicarbonate   1,300 mg Oral TID   sodium chloride  flush  3 mL Intravenous Q12H   Continuous Infusions:  heparin  950 Units/hr (09/06/24 1115)   PRN Meds: acetaminophen , albuterol , hydrALAZINE , morphine  injection, nitroGLYCERIN , ondansetron  (ZOFRAN ) IV   Vital Signs    Vitals:   09/05/24 2311 09/06/24 0514 09/06/24 0832 09/06/24 1043  BP: (!) 142/98 (!) 141/107 (!) 157/96   Pulse: 86 90 91   Resp: 16 18 20    Temp:  97.6 F (36.4 C) (!) 97.5 F (36.4 C)   TempSrc:  Axillary Oral   SpO2: 100% 100% 100% 100%  Weight:  76.9 kg    Height:        Intake/Output Summary (Last 24 hours) at 09/06/2024 1202 Last data filed at 09/06/2024 9170 Gross per 24 hour  Intake 529.78 ml  Output 2150 ml  Net -1620.22 ml   Filed Weights   09/05/24 0302 09/06/24 0514  Weight: 77.8 kg 76.9 kg    Telemetry    Sinus rhythm - Personally Reviewed  ECG     - Personally Reviewed  Physical Exam    General: Comfortable Head: Atraumatic, normal size  Eyes: PEERLA, EOMI  Neck: Supple, normal JVD Cardiac: Normal S1, S2; RRR; no murmurs, rubs, or gallops Lungs: Clear to auscultation bilaterally Abd: Soft, nontender, no hepatomegaly  Ext: warm, no edema Musculoskeletal: No deformities, BUE and BLE strength normal and equal Skin: Warm and dry, no rashes   Neuro:  Alert and oriented to person, place, time, and situation, CNII-XII grossly intact, no focal deficits  Psych: Normal mood and affect   Labs    Chemistry Recent Labs  Lab 09/04/24 2238 09/05/24 0921 09/05/24 0922 09/06/24 0227  NA 140  --  141 141  K 5.7*  --  4.9 4.7  CL 107  --  109 109  CO2 14*  --  12* 12*  GLUCOSE 86  --  74 120*  BUN 94*  --  93* 92*  CREATININE 11.90*  --  11.50* 11.70*  CALCIUM  9.4  --  9.4 9.1  PROT  --  7.0  --   --   ALBUMIN   --  3.5  --  3.2*  AST  --  21  --   --   ALT  --  12  --   --   ALKPHOS  --  112  --   --   BILITOT  --  0.4  --   --   GFRNONAA 3*  --  4* 4*  ANIONGAP 20*  --  20* 20*     Hematology Recent Labs  Lab 09/04/24 2238 09/05/24 0921 09/06/24 0227  WBC 6.7 6.3 4.8  RBC 2.76* 2.92* 2.96*  HGB 8.6* 9.1* 9.3*  HCT 27.0* 29.2* 30.0*  MCV 97.8 100.0 101.4*  MCH 31.2 31.2 31.4  MCHC 31.9 31.2 31.0  RDW 14.0 13.9 13.9  PLT 271 233 255    Cardiac EnzymesNo results for input(s): TROPONINI in the last 168 hours. No results for input(s): TROPIPOC in the last 168 hours.   BNP Recent Labs  Lab 09/05/24 0048  PROBNP >35,000.0*     DDimer No results for input(s): DDIMER in the last 168 hours.   Radiology    ECHOCARDIOGRAM COMPLETE Result Date: 09/05/2024    ECHOCARDIOGRAM REPORT   Patient Name:   Brooke Baldwin Date of Exam: 09/05/2024 Medical Rec #:  [id number]            Height:       62.0 in Accession #:    [id number]           Weight:       171.5 lb Date of Birth:  May 27, 1976           BSA:          1.791 m Patient Age:    48 years             BP:           145/93 mmHg Patient Gender: F                    HR:           95 bpm. Exam Location:  Inpatient Procedure: 2D Echo, Cardiac Doppler and Color Doppler (Both Spectral and Color            Flow Doppler were utilized during procedure). Indications:    NSTEMI I21.4  History:        Patient has prior history of Echocardiogram examinations and                  Patient has no prior history of Echocardiogram examinations.  Sonographer:    Jayson Gaskins Referring Phys: [id number] SUBRINA SUNDIL IMPRESSIONS  1. Left ventricular ejection fraction, by estimation, is 50 to 55%. The left ventricle has low normal function. The left ventricle demonstrates global hypokinesis. Left ventricular diastolic parameters were normal.  2. Right ventricular systolic function is normal. The right ventricular size is normal.  3. The mitral valve is normal in structure. Mild to moderate mitral valve regurgitation. No evidence of mitral stenosis.  4. The aortic valve is normal in structure. Aortic valve regurgitation is not visualized. No aortic stenosis is present.  5. The inferior vena cava is normal in size with greater than 50% respiratory variability, suggesting right atrial pressure of 3 mmHg. FINDINGS  Left Ventricle: Left ventricular ejection fraction, by estimation, is 50 to 55%. The left ventricle has low normal function. The left ventricle demonstrates global hypokinesis. The left ventricular internal cavity size was normal in size. There is no left ventricular hypertrophy. Left ventricular diastolic parameters were normal. Right Ventricle: The right ventricular size is normal. No increase in right ventricular wall thickness. Right ventricular systolic function is normal. Left Atrium: Left atrial size was normal in size. Right Atrium: Right atrial size was normal in size. Pericardium: There is no evidence of pericardial effusion. Mitral Valve: The mitral valve is normal in structure. Mild to moderate mitral valve regurgitation. No evidence of mitral valve stenosis. Tricuspid Valve: The tricuspid valve is normal in structure. Tricuspid valve regurgitation is not demonstrated. No evidence of tricuspid stenosis. Aortic Valve: The aortic valve is normal in structure. Aortic valve regurgitation is not visualized. No aortic stenosis  is present. Aortic valve mean gradient measures 5.0 mmHg.  Aortic valve peak gradient measures 7.5 mmHg. Aortic valve area, by VTI measures 2.44 cm. Pulmonic Valve: The pulmonic valve was normal in structure. Pulmonic valve regurgitation is not visualized. No evidence of pulmonic stenosis. Aorta: The aortic root is normal in size and structure. Venous: The inferior vena cava is normal in size with greater than 50% respiratory variability, suggesting right atrial pressure of 3 mmHg. IAS/Shunts: No atrial level shunt detected by color flow Doppler.  LEFT VENTRICLE PLAX 2D LVIDd:         5.40 cm   Diastology LVIDs:         3.80 cm   LV e' medial:    8.05 cm/s LV PW:         0.80 cm   LV E/e' medial:  13.2 LV IVS:        0.80 cm   LV e' lateral:   9.57 cm/s LVOT diam:     2.00 cm   LV E/e' lateral: 11.1 LV SV:         65 LV SV Index:   36 LVOT Area:     3.14 cm  RIGHT VENTRICLE RV S prime:     12.80 cm/s TAPSE (M-mode): 2.2 cm LEFT ATRIUM             Index        RIGHT ATRIUM          Index LA Vol (A2C):   34.1 ml 19.04 ml/m  RA Area:     9.75 cm LA Vol (A4C):   33.7 ml 18.82 ml/m  RA Volume:   22.30 ml 12.45 ml/m LA Biplane Vol: 36.0 ml 20.10 ml/m  AORTIC VALVE AV Area (Vmax):    2.14 cm AV Area (Vmean):   2.08 cm AV Area (VTI):     2.44 cm AV Vmax:           137.00 cm/s AV Vmean:          103.000 cm/s AV VTI:            0.266 m AV Peak Grad:      7.5 mmHg AV Mean Grad:      5.0 mmHg LVOT Vmax:         93.40 cm/s LVOT Vmean:        68.100 cm/s LVOT VTI:          0.207 m LVOT/AV VTI ratio: 0.78  AORTA Ao Root diam: 2.90 cm MITRAL VALVE                TRICUSPID VALVE MV Area (PHT): 3.93 cm     TR Peak grad:   15.7 mmHg MV Decel Time: 193 msec     TR Vmax:        198.00 cm/s MV E velocity: 106.00 cm/s MV A velocity: 116.00 cm/s  SHUNTS MV E/A ratio:  0.91         Systemic VTI:  0.21 m                             Systemic Diam: 2.00 cm Dub Siyona Coto DO Electronically signed by Dub Huntsman DO Signature Date/Time: 09/05/2024/2:17:38 PM    Final    DG Chest Port 1  View Result Date: 09/05/2024 EXAM: 1 VIEW(S) XRAY OF THE CHEST 09/04/2024 10:59:00 PM COMPARISON: 06/26/2024 CLINICAL HISTORY: Chest pain. FINDINGS: LUNGS AND PLEURA:  No focal pulmonary opacity. Blunting of the left costophrenic angle. No pneumothorax. HEART AND MEDIASTINUM: No acute abnormality of the cardiac and mediastinal silhouettes. BONES AND SOFT TISSUES: 2.8 cm calcified density in left thoracic inlet may represent calcified lymph node or calcified thyroid  nodule. No acute osseous abnormality. IMPRESSION: 1. Blunting of the left costophrenic angle may represent small pleural effusion . 2. 2.8 cm calcified density in the left thoracic inlet, which may represent a calcified lymph node or calcified thyroid  nodule. Electronically signed by: Greig Pique MD 09/05/2024 12:14 AM EST RP Workstation: [id number]    Cardiac Studies   Echocardiogram  Patient Profile     49 y.o. female with atypical chest pain   Assessment & Plan    Type II MI, demand ischemia and elevated troponin contributing from her renal failyre Chest discomfort with shortness of breath Acute diastolic heart failure likely in the setting of her renal failure Hypertensive heart disease/hypertensive urgency Chronic kidney disease stage V, creatinine 11.9 HIV   She reports that symptoms have improved significantly. Noted previous symptoms more consistent with pleurisy. CRP and ESR elevate with no evidence of pericardia effusion on echo and no wall motion abnormalities. Therefore doubt ACS no indication for ischemic evaluation at this time.   It is great that her symptoms have improved because we will monitor closely for now. Given her renal disease would prefer to avoid NSAIDs and Colchicine if at all possible.   Will stop the heparin  gtt today. As noted prior elevate troponin with no significant delta and symptoms not consistent with ACS.   She is in heart failure clinically was on Lasix , appears from nephrology recent note  she is willing to work with them for dialysis.      For questions or updates, please contact [facility] Please consult [email or web address] for contact info under Cardiology/STEMI.      Signed, Luay Balding, DO  09/06/2024, 12:02 PM    "

## 2024-09-06 NOTE — Progress Notes (Signed)
 Patient states she is have central chest discomfort that is worse with deep breathing.  Patient states it has been going on since around 3.  Patient educated patient importance of calling RN if chest pain occurs.  Dr Celinda paged, he states this pain is pleuritic, states not to obtain EKG at this time.   BP=158/97 HR 83.  Dr. Celinda notified, he states this pain is pleuritic and there is no need for EKG at this time.  He placed order for vicodin.  Medication offered to patient, she refused at this time.

## 2024-09-06 NOTE — Plan of Care (Signed)
   Problem: Education: Goal: Knowledge of General Education information will improve Description Including pain rating scale, medication(s)/side effects and non-pharmacologic comfort measures Outcome: Progressing

## 2024-09-06 NOTE — Consult Note (Cosign Needed Addendum)
 "     Patient Status: [facility] - In-pt  Assessment and Plan: Chronic kidney disease Patient in need of venous access to begin HD.  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 Has had prior right-sided catheters, but none since 2022/2023 per her report. WBC within normal limits and no pending blood cultures.  N.p.o. since midnight.  On heparin  drip which will be paused for tunneled HD catheter placement once timing is known of procedure.  NPO p MN.  Will need to order Vanc in IR due to PCN allergy.   FULL CODE.   Risks and benefits discussed with the patient including, but not limited to bleeding, infection, vascular injury, pneumothorax which may require chest tube placement, air embolism or even death  All of the patient's questions were answered, patient is agreeable to proceed. Consent signed and in chart.  ______________________________________________________________________   History of Present Illness: Brooke Baldwin is a 49 y.o. female with past medical history significant for FSGS CKD 5, trigeminal neuralgia, uterine fibroids, HTN.  Patient previously started hemodialysis in 2021 but was frequently noncompliant, sometimes missing greater than 1 month of treatment she has expressed interest in PD but PD cath placement was not possible due to large pelvic mass.  AVG patient ended with failed access, and patient was not amenable to additional attempts.  Chart reflects that she last underwent dialysis 2023 via [facility] placed by outside service.  See was removed March for infection, she ended up leaving the hospital prior to new access and without dialysis.  She is known from random renal biopsy 04/13/2020 by Dr. Karalee.  She is currently hospitalized for NSTEMI and is now willing to initiate dialysis again, prompting request for [facility] by  IR.  Allergies and medications reviewed.   Review of Systems: A 12 point ROS discussed and pertinent positives are indicated in the HPI above.  All other systems are negative.  Review of Systems  Constitutional:  Positive for fatigue. Negative for fever.  Respiratory:  Positive for shortness of breath. Negative for cough.   Cardiovascular:  Negative for chest pain.  Gastrointestinal:  Negative for abdominal pain.  Musculoskeletal:  Negative for back pain.  Psychiatric/Behavioral:  Negative for behavioral problems and confusion.     Vital Signs: BP (!) 144/97 (BP Location: Right Arm)   Pulse 89   Temp (!) 97.5 F (36.4 C) (Oral)   Resp 16   Ht 5' 2 (1.575 m)   Wt 169 lb 8 oz (76.9 kg)   LMP 04/02/2015   SpO2 100%   BMI 31.00 kg/m   Physical Exam Vitals and nursing note reviewed.  Constitutional:      General: She is not in acute distress.    Appearance: She is well-developed. She is not ill-appearing.  Cardiovascular:     Rate and Rhythm: Normal rate and regular rhythm.  Skin:    General: Skin is warm and dry.  Neurological:     General: No focal deficit present.     Mental Status: She is alert and oriented to person, place, and time.  Psychiatric:        Mood and Affect: Mood normal.        Behavior: Behavior normal.      Imaging reviewed.   Labs:  COAGS: No results for input(s): INR, APTT in the last 8760 hours.  BMP: Recent  Labs    11/12/23 1007 09/04/24 2238 09/05/24 0922 09/06/24 0227  NA 143 140 141 141  K 4.8 5.7* 4.9 4.7  CL 116* 107 109 109  CO2 14* 14* 12* 12*  GLUCOSE 70 86 74 120*  BUN 82* 94* 93* 92*  CALCIUM  9.4 9.4 9.4 9.1  CREATININE 8.68* 11.90* 11.50* 11.70*  GFRNONAA  --  3* 4* 4*     Solmon Ku, MS RD PA-C   I spent a total of 20 minutes in face to face in clinical consultation, greater than 50% of which was counseling/coordinating care for renal dysfunction.    "

## 2024-09-06 NOTE — Progress Notes (Signed)
 Request received for tunneled HD cath to initiate while IP. No obvious contraindications with chart review : almost certainly long term need, WBC WNL, no pending BC, short acting AC that will be held once timing known (hep gtt). Patient made NPO at MN in preparation for likely procedure. Will undergo final approval in AM by attending on site who will be performing procedure.   Ailine Hefferan NP 09/06/2024 2:25 PM

## 2024-09-06 NOTE — Progress Notes (Signed)
 Brooke Baldwin is an 49 y.o. female PMH HIV, FSGS, trigeminal neuralgia, uterine fibroids, hypertension, and medical non compliance.  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 HD in December of 2021 but was non compliant with dialysis, sometimes missing >1 month of treatment. Ultimately had wanted to do PD, but when she went to have PD cath placed in Sept 2022 was noted to have large pelvic mass and surgery was aborted (uterine fibroid).    Her TDC was removed in March 2023 due to possible infection and she left the hospital prior to having a new one placed had been without dialysis since that time.   07/14/24 vitamin D  is 8.8, creatinine 12.9, potassium 5.3, hemoglobin 10.1, adequate iron  stores, PTH 1028. Out of antihypertensives and never called the clinic for another rx to be called in; [place] is in [place] and she keeps traveling back for that. Also has stress from her job with [employer] now with SOB, CP.   Assessment/Plan: CKD5 - She has been followed at [facility] q6-8weeks for many years, resistant to permanent access after  failed attempt x1 (lt FAL AVG) despite multiple attempts to encourage another attempt. She is interested in PD but has canceled appts many times because her mother not being able to come down to help with transport etc. Finally was willing to see [facility] again for permanent access but still has not see them. - She has been compliant with the HCO3 at home and yet running 12-14. - She needs to initiate dialysis, finally willing to; requested TC by [facility]. Eventually wants to do PD; reached out to [facility], assume they've evaluated as she was referred a while ago to Dr. Vonzella. - incr bicarb to 650mg  2 tabs TID for now but will stop once she's on HD. - HD tomorrow after TC; needs CLIP as well   - Dose meds for GFR < 32ml/min -Monitor Daily I/Os, Daily weight  -Maintain MAP>65 for optimal renal perfusion.  -  Avoid nephrotoxic agents such as IV contrast, NSAIDs, and phosphate containing bowel preps (FLEETS)   Pre-emptive renal transplant - She is currently being worked up at [facility] but they have had concerns with [place] and prior high HIV viral loads. I've actually forwarded results of undetectable viral loads in recent times to [facility]. She also needs a colonoscopy but has an outstanding balance at the [facility] (has not called to resolve and get the necessary colonoscopy). Chest pain with elevated troponin on heparin  gtt, nitropaste with improvement in CP. Wonder if demand ischemia was playing a role; out of BP meds for a few days/week (see below) Hypertension - restart amlodipine , historically on higher side of normal range. Patient had been out of antihypertensives and never called the clinic for refill. Renal osteodystrophy - supposed to be on Calcitriol with episodes of [place]; her insurance did not cover fosrenol and should be on Auryxia  2/1. Phos 7.7. Anemia - Hb 9.3. Start ESA but will also check iron  panels. H/o large pedunculated peripherally calcified fibroid measuring at least 9.1 x 6.4 x 14.5 cm.s/p hysterectomy/salpingectomy + vulvar excision on 10/15/22 in [place]. HIV now back on meds   Subjective: Denies further chest pain, nausea, vomiting, fever, chills. Appetitie has not been good.   Chemistry and CBC: Creat  Date/Time Value Ref Range Status  11/12/2023 10:07 AM 8.68 (H) 0.50 - 0.99 mg/dL Final    Comment:  Verified by repeat analysis. [name]   04/12/2022 09:39 AM 5.29 (H) 0.50 - 0.99 mg/dL Final  97/77/7976 90:69 AM 6.10 (H) 0.50 - 0.99 mg/dL Final  90/70/7977 89:98 AM 7.36 (H) 0.50 - 0.99 mg/dL Final  98/85/7977 87:99 AM 4.26 (H) 0.50 - 1.10 mg/dL Final    Comment:    Verified by repeat analysis. .    Creatinine, Ser  Date/Time Value Ref Range Status  09/06/2024 02:27 AM 11.70 (H) 0.44 - 1.00 mg/dL Final  98/68/7973 90:77 AM 11.50 (H) 0.44 - 1.00 mg/dL Final  98/69/7973 89:61 PM 11.90 (H)  0.44 - 1.00 mg/dL Final  98/82/7975 93:49 PM 5.76 (H) 0.44 - 1.00 mg/dL Final  88/89/7976 97:90 PM 5.76 (H) 0.44 - 1.00 mg/dL Final  94/74/7976 98:97 AM 5.61 (H) 0.44 - 1.00 mg/dL Final  94/75/7976 97:82 PM 5.99 (H) 0.44 - 1.00 mg/dL Final  96/77/7976 94:91 AM 6.59 (H) 0.44 - 1.00 mg/dL Final  96/78/7976 87:99 PM 6.44 (H) 0.44 - 1.00 mg/dL Final  96/81/7976 88:50 PM 6.46 (H) 0.44 - 1.00 mg/dL Final  96/81/7976 93:76 PM 6.62 (H) 0.44 - 1.00 mg/dL Final  98/90/7976 94:50 AM 6.82 (H) 0.44 - 1.00 mg/dL Final  98/91/7976 94:99 AM 6.03 (H) 0.44 - 1.00 mg/dL Final  98/92/7976 91:67 PM 6.48 (H) 0.44 - 1.00 mg/dL Final  98/92/7976 98:49 PM 7.28 (H) 0.44 - 1.00 mg/dL Final  98/92/7976 87:88 PM 7.80 (H) 0.44 - 1.00 mg/dL Final  98/92/7976 88:85 AM 7.28 (H) 0.44 - 1.00 mg/dL Final  90/97/7977 90:83 PM 7.26 (H) 0.44 - 1.00 mg/dL Final  94/80/7977 90:88 AM 10.60 (H) 0.44 - 1.00 mg/dL Final  87/76/7978 90:83 AM 7.20 (H) 0.44 - 1.00 mg/dL Final  87/81/7978 93:92 PM 4.48 (H) 0.44 - 1.00 mg/dL Final  87/92/7978 96:45 AM 11.38 (H) 0.44 - 1.00 mg/dL Final  87/93/7978 94:92 AM 10.16 (H) 0.44 - 1.00 mg/dL Final  87/94/7978 98:51 AM 13.10 (H) 0.44 - 1.00 mg/dL Final  87/95/7978 96:82 AM 12.37 (H) 0.44 - 1.00 mg/dL Final  87/96/7978 96:78 AM 12.32 (H) 0.44 - 1.00 mg/dL Final  87/97/7978 95:60 AM 11.80 (H) 0.44 - 1.00 mg/dL Final  87/98/7978 87:59 AM 10.99 (H) 0.44 - 1.00 mg/dL Final  88/69/7978 91:44 AM 10.48 (H) 0.44 - 1.00 mg/dL Final  88/69/7978 95:73 AM 10.51 (H) 0.44 - 1.00 mg/dL Final  88/70/7978 97:66 AM 9.82 (H) 0.44 - 1.00 mg/dL Final  88/71/7978 95:97 AM 9.16 (H) 0.44 - 1.00 mg/dL Final  88/72/7978 96:93 AM 9.42 (H) 0.44 - 1.00 mg/dL Final  88/73/7978 96:88 PM 9.44 (H) 0.44 - 1.00 mg/dL Final  88/73/7978 91:91 AM 9.24 (H) 0.44 - 1.00 mg/dL Final  88/74/7978 88:66 AM 10.02 (H) 0.44 - 1.00 mg/dL Final  94/88/7978 91:51 AM 2.06 (H) 0.57 - 1.00 mg/dL Final  94/94/7978 90:45 AM 1.85 (H) 0.57 - 1.00  mg/dL Final   Recent Labs  Lab 09/04/24 2238 09/05/24 0921 09/05/24 0922 09/06/24 0227  NA 140  --  141 141  K 5.7*  --  4.9 4.7  CL 107  --  109 109  CO2 14*  --  12* 12*  GLUCOSE 86  --  74 120*  BUN 94*  --  93* 92*  CREATININE 11.90*  --  11.50* 11.70*  CALCIUM  9.4  --  9.4 9.1  PHOS  --  7.6*  --  7.7*   Recent Labs  Lab 09/04/24 2238 09/05/24 0921 09/06/24 0227  WBC 6.7 6.3 4.8  NEUTROABS  --  4.1  --   HGB 8.6* 9.1* 9.3*  HCT 27.0* 29.2* 30.0*  MCV 97.8 100.0 101.4*  PLT 271 233 255   Liver Function Tests: Recent Labs  Lab 09/05/24 0921 09/06/24 0227  AST 21  --   ALT 12  --   ALKPHOS 112  --   BILITOT 0.4  --   PROT 7.0  --   ALBUMIN  3.5 3.2*   No results for input(s): LIPASE, AMYLASE in the last 168 hours. No results for input(s): AMMONIA in the last 168 hours. Cardiac Enzymes: No results for input(s): CKTOTAL, CKMB, CKMBINDEX, TROPONINI in the last 168 hours. Iron  Studies: No results for input(s): IRON , TIBC, TRANSFERRIN, FERRITIN in the last 72 hours. PT/INR: @LABRCNTIP (inr:5)  Xrays/Other Studies: ) Results for orders placed or performed during the hospital encounter of 09/04/24 (from the past 48 hours)  Basic metabolic panel     Status: Abnormal   Collection Time: 09/04/24 10:38 PM  Result Value Ref Range   Sodium 140 135 - 145 mmol/L   Potassium 5.7 (H) 3.5 - 5.1 mmol/L   Chloride 107 98 - 111 mmol/L   CO2 14 (L) 22 - 32 mmol/L   Glucose, Bld 86 70 - 99 mg/dL    Comment: Glucose reference range applies only to samples taken after fasting for at least 8 hours.   BUN 94 (H) 6 - 20 mg/dL   Creatinine, Ser 88.09 (H) 0.44 - 1.00 mg/dL   Calcium  9.4 8.9 - 10.3 mg/dL   GFR, Estimated 3 (L) >60 mL/min    Comment: (NOTE) Calculated using the CKD-EPI Creatinine Equation (2021)    Anion gap 20 (H) 5 - 15    Comment: Performed at [facility], [address], [place], [place] [address]  CBC     Status:  Abnormal   Collection Time: 09/04/24 10:38 PM  Result Value Ref Range   WBC 6.7 4.0 - 10.5 K/uL   RBC 2.76 (L) 3.87 - 5.11 MIL/uL   Hemoglobin 8.6 (L) 12.0 - 15.0 g/dL   HCT 72.9 (L) 63.9 - 53.9 %   MCV 97.8 80.0 - 100.0 fL   MCH 31.2 26.0 - 34.0 pg   MCHC 31.9 30.0 - 36.0 g/dL   RDW 85.9 88.4 - 84.4 %   Platelets 271 150 - 400 K/uL   nRBC 0.0 0.0 - 0.2 %    Comment: Performed at [facility], [address], [place], [place] [address]  Troponin T, High Sensitivity     Status: Abnormal   Collection Time: 09/04/24 10:38 PM  Result Value Ref Range   Troponin T High Sensitivity 1,367 (HH) 0 - 19 ng/L    Comment: Critical Value, Read Back and verified with Delanna Polite RN,09/04/2024 @ 2358 by ADELBERT (NOTE) Biotin concentrations > 1000 ng/mL falsely decrease TnT results.  Serial cardiac troponin measurements are suggested.  Refer to the Links section for chest pain algorithms and additional  guidance. Performed at [facility], [address], [place], [place] [address]   Troponin T, High Sensitivity     Status: Abnormal   Collection Time: 09/05/24 12:38 AM  Result Value Ref Range   Troponin T High Sensitivity 1,408 (HH) 0 - 19 ng/L    Comment: Critical Value, Read Back and verified with CAMERON C., RN [phone] AD (NOTE) Biotin concentrations > 1000 ng/mL falsely decrease TnT results.  Serial cardiac troponin measurements are suggested.  Refer to the Links section for chest pain algorithms and  additional  guidance. Performed at [facility], [address] [place], [place] [address]   Pro Brain natriuretic peptide     Status: Abnormal   Collection Time: 09/05/24 12:48 AM  Result Value Ref Range   Pro Brain Natriuretic Peptide >35,000.0 (H) <300.0 pg/mL    Comment: (NOTE) Age Group        Cut-Points    Interpretation  < 50 years     450 pg/mL       NT-proBNP > 450 pg/mL indicates                                ADHF is  likely              50 to 75 years  900 pg/mL      NT-proBNP > 900 pg/mL indicates          ADHF is likely  > 75 years      1800 pg/mL     NT-proBNP > 1800 pg/mL indicates          ADHF is likely                           All ages    Results between       Indeterminate. Further clinical             300 and the cut-   information is needed to determine            point for age group   if ADHF is present.                                                             Elecsys proBNP II/ Elecsys proBNP II STAT           Cut-Point                       Interpretation  300 pg/mL                    NT-proBNP <300pg/mL indicates                             ADHF is not likely  Performed at [facility], [address] [place], [place] [address]   Type and screen [facility]     Status: None   Collection Time: 09/05/24  9:19 AM  Result Value Ref Range   ABO/RH(D) O POS    Antibody Screen NEG    Sample Expiration      09/08/2024,2359 Performed at [facility], [address] [place], [place] [address]   Troponin T, High Sensitivity     Status: Abnormal   Collection Time: 09/05/24  9:21 AM  Result Value Ref Range   Troponin T High Sensitivity 1,585 (HH) 0 - 19 ng/L    Comment: Critical Value, Read Back and verified with J.CHARLTON RN @1114  [phone] M.WALTERS  (NOTE) Biotin concentrations > 1000 ng/mL falsely decrease TnT results.  Serial cardiac troponin measurements are suggested.  Refer to the Links section for chest pain algorithms and  additional  guidance. Performed at [facility], [address] [place], [place] [address]   Hepatic function panel     Status: None   Collection Time: 09/05/24  9:21 AM  Result Value Ref Range   Total Protein 7.0 6.5 - 8.1 g/dL   Albumin  3.5 3.5 - 5.0 g/dL   AST 21 15 - 41 U/L   ALT 12 0 - 44 U/L   Alkaline Phosphatase 112 38 - 126 U/L   Total Bilirubin 0.4 0.0 - 1.2 mg/dL   Bilirubin, Direct  <9.8 0.0 - 0.2 mg/dL   Indirect Bilirubin NOT CALCULATED 0.3 - 0.9 mg/dL    Comment: Performed at [facility], [address], [place], [place] [address]  CBC with Differential/Platelet     Status: Abnormal   Collection Time: 09/05/24  9:21 AM  Result Value Ref Range   WBC 6.3 4.0 - 10.5 K/uL   RBC 2.92 (L) 3.87 - 5.11 MIL/uL   Hemoglobin 9.1 (L) 12.0 - 15.0 g/dL   HCT 70.7 (L) 63.9 - 53.9 %   MCV 100.0 80.0 - 100.0 fL   MCH 31.2 26.0 - 34.0 pg   MCHC 31.2 30.0 - 36.0 g/dL   RDW 86.0 88.4 - 84.4 %   Platelets 233 150 - 400 K/uL   nRBC 0.0 0.0 - 0.2 %   Neutrophils Relative % 64 %   Neutro Abs 4.1 1.7 - 7.7 K/uL   Lymphocytes Relative 15 %   Lymphs Abs 0.9 0.7 - 4.0 K/uL   Monocytes Relative 10 %   Monocytes Absolute 0.6 0.1 - 1.0 K/uL   Eosinophils Relative 10 %   Eosinophils Absolute 0.6 (H) 0.0 - 0.5 K/uL   Basophils Relative 1 %   Basophils Absolute 0.0 0.0 - 0.1 K/uL   Immature Granulocytes 0 %   Abs Immature Granulocytes 0.02 0.00 - 0.07 K/uL    Comment: Performed at [facility], [address], [place], [place] [address]  Phosphorus     Status: Abnormal   Collection Time: 09/05/24  9:21 AM  Result Value Ref Range   Phosphorus 7.6 (H) 2.5 - 4.6 mg/dL    Comment: Performed at [facility], [address] [place], [place] [address]  Parathyroid  hormone, intact (no Ca)     Status: Abnormal   Collection Time: 09/05/24  9:21 AM  Result Value Ref Range   PTH 357 (H) 15 - 65 pg/mL    Comment: (NOTE) Performed At: [facility] [address] [place], [place] [id number] Jennette Shorter MD Ey:[id number]   VITAMIN D  25 Hydroxy (Vit-D Deficiency, Fractures)     Status: Abnormal   Collection Time: 09/05/24  9:21 AM  Result Value Ref Range   Vit D, 25-Hydroxy 12.5 (L) 30 - 100 ng/mL    Comment: (NOTE) Vitamin D  deficiency has been defined by the Institute of Medicine  and an Endocrine Society practice guideline as a level of serum 25-OH  vitamin D   less than 20 ng/mL (1,2). The Endocrine Society went on to  further define vitamin D  insufficiency as a level between 21 and 29  ng/mL (2).  1. IOM (Institute of Medicine). 2010. Dietary reference intakes for  calcium  and D. [place]: [employer]. 2. Holick MF, [place], Bischoff-Ferrari HA, et al. Evaluation,  treatment, and prevention of vitamin D  deficiency: an Endocrine  Society clinical practice guideline, JCEM. 2011 Jul; 96(7): 1911-30.  Performed at [facility], [address], [place], [place] [address]   C-reactive protein     Status: Abnormal   Collection Time: 09/05/24  9:21 AM  Result Value Ref Range   CRP 4.9 (H) <1.0 mg/dL    Comment: Performed at [facility], [address] [place], [place] [address]  Sedimentation rate     Status: Abnormal   Collection Time: 09/05/24  9:21 AM  Result Value Ref Range   Sed Rate 100 (H) 0 - 22 mm/hr    Comment: Performed at [facility], [address], [place], [place] [address]  Basic metabolic panel     Status: Abnormal   Collection Time: 09/05/24  9:22 AM  Result Value Ref Range   Sodium 141 135 - 145 mmol/L   Potassium 4.9 3.5 - 5.1 mmol/L   Chloride 109 98 - 111 mmol/L   CO2 12 (L) 22 - 32 mmol/L   Glucose, Bld 74 70 - 99 mg/dL    Comment: Glucose reference range applies only to samples taken after fasting for at least 8 hours.   BUN 93 (H) 6 - 20 mg/dL   Creatinine, Ser 88.49 (H) 0.44 - 1.00 mg/dL   Calcium  9.4 8.9 - 10.3 mg/dL   GFR, Estimated 4 (L) >60 mL/min    Comment: (NOTE) Calculated using the CKD-EPI Creatinine Equation (2021)    Anion gap 20 (H) 5 - 15    Comment: Performed at [facility], [address], [place], [place] [address]  ABO/Rh     Status: None   Collection Time: 09/05/24 12:44 PM  Result Value Ref Range   ABO/RH(D)      O POS Performed at [facility], [address], [place], [place] [address]   Heparin  level (unfractionated)      Status: Abnormal   Collection Time: 09/05/24 12:45 PM  Result Value Ref Range   Heparin  Unfractionated 0.24 (L) 0.30 - 0.70 IU/mL    Comment: (NOTE) The clinical reportable range upper limit is being lowered to >1.10 to align with the FDA approved guidance for the current laboratory assay.  If heparin  results are below expected values, and patient dosage has  been confirmed, suggest follow up testing of antithrombin III levels. Performed at [facility], [address], [place], [place] [address]   Heparin  level (unfractionated)     Status: None   Collection Time: 09/05/24  9:56 PM  Result Value Ref Range   Heparin  Unfractionated 0.41 0.30 - 0.70 IU/mL    Comment: (NOTE) The clinical reportable range upper limit is being lowered to >1.10 to align with the FDA approved guidance for the current laboratory assay.  If heparin  results are below expected values, and patient dosage has  been confirmed, suggest follow up testing of antithrombin III levels. Performed at [facility], [address], [place], [place] [address]   CBC     Status: Abnormal   Collection Time: 09/06/24  2:27 AM  Result Value Ref Range   WBC 4.8 4.0 - 10.5 K/uL   RBC 2.96 (L) 3.87 - 5.11 MIL/uL   Hemoglobin 9.3 (L) 12.0 - 15.0 g/dL   HCT 69.9 (L) 63.9 - 53.9 %   MCV 101.4 (H) 80.0 - 100.0 fL   MCH 31.4 26.0 - 34.0 pg   MCHC 31.0 30.0 - 36.0 g/dL   RDW 86.0 88.4 - 84.4 %   Platelets 255 150 - 400 K/uL  nRBC 0.0 0.0 - 0.2 %    Comment: Performed at [facility], [address] [place], [place] [address]  Renal function panel     Status: Abnormal   Collection Time: 09/06/24  2:27 AM  Result Value Ref Range   Sodium 141 135 - 145 mmol/L   Potassium 4.7 3.5 - 5.1 mmol/L   Chloride 109 98 - 111 mmol/L   CO2 12 (L) 22 - 32 mmol/L   Glucose, Bld 120 (H) 70 - 99 mg/dL    Comment: Glucose reference range applies only to samples taken after fasting for at least 8 hours.   BUN 92 (H) 6 - 20  mg/dL   Creatinine, Ser 88.29 (H) 0.44 - 1.00 mg/dL   Calcium  9.1 8.9 - 10.3 mg/dL   Phosphorus 7.7 (H) 2.5 - 4.6 mg/dL   Albumin  3.2 (L) 3.5 - 5.0 g/dL   GFR, Estimated 4 (L) >60 mL/min    Comment: (NOTE) Calculated using the CKD-EPI Creatinine Equation (2021)    Anion gap 20 (H) 5 - 15    Comment: Performed at [facility], [address] [place], [place] [address]  Heparin  level (unfractionated)     Status: None   Collection Time: 09/06/24  2:27 AM  Result Value Ref Range   Heparin  Unfractionated 0.54 0.30 - 0.70 IU/mL    Comment: (NOTE) The clinical reportable range upper limit is being lowered to >1.10 to align with the FDA approved guidance for the current laboratory assay.  If heparin  results are below expected values, and patient dosage has  been confirmed, suggest follow up testing of antithrombin III levels. Performed at [facility], [address], [place], [place] [address]    ECHOCARDIOGRAM COMPLETE Result Date: 09/05/2024    ECHOCARDIOGRAM REPORT   Patient Name:   Brooke Baldwin Date of Exam: 09/05/2024 Medical Rec #:  [id number]            Height:       62.0 in Accession #:    [id number]           Weight:       171.5 lb Date of Birth:  06-11-1976           BSA:          1.791 m Patient Age:    48 years             BP:           145/93 mmHg Patient Gender: F                    HR:           95 bpm. Exam Location:  Inpatient Procedure: 2D Echo, Cardiac Doppler and Color Doppler (Both Spectral and Color            Flow Doppler were utilized during procedure). Indications:    NSTEMI I21.4  History:        Patient has prior history of Echocardiogram examinations and                 Patient has no prior history of Echocardiogram examinations.  Sonographer:    Jayson Gaskins Referring Phys: [id number] SUBRINA SUNDIL IMPRESSIONS  1. Left ventricular ejection fraction, by estimation, is 50 to 55%. The left ventricle has low normal function. The left ventricle  demonstrates global hypokinesis. Left ventricular diastolic parameters were normal.  2. Right ventricular systolic function is normal.  The right ventricular size is normal.  3. The mitral valve is normal in structure. Mild to moderate mitral valve regurgitation. No evidence of mitral stenosis.  4. The aortic valve is normal in structure. Aortic valve regurgitation is not visualized. No aortic stenosis is present.  5. The inferior vena cava is normal in size with greater than 50% respiratory variability, suggesting right atrial pressure of 3 mmHg. FINDINGS  Left Ventricle: Left ventricular ejection fraction, by estimation, is 50 to 55%. The left ventricle has low normal function. The left ventricle demonstrates global hypokinesis. The left ventricular internal cavity size was normal in size. There is no left ventricular hypertrophy. Left ventricular diastolic parameters were normal. Right Ventricle: The right ventricular size is normal. No increase in right ventricular wall thickness. Right ventricular systolic function is normal. Left Atrium: Left atrial size was normal in size. Right Atrium: Right atrial size was normal in size. Pericardium: There is no evidence of pericardial effusion. Mitral Valve: The mitral valve is normal in structure. Mild to moderate mitral valve regurgitation. No evidence of mitral valve stenosis. Tricuspid Valve: The tricuspid valve is normal in structure. Tricuspid valve regurgitation is not demonstrated. No evidence of tricuspid stenosis. Aortic Valve: The aortic valve is normal in structure. Aortic valve regurgitation is not visualized. No aortic stenosis is present. Aortic valve mean gradient measures 5.0 mmHg. Aortic valve peak gradient measures 7.5 mmHg. Aortic valve area, by VTI measures 2.44 cm. Pulmonic Valve: The pulmonic valve was normal in structure. Pulmonic valve regurgitation is not visualized. No evidence of pulmonic stenosis. Aorta: The aortic root is normal in size and  structure. Venous: The inferior vena cava is normal in size with greater than 50% respiratory variability, suggesting right atrial pressure of 3 mmHg. IAS/Shunts: No atrial level shunt detected by color flow Doppler.  LEFT VENTRICLE PLAX 2D LVIDd:         5.40 cm   Diastology LVIDs:         3.80 cm   LV e' medial:    8.05 cm/s LV PW:         0.80 cm   LV E/e' medial:  13.2 LV IVS:        0.80 cm   LV e' lateral:   9.57 cm/s LVOT diam:     2.00 cm   LV E/e' lateral: 11.1 LV SV:         65 LV SV Index:   36 LVOT Area:     3.14 cm  RIGHT VENTRICLE RV S prime:     12.80 cm/s TAPSE (M-mode): 2.2 cm LEFT ATRIUM             Index        RIGHT ATRIUM          Index LA Vol (A2C):   34.1 ml 19.04 ml/m  RA Area:     9.75 cm LA Vol (A4C):   33.7 ml 18.82 ml/m  RA Volume:   22.30 ml 12.45 ml/m LA Biplane Vol: 36.0 ml 20.10 ml/m  AORTIC VALVE AV Area (Vmax):    2.14 cm AV Area (Vmean):   2.08 cm AV Area (VTI):     2.44 cm AV Vmax:           137.00 cm/s AV Vmean:          103.000 cm/s AV VTI:            0.266 m AV Peak Grad:      7.5  mmHg AV Mean Grad:      5.0 mmHg LVOT Vmax:         93.40 cm/s LVOT Vmean:        68.100 cm/s LVOT VTI:          0.207 m LVOT/AV VTI ratio: 0.78  AORTA Ao Root diam: 2.90 cm MITRAL VALVE                TRICUSPID VALVE MV Area (PHT): 3.93 cm     TR Peak grad:   15.7 mmHg MV Decel Time: 193 msec     TR Vmax:        198.00 cm/s MV E velocity: 106.00 cm/s MV A velocity: 116.00 cm/s  SHUNTS MV E/A ratio:  0.91         Systemic VTI:  0.21 m                             Systemic Diam: 2.00 cm Dub Tobb DO Electronically signed by Dub Huntsman DO Signature Date/Time: 09/05/2024/2:17:38 PM    Final    DG Chest Port 1 View Result Date: 09/05/2024 EXAM: 1 VIEW(S) XRAY OF THE CHEST 09/04/2024 10:59:00 PM COMPARISON: 06/26/2024 CLINICAL HISTORY: Chest pain. FINDINGS: LUNGS AND PLEURA: No focal pulmonary opacity. Blunting of the left costophrenic angle. No pneumothorax. HEART AND MEDIASTINUM: No acute  abnormality of the cardiac and mediastinal silhouettes. BONES AND SOFT TISSUES: 2.8 cm calcified density in left thoracic inlet may represent calcified lymph node or calcified thyroid  nodule. No acute osseous abnormality. IMPRESSION: 1. Blunting of the left costophrenic angle may represent small pleural effusion . 2. 2.8 cm calcified density in the left thoracic inlet, which may represent a calcified lymph node or calcified thyroid  nodule. Electronically signed by: Greig Pique MD 09/05/2024 12:14 AM EST RP Workstation: [id number]    PMH:   Past Medical History:  Diagnosis Date   Anemia associated with chronic renal failure    Atrophic kidney    bilateral   CIN I (cervical intraepithelial neoplasia I)    ESRD (end stage renal disease) on dialysis (HCC) 05/2020   hemodialysis  T/ Th/ Sat  @ [facility] ([address] in [place], ( 10-17-2020  s/p left AVG 11/ 2021 unable to use as of yet, currently using right IJ tunneled cath (right side of upper chest) for dialysis   FSGS (focal segmental glomerulosclerosis)    renal biopsy 04-15-2020 in epic , secondary to HTN/ HIV/ Obese   Heart palpitations 11/ 2021  (10-17-2020  pt stated the palpitations does not bother her and has no other symptoms , stated they started 11/ 2021)   pt was been seen by cardiology, dr s. emelie ([facility] in [place]),  per pt had event monitor for 17 days , was told normal with  no arrhythmias,  and stated echo is scheduled for 10-28-2020   History of vulvar dysplasia    HIV (human immunodeficiency virus infection) (HCC)    followed by ID-- dr a. wallace   Hypertension    S/P arteriovenous (AV) fistula creation 07-04-2020 @[facility]    left forearm,  07-28-2020  angioplasty of thrombosis   Secondary hyperparathyroidism of renal origin    Tachycardia    Thrombocytopenia 07/01/2020   Trigeminal neuralgia    ride side ---  neurologist--- dr onita   (10-17-2020  pt stated last episode about 4 months ago was mild)   VIN II (vulvar  intraepithelial neoplasia II)  PSH:   Past Surgical History:  Procedure Laterality Date   A/V FISTULAGRAM Left 07/28/2020   Procedure: A/V FISTULAGRAM;  Surgeon: Gretta Lonni PARAS, MD;  Location: [facility] INVASIVE CV LAB;  Service: Cardiovascular;  Laterality: Left;   AV FISTULA PLACEMENT Left 07/04/2020   Procedure: INSERTION OF LEFT ARTERIOVENOUS (AV) GORE-TEX GRAFT ARM (ACUSEAL);  Surgeon: Magda Debby SAILOR, MD;  Location: [facility] OR;  Service: Vascular;  Laterality: Left;   CERVICAL BIOPSY  W/ LOOP ELECTRODE EXCISION  2005   CO2 LASER APPLICATION N/A 12/22/2020   Procedure: CO2 LASER APPLICATION;  Surgeon: Eloy Herring, MD;  Location: [facility];  Service: Gynecology;  Laterality: N/A;   INSERTION OF DIALYSIS CATHETER N/A 08/14/2021   Procedure: TUNNELED DIALYSIS CATHETER EXCHANGE;  Surgeon: Eliza Lonni RAMAN, MD;  Location: [facility] OR;  Service: Vascular;  Laterality: N/A;   ORIF ANKLE FRACTURE Right 2012   per pt retained hardware   PERIPHERAL VASCULAR BALLOON ANGIOPLASTY Left 07/28/2020   Procedure: PERIPHERAL VASCULAR BALLOON ANGIOPLASTY;  Surgeon: Gretta Lonni PARAS, MD;  Location: [facility] INVASIVE CV LAB;  Service: Cardiovascular;  Laterality: Left;  AVF   VULVA SURGERY  2013   excision    Allergies: Allergies[1]  Medications:   Prior to Admission medications  Medication Sig Start Date End Date Taking? Authorizing Provider  albuterol  (VENTOLIN  HFA) 108 (90 Base) MCG/ACT inhaler Inhale 1-2 puffs into the lungs every 6 (six) hours as needed for wheezing or shortness of breath. 06/15/22  Yes Logan Ubaldo NOVAK, PA-C  amLODipine  (NORVASC ) 5 MG tablet Take 5 mg by mouth daily.   Yes [provider]  bictegravir-emtricitabine -tenofovir  AF (BIKTARVY ) 50-200-25 MG TABS tablet Take 1 tablet by mouth daily. 06/01/24 05/27/25 Yes Dennise Kingsley, MD  calcitRIOL (ROCALTROL) 0.5 MCG capsule Take 0.5 mcg by mouth daily.   Yes [provider]  sodium bicarbonate  650 MG  tablet Take 1 tablet (650 mg total) by mouth 2 (two) times daily. 12/28/21  Yes Sebastian Toribio GAILS, MD  triamcinolone  ointment (KENALOG ) 0.1 % Apply 1 Application topically at bedtime as needed. 08/20/24  Yes [provider]  amLODipine  (NORVASC ) 10 MG tablet Take 1 tablet (10 mg total) by mouth daily. Patient not taking: Reported on 09/05/2024 12/28/21 09/05/24  Sebastian Toribio GAILS, MD    Discontinued Meds:   Medications Discontinued During This Encounter  Medication Reason   aspirin  tablet 325 mg    sodium bicarbonate  tablet 650 mg    sodium bicarbonate  tablet 650 mg    bictegravir-emtricitabine -tenofovir  AF (BIKTARVY ) 50-200-25 MG per tablet 1 tablet    dolutegravir -lamiVUDine  (DOVATO ) 50-300 MG per tablet 1 tablet    budesonide -formoterol  (SYMBICORT ) 80-4.5 MCG/ACT inhaler Patient Preference   doxycycline  (VIBRAMYCIN ) 100 MG capsule Completed Course   ondansetron  (ZOFRAN -ODT) 4 MG disintegrating tablet Completed Course   predniSONE  (DELTASONE ) 20 MG tablet Completed Course   ergocalciferol  (VITAMIN D2) (DRISDOL ) 8000 units/mL (200 mcg/mL) drops 8,000 Units     Social History:  reports that she has never smoked. She has never used smokeless tobacco. She reports that she does not currently use alcohol. She reports that she does not currently use drugs.  Family History:   Family History  Problem Relation Age of Onset   Diabetes Mother    Healthy Father    Breast cancer Maternal Aunt    Pancreatic cancer Maternal Grandfather    Colon cancer Neg Hx    Colon polyps Neg Hx    Esophageal cancer Neg Hx    Rectal cancer  Neg Hx    Stomach cancer Neg Hx    Prostate cancer Neg Hx    Endometrial cancer Neg Hx    Ovarian cancer Neg Hx     Blood pressure (!) 157/96, pulse 91, temperature (!) 97.5 F (36.4 C), temperature source Oral, resp. rate 20, height 5' 2 (1.575 m), weight 76.9 kg, last menstrual period 04/02/2015, SpO2 100%. Physical Exam: General appearance: NAD Head:  NCAT Back: symmetric, no curvature.  Resp: wheezing on right Cardio: regular rate and rhythm GI: SNDNT+BS Extremities: extremities normal, atraumatic, no cyanosis or edema Pulses: 2+ and symmetric Lt FAL thrombosed     Maddux Vanscyoc, LYNWOOD ORN, MD 09/06/2024, 11:56 AM      [1]  Allergies Allergen Reactions   Lioresal  [Baclofen ] Other (See Comments)    Intolerable dizziness   Neurontin  [Gabapentin ] Other (See Comments)    Intolerable dizziness   Penicillins Other (See Comments)    Childhood allergy Unknown reaction

## 2024-09-07 ENCOUNTER — Encounter (HOSPITAL_COMMUNITY): Payer: Self-pay | Admitting: Family Medicine

## 2024-09-07 ENCOUNTER — Inpatient Hospital Stay (HOSPITAL_COMMUNITY)

## 2024-09-07 ENCOUNTER — Other Ambulatory Visit (HOSPITAL_COMMUNITY): Payer: Self-pay

## 2024-09-07 ENCOUNTER — Telehealth (HOSPITAL_COMMUNITY): Payer: Self-pay

## 2024-09-07 DIAGNOSIS — E7800 Pure hypercholesterolemia, unspecified: Secondary | ICD-10-CM

## 2024-09-07 DIAGNOSIS — I2489 Other forms of acute ischemic heart disease: Secondary | ICD-10-CM | POA: Diagnosis not present

## 2024-09-07 DIAGNOSIS — I214 Non-ST elevation (NSTEMI) myocardial infarction: Secondary | ICD-10-CM | POA: Diagnosis not present

## 2024-09-07 DIAGNOSIS — I161 Hypertensive emergency: Secondary | ICD-10-CM

## 2024-09-07 LAB — RENAL FUNCTION PANEL
Albumin: 3.5 g/dL (ref 3.5–5.0)
Anion gap: 14 (ref 5–15)
BUN: 87 mg/dL — ABNORMAL HIGH (ref 6–20)
CO2: 20 mmol/L — ABNORMAL LOW (ref 22–32)
Calcium: 9.3 mg/dL (ref 8.9–10.3)
Chloride: 109 mmol/L (ref 98–111)
Creatinine, Ser: 11.8 mg/dL — ABNORMAL HIGH (ref 0.44–1.00)
GFR, Estimated: 4 mL/min — ABNORMAL LOW
Glucose, Bld: 92 mg/dL (ref 70–99)
Phosphorus: 6 mg/dL — ABNORMAL HIGH (ref 2.5–4.6)
Potassium: 4.3 mmol/L (ref 3.5–5.1)
Sodium: 143 mmol/L (ref 135–145)

## 2024-09-07 LAB — CBC
HCT: 28 % — ABNORMAL LOW (ref 36.0–46.0)
Hemoglobin: 8.9 g/dL — ABNORMAL LOW (ref 12.0–15.0)
MCH: 30.9 pg (ref 26.0–34.0)
MCHC: 31.8 g/dL (ref 30.0–36.0)
MCV: 97.2 fL (ref 80.0–100.0)
Platelets: 261 10*3/uL (ref 150–400)
RBC: 2.88 MIL/uL — ABNORMAL LOW (ref 3.87–5.11)
RDW: 13.4 % (ref 11.5–15.5)
WBC: 7 10*3/uL (ref 4.0–10.5)
nRBC: 0 % (ref 0.0–0.2)

## 2024-09-07 LAB — HEPARIN LEVEL (UNFRACTIONATED): Heparin Unfractionated: 0.56 [IU]/mL (ref 0.30–0.70)

## 2024-09-07 MED ORDER — ALTEPLASE 2 MG IJ SOLR: 2.0000 mg | Freq: Once | INTRAMUSCULAR | Status: DC | PRN

## 2024-09-07 MED ORDER — VANCOMYCIN HCL IN DEXTROSE 1-5 GM/200ML-% IV SOLN
INTRAVENOUS | Status: AC | PRN
  Administered 2024-09-07: 1000 mg via INTRAVENOUS

## 2024-09-07 MED ORDER — LIDOCAINE-PRILOCAINE 2.5-2.5 % EX CREA: 1.0000 | TOPICAL_CREAM | CUTANEOUS | Status: DC | PRN

## 2024-09-07 MED ORDER — MIDAZOLAM HCL (PF) 2 MG/2ML IJ SOLN
INTRAMUSCULAR | Status: AC | PRN
  Administered 2024-09-07 (×2): 1 mg via INTRAVENOUS

## 2024-09-07 MED ORDER — FENTANYL CITRATE (PF) 100 MCG/2ML IJ SOLN
INTRAMUSCULAR | Status: AC
  Filled 2024-09-07: qty 2

## 2024-09-07 MED ORDER — LIDOCAINE-EPINEPHRINE 1 %-1:100000 IJ SOLN: 20.0000 mL | Freq: Once | INTRAMUSCULAR | Status: AC

## 2024-09-07 MED ORDER — NEPRO/CARBSTEADY PO LIQD
237.0000 mL | Freq: Two times a day (BID) | ORAL | Status: AC
  Administered 2024-09-07 – 2024-09-11 (×5): 237 mL via ORAL

## 2024-09-07 MED ORDER — LIDOCAINE-EPINEPHRINE 1 %-1:100000 IJ SOLN
INTRAMUSCULAR | Status: AC
  Filled 2024-09-07: qty 1

## 2024-09-07 MED ORDER — VANCOMYCIN HCL IN DEXTROSE 1-5 GM/200ML-% IV SOLN
INTRAVENOUS | Status: AC
  Filled 2024-09-07: qty 200

## 2024-09-07 MED ORDER — MIDAZOLAM HCL 2 MG/2ML IJ SOLN
INTRAMUSCULAR | Status: AC
  Filled 2024-09-07: qty 2

## 2024-09-07 MED ORDER — FENTANYL CITRATE (PF) 100 MCG/2ML IJ SOLN
INTRAMUSCULAR | Status: AC | PRN
  Administered 2024-09-07 (×2): 50 ug via INTRAVENOUS

## 2024-09-07 MED ORDER — PENTAFLUOROPROP-TETRAFLUOROETH EX AERO: 1.0000 | INHALATION_SPRAY | CUTANEOUS | Status: DC | PRN

## 2024-09-07 MED ORDER — HEPARIN SODIUM (PORCINE) 1000 UNIT/ML IJ SOLN
INTRAMUSCULAR | Status: AC
  Filled 2024-09-07: qty 10

## 2024-09-07 MED ORDER — RENA-VITE PO TABS
1.0000 | ORAL_TABLET | Freq: Every day | ORAL | Status: AC
  Administered 2024-09-07 – 2024-09-11 (×5): 1 via ORAL
  Filled 2024-09-07 (×5): qty 1

## 2024-09-07 MED ORDER — HEPARIN SODIUM (PORCINE) 1000 UNIT/ML DIALYSIS
1000.0000 [IU] | INTRAMUSCULAR | Status: DC | PRN
  Administered 2024-09-07: 3200 [IU]

## 2024-09-07 MED ORDER — LIDOCAINE HCL (PF) 1 % IJ SOLN: 5.0000 mL | INTRAMUSCULAR | Status: DC | PRN

## 2024-09-07 MED ORDER — ANTICOAGULANT SODIUM CITRATE 4% (200MG/5ML) IV SOLN: 5.0000 mL | Status: DC | PRN

## 2024-09-07 NOTE — Progress Notes (Signed)
 "  Progress Note  Patient Name: Brooke Baldwin Date of Encounter: 09/07/2024 [facility] Cardiologist: Dub Huntsman, DO   Interval Summary   Denied chest pain or shortness of breath.  Asked about clarification on what a troponin value means and what a MI Type II means. Education provided.   Vital Signs Vitals:   09/07/24 0845 09/07/24 0850 09/07/24 0855 09/07/24 1002  BP: (!) 141/95 (!) 140/93 (!) 145/95 (!) 149/90  Pulse: 90 93 85 (!) 122  Resp: 17 16 16 18   Temp:    97.7 F (36.5 C)  TempSrc:    Oral  SpO2: 100% 99% 99% 96%  Weight:      Height:        Intake/Output Summary (Last 24 hours) at 09/07/2024 1114 Last data filed at 09/07/2024 9367 Gross per 24 hour  Intake 712.3 ml  Output --  Net 712.3 ml      09/07/2024    5:00 AM 09/06/2024    5:14 AM 09/05/2024    3:02 AM  Last 3 Weights  Weight (lbs) 169 lb 11.2 oz 169 lb 8 oz 171 lb 8.3 oz  Weight (kg) 76.975 kg 76.885 kg 77.8 kg      Telemetry/ECG  NSR HR ~90, with one episode of ST HR 120 - Personally Reviewed  Physical Exam  GEN: No acute distress.   Cardiac: RRR, no murmurs, rubs, or gallops. HD catheter with clean, dry dressing.  Respiratory: Clear to auscultation bilaterally.  Echocardiogram  IMPRESSIONS     1. Left ventricular ejection fraction, by estimation, is 50 to 55%. The  left ventricle has low normal function. The left ventricle demonstrates  global hypokinesis. Left ventricular diastolic parameters were normal.   2. Right ventricular systolic function is normal. The right ventricular  size is normal.   3. The mitral valve is normal in structure. Mild to moderate mitral valve  regurgitation. No evidence of mitral stenosis.   4. The aortic valve is normal in structure. Aortic valve regurgitation is  not visualized. No aortic stenosis is present.   5. The inferior vena cava is normal in size with greater than 50%  respiratory variability, suggesting right atrial pressure of 3 mmHg.    Assessment & Plan  Brooke Baldwin is a 49 y.o. female with a hx of CKD stage V, anemia of CKD, HIV who presented to the ED for chest pain and shortness of breath. In the ED, BP elevated to 183/114. Oxygen 100% on room air. Creatinine elevated to 11.90, K 5.7. hsTn [id number]. Pro-BNP >35,000. Hemoglobin 8.6. CXR with possible small pleural effusion. EKG showed NSR with lateral TWI.Cardiology and Nephrology consulted.   NSTEMI Type II Suspected to be demand ischemia 2/2 ESRD and hypertensive urgency.  Though there were ECG changes, her echo was without RWMA or LV dysfunction.Decision was made to defer ischemic evaluation with renal function.   Currently chest pain free.    Did report recent viral illness and current admission with ESRD not on HD prior. Inflammatory markers are elevated though also in the context of renal failure. As she is now chest pain free, ECG without evidence of pericarditis, and no pericardial effusion most likely not pericarditis will hold off on anti-inflammatories.   Hypertension BP: 142/90 Patient reported running out of hypertension medications.   Continue amlodipine  10 mg  Continue coreg  3.125 mg this morning, most likely increase tomorrow, deferring today as she is set to have first HD session this afternoon.  Mild to moderate MR Continue to follow outpatient.   Per primary ESRD Electrolyte disturbances HIV ACOD   For questions or updates, please contact [facility] Please consult [email or web address] for contact info under       Signed, Leontine LOISE Salen, PA-C   "

## 2024-09-07 NOTE — Procedures (Signed)
" °  Procedure:  Tunneled R internal jugular HD catheter placement Palindrome 19 to prox RA Preprocedure diagnosis: The primary encounter diagnosis was ESRD (end stage renal disease) (HCC). Diagnoses of Chest pain, unspecified type, Elevated troponin, Hyperkalemia, and HIV infection, unspecified symptom status (HCC) were also pertinent to this visit. Postprocedure diagnosis: same EBL:    minimal Complications:   none immediate  See full dictation in [employer].  CHARM Toribio Faes MD Main # [phone] Pager  [phone] Mobile [phone]    "

## 2024-09-07 NOTE — Progress Notes (Signed)
 [facility] PROGRESS NOTE    Progress Note  Brooke Baldwin  FMW:[id number] DOB: [birth year] DOA: 09/04/2024 PCP: Cleatus Donovan RAMAN, MD     Brief Narrative:   Brooke Baldwin is an 49 y.o. 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  In the ED blood pressure was 183/114, with a potassium of 5.7 CO2 of 14 creatinine of 12 cardiac biomarkers in the 1400s, chest x-ray showed small left-sided pleural effusion, he was given Lokelma  aspirin  and started on IV heparin  drip.  Assessment/Plan:   Elevated troponin/Atypical chest pain: Twelve-lead EKG showed normal sinus rhythm no ST segment abnormalities, cardiac biomarkers troponins of 1500. Cardiology thinks is more pleurisy, very unlikely an acute coronary syndrome. Heparin  was discontinued. 2D echo showed no effusion, CRP of 5, Continue current conservative treatment, no indication for ischemic workup.  Hypertensive urgency: She says she ran out with blood pressure medications for about 3 weeks. Coreg  and amlodipine  blood pressures improved this morning. This could have been contributing to her chest pain.  Chronic kidney disease stage V/metabolic acidosis with increased anion gap and accumulation of organic acids: Been followed by [facility] for about 2 months she has been resistant to permanent accessed. Has been noncompliant with her follow-ups and appointment with nephrology as an outpatient. She agreed to temporary tunneled catheter to be placed on 09/07/2024 Nephrology recommended to start dialysis on 09/07/2024. She will need to be clipped to her dialysis center.  Hyperkalemia: Resolved with oral Lokelma .  Hyperphosphatemia: Continue phosphate binder, as a phosphorus of 7. Further management per  nephrology.  Hypertensive urgency: Started on amlodipine  and IV hydralazine  as needed.  Anemia of chronic renal disease: Her hemoglobin this morning is 9.3 continue to monitor.  Vitamin D  deficiency: Started on replacement.  HIV (human immunodeficiency virus infection) (HCC) Resume her home medications, the patient refused switching to Dovato  she was continue Biktarvy  despite knowing the risk because of her renal dysfunction   DVT prophylaxis: Lovenox Family Communication:none Status is: Inpatient Remains inpatient appropriate because: chest pain    Code Status:     Code Status Orders  (From admission, onward)           Start     Ordered   09/05/24 0728  Full code  Continuous       Question:  By:  Answer:  Consent: discussion documented in EHR   09/05/24 0730           Code Status History     Date Active Date Inactive Code Status Order ID Comments User Context   12/27/2021 2305 12/28/2021 2200 Full Code [id number]  Laurence Locus, DO Inpatient   10/21/2021 2105 10/25/2021 2344 Full Code [id number]  Lonzell Emeline HERO, DO ED   08/13/2021 0013 08/15/2021 0556 Full Code [id number]  Silvester Ales, MD ED   12/22/2020 0827 12/22/2020 1744 Full Code [id number]  Micheline Eleanor BIRCH, NP Inpatient   06/30/2020 1313 07/12/2020 2217 Full Code [id number]  Tobie Mario GAILS, MD ED         IV Access:   Peripheral IV   Procedures and diagnostic studies:   ECHOCARDIOGRAM COMPLETE Result Date: 09/05/2024    ECHOCARDIOGRAM REPORT   Patient Name:   Brooke Baldwin Date of Exam: 09/05/2024 Medical Rec #:  [id number]  Height:       62.0 in Accession #:    [id number]           Weight:       171.5 lb Date of Birth:  August 26, 1975           BSA:          1.791 m Patient Age:    48 years             BP:           145/93 mmHg Patient Gender: F                    HR:           95 bpm. Exam Location:  Inpatient Procedure: 2D Echo, Cardiac Doppler and Color Doppler (Both Spectral and Color             Flow Doppler were utilized during procedure). Indications:    NSTEMI I21.4  History:        Patient has prior history of Echocardiogram examinations and                 Patient has no prior history of Echocardiogram examinations.  Sonographer:    Jayson Gaskins Referring Phys: [id number] SUBRINA SUNDIL IMPRESSIONS  1. Left ventricular ejection fraction, by estimation, is 50 to 55%. The left ventricle has low normal function. The left ventricle demonstrates global hypokinesis. Left ventricular diastolic parameters were normal.  2. Right ventricular systolic function is normal. The right ventricular size is normal.  3. The mitral valve is normal in structure. Mild to moderate mitral valve regurgitation. No evidence of mitral stenosis.  4. The aortic valve is normal in structure. Aortic valve regurgitation is not visualized. No aortic stenosis is present.  5. The inferior vena cava is normal in size with greater than 50% respiratory variability, suggesting right atrial pressure of 3 mmHg. FINDINGS  Left Ventricle: Left ventricular ejection fraction, by estimation, is 50 to 55%. The left ventricle has low normal function. The left ventricle demonstrates global hypokinesis. The left ventricular internal cavity size was normal in size. There is no left ventricular hypertrophy. Left ventricular diastolic parameters were normal. Right Ventricle: The right ventricular size is normal. No increase in right ventricular wall thickness. Right ventricular systolic function is normal. Left Atrium: Left atrial size was normal in size. Right Atrium: Right atrial size was normal in size. Pericardium: There is no evidence of pericardial effusion. Mitral Valve: The mitral valve is normal in structure. Mild to moderate mitral valve regurgitation. No evidence of mitral valve stenosis. Tricuspid Valve: The tricuspid valve is normal in structure. Tricuspid valve regurgitation is not demonstrated. No evidence of tricuspid stenosis. Aortic  Valve: The aortic valve is normal in structure. Aortic valve regurgitation is not visualized. No aortic stenosis is present. Aortic valve mean gradient measures 5.0 mmHg. Aortic valve peak gradient measures 7.5 mmHg. Aortic valve area, by VTI measures 2.44 cm. Pulmonic Valve: The pulmonic valve was normal in structure. Pulmonic valve regurgitation is not visualized. No evidence of pulmonic stenosis. Aorta: The aortic root is normal in size and structure. Venous: The inferior vena cava is normal in size with greater than 50% respiratory variability, suggesting right atrial pressure of 3 mmHg. IAS/Shunts: No atrial level shunt detected by color flow Doppler.  LEFT VENTRICLE PLAX 2D LVIDd:         5.40 cm   Diastology LVIDs:  3.80 cm   LV e' medial:    8.05 cm/s LV PW:         0.80 cm   LV E/e' medial:  13.2 LV IVS:        0.80 cm   LV e' lateral:   9.57 cm/s LVOT diam:     2.00 cm   LV E/e' lateral: 11.1 LV SV:         65 LV SV Index:   36 LVOT Area:     3.14 cm  RIGHT VENTRICLE RV S prime:     12.80 cm/s TAPSE (M-mode): 2.2 cm LEFT ATRIUM             Index        RIGHT ATRIUM          Index LA Vol (A2C):   34.1 ml 19.04 ml/m  RA Area:     9.75 cm LA Vol (A4C):   33.7 ml 18.82 ml/m  RA Volume:   22.30 ml 12.45 ml/m LA Biplane Vol: 36.0 ml 20.10 ml/m  AORTIC VALVE AV Area (Vmax):    2.14 cm AV Area (Vmean):   2.08 cm AV Area (VTI):     2.44 cm AV Vmax:           137.00 cm/s AV Vmean:          103.000 cm/s AV VTI:            0.266 m AV Peak Grad:      7.5 mmHg AV Mean Grad:      5.0 mmHg LVOT Vmax:         93.40 cm/s LVOT Vmean:        68.100 cm/s LVOT VTI:          0.207 m LVOT/AV VTI ratio: 0.78  AORTA Ao Root diam: 2.90 cm MITRAL VALVE                TRICUSPID VALVE MV Area (PHT): 3.93 cm     TR Peak grad:   15.7 mmHg MV Decel Time: 193 msec     TR Vmax:        198.00 cm/s MV E velocity: 106.00 cm/s MV A velocity: 116.00 cm/s  SHUNTS MV E/A ratio:  0.91         Systemic VTI:  0.21 m                              Systemic Diam: 2.00 cm Kardie Tobb DO Electronically signed by Dub Huntsman DO Signature Date/Time: 09/05/2024/2:17:38 PM    Final      Medical Consultants:   None.   Subjective:    Brooke Baldwin V chest pain is resolved.  Objective:    Vitals:   09/07/24 0845 09/07/24 0850 09/07/24 0855 09/07/24 1002  BP: (!) 141/95 (!) 140/93 (!) 145/95   Pulse: 90 93 85   Resp: 17 16 16    Temp:      TempSrc:    Oral  SpO2: 100% 99% 99%   Weight:      Height:       SpO2: 99 % O2 Flow Rate (L/min): 2 L/min FiO2 (%): 21 %   Intake/Output Summary (Last 24 hours) at 09/07/2024 1003 Last data filed at 09/07/2024 9367 Gross per 24 hour  Intake 712.3 ml  Output --  Net 712.3 ml   Filed Weights   09/05/24 0302 09/06/24 0514  09/07/24 0500  Weight: 77.8 kg 76.9 kg 77 kg    Exam: General exam: In no acute distress. Respiratory system: Good air movement and clear to auscultation. Cardiovascular system: S1 & S2 heard, RRR. No JVD. Gastrointestinal system: Abdomen is nondistended, soft and nontender.  Extremities: No pedal edema. Skin: No rashes, lesions or ulcers Psychiatry: Judgement and insight appear normal. Mood & affect appropriate.  Data Reviewed:    Labs: Basic Metabolic Panel: Recent Labs  Lab 09/04/24 2238 09/05/24 0921 09/05/24 0922 09/06/24 0227  NA 140  --  141 141  K 5.7*  --  4.9 4.7  CL 107  --  109 109  CO2 14*  --  12* 12*  GLUCOSE 86  --  74 120*  BUN 94*  --  93* 92*  CREATININE 11.90*  --  11.50* 11.70*  CALCIUM  9.4  --  9.4 9.1  PHOS  --  7.6*  --  7.7*   GFR Estimated Creatinine Clearance: 5.5 mL/min (A) (by C-G formula based on SCr of 11.7 mg/dL (H)). Liver Function Tests: Recent Labs  Lab 09/05/24 0921 09/06/24 0227  AST 21  --   ALT 12  --   ALKPHOS 112  --   BILITOT 0.4  --   PROT 7.0  --   ALBUMIN  3.5 3.2*   No results for input(s): LIPASE, AMYLASE in the last 168 hours. No results for input(s): AMMONIA in the  last 168 hours. Coagulation profile No results for input(s): INR, PROTIME in the last 168 hours. COVID-19 Labs  Recent Labs    09/05/24 0921  CRP 4.9*    Lab Results  Component Value Date   SARSCOV2NAA NEGATIVE 08/12/2021   SARSCOV2NAA NEGATIVE 04/07/2021   SARSCOV2NAA NEGATIVE 11/01/2020   SARSCOV2NAA NEGATIVE 10/17/2020    CBC: Recent Labs  Lab 09/04/24 2238 09/05/24 0921 09/06/24 0227 09/07/24 0231  WBC 6.7 6.3 4.8 7.0  NEUTROABS  --  4.1  --   --   HGB 8.6* 9.1* 9.3* 8.9*  HCT 27.0* 29.2* 30.0* 28.0*  MCV 97.8 100.0 101.4* 97.2  PLT 271 233 255 261   Cardiac Enzymes: No results for input(s): CKTOTAL, CKMB, CKMBINDEX, TROPONINI in the last 168 hours. BNP (last 3 results) Recent Labs    09/05/24 0048  PROBNP >35,000.0*   CBG: No results for input(s): GLUCAP in the last 168 hours. D-Dimer: No results for input(s): DDIMER in the last 72 hours. Hgb A1c: No results for input(s): HGBA1C in the last 72 hours. Lipid Profile: No results for input(s): CHOL, HDL, LDLCALC, TRIG, CHOLHDL, LDLDIRECT in the last 72 hours. Thyroid  function studies: No results for input(s): TSH, T4TOTAL, T3FREE, THYROIDAB in the last 72 hours.  Invalid input(s): FREET3 Anemia work up: No results for input(s): VITAMINB12, FOLATE, FERRITIN, TIBC, IRON , RETICCTPCT in the last 72 hours. Sepsis Labs: Recent Labs  Lab 09/04/24 2238 09/05/24 0921 09/06/24 0227 09/07/24 0231  WBC 6.7 6.3 4.8 7.0   Microbiology No results found for this or any previous visit (from the past 240 hours).   Medications:    amLODipine   10 mg Oral Daily   bictegravir-emtricitabine -tenofovir  AF  1 tablet Oral Daily   calcium  carbonate  800 mg of elemental calcium  Oral TID WC   carvedilol   3.125 mg Oral BID WC   Chlorhexidine  Gluconate Cloth  6 each Topical Q0600   cholecalciferol   2,000 Units Oral Daily   ferric citrate   420 mg Oral TID WC    fluticasone  furoate-vilanterol  1 puff Inhalation Daily   lidocaine -EPINEPHrine   20 mL Infiltration Once   sodium bicarbonate   1,300 mg Oral TID   sodium chloride  flush  3 mL Intravenous Q12H   Continuous Infusions:  anticoagulant sodium citrate         LOS: 2 days   Erle Odell Castor  [facility]  09/07/2024, 10:03 AM

## 2024-09-07 NOTE — Plan of Care (Signed)

## 2024-09-08 DIAGNOSIS — I214 Non-ST elevation (NSTEMI) myocardial infarction: Secondary | ICD-10-CM | POA: Diagnosis not present

## 2024-09-08 LAB — CBC
HCT: 27 % — ABNORMAL LOW (ref 36.0–46.0)
Hemoglobin: 8.8 g/dL — ABNORMAL LOW (ref 12.0–15.0)
MCH: 31.3 pg (ref 26.0–34.0)
MCHC: 32.6 g/dL (ref 30.0–36.0)
MCV: 96.1 fL (ref 80.0–100.0)
Platelets: 242 10*3/uL (ref 150–400)
RBC: 2.81 MIL/uL — ABNORMAL LOW (ref 3.87–5.11)
RDW: 13.4 % (ref 11.5–15.5)
WBC: 4.9 10*3/uL (ref 4.0–10.5)
nRBC: 0 % (ref 0.0–0.2)

## 2024-09-08 LAB — LIPID PANEL
Cholesterol: 173 mg/dL (ref 0–200)
HDL: 67 mg/dL
LDL Cholesterol: 83 mg/dL (ref 0–99)
Total CHOL/HDL Ratio: 2.6 ratio
Triglycerides: 116 mg/dL
VLDL: 23 mg/dL (ref 0–40)

## 2024-09-08 LAB — PARATHYROID HORMONE, INTACT (NO CA): PTH: 330 pg/mL — ABNORMAL HIGH (ref 15–65)

## 2024-09-08 NOTE — Plan of Care (Signed)
" °  Problem: Clinical Measurements: Goal: Ability to maintain clinical measurements within normal limits will improve Outcome: Progressing Goal: Will remain free from infection Outcome: Progressing Goal: Respiratory complications will improve Outcome: Progressing Goal: Cardiovascular complication will be avoided Outcome: Progressing   Problem: Activity: Goal: Risk for activity intolerance will decrease Outcome: Progressing   Problem: Nutrition: Goal: Adequate nutrition will be maintained Outcome: Progressing   Problem: Safety: Goal: Ability to remain free from injury will improve Outcome: Progressing   Problem: Fluid Volume: Goal: Compliance with measures to maintain balanced fluid volume will improve Outcome: Progressing   Problem: Nutritional: Goal: Ability to make healthy dietary choices will improve Outcome: Progressing   "

## 2024-09-08 NOTE — Progress Notes (Signed)
 [facility] PROGRESS NOTE    Progress Note  Brooke Baldwin  FMW:[id number] DOB: [birth year] DOA: 09/04/2024 PCP: Cleatus Donovan RAMAN, MD     Brief Narrative:   Brooke Baldwin is an 49 y.o. 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  In the ED blood pressure was 183/114, with a potassium of 5.7 CO2 of 14 creatinine of 12 cardiac biomarkers in the 1400s, chest x-ray showed small left-sided pleural effusion, he was given Lokelma  aspirin  and started on IV heparin  drip.  Assessment/Plan:   Elevated troponin/Atypical chest pain: Twelve-lead EKG showed normal sinus rhythm no ST segment abnormalities, cardiac biomarkers troponins of 1500. Cardiology thinks is more pleurisy, very unlikely an acute coronary syndrome. Heparin  was discontinued. 2D echo showed no effusion, CRP of 5, Continue current conservative treatment, no indication for ischemic workup.  Hypertensive urgency: She says she ran out of her blood pressure medications about 3 weeks. Coreg  and amlodipine  blood pressures improved this morning. This could have been contributing to her chest pain.  Chronic kidney disease stage V/metabolic acidosis with increased anion gap and accumulation of organic acids: Been followed by [facility] for about 2 months she has been resistant to permanent accessed. Has been noncompliant with her follow-ups and appointment with nephrology as an outpatient. She agreed to temporary tunneled catheter to be placed on 09/07/2024. Nephrology recommended to start dialysis on 2-3 2026 She will need to be clipped to her dialysis center.  Hyperkalemia: Resolved with oral Lokelma  and hemodialysis.  Hyperphosphatemia: Continue phosphate binders, phosphorus is coming down. Further management per  nephrology.  Hypertensive urgency: Continue Coreg , amlodipine  and IV hydralazine  as needed.  Anemia of chronic renal disease: Her hemoglobin this morning is 9.3 continue to monitor.  Vitamin D  deficiency: Started on replacement.  HIV (human immunodeficiency virus infection) (HCC) Resume her home medications, the patient refused switching to Dovato  she was continue Biktarvy  despite knowing the risk because of her renal dysfunction   DVT prophylaxis: Lovenox Family Communication:none Status is: Inpatient Remains inpatient appropriate because: chest pain    Code Status:     Code Status Orders  (From admission, onward)           Start     Ordered   09/05/24 0728  Full code  Continuous       Question:  By:  Answer:  Consent: discussion documented in EHR   09/05/24 0730           Code Status History     Date Active Date Inactive Code Status Order ID Comments User Context   12/27/2021 2305 12/28/2021 2200 Full Code [id number]  Laurence Locus, DO Inpatient   10/21/2021 2105 10/25/2021 2344 Full Code [id number]  Lonzell Emeline HERO, DO ED   08/13/2021 0013 08/15/2021 0556 Full Code [id number]  Silvester Ales, MD ED   12/22/2020 0827 12/22/2020 1744 Full Code [id number]  Micheline Eleanor BIRCH, NP Inpatient   06/30/2020 1313 07/12/2020 2217 Full Code [id number]  Tobie Mario GAILS, MD ED         IV Access:   Peripheral IV   Procedures and diagnostic studies:   IR TUNNELED CENTRAL VENOUS CATH [facility] IMG Result Date: 09/07/2024 INDICATION: End-stage renal disease (ESRD) (HCC). EXAM: TUNNELED CENTRAL VENOUS CATHETER PLACEMENT CLINICAL HISTORY: End-stage renal disease (ESRD) (HCC). COMPARISON: None. GRIP-IR: Category: CV Access Subcategory:  Tunneled CVC placement Follow-Up: 1 month MEDICATIONS: 1 gram vancomycin  administered preprocedure. ANESTHESIA/SEDATION: 100 micrograms fentanyl , 2 milligrams versed , 11 minutes. FLUOROSCOPY TIME: Radiation exposure index (as provided by the fluoroscopic  device): 2 mgy air kerma COMPLICATIONS: None immediate. PROCEDURE: Informed written consent was obtained. The right internal jugular vein was accessed under ultrasound guidance. A 19 cm Palindrome hemodialysis catheter was advanced through a subcutaneous tunnel and inserted into the right internal jugular vein. The catheter tip was positioned in the mid right atrium. The catheter was secured and sterile dressings applied. IMPRESSION: 1. Successful placement of a 19 cm palindrome hemodialysis catheter with the tip in the mid right atrium. Electronically signed by: Dayne Hassell MD 09/07/2024 02:11 PM EST RP Workstation: [id number]     Medical Consultants:   None.   Subjective:    Brooke Baldwin V chest pain-free feels great.  Objective:    Vitals:   09/08/24 0044 09/08/24 0637 09/08/24 0723 09/08/24 0724  BP: 130/80 (!) 156/95    Pulse: 93 91 100   Resp: 18 18 16    Temp: 98.8 F (37.1 C) 98 F (36.7 C)    TempSrc: Oral Oral    SpO2: 98% 100% 98% 98%  Weight:  76.7 kg    Height:       SpO2: 98 % O2 Flow Rate (L/min): 2 L/min FiO2 (%): 21 %   Intake/Output Summary (Last 24 hours) at 09/08/2024 0735 Last data filed at 09/07/2024 2030 Gross per 24 hour  Intake 240 ml  Output 500 ml  Net -260 ml   Filed Weights   09/07/24 1446 09/07/24 1741 09/08/24 0637  Weight: 80.6 kg (P) 80.1 kg 76.7 kg    Exam: General exam: In no acute distress. Respiratory system: Good air movement and clear to auscultation. Cardiovascular system: S1 & S2 heard, RRR. No JVD. Gastrointestinal system: Abdomen is nondistended, soft and nontender.  Extremities: No pedal edema. Skin: No rashes, lesions or ulcers Psychiatry: Judgement and insight appear normal. Mood & affect appropriate.  Data Reviewed:    Labs: Basic Metabolic Panel: Recent Labs  Lab 09/04/24 2238 09/05/24 0921 09/05/24 0922 09/06/24 0227 09/07/24 1125  NA 140  --  141 141 143  K 5.7*  --  4.9 4.7 4.3  CL 107  --  109  109 109  CO2 14*  --  12* 12* 20*  GLUCOSE 86  --  74 120* 92  BUN 94*  --  93* 92* 87*  CREATININE 11.90*  --  11.50* 11.70* 11.80*  CALCIUM  9.4  --  9.4 9.1 9.3  PHOS  --  7.6*  --  7.7* 6.0*   GFR Estimated Creatinine Clearance: 5.5 mL/min (A) (by C-G formula based on SCr of 11.8 mg/dL (H)). Liver Function Tests: Recent Labs  Lab 09/05/24 0921 09/06/24 0227 09/07/24 1125  AST 21  --   --   ALT 12  --   --   ALKPHOS 112  --   --   BILITOT 0.4  --   --   PROT 7.0  --   --   ALBUMIN  3.5 3.2* 3.5   No results for input(s): LIPASE, AMYLASE in the last 168 hours. No results for input(s): AMMONIA in the last 168 hours. Coagulation profile No results for input(s): INR, PROTIME in the last 168 hours. COVID-19 Labs  Recent Labs    09/05/24 0921  CRP 4.9*    Lab Results  Component Value Date   SARSCOV2NAA NEGATIVE 08/12/2021  SARSCOV2NAA NEGATIVE 04/07/2021   SARSCOV2NAA NEGATIVE 11/01/2020   SARSCOV2NAA NEGATIVE 10/17/2020    CBC: Recent Labs  Lab 09/04/24 2238 09/05/24 0921 09/06/24 0227 09/07/24 0231 09/08/24 0457  WBC 6.7 6.3 4.8 7.0 4.9  NEUTROABS  --  4.1  --   --   --   HGB 8.6* 9.1* 9.3* 8.9* 8.8*  HCT 27.0* 29.2* 30.0* 28.0* 27.0*  MCV 97.8 100.0 101.4* 97.2 96.1  PLT 271 233 255 261 242   Cardiac Enzymes: No results for input(s): CKTOTAL, CKMB, CKMBINDEX, TROPONINI in the last 168 hours. BNP (last 3 results) Recent Labs    09/05/24 0048  PROBNP >35,000.0*   CBG: No results for input(s): GLUCAP in the last 168 hours. D-Dimer: No results for input(s): DDIMER in the last 72 hours. Hgb A1c: No results for input(s): HGBA1C in the last 72 hours. Lipid Profile: Recent Labs    09/08/24 0457  CHOL 173  HDL 67  LDLCALC 83  TRIG 116  CHOLHDL 2.6   Thyroid  function studies: No results for input(s): TSH, T4TOTAL, T3FREE, THYROIDAB in the last 72 hours.  Invalid input(s): FREET3 Anemia work up: No results  for input(s): VITAMINB12, FOLATE, FERRITIN, TIBC, IRON , RETICCTPCT in the last 72 hours. Sepsis Labs: Recent Labs  Lab 09/05/24 0921 09/06/24 0227 09/07/24 0231 09/08/24 0457  WBC 6.3 4.8 7.0 4.9   Microbiology No results found for this or any previous visit (from the past 240 hours).   Medications:    amLODipine   10 mg Oral Daily   bictegravir-emtricitabine -tenofovir  AF  1 tablet Oral Daily   calcium  carbonate  800 mg of elemental calcium  Oral TID WC   carvedilol   3.125 mg Oral BID WC   Chlorhexidine  Gluconate Cloth  6 each Topical Q0600   cholecalciferol   2,000 Units Oral Daily   feeding supplement (NEPRO CARB STEADY)  237 mL Oral BID BM   ferric citrate   420 mg Oral TID WC   fluticasone  furoate-vilanterol  1 puff Inhalation Daily   multivitamin  1 tablet Oral QHS   sodium bicarbonate   1,300 mg Oral TID   sodium chloride  flush  3 mL Intravenous Q12H   Continuous Infusions:      LOS: 3 days   Erle Odell Castor  [facility]  09/08/2024, 7:35 AM

## 2024-09-09 LAB — RENAL FUNCTION PANEL
Albumin: 3.5 g/dL (ref 3.5–5.0)
Anion gap: 9 (ref 5–15)
BUN: 34 mg/dL — ABNORMAL HIGH (ref 6–20)
CO2: 29 mmol/L (ref 22–32)
Calcium: 9.6 mg/dL (ref 8.9–10.3)
Chloride: 97 mmol/L — ABNORMAL LOW (ref 98–111)
Creatinine, Ser: 5.49 mg/dL — ABNORMAL HIGH (ref 0.44–1.00)
GFR, Estimated: 9 mL/min — ABNORMAL LOW
Glucose, Bld: 158 mg/dL — ABNORMAL HIGH (ref 70–99)
Phosphorus: 2.3 mg/dL — ABNORMAL LOW (ref 2.5–4.6)
Potassium: 4 mmol/L (ref 3.5–5.1)
Sodium: 135 mmol/L (ref 135–145)

## 2024-09-09 LAB — CBC
HCT: 27.9 % — ABNORMAL LOW (ref 36.0–46.0)
Hemoglobin: 8.8 g/dL — ABNORMAL LOW (ref 12.0–15.0)
MCH: 31.2 pg (ref 26.0–34.0)
MCHC: 31.5 g/dL (ref 30.0–36.0)
MCV: 98.9 fL (ref 80.0–100.0)
Platelets: 228 10*3/uL (ref 150–400)
RBC: 2.82 MIL/uL — ABNORMAL LOW (ref 3.87–5.11)
RDW: 13.5 % (ref 11.5–15.5)
WBC: 5.8 10*3/uL (ref 4.0–10.5)
nRBC: 0 % (ref 0.0–0.2)

## 2024-09-09 LAB — HEPATITIS B SURFACE ANTIBODY, QUANTITATIVE: Hep B S AB Quant (Post): <3.5 m[IU]/mL — ABNORMAL LOW

## 2024-09-09 MED ORDER — HEPARIN SODIUM (PORCINE) 1000 UNIT/ML IJ SOLN
INTRAMUSCULAR | Status: AC
  Filled 2024-09-09: qty 6

## 2024-09-09 NOTE — Plan of Care (Signed)

## 2024-09-09 NOTE — Progress Notes (Signed)
 " Progress Note   Patient: Brooke Baldwin FMW:[id number] DOB: [birth year] DOA: 09/04/2024     4 DOS: the patient was seen and examined on 09/09/2024   Brief hospital course: Coley Littles is an 49 y.o. 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  In the ED blood pressure was 183/114, with a potassium of 5.7 CO2 of 14 creatinine of 12 cardiac biomarkers in the 1400s, chest x-ray showed small left-sided pleural effusion, he was given Lokelma  aspirin  and started on IV heparin  drip.   Assessment and Plan:   Elevated troponin/Atypical chest pain: Twelve-lead EKG showed normal sinus rhythm no ST segment abnormalities, cardiac biomarkers troponins of 1500. Cardiology thinks is more pleurisy, very unlikely an acute coronary syndrome. Heparin  was discontinued. 2D echo showed no effusion Continue current conservative treatment, no indication for ischemic workup.   Hypertensive urgency: She says she ran out of her blood pressure medications about 3 weeks. Coreg  and amlodipine  blood pressures improved this morning. This could have been contributing to her chest pain.   Chronic kidney disease stage V/metabolic acidosis with increased anion gap and accumulation of organic acids: Been followed by [facility] for about 2 months she has been resistant to permanent accessed. Has been noncompliant with her follow-ups and appointment with nephrology as an outpatient. She agreed to temporary tunneled catheter to be placed on 09/07/2024. Nephrology recommended to start dialysis on 2-3 2026 Case manager working on outpatient dialysis placement   Hyperkalemia: Resolved with oral Lokelma  and hemodialysis.   Hyperphosphatemia: Continue phosphate binders, phosphorus is coming down. Further management per  nephrology.   Hypertensive urgency: Continue Coreg , amlodipine  and IV hydralazine  as needed.   Anemia of chronic renal disease: Her hemoglobin this morning is 9.3 continue to monitor.   Vitamin D  deficiency: Started on replacement.   HIV (human immunodeficiency virus infection) (HCC) Resume her home medications, the patient refused switching to Dovato  she was continue Biktarvy  despite knowing the risk because of her renal dysfunction     DVT prophylaxis: Lovenox Family Communication:none Status is: Inpatient Remains inpatient appropriate because: chest pain      Subjective:  Seen and examined at bedside this morning Currently pending placement  Physical Exam:  General exam: In no acute distress. Respiratory system: Good air movement and clear to auscultation. Cardiovascular system: S1 & S2 heard, RRR. No JVD. Gastrointestinal system: Abdomen is nondistended, soft and nontender.  Extremities: No pedal edema. Skin: No rashes, lesions or ulcers Psychiatry: Judgement and insight appear normal. Mood & affect appropriate.   Data Reviewed:    Latest Ref Rng & Units 09/09/2024    5:06 AM 09/08/2024    4:57 AM 09/07/2024    2:31 AM  CBC  WBC 4.0 - 10.5 K/uL 5.8  4.9  7.0   Hemoglobin 12.0 - 15.0 g/dL 8.8  8.8  8.9   Hematocrit 36.0 - 46.0 % 27.9  27.0  28.0   Platelets 150 - 400 K/uL 228  242  261        Latest Ref Rng & Units 09/07/2024   11:25 AM 09/06/2024    2:27 AM 09/05/2024    9:22 AM  BMP  Glucose 70 - 99 mg/dL 92  879  74   BUN 6 - 20 mg/dL 87  92  93   Creatinine 0.44 - 1.00 mg/dL  11.80  11.70  11.50   Sodium 135 - 145 mmol/L 143  141  141   Potassium 3.5 - 5.1 mmol/L 4.3  4.7  4.9   Chloride 98 - 111 mmol/L 109  109  109   CO2 22 - 32 mmol/L 20  12  12    Calcium  8.9 - 10.3 mg/dL 9.3  9.1  9.4     Vitals:   09/09/24 1311 09/09/24 1324 09/09/24 1354 09/09/24 1409  BP: (!) 146/92 (!) 143/93 (!) 176/100 (!) 137/93  Pulse: 79 74 85 82  Resp: 14 15 14 13   Temp:       TempSrc:      SpO2: 100% 100% 100% 98%  Weight:      Height:        Author: Drue ONEIDA Potter, MD 09/09/2024 2:34 PM  For on call review [email or web address].  "

## 2024-09-09 NOTE — Plan of Care (Signed)
" °  Problem: Clinical Measurements: Goal: Respiratory complications will improve Outcome: Progressing Goal: Cardiovascular complication will be avoided Outcome: Progressing   Problem: Activity: Goal: Risk for activity intolerance will decrease Outcome: Progressing   Problem: Nutrition: Goal: Adequate nutrition will be maintained Outcome: Progressing   Problem: Coping: Goal: Level of anxiety will decrease Outcome: Progressing   Problem: Elimination: Goal: Will not experience complications related to urinary retention Outcome: Progressing   Problem: Safety: Goal: Ability to remain free from injury will improve Outcome: Progressing   Problem: Nutritional: Goal: Ability to make healthy dietary choices will improve Outcome: Progressing   Problem: Clinical Measurements: Goal: Complications related to the disease process, condition or treatment will be avoided or minimized Outcome: Progressing   "

## 2024-09-09 NOTE — Progress Notes (Signed)
 Spoke to pt via phone. Pt prefers in-center 3x's a week HD at d/c due to work schedule. Referral submitted to [facility] admissions this morning for review. Update provided to nephrologist. Will assist as needed.   Randine Mungo Dialysis Navigator [phone]

## 2024-09-09 NOTE — Progress Notes (Signed)
 Brooke Baldwin is an 49 y.o. female PMH HIV, FSGS, trigeminal neuralgia, uterine fibroids, hypertension, and medical non compliance.  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 HD in December of 2021 but was non compliant with dialysis, sometimes missing >1 month of treatment. Ultimately had wanted to do PD, but when she went to have PD cath placed in Sept 2022 was noted to have large pelvic mass and surgery was aborted (uterine fibroid).    Her TDC was removed in March 2023 due to possible infection and she left the hospital prior to having a new one placed had been without dialysis since that time.   07/14/24 vitamin D  is 8.8, creatinine 12.9, potassium 5.3, hemoglobin 10.1, adequate iron  stores, PTH 1028. Out of antihypertensives and never called the clinic for another rx to be called in; her PCP is in [place] and she keeps traveling back for that. Also has stress from her job with [employer] now with SOB, CP.   Assessment/Plan: CKD5 - She has been followed at [facility] q6-8weeks for many years, resistant to permanent access after  failed attempt x1 (lt FAL AVG) despite multiple attempts to encourage another attempt. She is interested in PD but has canceled appts many times because her mother not being able to come.  Patient trying to get back in with Dr. Shawna.  Because it has been over a year since she had home visit will need to schedule this again. - Now with progressive disease willing to start hemodialysis -Status post TDC with IR on 2/2.  Appreciate help - Plan for dialysis again tomorrow.  Working on clip.  Wants to go to a unit that is 3 times weekly. - Dialysis today then likely again on Friday.  Chest pain with elevated troponin: Evaluated by cardiology.  Seems improved. Hypertension -continue current blood pressure medications.  Ultrafiltration with dialysis as tolerated Secondary hyperparathyroidism:  Prescribed calcitriol in the past.  PTH 330.  No calcitriol needed.  Continue home sevelamer  Anemia of CKD: iron  replete. Cont esa H/o large pedunculated peripherally calcified fibroid measuring at least 9.1 x 6.4 x 14.5 cm.s/p hysterectomy/salpingectomy + vulvar excision on 10/15/22 in [place]. HIV now back on meds   Subjective: Continues to feel well today with no complaints.  Planning for dialysis today.  Clip in process   Chemistry and CBC: Creat  Date/Time Value Ref Range Status  11/12/2023 10:07 AM 8.68 (H) 0.50 - 0.99 mg/dL Final    Comment:    Verified by repeat analysis. [name]   04/12/2022 09:39 AM 5.29 (H) 0.50 - 0.99 mg/dL Final  97/77/7976 90:69 AM 6.10 (H) 0.50 - 0.99 mg/dL Final  90/70/7977 89:98 AM 7.36 (H) 0.50 - 0.99 mg/dL Final  98/85/7977 87:99 AM 4.26 (H) 0.50 - 1.10 mg/dL Final    Comment:    Verified by repeat analysis. .    Creatinine, Ser  Date/Time Value Ref Range Status  09/07/2024 11:25 AM 11.80 (H) 0.44 - 1.00 mg/dL Final  97/98/7973 97:72 AM 11.70 (H) 0.44 - 1.00 mg/dL Final  98/68/7973 90:77 AM 11.50 (H) 0.44 - 1.00 mg/dL Final  98/69/7973 89:61 PM 11.90 (H) 0.44 - 1.00 mg/dL Final  98/82/7975 93:49 PM 5.76 (H) 0.44 - 1.00 mg/dL Final  88/89/7976 97:90 PM 5.76 (H) 0.44 - 1.00 mg/dL Final  94/74/7976 98:97 AM 5.61 (H) 0.44 - 1.00 mg/dL Final  94/75/7976 97:82 PM 5.99 (H) 0.44 - 1.00  mg/dL Final  96/77/7976 94:91 AM 6.59 (H) 0.44 - 1.00 mg/dL Final  96/78/7976 87:99 PM 6.44 (H) 0.44 - 1.00 mg/dL Final  96/81/7976 88:50 PM 6.46 (H) 0.44 - 1.00 mg/dL Final  96/81/7976 93:76 PM 6.62 (H) 0.44 - 1.00 mg/dL Final  98/90/7976 94:50 AM 6.82 (H) 0.44 - 1.00 mg/dL Final  98/91/7976 94:99 AM 6.03 (H) 0.44 - 1.00 mg/dL Final  98/92/7976 91:67 PM 6.48 (H) 0.44 - 1.00 mg/dL Final  98/92/7976 98:49 PM 7.28 (H) 0.44 - 1.00 mg/dL Final  98/92/7976 87:88 PM 7.80 (H) 0.44 - 1.00 mg/dL Final  98/92/7976 88:85 AM 7.28 (H) 0.44 - 1.00 mg/dL Final  90/97/7977 90:83 PM 7.26 (H)  0.44 - 1.00 mg/dL Final  94/80/7977 90:88 AM 10.60 (H) 0.44 - 1.00 mg/dL Final  87/76/7978 90:83 AM 7.20 (H) 0.44 - 1.00 mg/dL Final  87/81/7978 93:92 PM 4.48 (H) 0.44 - 1.00 mg/dL Final  87/92/7978 96:45 AM 11.38 (H) 0.44 - 1.00 mg/dL Final  87/93/7978 94:92 AM 10.16 (H) 0.44 - 1.00 mg/dL Final  87/94/7978 98:51 AM 13.10 (H) 0.44 - 1.00 mg/dL Final  87/95/7978 96:82 AM 12.37 (H) 0.44 - 1.00 mg/dL Final  87/96/7978 96:78 AM 12.32 (H) 0.44 - 1.00 mg/dL Final  87/97/7978 95:60 AM 11.80 (H) 0.44 - 1.00 mg/dL Final  87/98/7978 87:59 AM 10.99 (H) 0.44 - 1.00 mg/dL Final  88/69/7978 91:44 AM 10.48 (H) 0.44 - 1.00 mg/dL Final  88/69/7978 95:73 AM 10.51 (H) 0.44 - 1.00 mg/dL Final  88/70/7978 97:66 AM 9.82 (H) 0.44 - 1.00 mg/dL Final  88/71/7978 95:97 AM 9.16 (H) 0.44 - 1.00 mg/dL Final  88/72/7978 96:93 AM 9.42 (H) 0.44 - 1.00 mg/dL Final  88/73/7978 96:88 PM 9.44 (H) 0.44 - 1.00 mg/dL Final  88/73/7978 91:91 AM 9.24 (H) 0.44 - 1.00 mg/dL Final  88/74/7978 88:66 AM 10.02 (H) 0.44 - 1.00 mg/dL Final  94/88/7978 91:51 AM 2.06 (H) 0.57 - 1.00 mg/dL Final  94/94/7978 90:45 AM 1.85 (H) 0.57 - 1.00 mg/dL Final   Recent Labs  Lab 09/04/24 2238 09/05/24 0921 09/05/24 0922 09/06/24 0227 09/07/24 1125  NA 140  --  141 141 143  K 5.7*  --  4.9 4.7 4.3  CL 107  --  109 109 109  CO2 14*  --  12* 12* 20*  GLUCOSE 86  --  74 120* 92  BUN 94*  --  93* 92* 87*  CREATININE 11.90*  --  11.50* 11.70* 11.80*  CALCIUM  9.4  --  9.4 9.1 9.3  PHOS  --  7.6*  --  7.7* 6.0*   Recent Labs  Lab 09/05/24 0921 09/06/24 0227 09/07/24 0231 09/08/24 0457 09/09/24 0506  WBC 6.3 4.8 7.0 4.9 5.8  NEUTROABS 4.1  --   --   --   --   HGB 9.1* 9.3* 8.9* 8.8* 8.8*  HCT 29.2* 30.0* 28.0* 27.0* 27.9*  MCV 100.0 101.4* 97.2 96.1 98.9  PLT 233 255 261 242 228   Liver Function Tests: Recent Labs  Lab 09/05/24 0921 09/06/24 0227 09/07/24 1125  AST 21  --   --   ALT 12  --   --   ALKPHOS 112  --   --   BILITOT  0.4  --   --   PROT 7.0  --   --   ALBUMIN  3.5 3.2* 3.5   No results for input(s): LIPASE, AMYLASE in the last 168 hours. No results for input(s): AMMONIA in the last 168 hours. Cardiac Enzymes:  No results for input(s): CKTOTAL, CKMB, CKMBINDEX, TROPONINI in the last 168 hours. Iron  Studies: No results for input(s): IRON , TIBC, TRANSFERRIN, FERRITIN in the last 72 hours. PT/INR: @LABRCNTIP (inr:5)  Xrays/Other Studies: ) Results for orders placed or performed during the hospital encounter of 09/04/24 (from the past 48 hours)  CBC     Status: Abnormal   Collection Time: 09/08/24  4:57 AM  Result Value Ref Range   WBC 4.9 4.0 - 10.5 K/uL   RBC 2.81 (L) 3.87 - 5.11 MIL/uL   Hemoglobin 8.8 (L) 12.0 - 15.0 g/dL   HCT 72.9 (L) 63.9 - 53.9 %   MCV 96.1 80.0 - 100.0 fL   MCH 31.3 26.0 - 34.0 pg   MCHC 32.6 30.0 - 36.0 g/dL   RDW 86.5 88.4 - 84.4 %   Platelets 242 150 - 400 K/uL   nRBC 0.0 0.0 - 0.2 %    Comment: Performed at [facility], [address], [place], [place] [address]  Lipid panel     Status: None   Collection Time: 09/08/24  4:57 AM  Result Value Ref Range   Cholesterol 173 0 - 200 mg/dL    Comment:        ATP III CLASSIFICATION:  <200     mg/dL   Desirable  799-760  mg/dL   Borderline High  >=759    mg/dL   High           Triglycerides 116 <150 mg/dL   HDL 67 >59 mg/dL   Total CHOL/HDL Ratio 2.6 RATIO   VLDL 23 0 - 40 mg/dL   LDL Cholesterol 83 0 - 99 mg/dL    Comment:        Total Cholesterol/HDL:CHD Risk Coronary Heart Disease Risk Table                     Men   Women  1/2 Average Risk   3.4   3.3  Average Risk       5.0   4.4  2 X Average Risk   9.6   7.1  3 X Average Risk  23.4   11.0        Use the calculated Patient Ratio above and the CHD Risk Table to determine the patient's CHD Risk.        ATP III CLASSIFICATION (LDL):  <100     mg/dL   Optimal  899-870  mg/dL   Near or Above                    Optimal  130-159   mg/dL   Borderline  839-810  mg/dL   High  >809     mg/dL   Very High Performed at [facility], [address], [place], [place] [address]   CBC     Status: Abnormal   Collection Time: 09/09/24  5:06 AM  Result Value Ref Range   WBC 5.8 4.0 - 10.5 K/uL   RBC 2.82 (L) 3.87 - 5.11 MIL/uL   Hemoglobin 8.8 (L) 12.0 - 15.0 g/dL   HCT 72.0 (L) 63.9 - 53.9 %   MCV 98.9 80.0 - 100.0 fL   MCH 31.2 26.0 - 34.0 pg   MCHC 31.5 30.0 - 36.0 g/dL   RDW 86.4 88.4 - 84.4 %   Platelets 228 150 - 400 K/uL   nRBC 0.0 0.0 - 0.2 %    Comment: Performed at [facility], [address], [place], [place] [address]   No results found.   PMH:   Past Medical History:  Diagnosis Date   Anemia associated with chronic renal failure    Atrophic kidney    bilateral   CIN I (cervical intraepithelial neoplasia I)    ESRD (end stage renal disease) on dialysis (HCC) 05/2020   hemodialysis  T/ Th/ Sat  @ [facility] in [place], ( 10-17-2020  s/p left AVG 11/ 2021 unable to use as of yet, currently using right IJ tunneled cath (right side of upper chest) for dialysis   FSGS (focal segmental glomerulosclerosis)    renal biopsy 04-15-2020 in epic , secondary to HTN/ HIV/ Obese   Heart palpitations 11/ 2021  (10-17-2020  pt stated the palpitations does not bother her and has no other symptoms , stated they started 11/ 2021)   pt was been seen by cardiology, dr s. emelie ([facility] in [place]),  per pt had event monitor for 17 days , was told normal with  no arrhythmias,  and stated echo is scheduled for 10-28-2020   History of vulvar dysplasia    HIV (human immunodeficiency virus infection) (HCC)    followed by ID-- dr a. wallace   Hypertension    S/P arteriovenous (AV) fistula creation 07-04-2020 @[facility]    left forearm,  07-28-2020  angioplasty of thrombosis   Secondary hyperparathyroidism of renal origin    Tachycardia    Thrombocytopenia 07/01/2020   Trigeminal neuralgia    ride side ---   neurologist--- dr onita   (10-17-2020  pt stated last episode about 4 months ago was mild)   VIN II (vulvar intraepithelial neoplasia II)     PSH:   Past Surgical History:  Procedure Laterality Date   A/V FISTULAGRAM Left 07/28/2020   Procedure: A/V FISTULAGRAM;  Surgeon: Gretta Lonni PARAS, MD;  Location: [facility] INVASIVE CV LAB;  Service: Cardiovascular;  Laterality: Left;   AV FISTULA PLACEMENT Left 07/04/2020   Procedure: INSERTION OF LEFT ARTERIOVENOUS (AV) GORE-TEX GRAFT ARM (ACUSEAL);  Surgeon: Magda Debby SAILOR, MD;  Location: [facility] OR;  Service: Vascular;  Laterality: Left;   CERVICAL BIOPSY  W/ LOOP ELECTRODE EXCISION  2005   CO2 LASER APPLICATION N/A 12/22/2020   Procedure: CO2 LASER APPLICATION;  Surgeon: Eloy Herring, MD;  Location: [facility];  Service: Gynecology;  Laterality: N/A;   INSERTION OF DIALYSIS CATHETER N/A 08/14/2021   Procedure: TUNNELED DIALYSIS CATHETER EXCHANGE;  Surgeon: Eliza Lonni RAMAN, MD;  Location: [facility] OR;  Service: Vascular;  Laterality: N/A;   IR TUNNELED CENTRAL VENOUS CATH PLC W IMG  09/07/2024   ORIF ANKLE FRACTURE Right 2012   per pt retained hardware   PERIPHERAL VASCULAR BALLOON ANGIOPLASTY Left 07/28/2020   Procedure: PERIPHERAL VASCULAR BALLOON ANGIOPLASTY;  Surgeon: Gretta Lonni PARAS, MD;  Location: [facility];  Service: Cardiovascular;  Laterality: Left;  AVF   VULVA SURGERY  2013   excision    Allergies: Allergies[1]  Medications:   Prior to Admission medications  Medication Sig Start Date End Date Taking? Authorizing Provider  albuterol  (VENTOLIN  HFA) 108 (90 Base) MCG/ACT inhaler Inhale 1-2 puffs into the lungs every 6 (six) hours as needed for wheezing or shortness of breath. 06/15/22  Yes Logan Ubaldo NOVAK, PA-C  amLODipine  (NORVASC ) 5 MG tablet Take 5 mg by mouth daily.   Yes [provider]  bictegravir-emtricitabine -tenofovir  AF (BIKTARVY ) 50-200-25 MG TABS tablet Take 1 tablet by mouth daily. 06/01/24  05/27/25 Yes Dennise Kingsley, MD  calcitRIOL (ROCALTROL) 0.5 MCG capsule Take 0.5 mcg by mouth daily.   Yes [provider]  sodium bicarbonate  650 MG tablet Take 1 tablet (650 mg total) by mouth 2 (two) times daily. 12/28/21  Yes Sebastian Toribio GAILS, MD  triamcinolone  ointment (KENALOG ) 0.1 % Apply 1 Application topically at bedtime as needed. 08/20/24  Yes [provider]  amLODipine  (NORVASC ) 10 MG tablet Take 1 tablet (10 mg total) by mouth daily. Patient not taking: Reported on 09/05/2024 12/28/21 09/05/24  Sebastian Toribio GAILS, MD    Discontinued Meds:   Medications Discontinued During This Encounter  Medication Reason   aspirin  tablet 325 mg    sodium bicarbonate  tablet 650 mg    sodium bicarbonate  tablet 650 mg    bictegravir-emtricitabine -tenofovir  AF (BIKTARVY ) 50-200-25 MG per tablet 1 tablet    dolutegravir -lamiVUDine  (DOVATO ) 50-300 MG per tablet 1 tablet    budesonide -formoterol  (SYMBICORT ) 80-4.5 MCG/ACT inhaler Patient Preference   doxycycline  (VIBRAMYCIN ) 100 MG capsule Completed Course   ondansetron  (ZOFRAN -ODT) 4 MG disintegrating tablet Completed Course   predniSONE  (DELTASONE ) 20 MG tablet Completed Course   ergocalciferol  (VITAMIN D2) (DRISDOL ) 8000 units/mL (200 mcg/mL) drops 8,000 Units    heparin  ADULT infusion 100 units/mL (25000 units/250mL)    pentafluoroprop-tetrafluoroeth (GEBAUERS) aerosol 1 Application Patient Transfer   lidocaine  (PF) (XYLOCAINE ) 1 % injection 5 mL Patient Transfer   lidocaine -prilocaine  (EMLA ) cream 1 Application Patient Transfer   heparin  injection 1,000 Units Patient Transfer   anticoagulant sodium citrate  solution 5 mL Patient Transfer   alteplase  (CATHFLO ACTIVASE ) injection 2 mg Patient Transfer   sodium bicarbonate  tablet 1,300 mg     Social History:  reports that she has never smoked. She has never used smokeless tobacco. She reports that she does not currently use alcohol. She reports that she does not currently use  drugs.  Family History:   Family History  Problem Relation Age of Onset   Diabetes Mother    Healthy Father    Breast cancer Maternal Aunt    Pancreatic cancer Maternal Grandfather    Colon cancer Neg Hx    Colon polyps Neg Hx    Esophageal cancer Neg Hx    Rectal cancer Neg Hx    Stomach cancer Neg Hx    Prostate cancer Neg Hx    Endometrial cancer Neg Hx    Ovarian cancer Neg Hx     Blood pressure (!) 149/95, pulse 90, temperature 98 F (36.7 C), temperature source Oral, resp. rate 17, height 5' 2 (1.575 m), weight 77.2 kg, last menstrual period 04/02/2015, SpO2 99%. Physical Exam: GEN: wdwn, sitting in bed, nad ENT: no nasal discharge, mmm EYES: no scleral icterus, eomi CV: normal rate, no rub PULM: no iwob, bilateral chest rise ABD: NABS, non-distended SKIN: no rashes or jaundice EXT: no edema, warm and well perfused      Sherra Player, MD 09/09/2024, 12:27 PM      [1]  Allergies Allergen Reactions   Lioresal  [Baclofen ] Other (See Comments)    Intolerable dizziness   Neurontin  [Gabapentin ] Other (See Comments)    Intolerable dizziness   Penicillins Other (See Comments)    Childhood allergy Unknown reaction

## 2024-09-10 LAB — BASIC METABOLIC PANEL WITH GFR
Anion gap: 10 (ref 5–15)
BUN: 44 mg/dL — ABNORMAL HIGH (ref 6–20)
CO2: 28 mmol/L (ref 22–32)
Calcium: 10 mg/dL (ref 8.9–10.3)
Chloride: 100 mmol/L (ref 98–111)
Creatinine, Ser: 6.07 mg/dL — ABNORMAL HIGH (ref 0.44–1.00)
GFR, Estimated: 8 mL/min — ABNORMAL LOW
Glucose, Bld: 88 mg/dL (ref 70–99)
Potassium: 4.6 mmol/L (ref 3.5–5.1)
Sodium: 138 mmol/L (ref 135–145)

## 2024-09-10 LAB — CBC
HCT: 30 % — ABNORMAL LOW (ref 36.0–46.0)
Hemoglobin: 9.5 g/dL — ABNORMAL LOW (ref 12.0–15.0)
MCH: 31.5 pg (ref 26.0–34.0)
MCHC: 31.7 g/dL (ref 30.0–36.0)
MCV: 99.3 fL (ref 80.0–100.0)
Platelets: 233 10*3/uL (ref 150–400)
RBC: 3.02 MIL/uL — ABNORMAL LOW (ref 3.87–5.11)
RDW: 13.4 % (ref 11.5–15.5)
WBC: 6.8 10*3/uL (ref 4.0–10.5)
nRBC: 0 % (ref 0.0–0.2)

## 2024-09-10 NOTE — Progress Notes (Signed)
 Brooke Baldwin is an 49 y.o. female PMH HIV, FSGS, trigeminal neuralgia, uterine fibroids, hypertension, and medical non compliance.  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 HD in December of 2021 but was non compliant with dialysis, sometimes missing >1 month of treatment. Ultimately had wanted to do PD, but when she went to have PD cath placed in Sept 2022 was noted to have large pelvic mass and surgery was aborted (uterine fibroid).    Her TDC was removed in March 2023 due to possible infection and she left the hospital prior to having a new one placed had been without dialysis since that time.   07/14/24 vitamin D  is 8.8, creatinine 12.9, potassium 5.3, hemoglobin 10.1, adequate iron  stores, PTH 1028. Out of antihypertensives and never called the clinic for another rx to be called in; her PCP is in [place] and she keeps traveling back for that. Also has stress from her job with [employer] now with SOB, CP.   Assessment/Plan: CKD5 - She has been followed at [facility] q6-8weeks for many years, resistant to permanent access after  failed attempt x1 (lt FAL AVG) despite multiple attempts to encourage another attempt. She is interested in PD but has canceled appts many times because her mother not being able to come.  Patient trying to get back in with Dr. Shawna.  Because it has been over a year since she had home visit will need to schedule this again. - Now with progressive disease willing to start hemodialysis -Status post TDC with IR on 2/2.  Appreciate help - Tolerated dialysis x 2.  Next dialysis would be Friday.  If clip is arranged she could leave today and receive outpatient dialysis tomorrow.  Chest pain with elevated troponin: Evaluated by cardiology.  Seems improved. Hypertension -continue current blood pressure medications.  Ultrafiltration with dialysis as tolerated Secondary hyperparathyroidism: Prescribed  calcitriol in the past.  PTH 330.  No calcitriol needed.  Continue home sevelamer  Anemia of CKD: iron  replete. Cont esa H/o large pedunculated peripherally calcified fibroid measuring at least 9.1 x 6.4 x 14.5 cm.s/p hysterectomy/salpingectomy + vulvar excision on 10/15/22 in [place]. Plans for outpatient PD eval with Teparra. HIV now back on meds   Subjective: Patient feels well with no complaints.  Said dialysis went well yesterday with no issues.  Waiting on clip   Chemistry and CBC: Creat  Date/Time Value Ref Range Status  11/12/2023 10:07 AM 8.68 (H) 0.50 - 0.99 mg/dL Final    Comment:    Verified by repeat analysis. [name]   04/12/2022 09:39 AM 5.29 (H) 0.50 - 0.99 mg/dL Final  97/77/7976 90:69 AM 6.10 (H) 0.50 - 0.99 mg/dL Final  90/70/7977 89:98 AM 7.36 (H) 0.50 - 0.99 mg/dL Final  98/85/7977 87:99 AM 4.26 (H) 0.50 - 1.10 mg/dL Final    Comment:    Verified by repeat analysis. .    Creatinine, Ser  Date/Time Value Ref Range Status  09/10/2024 04:43 AM 6.07 (H) 0.44 - 1.00 mg/dL Final  97/95/7973 92:83 PM 5.49 (H) 0.44 - 1.00 mg/dL Final  97/97/7973 88:74 AM 11.80 (H) 0.44 - 1.00 mg/dL Final  97/98/7973 97:72 AM 11.70 (H) 0.44 - 1.00 mg/dL Final  98/68/7973 90:77 AM 11.50 (H) 0.44 - 1.00 mg/dL Final  98/69/7973 89:61 PM 11.90 (H) 0.44 - 1.00 mg/dL Final  98/82/7975 93:49 PM 5.76 (H) 0.44 - 1.00 mg/dL Final  88/89/7976 97:90 PM 5.76 (  H) 0.44 - 1.00 mg/dL Final  94/74/7976 98:97 AM 5.61 (H) 0.44 - 1.00 mg/dL Final  94/75/7976 97:82 PM 5.99 (H) 0.44 - 1.00 mg/dL Final  96/77/7976 94:91 AM 6.59 (H) 0.44 - 1.00 mg/dL Final  96/78/7976 87:99 PM 6.44 (H) 0.44 - 1.00 mg/dL Final  96/81/7976 88:50 PM 6.46 (H) 0.44 - 1.00 mg/dL Final  96/81/7976 93:76 PM 6.62 (H) 0.44 - 1.00 mg/dL Final  98/90/7976 94:50 AM 6.82 (H) 0.44 - 1.00 mg/dL Final  98/91/7976 94:99 AM 6.03 (H) 0.44 - 1.00 mg/dL Final  98/92/7976 91:67 PM 6.48 (H) 0.44 - 1.00 mg/dL Final  98/92/7976 98:49 PM 7.28 (H) 0.44 - 1.00  mg/dL Final  98/92/7976 87:88 PM 7.80 (H) 0.44 - 1.00 mg/dL Final  98/92/7976 88:85 AM 7.28 (H) 0.44 - 1.00 mg/dL Final  90/97/7977 90:83 PM 7.26 (H) 0.44 - 1.00 mg/dL Final  94/80/7977 90:88 AM 10.60 (H) 0.44 - 1.00 mg/dL Final  87/76/7978 90:83 AM 7.20 (H) 0.44 - 1.00 mg/dL Final  87/81/7978 93:92 PM 4.48 (H) 0.44 - 1.00 mg/dL Final  87/92/7978 96:45 AM 11.38 (H) 0.44 - 1.00 mg/dL Final  87/93/7978 94:92 AM 10.16 (H) 0.44 - 1.00 mg/dL Final  87/94/7978 98:51 AM 13.10 (H) 0.44 - 1.00 mg/dL Final  87/95/7978 96:82 AM 12.37 (H) 0.44 - 1.00 mg/dL Final  87/96/7978 96:78 AM 12.32 (H) 0.44 - 1.00 mg/dL Final  87/97/7978 95:60 AM 11.80 (H) 0.44 - 1.00 mg/dL Final  87/98/7978 87:59 AM 10.99 (H) 0.44 - 1.00 mg/dL Final  88/69/7978 91:44 AM 10.48 (H) 0.44 - 1.00 mg/dL Final  88/69/7978 95:73 AM 10.51 (H) 0.44 - 1.00 mg/dL Final  88/70/7978 97:66 AM 9.82 (H) 0.44 - 1.00 mg/dL Final  88/71/7978 95:97 AM 9.16 (H) 0.44 - 1.00 mg/dL Final  88/72/7978 96:93 AM 9.42 (H) 0.44 - 1.00 mg/dL Final  88/73/7978 96:88 PM 9.44 (H) 0.44 - 1.00 mg/dL Final  88/73/7978 91:91 AM 9.24 (H) 0.44 - 1.00 mg/dL Final  88/74/7978 88:66 AM 10.02 (H) 0.44 - 1.00 mg/dL Final  94/88/7978 91:51 AM 2.06 (H) 0.57 - 1.00 mg/dL Final  94/94/7978 90:45 AM 1.85 (H) 0.57 - 1.00 mg/dL Final   Recent Labs  Lab 09/04/24 2238 09/05/24 0921 09/05/24 0922 09/06/24 0227 09/07/24 1125 09/09/24 1916 09/10/24 0443  NA 140  --  141 141 143 135 138  K 5.7*  --  4.9 4.7 4.3 4.0 4.6  CL 107  --  109 109 109 97* 100  CO2 14*  --  12* 12* 20* 29 28  GLUCOSE 86  --  74 120* 92 158* 88  BUN 94*  --  93* 92* 87* 34* 44*  CREATININE 11.90*  --  11.50* 11.70* 11.80* 5.49* 6.07*  CALCIUM  9.4  --  9.4 9.1 9.3 9.6 10.0  PHOS  --  7.6*  --  7.7* 6.0* 2.3*  --    Recent Labs  Lab 09/05/24 0921 09/06/24 0227 09/07/24 0231 09/08/24 0457 09/09/24 0506 09/10/24 0443  WBC 6.3   < > 7.0 4.9 5.8 6.8  NEUTROABS 4.1  --   --   --   --   --    HGB 9.1*   < > 8.9* 8.8* 8.8* 9.5*  HCT 29.2*   < > 28.0* 27.0* 27.9* 30.0*  MCV 100.0   < > 97.2 96.1 98.9 99.3  PLT 233   < > 261 242 228 233   < > = values in this interval not displayed.   Liver Function  Tests: Recent Labs  Lab 09/05/24 9078 09/06/24 0227 09/07/24 1125 09/09/24 1916  AST 21  --   --   --   ALT 12  --   --   --   ALKPHOS 112  --   --   --   BILITOT 0.4  --   --   --   PROT 7.0  --   --   --   ALBUMIN  3.5 3.2* 3.5 3.5   No results for input(s): LIPASE, AMYLASE in the last 168 hours. No results for input(s): AMMONIA in the last 168 hours. Cardiac Enzymes: No results for input(s): CKTOTAL, CKMB, CKMBINDEX, TROPONINI in the last 168 hours. Iron  Studies: No results for input(s): IRON , TIBC, TRANSFERRIN, FERRITIN in the last 72 hours. PT/INR: @LABRCNTIP (inr:5)  Xrays/Other Studies: ) Results for orders placed or performed during the hospital encounter of 09/04/24 (from the past 48 hours)  CBC     Status: Abnormal   Collection Time: 09/09/24  5:06 AM  Result Value Ref Range   WBC 5.8 4.0 - 10.5 K/uL   RBC 2.82 (L) 3.87 - 5.11 MIL/uL   Hemoglobin 8.8 (L) 12.0 - 15.0 g/dL   HCT 72.0 (L) 63.9 - 53.9 %   MCV 98.9 80.0 - 100.0 fL   MCH 31.2 26.0 - 34.0 pg   MCHC 31.5 30.0 - 36.0 g/dL   RDW 86.4 88.4 - 84.4 %   Platelets 228 150 - 400 K/uL   nRBC 0.0 0.0 - 0.2 %    Comment: Performed at [facility], [address] [place], [place] [address]  Renal function panel     Status: Abnormal   Collection Time: 09/09/24  7:16 PM  Result Value Ref Range   Sodium 135 135 - 145 mmol/L   Potassium 4.0 3.5 - 5.1 mmol/L   Chloride 97 (L) 98 - 111 mmol/L   CO2 29 22 - 32 mmol/L   Glucose, Bld 158 (H) 70 - 99 mg/dL    Comment: Glucose reference range applies only to samples taken after fasting for at least 8 hours.   BUN 34 (H) 6 - 20 mg/dL   Creatinine, Ser 4.50 (H) 0.44 - 1.00 mg/dL   Calcium  9.6 8.9 - 10.3 mg/dL   Phosphorus 2.3 (L) 2.5  - 4.6 mg/dL   Albumin  3.5 3.5 - 5.0 g/dL   GFR, Estimated 9 (L) >60 mL/min    Comment: (NOTE) Calculated using the CKD-EPI Creatinine Equation (2021)    Anion gap 9 5 - 15    Comment: Performed at [facility], [address] [place], [place] [address]  CBC     Status: Abnormal   Collection Time: 09/10/24  4:43 AM  Result Value Ref Range   WBC 6.8 4.0 - 10.5 K/uL   RBC 3.02 (L) 3.87 - 5.11 MIL/uL   Hemoglobin 9.5 (L) 12.0 - 15.0 g/dL   HCT 69.9 (L) 63.9 - 53.9 %   MCV 99.3 80.0 - 100.0 fL   MCH 31.5 26.0 - 34.0 pg   MCHC 31.7 30.0 - 36.0 g/dL   RDW 86.5 88.4 - 84.4 %   Platelets 233 150 - 400 K/uL   nRBC 0.0 0.0 - 0.2 %    Comment: Performed at [facility], [address] [place], [place] [address]  Basic metabolic panel     Status: Abnormal   Collection Time: 09/10/24  4:43 AM  Result Value Ref Range   Sodium 138  135 - 145 mmol/L   Potassium 4.6 3.5 - 5.1 mmol/L   Chloride 100 98 - 111 mmol/L   CO2 28 22 - 32 mmol/L   Glucose, Bld 88 70 - 99 mg/dL    Comment: Glucose reference range applies only to samples taken after fasting for at least 8 hours.   BUN 44 (H) 6 - 20 mg/dL   Creatinine, Ser 3.92 (H) 0.44 - 1.00 mg/dL   Calcium  10.0 8.9 - 10.3 mg/dL   GFR, Estimated 8 (L) >60 mL/min    Comment: (NOTE) Calculated using the CKD-EPI Creatinine Equation (2021)    Anion gap 10 5 - 15    Comment: Performed at [facility], [address] [place], [place] [address]   No results found.   PMH:   Past Medical History:  Diagnosis Date   Anemia associated with chronic renal failure    Atrophic kidney    bilateral   CIN I (cervical intraepithelial neoplasia I)    ESRD (end stage renal disease) on dialysis (HCC) 05/2020   hemodialysis  T/ Th/ Sat  @ [facility] ([address] in [place], ( 10-17-2020  s/p left AVG 11/ 2021 unable to use as of yet, currently using right IJ tunneled cath (right side of upper chest) for dialysis   FSGS (focal segmental  glomerulosclerosis)    renal biopsy 04-15-2020 in epic , secondary to HTN/ HIV/ Obese   Heart palpitations 11/ 2021  (10-17-2020  pt stated the palpitations does not bother her and has no other symptoms , stated they started 11/ 2021)   pt was been seen by cardiology, dr s. emelie ([facility] in [place]),  per pt had event monitor for 17 days , was told normal with  no arrhythmias,  and stated echo is scheduled for 10-28-2020   History of vulvar dysplasia    HIV (human immunodeficiency virus infection) (HCC)    followed by ID-- dr a. wallace   Hypertension    S/P arteriovenous (AV) fistula creation 07-04-2020 @[facility]    left forearm,  07-28-2020  angioplasty of thrombosis   Secondary hyperparathyroidism of renal origin    Tachycardia    Thrombocytopenia 07/01/2020   Trigeminal neuralgia    ride side ---  neurologist--- dr onita   (10-17-2020  pt stated last episode about 4 months ago was mild)   VIN II (vulvar intraepithelial neoplasia II)     PSH:   Past Surgical History:  Procedure Laterality Date   A/V FISTULAGRAM Left 07/28/2020   Procedure: A/V FISTULAGRAM;  Surgeon: Gretta Lonni PARAS, MD;  Location: [facility] INVASIVE CV LAB;  Service: Cardiovascular;  Laterality: Left;   AV FISTULA PLACEMENT Left 07/04/2020   Procedure: INSERTION OF LEFT ARTERIOVENOUS (AV) GORE-TEX GRAFT ARM (ACUSEAL);  Surgeon: Magda Debby SAILOR, MD;  Location: [facility] OR;  Service: Vascular;  Laterality: Left;   CERVICAL BIOPSY  W/ LOOP ELECTRODE EXCISION  2005   CO2 LASER APPLICATION N/A 12/22/2020   Procedure: CO2 LASER APPLICATION;  Surgeon: Eloy Herring, MD;  Location: [facility];  Service: Gynecology;  Laterality: N/A;   INSERTION OF DIALYSIS CATHETER N/A 08/14/2021   Procedure: TUNNELED DIALYSIS CATHETER EXCHANGE;  Surgeon: Eliza Lonni RAMAN, MD;  Location: [facility] OR;  Service: Vascular;  Laterality: N/A;   IR TUNNELED CENTRAL VENOUS CATH PLC W IMG  09/07/2024   ORIF ANKLE FRACTURE Right 2012   per pt  retained hardware   PERIPHERAL VASCULAR BALLOON ANGIOPLASTY Left 07/28/2020   Procedure: PERIPHERAL VASCULAR  BALLOON ANGIOPLASTY;  Surgeon: Gretta Lonni PARAS, MD;  Location: [facility] INVASIVE CV LAB;  Service: Cardiovascular;  Laterality: Left;  AVF   VULVA SURGERY  2013   excision    Allergies: Allergies[1]  Medications:   Prior to Admission medications  Medication Sig Start Date End Date Taking? Authorizing Provider  albuterol  (VENTOLIN  HFA) 108 (90 Base) MCG/ACT inhaler Inhale 1-2 puffs into the lungs every 6 (six) hours as needed for wheezing or shortness of breath. 06/15/22  Yes Logan Ubaldo NOVAK, PA-C  amLODipine  (NORVASC ) 5 MG tablet Take 5 mg by mouth daily.   Yes [provider]  bictegravir-emtricitabine -tenofovir  AF (BIKTARVY ) 50-200-25 MG TABS tablet Take 1 tablet by mouth daily. 06/01/24 05/27/25 Yes Dennise Kingsley, MD  calcitRIOL (ROCALTROL) 0.5 MCG capsule Take 0.5 mcg by mouth daily.   Yes [provider]  sodium bicarbonate  650 MG tablet Take 1 tablet (650 mg total) by mouth 2 (two) times daily. 12/28/21  Yes Sebastian Toribio GAILS, MD  triamcinolone  ointment (KENALOG ) 0.1 % Apply 1 Application topically at bedtime as needed. 08/20/24  Yes [provider]  amLODipine  (NORVASC ) 10 MG tablet Take 1 tablet (10 mg total) by mouth daily. Patient not taking: Reported on 09/05/2024 12/28/21 09/05/24  Sebastian Toribio GAILS, MD    Discontinued Meds:   Medications Discontinued During This Encounter  Medication Reason   aspirin  tablet 325 mg    sodium bicarbonate  tablet 650 mg    sodium bicarbonate  tablet 650 mg    bictegravir-emtricitabine -tenofovir  AF (BIKTARVY ) 50-200-25 MG per tablet 1 tablet    dolutegravir -lamiVUDine  (DOVATO ) 50-300 MG per tablet 1 tablet    budesonide -formoterol  (SYMBICORT ) 80-4.5 MCG/ACT inhaler Patient Preference   doxycycline  (VIBRAMYCIN ) 100 MG capsule Completed Course   ondansetron  (ZOFRAN -ODT) 4 MG disintegrating tablet Completed Course    predniSONE  (DELTASONE ) 20 MG tablet Completed Course   ergocalciferol  (VITAMIN D2) (DRISDOL ) 8000 units/mL (200 mcg/mL) drops 8,000 Units    heparin  ADULT infusion 100 units/mL (25000 units/250mL)    pentafluoroprop-tetrafluoroeth (GEBAUERS) aerosol 1 Application Patient Transfer   lidocaine  (PF) (XYLOCAINE ) 1 % injection 5 mL Patient Transfer   lidocaine -prilocaine  (EMLA ) cream 1 Application Patient Transfer   heparin  injection 1,000 Units Patient Transfer   anticoagulant sodium citrate  solution 5 mL Patient Transfer   alteplase  (CATHFLO ACTIVASE ) injection 2 mg Patient Transfer   sodium bicarbonate  tablet 1,300 mg     Social History:  reports that she has never smoked. She has never used smokeless tobacco. She reports that she does not currently use alcohol. She reports that she does not currently use drugs.  Family History:   Family History  Problem Relation Age of Onset   Diabetes Mother    Healthy Father    Breast cancer Maternal Aunt    Pancreatic cancer Maternal Grandfather    Colon cancer Neg Hx    Colon polyps Neg Hx    Esophageal cancer Neg Hx    Rectal cancer Neg Hx    Stomach cancer Neg Hx    Prostate cancer Neg Hx    Endometrial cancer Neg Hx    Ovarian cancer Neg Hx     Blood pressure (!) 144/94, pulse 94, temperature 98.2 F (36.8 C), temperature source Oral, resp. rate 18, height 5' 2 (1.575 m), weight 76.3 kg, last menstrual period 04/02/2015, SpO2 98%. Physical Exam: GEN: wdwn, sitting in bed, nad ENT: no nasal discharge, mmm EYES: no scleral icterus, eomi CV: normal rate, no rub PULM: no iwob, bilateral chest rise ABD: NABS,  non-distended SKIN: no rashes or jaundice EXT: no edema, warm and well perfused      Sherra Player, MD 09/10/2024, 11:05 AM      [1]  Allergies Allergen Reactions   Lioresal  [Baclofen ] Other (See Comments)    Intolerable dizziness   Neurontin  [Gabapentin ] Other (See Comments)    Intolerable dizziness    Penicillins Other (See Comments)    Childhood allergy Unknown reaction

## 2024-09-10 NOTE — Plan of Care (Signed)

## 2024-09-10 NOTE — Progress Notes (Signed)
 " Progress Note   Patient: Brooke Baldwin FMW:[id number] DOB: 1975/09/28 DOA: 09/04/2024     5 DOS: the patient was seen and examined on 09/10/2024     Brief hospital course: Harmoney Sienkiewicz is an 49 y.o. 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  In the ED blood pressure was 183/114, with a potassium of 5.7 CO2 of 14 creatinine of 12 cardiac biomarkers in the 1400s, chest x-ray showed small left-sided pleural effusion, he was given Lokelma  aspirin  and started on IV heparin  drip.    Assessment and Plan:    Elevated troponin/Atypical chest pain: Twelve-lead EKG showed normal sinus rhythm no ST segment abnormalities, cardiac biomarkers troponins of 1500. Cardiology thinks is more pleurisy, very unlikely an acute coronary syndrome. Heparin  was discontinued. 2D echo showed no effusion Continue current conservative treatment, no indication for ischemic workup.   Hypertensive urgency: She says she ran out of her blood pressure medications about 3 weeks. Coreg  and amlodipine  blood pressures improved This could have been contributing to her chest pain.   Chronic kidney disease stage V/metabolic acidosis with increased anion gap and accumulation of organic acids: Been followed by [facility] for about 2 months she has been resistant to permanent accessed. Has been noncompliant with her follow-ups and appointment with nephrology as an outpatient. She agreed to temporary tunneled catheter to be placed on 09/07/2024. Nephrology recommended to start dialysis on 2-3 -2026 Case manager working on outpatient dialysis placement   Hyperkalemia: Resolved with oral Lokelma  and hemodialysis.   Hyperphosphatemia: Continue phosphate binders, phosphorus is coming down. Further management per  nephrology.   Hypertensive urgency: Continue Coreg , amlodipine  and IV hydralazine  as needed.   Anemia of chronic renal disease: Her hemoglobin this morning is 9.3 continue to monitor.   Vitamin D  deficiency: Started on replacement.   HIV (human immunodeficiency virus infection) (HCC) Resume her home medications, the patient refused switching to Dovato  she was continue Biktarvy  despite knowing the risk because of her renal dysfunction     DVT prophylaxis: Lovenox Family Communication:none Status is: Inpatient Remains inpatient appropriate because: chest pain       Subjective:  Seen and examined at bedside this morning Currently pending placement   Physical Exam:   General exam: In no acute distress. Respiratory system: Good air movement and clear to auscultation. Cardiovascular system: S1 & S2 heard, RRR. No JVD. Gastrointestinal system: Abdomen is nondistended, soft and nontender.  Extremities: No pedal edema. Skin: No rashes, lesions or ulcers Psychiatry: Judgement and insight appear normal. Mood & affect appropriate.   Data Reviewed:    Latest Ref Rng & Units 09/10/2024    4:43 AM 09/09/2024    5:06 AM 09/08/2024    4:57 AM  CBC  WBC 4.0 - 10.5 K/uL 6.8  5.8  4.9   Hemoglobin 12.0 - 15.0 g/dL 9.5  8.8  8.8   Hematocrit 36.0 - 46.0 % 30.0  27.9  27.0   Platelets 150 - 400 K/uL 233  228  242        Latest Ref Rng & Units 09/10/2024    4:43 AM 09/09/2024    7:16 PM 09/07/2024   11:25 AM  BMP  Glucose 70 - 99 mg/dL 88  841  92   BUN 6 - 20 mg/dL 44  34  87  Creatinine 0.44 - 1.00 mg/dL 3.92  4.50  88.19   Sodium 135 - 145 mmol/L 138  135  143   Potassium 3.5 - 5.1 mmol/L 4.6  4.0  4.3   Chloride 98 - 111 mmol/L 100  97  109   CO2 22 - 32 mmol/L 28  29  20    Calcium  8.9 - 10.3 mg/dL 89.9  9.6  9.3       Vitals:   09/10/24 0531 09/10/24 0758 09/10/24 0812 09/10/24 1202  BP: (!) 126/90  (!) 144/94 (!) 149/85  Pulse: 96 85 94 99  Resp: 20 19 18 17   Temp: 98 F  (36.7 C)  98.2 F (36.8 C) 98.2 F (36.8 C)  TempSrc: Oral  Oral Oral  SpO2: 99% 99% 98% 100%  Weight: 76.3 kg     Height:         Author: Drue ONEIDA Potter, MD 09/10/2024 5:27 PM  For on call review [email or web address].  "

## 2024-09-10 NOTE — Progress Notes (Addendum)
 Contacted [facility] admissions to request an update on pt's referral. Awaiting response. Will assist as needed.   Randine Mungo Dialysis Navigator [phone]  Addendum at 3:40 pm: Awaiting confirmation of pt's schedule and start date from local [facility] clinic. Will update team once info is received.

## 2024-09-10 NOTE — Plan of Care (Signed)
  Problem: Health Behavior/Discharge Planning: Goal: Ability to manage health-related needs will improve Outcome: Progressing   Problem: Clinical Measurements: Goal: Respiratory complications will improve Outcome: Progressing Goal: Cardiovascular complication will be avoided Outcome: Progressing   Problem: Activity: Goal: Risk for activity intolerance will decrease Outcome: Progressing   Problem: Nutrition: Goal: Adequate nutrition will be maintained Outcome: Progressing   Problem: Safety: Goal: Ability to remain free from injury will improve Outcome: Progressing

## 2024-09-10 NOTE — TOC Initial Note (Addendum)
 Transition of Care [facility]) - Initial/Assessment Note    Patient Details  Name: Brooke Baldwin MRN: [id number] Date of Birth: February 27, 1976  Transition of Care [facility]) CM/SW Contact:    Sudie Erminio Deems, RN Phone Number: 09/10/2024, 3:39 PM  Clinical Narrative:  Patient presented for chest pain-CKD stage V. Per notes, patient has tolerated HDx 2 sessions. Renal Navigator is following for outpatient HD- Await Clip. Patient states she has insurance and PCP- CMA to schedule PCP appointment. Information will be on the AVS. ICM will continue to follow for additional needs as the patient progresses.    2-5  1615 CMA attempted to make a PCP appointment- [place] is in [place] and patient states she will call to schedule since she goes back and forth. No further needs identified at this time.  Expected Discharge Plan: Home/Self Care Barriers to Discharge: Continued Medical Work up   Patient Goals and CMS Choice Patient states their goals for this hospitalization and ongoing recovery are:: Plan for home once she is clipped for outpatient HD.  Expected Discharge Plan and Services In-house Referral: NA Discharge Planning Services: CM Consult Post Acute Care Choice: NA Living arrangements for the past 2 months: Single Family Home                   DME Agency: NA       HH Arranged: NA   Prior Living Arrangements/Services Living arrangements for the past 2 months: Single Family Home Lives with:: Significant Other (Mother will be here to visit this weekend.) Patient language and need for interpreter reviewed:: Yes Do you feel safe going back to the place where you live?: Yes      Need for Family Participation in Patient Care: No (Comment) Care giver support system in place?: No (comment)   Criminal Activity/Legal Involvement Pertinent to Current Situation/Hospitalization: No - Comment as needed  Activities of Daily Living   ADL Screening (condition at time of  admission) Independently performs ADLs?: Yes (appropriate for developmental age) Is the patient deaf or have difficulty hearing?: No Does the patient have difficulty seeing, even when wearing glasses/contacts?: No Does the patient have difficulty concentrating, remembering, or making decisions?: No  Permission Sought/Granted Permission sought to share information with : Family Supports, Case Manager                Emotional Assessment Appearance:: Appears stated age Attitude/Demeanor/Rapport: Engaged Affect (typically observed): Appropriate Orientation: : Oriented to Self, Oriented to Place Alcohol / Substance Use: Not Applicable Psych Involvement: No (comment)  Admission diagnosis:  Hyperkalemia [E87.5] ESRD (end stage renal disease) (HCC) [N18.6] Elevated troponin [R79.89] NSTEMI (non-ST elevated myocardial infarction) (HCC) [I21.4] Chest pain, unspecified type [R07.9] Patient Active Problem List   Diagnosis Date Noted   Hypertensive emergency 09/07/2024   Demand ischemia (HCC) 09/07/2024   Pure hypercholesterolemia 09/07/2024   NSTEMI (non-ST elevated myocardial infarction) (HCC) 09/05/2024   Chronic kidney disease, stage V (HCC) 09/05/2024   Hypertensive urgency 09/05/2024   Anemia of chronic disease 09/05/2024   Pain in right knee 08/13/2024   Routine screening for STI (sexually transmitted infection) 05/01/2023   Uterine fibroid 12/27/2021   History of difficult venous access 12/27/2021   Immunosuppressed status    Hyperkalemia 08/12/2021   Adnexal mass 05/31/2021   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   Class 1 obesity due to excess calories with serious comorbidity and body mass index (BMI) of 31.0 to 31.9 in adult 09/08/2020   Hypertension 09/08/2020  Need for pneumocystis prophylaxis 07/27/2020   Vaccine counseling 07/27/2020   Metabolic acidosis with increased anion gap and accumulation of organic acids 07/01/2020   Hypoalbuminemia  07/01/2020   Secondary hyperparathyroidism 07/01/2020   HIV (human immunodeficiency virus infection) (HCC) 06/30/2020   End stage renal disease (HCC) 06/30/2020   Elevated blood pressure reading without diagnosis of hypertension 06/30/2020   Right trigeminal neuralgia 12/09/2019   PCP:  Cleatus Donovan RAMAN, MD Pharmacy:   [facility] [phone] [address], [place] - [address] AT [facility] [address] & [facility] [place] [address] Phone: [phone] Fax: [phone]  [facility] [address] [place] - [address] [place] [address] Phone: [phone] Fax: [phone]  [facility] [address] [place] [address] Phone: [phone] Fax: [phone]  [facility] [address] [place] [address] Phone: [phone] Fax: [phone]  [facility] [address] [place] [address] Phone: [phone] Fax: [phone]     Social Drivers of Health (SDOH) Social History: SDOH Screenings   Food Insecurity: No Food Insecurity (09/07/2024)  Housing: Low Risk (09/07/2024)  Transportation Needs: No Transportation Needs (09/07/2024)  Utilities: Not At Risk (09/07/2024)  Depression (PHQ2-9): Low Risk (09/19/2022)  Financial Resource Strain: Low Risk (11/14/2023)   Received from [facility]  Social Connections: Unknown (12/19/2021)   Received from [facility]  Tobacco Use: Low Risk (09/07/2024)   SDOH Interventions:     Readmission Risk Interventions     No data to display

## 2024-09-11 ENCOUNTER — Other Ambulatory Visit (HOSPITAL_COMMUNITY): Payer: Self-pay

## 2024-09-11 LAB — BASIC METABOLIC PANEL WITH GFR
Anion gap: 12 (ref 5–15)
BUN: 56 mg/dL — ABNORMAL HIGH (ref 6–20)
CO2: 24 mmol/L (ref 22–32)
Calcium: 10.1 mg/dL (ref 8.9–10.3)
Chloride: 102 mmol/L (ref 98–111)
Creatinine, Ser: 7.79 mg/dL — ABNORMAL HIGH (ref 0.44–1.00)
GFR, Estimated: 6 mL/min — ABNORMAL LOW
Glucose, Bld: 81 mg/dL (ref 70–99)
Potassium: 4.7 mmol/L (ref 3.5–5.1)
Sodium: 138 mmol/L (ref 135–145)

## 2024-09-11 LAB — CBC
HCT: 29 % — ABNORMAL LOW (ref 36.0–46.0)
Hemoglobin: 9.1 g/dL — ABNORMAL LOW (ref 12.0–15.0)
MCH: 31.7 pg (ref 26.0–34.0)
MCHC: 31.4 g/dL (ref 30.0–36.0)
MCV: 101 fL — ABNORMAL HIGH (ref 80.0–100.0)
Platelets: 228 10*3/uL (ref 150–400)
RBC: 2.87 MIL/uL — ABNORMAL LOW (ref 3.87–5.11)
RDW: 13.3 % (ref 11.5–15.5)
WBC: 7.2 10*3/uL (ref 4.0–10.5)
nRBC: 0 % (ref 0.0–0.2)

## 2024-09-11 LAB — TROPONIN T, HIGH SENSITIVITY: Troponin T High Sensitivity: 387 ng/L (ref 0–19)

## 2024-09-11 MED ORDER — CARVEDILOL 3.125 MG PO TABS
3.1250 mg | ORAL_TABLET | Freq: Two times a day (BID) | ORAL | 1 refills | Status: AC
  Filled 2024-09-11: qty 60, 30d supply, fill #0

## 2024-09-11 MED ORDER — HEPARIN SODIUM (PORCINE) 1000 UNIT/ML IJ SOLN
3200.0000 [IU] | Freq: Once | INTRAMUSCULAR | Status: AC
  Administered 2024-09-11: 3200 [IU]

## 2024-09-11 MED ORDER — BUDESONIDE-FORMOTEROL FUMARATE 80-4.5 MCG/ACT IN AERO
2.0000 | INHALATION_SPRAY | Freq: Two times a day (BID) | RESPIRATORY_TRACT | 12 refills | Status: AC
  Filled 2024-09-11: qty 10.2, 30d supply, fill #0

## 2024-09-11 MED ORDER — HEPARIN SODIUM (PORCINE) 1000 UNIT/ML IJ SOLN
INTRAMUSCULAR | Status: AC
  Filled 2024-09-11: qty 4

## 2024-09-11 NOTE — Progress Notes (Addendum)
 Informed Evelyn RN that I messaged Dr. Dorinda about patient showing ST Depression on EKG in dialysis.  Brooke Brasil, LPN [facility]

## 2024-09-11 NOTE — Plan of Care (Signed)

## 2024-09-11 NOTE — Progress Notes (Signed)
 Received patient in bed to unit.  Alert and oriented.  Informed consent signed and in chart.   TX duration:3.5  Patient tolerated well.  Transported back to the room  Alert, without acute distress.  Hand-off given to patient's nurse.   Access used: Right Chest HD cath Access issues: none  Total UF removed: , cramping stopped patient from making goal   09/11/24 1200  Vitals  Temp 97.6 F (36.4 C)  BP (!) 141/91  MAP (mmHg) 107  Pulse Rate 90  ECG Heart Rate 86  Resp 14  Oxygen Therapy  SpO2 100 %  O2 Device Room Air  During Treatment Monitoring  Duration of HD Treatment -hour(s) 3.5 hour(s)  HD Safety Checks Performed Yes  Intra-Hemodialysis Comments Tx completed  Post Treatment  Dialyzer Clearance Lightly streaked  Liters Processed 84  Fluid Removed (mL) 700 mL  Tolerated HD Treatment Yes  Post-Hemodialysis Comments did not make goal due to cramping  Hemodialysis Catheter Right Internal jugular Double lumen Permanent (Tunneled)  Placement Date/Time: 09/07/24 0846   Placed prior to admission: No  Serial / Lot #: [id number]  Expiration Date: 08/08/28  Time Out: Correct patient;Correct site;Correct procedure  Maximum sterile barrier precautions: Large sterile sheet;Hand hygiene;...  Site Condition No complications  Blue Lumen Status Flushed;Antimicrobial dead end cap;Heparin  locked  Red Lumen Status Flushed;Antimicrobial dead end cap;Heparin  locked  Purple Lumen Status N/A  Catheter fill solution Heparin  1000 units/ml  Catheter fill volume (Arterial) 1.6 cc  Catheter fill volume (Venous) 1.6  Dressing Type Transparent  Dressing Status Antimicrobial disc/dressing in place;Clean, Dry, Intact  Drainage Description None  Dressing Change Due 09/14/24  Post treatment catheter status Capped and Clamped     Camellia Brasil LPN [facility] D[facility]

## 2024-09-11 NOTE — Progress Notes (Addendum)
 Contacted [facility] this morning requesting update on pt's schedule at d/c. Awaiting a response. Update provided to nephrologist.   Randine Mungo Dialysis Navigator [phone]  Addendum at 2:51 pm: Pt has been accepted at [facility] on MWF. On Monday only, pt will need to arrive at 9:45 am for 10:50 am chair time. Pt will complete paperwork prior to first treatment. After Monday, pt will need to arrive at 6:10 am for 6:30 am chair time. Met with pt at bedside to discuss arrangements. Pt agreeable to plans and schedule letter provided. Update provided to attending, nephrologist, and pt's RN. HD arrangements added to AVS as well. Renal NP aware clinic will need orders at d/c. Clinic aware to expect pt on Monday.

## 2024-09-11 NOTE — Progress Notes (Signed)
 " Progress Note   Patient: Brooke Baldwin FMW:[id number] DOB: 05-Aug-1976 DOA: 09/04/2024     6 DOS: the patient was seen and examined on 09/11/2024     Brief hospital course: [name] is an 49 y.o. 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  In the ED blood pressure was 183/114, with a potassium of 5.7 CO2 of 14 creatinine of 12 cardiac biomarkers in the 1400s, chest x-ray showed small left-sided pleural effusion, he was given Lokelma  aspirin  and started on IV heparin  drip.    Assessment and Plan:    Elevated troponin/Atypical chest pain: Twelve-lead EKG showed normal sinus rhythm no ST segment abnormalities, cardiac biomarkers troponins of 1500. Initially cardiology thought this was more pleurisy and ACS was unlikely however on 09/11/2024 patient developed another right-sided chest pain with diffuse T wave inversion, I re-engage cardiologist and discussed the case with Dr. Annabella Scarce today. Cardiologist will reevaluate patient for possible cardiac cath on Monday  2D echo showed no effusion Continue to monitor on telemetry   Hypertensive urgency: She says she ran out of her blood pressure medications about 3 weeks. Continue Coreg  and amlodipine  Monitor blood pressure   Chronic kidney disease stage V/metabolic acidosis with increased anion gap and accumulation of organic acids: Been followed by [facility] for about 2 months she has been resistant to permanent accessed. Has been noncompliant with her follow-ups and appointment with nephrology as an outpatient. She agreed to temporary tunneled catheter to be placed on 09/07/2024. Hemodialysis was started on 2-3 -2026   Hyperkalemia: Resolved with oral Lokelma  and hemodialysis.   Hyperphosphatemia: Continue phosphate  binders, phosphorus is coming down. Further management per nephrology.   Hypertensive urgency: Continue Coreg , amlodipine  and IV hydralazine  as needed.   Anemia of chronic renal disease: Monitor CBC closely   Vitamin D  deficiency: Started on replacement.   HIV (human immunodeficiency virus infection) (HCC) Resume her home medications, the patient refused switching to Dovato  she was continue Biktarvy  despite knowing the risk because of her renal dysfunction     DVT prophylaxis: Lovenox Family Communication:none Status is: Inpatient Remains inpatient appropriate because: chest pain       Subjective:  Seen and examined at bedside this morning Denies any acute overnight complaints Cardiologist planning cardiac cath on Monday   Physical Exam:   General exam: In no acute distress. Respiratory system: Good air movement and clear to auscultation. Cardiovascular system: S1 & S2 heard, RRR. No JVD. Gastrointestinal system: Abdomen is nondistended, soft and nontender.  Extremities: No pedal edema. Skin: No rashes, lesions or ulcers Psychiatry: Judgement and insight appear normal. Mood & affect appropriate.   Data Reviewed:   Vitals:   09/11/24 1200 09/11/24 1209 09/11/24 1254 09/11/24 1549  BP: (!) 141/91 (!) 145/88 (!) 140/90 (!) 147/89  Pulse: 90 89    Resp: 14 17  17   Temp: 97.6 F (36.4 C)   98.8 F (37.1 C)  TempSrc: Oral   Oral  SpO2: 100% 100%    Weight:      Height:          Latest Ref Rng & Units 09/11/2024    4:29 AM 09/10/2024    4:43 AM 09/09/2024    5:06 AM  CBC  WBC 4.0 - 10.5 K/uL 7.2  6.8  5.8   Hemoglobin 12.0 -  15.0 g/dL 9.1  9.5  8.8   Hematocrit 36.0 - 46.0 % 29.0  30.0  27.9   Platelets 150 - 400 K/uL 228  233  228        Latest Ref Rng & Units 09/11/2024    4:29 AM 09/10/2024    4:43 AM 09/09/2024    7:16 PM  BMP  Glucose 70 - 99 mg/dL 81  88  841   BUN 6 - 20 mg/dL 56  44  34   Creatinine 0.44 - 1.00 mg/dL 2.20  3.92  4.50   Sodium 135 - 145  mmol/L 138  138  135   Potassium 3.5 - 5.1 mmol/L 4.7  4.6  4.0   Chloride 98 - 111 mmol/L 102  100  97   CO2 22 - 32 mmol/L 24  28  29    Calcium  8.9 - 10.3 mg/dL 89.8  89.9  9.6      Author: Drue ONEIDA Potter, MD 09/11/2024 4:45 PM  For on call review [email or web address].  "

## 2024-09-11 NOTE — Procedures (Signed)
 I was present at this dialysis session. I have reviewed the session itself and made appropriate changes.   Patient feels well tolerating dialysis.  Planning to discharge today if dialysis can be set up to start on Monday.  Filed Weights   09/10/24 0531 09/11/24 0509 09/11/24 0817  Weight: 76.3 kg 76.5 kg 78.8 kg    Recent Labs  Lab 09/09/24 1916 09/10/24 0443 09/11/24 0429  NA 135   < > 138  K 4.0   < > 4.7  CL 97*   < > 102  CO2 29   < > 24  GLUCOSE 158*   < > 81  BUN 34*   < > 56*  CREATININE 5.49*   < > 7.79*  CALCIUM  9.6   < > 10.1  PHOS 2.3*  --   --    < > = values in this interval not displayed.    Recent Labs  Lab 09/05/24 0921 09/06/24 0227 09/09/24 0506 09/10/24 0443 09/11/24 0429  WBC 6.3   < > 5.8 6.8 7.2  NEUTROABS 4.1  --   --   --   --   HGB 9.1*   < > 8.8* 9.5* 9.1*  HCT 29.2*   < > 27.9* 30.0* 29.0*  MCV 100.0   < > 98.9 99.3 101.0*  PLT 233   < > 228 233 228   < > = values in this interval not displayed.    Scheduled Meds:  amLODipine   10 mg Oral Daily   bictegravir-emtricitabine -tenofovir  AF  1 tablet Oral Daily   calcium  carbonate  800 mg of elemental calcium  Oral TID WC   carvedilol   3.125 mg Oral BID WC   Chlorhexidine  Gluconate Cloth  6 each Topical Q0600   cholecalciferol   2,000 Units Oral Daily   feeding supplement (NEPRO CARB STEADY)  237 mL Oral BID BM   ferric citrate   420 mg Oral TID WC   fluticasone  furoate-vilanterol  1 puff Inhalation Daily   heparin  sodium (porcine)  3,200 Units Intracatheter Once   multivitamin  1 tablet Oral QHS   sodium chloride  flush  3 mL Intravenous Q12H   Continuous Infusions: PRN Meds:.acetaminophen , albuterol , hydrALAZINE , HYDROcodone -acetaminophen , morphine  injection, nitroGLYCERIN , ondansetron  (ZOFRAN ) IV   Jayson Player,  MD 09/11/2024, 10:48 AM

## 2024-09-11 NOTE — Progress Notes (Signed)
 TRH night cross cover note:   I was notified by the patient's RN of the patient's updated troponin this evening of 387, which is down from most recent prior troponin of 1585 when checked earlier during this hospitalization on 09/05/24.  While the patient is reported to have experienced some chest discomfort earlier in this hospitalization, I have confirmed with the patient's RN that the patient is currently chest pain-free.  Per brief chart review, cardiology is following and it looks like they are planning on taking her for cath on Monday. In the setting of this down-trending troponin in patient that is currently chest pain free, with plan for cath on Monday, will continue to monitor, including for development of any ensuing chest pain.    Eva Pore, DO Hospitalist

## 2024-09-11 NOTE — Progress Notes (Signed)
 "  Rounding Note   Patient Name: Brooke Baldwin Date of Encounter: 09/11/2024  [facility] Cardiologist: Dub Huntsman, DO   Subjective Feeling OK.  Pain in her R breast.  Feels like a pinching pain.   Scheduled Meds:  amLODipine   10 mg Oral Daily   bictegravir-emtricitabine -tenofovir  AF  1 tablet Oral Daily   calcium  carbonate  800 mg of elemental calcium  Oral TID WC   carvedilol   3.125 mg Oral BID WC   Chlorhexidine  Gluconate Cloth  6 each Topical Q0600   cholecalciferol   2,000 Units Oral Daily   feeding supplement (NEPRO CARB STEADY)  237 mL Oral BID BM   ferric citrate   420 mg Oral TID WC   fluticasone  furoate-vilanterol  1 puff Inhalation Daily   multivitamin  1 tablet Oral QHS   sodium chloride  flush  3 mL Intravenous Q12H   Continuous Infusions:  PRN Meds: acetaminophen , albuterol , hydrALAZINE , HYDROcodone -acetaminophen , morphine  injection, nitroGLYCERIN , ondansetron  (ZOFRAN ) IV   Vital Signs  Vitals:   09/11/24 1200 09/11/24 1209 09/11/24 1254 09/11/24 1549  BP: (!) 141/91 (!) 145/88 (!) 140/90 (!) 147/89  Pulse: 90 89    Resp: 14 17  17   Temp: 97.6 F (36.4 C)   98.8 F (37.1 C)  TempSrc: Oral   Oral  SpO2: 100% 100%    Weight:      Height:        Intake/Output Summary (Last 24 hours) at 09/11/2024 1722 Last data filed at 09/11/2024 1200 Gross per 24 hour  Intake --  Output 700 ml  Net -700 ml      09/11/2024    8:17 AM 09/11/2024    5:09 AM 09/10/2024    5:31 AM  Last 3 Weights  Weight (lbs) 173 lb 11.6 oz 168 lb 11.2 oz 168 lb 4.8 oz  Weight (kg) 78.8 kg 76.522 kg 76.34 kg      Telemetry Sinus rhythm.  Sinus tachycardia.   - Personally Reviewed  ECG  Sinus rhyhtjm.  Rate 91 bpm.  Anterolateral T wave inversion - Personally Reviewed  Physical Exam  VS:  BP (!) 147/89 (BP Location: Right Arm)   Pulse 89   Temp 98.8 F (37.1 C) (Oral)   Resp 17   Ht 5' 2 (1.575 m)   Wt 78.8 kg   LMP 04/02/2015   SpO2 100%   BMI 31.77 kg/m  ,  BMI Body mass index is 31.77 kg/m. GENERAL:  Well appearing HEENT: Pupils equal round and reactive, fundi not visualized, oral mucosa unremarkable NECK:  No jugular venous distention, waveform within normal limits, carotid upstroke brisk and symmetric, no bruits, no thyromegaly LUNGS:  Clear to auscultation bilaterally HEART:  RRR.  PMI not displaced or sustained,S1 and S2 within normal limits, no S3, no S4, no clicks, no rubs, no murmurs ABD:  Flat, positive bowel sounds normal in frequency in pitch, no bruits, no rebound, no guarding, no midline pulsatile mass, no hepatomegaly, no splenomegaly EXT:  2 plus pulses throughout, no edema, no cyanosis no clubbing SKIN:  No rashes no nodules NEURO:  Cranial nerves II through XII grossly intact, motor grossly intact throughout PSYCH:  Cognitively intact, oriented to person place and time   Labs High Sensitivity Troponin:  No results for input(s): TROPONINIHS in the last 720 hours.  Recent Labs  Lab 09/04/24 2238 09/05/24 0038 09/05/24 0921  TRNPT 1,367* 1,408* 1,585*       Chemistry Recent Labs  Lab 09/05/24 9078 09/05/24 9077  09/06/24 0227 09/07/24 1125 09/09/24 1916 09/10/24 0443 09/11/24 0429  NA  --    < > 141 143 135 138 138  K  --    < > 4.7 4.3 4.0 4.6 4.7  CL  --    < > 109 109 97* 100 102  CO2  --    < > 12* 20* 29 28 24   GLUCOSE  --    < > 120* 92 158* 88 81  BUN  --    < > 92* 87* 34* 44* 56*  CREATININE  --    < > 11.70* 11.80* 5.49* 6.07* 7.79*  CALCIUM   --    < > 9.1 9.3 9.6 10.0 10.1  PROT 7.0  --   --   --   --   --   --   ALBUMIN  3.5  --  3.2* 3.5 3.5  --   --   AST 21  --   --   --   --   --   --   ALT 12  --   --   --   --   --   --   ALKPHOS 112  --   --   --   --   --   --   BILITOT 0.4  --   --   --   --   --   --   GFRNONAA  --    < > 4* 4* 9* 8* 6*  ANIONGAP  --    < > 20* 14 9 10 12    < > = values in this interval not displayed.    Lipids  Recent Labs  Lab 09/08/24 0457  CHOL 173  TRIG 116   HDL 67  LDLCALC 83  CHOLHDL 2.6    Hematology Recent Labs  Lab 09/09/24 0506 09/10/24 0443 09/11/24 0429  WBC 5.8 6.8 7.2  RBC 2.82* 3.02* 2.87*  HGB 8.8* 9.5* 9.1*  HCT 27.9* 30.0* 29.0*  MCV 98.9 99.3 101.0*  MCH 31.2 31.5 31.7  MCHC 31.5 31.7 31.4  RDW 13.5 13.4 13.3  PLT 228 233 228   Thyroid  No results for input(s): TSH, FREET4 in the last 168 hours.  BNP Recent Labs  Lab 09/05/24 0048  PROBNP >35,000.0*    DDimer No results for input(s): DDIMER in the last 168 hours.   Radiology  No results found.  Cardiac Studies Echo 09/05/24: 1. Left ventricular ejection fraction, by estimation, is 50 to 55%. The  left ventricle has low normal function. The left ventricle demonstrates  global hypokinesis. Left ventricular diastolic parameters were normal.   2. Right ventricular systolic function is normal. The right ventricular  size is normal.   3. The mitral valve is normal in structure. Mild to moderate mitral valve  regurgitation. No evidence of mitral stenosis.   4. The aortic valve is normal in structure. Aortic valve regurgitation is  not visualized. No aortic stenosis is present.   5. The inferior vena cava is normal in size with greater than 50%  respiratory variability, suggesting right atrial pressure of 3 mmHg.    Patient Profile   Brooke Baldwin is a 89F with HIV, ESRD 2/2 FSGS, admitted with hypertensive emergency in the setting of missed HD and non adherence with medication. Cardiology consulted for chest pain and elevated troponin.   Assessment & Plan    # Elevated troponin: hs-troponin increased to 1585.  This was thought to be dmand ischemia in the  setting of hypertensive emergency.  Also some thought about pericarditis given elevated ESR and CRP.  However symptoms resolved.  Today she was noted to have ST depression with HD.  Post dialysis EKG showed new anterolateral TWI.  She has atypical pain in her right breast.  Given her multiple risk  factors, elevated troponin and EKG changes, the safest thing is to pursue LHC.  We will plan to do this on Monday.     LDL was 142 on 11/2023.  It was 83 this admission.    Risks and benefits of cardiac catheterization have been discussed with the patient.  The patient understands that risks included but are not limited to stroke (1 in 1000), death (1 in 1000), kidney failure [usually temporary] (1 in 500), bleeding (1 in 200), allergic reaction [possibly serious] (1 in 200). The patient understands and agrees to proceed.    # Hypertensive emergency:  BP better controlled.  Continue amlodipine  and carvedilol .     For questions or updates, please contact [facility] Please consult [email or web address] for contact info under       Signed, Annabella Scarce, MD  09/11/2024, 5:22 PM    "

## 2024-10-13 ENCOUNTER — Ambulatory Visit: Admitting: Emergency Medicine
